# Patient Record
Sex: Female | Born: 1955 | Race: White | Hispanic: No | Smoking: Former smoker
Health system: Southern US, Community
[De-identification: ages and names within clinical notes are randomized; demographics above are authoritative.]

## PROBLEM LIST (undated history)

## (undated) DIAGNOSIS — I4819 Other persistent atrial fibrillation: Secondary | ICD-10-CM

## (undated) DIAGNOSIS — N390 Urinary tract infection, site not specified: Secondary | ICD-10-CM

## (undated) DIAGNOSIS — G2581 Restless legs syndrome: Secondary | ICD-10-CM

## (undated) DIAGNOSIS — I10 Essential (primary) hypertension: Secondary | ICD-10-CM

## (undated) DIAGNOSIS — E7800 Pure hypercholesterolemia, unspecified: Secondary | ICD-10-CM

## (undated) DIAGNOSIS — I340 Nonrheumatic mitral (valve) insufficiency: Secondary | ICD-10-CM

## (undated) DIAGNOSIS — Z8489 Family history of other specified conditions: Secondary | ICD-10-CM

## (undated) DIAGNOSIS — I2510 Atherosclerotic heart disease of native coronary artery without angina pectoris: Secondary | ICD-10-CM

## (undated) DIAGNOSIS — N189 Chronic kidney disease, unspecified: Secondary | ICD-10-CM

## (undated) DIAGNOSIS — I341 Nonrheumatic mitral (valve) prolapse: Secondary | ICD-10-CM

## (undated) DIAGNOSIS — N809 Endometriosis, unspecified: Secondary | ICD-10-CM

## (undated) HISTORY — PX: US ECHOCARDIOGRAPHY: HXRAD669

## (undated) HISTORY — PX: KNEE SURGERY: SHX244

## (undated) HISTORY — DX: Other persistent atrial fibrillation: I48.19

## (undated) HISTORY — PX: LAPAROSCOPY: SHX197

## (undated) HISTORY — DX: Restless legs syndrome: G25.81

## (undated) HISTORY — DX: Nonrheumatic mitral (valve) insufficiency: I34.0

## (undated) HISTORY — PX: APPENDECTOMY: SHX54

## (undated) HISTORY — DX: Endometriosis, unspecified: N80.9

## (undated) HISTORY — PX: CHOLECYSTECTOMY: SHX55

## (undated) HISTORY — PX: ABDOMINAL HYSTERECTOMY: SHX81

## (undated) HISTORY — DX: Urinary tract infection, site not specified: N39.0

---

## 1997-12-14 ENCOUNTER — Emergency Department (HOSPITAL_COMMUNITY): Admission: EM | Admit: 1997-12-14 | Discharge: 1997-12-14 | Payer: Self-pay | Admitting: Emergency Medicine

## 1998-03-26 ENCOUNTER — Ambulatory Visit (HOSPITAL_BASED_OUTPATIENT_CLINIC_OR_DEPARTMENT_OTHER): Admission: RE | Admit: 1998-03-26 | Discharge: 1998-03-26 | Payer: Self-pay | Admitting: Orthopedic Surgery

## 1998-08-20 ENCOUNTER — Ambulatory Visit (HOSPITAL_BASED_OUTPATIENT_CLINIC_OR_DEPARTMENT_OTHER): Admission: RE | Admit: 1998-08-20 | Discharge: 1998-08-20 | Payer: Self-pay | Admitting: Orthopedic Surgery

## 2001-12-05 ENCOUNTER — Ambulatory Visit (HOSPITAL_COMMUNITY): Admission: RE | Admit: 2001-12-05 | Discharge: 2001-12-05 | Payer: Self-pay | Admitting: Family Medicine

## 2001-12-05 ENCOUNTER — Encounter: Payer: Self-pay | Admitting: Family Medicine

## 2002-12-17 ENCOUNTER — Encounter: Payer: Self-pay | Admitting: Emergency Medicine

## 2002-12-17 ENCOUNTER — Emergency Department (HOSPITAL_COMMUNITY): Admission: EM | Admit: 2002-12-17 | Discharge: 2002-12-17 | Payer: Self-pay | Admitting: Emergency Medicine

## 2003-09-11 ENCOUNTER — Ambulatory Visit (HOSPITAL_BASED_OUTPATIENT_CLINIC_OR_DEPARTMENT_OTHER): Admission: RE | Admit: 2003-09-11 | Discharge: 2003-09-11 | Payer: Self-pay | Admitting: Orthopedic Surgery

## 2003-10-11 ENCOUNTER — Encounter: Admission: RE | Admit: 2003-10-11 | Discharge: 2004-01-09 | Payer: Self-pay | Admitting: Family Medicine

## 2004-09-01 ENCOUNTER — Encounter: Admission: RE | Admit: 2004-09-01 | Discharge: 2004-09-01 | Payer: Self-pay | Admitting: Family Medicine

## 2004-09-04 ENCOUNTER — Encounter: Admission: RE | Admit: 2004-09-04 | Discharge: 2004-09-04 | Payer: Self-pay | Admitting: Family Medicine

## 2004-09-09 ENCOUNTER — Encounter: Admission: RE | Admit: 2004-09-09 | Discharge: 2004-09-09 | Payer: Self-pay | Admitting: Family Medicine

## 2006-09-08 ENCOUNTER — Ambulatory Visit: Payer: Self-pay | Admitting: Cardiology

## 2006-09-08 LAB — CONVERTED CEMR LAB
AST: 28 units/L (ref 0–37)
Albumin: 4.4 g/dL (ref 3.5–5.2)
Alkaline Phosphatase: 84 units/L (ref 39–117)
BUN: 8 mg/dL (ref 6–23)
CO2: 31 meq/L (ref 19–32)
Calcium: 10.4 mg/dL (ref 8.4–10.5)
Chloride: 102 meq/L (ref 96–112)
Creatinine, Ser: 1.2 mg/dL (ref 0.4–1.2)
Glucose, Bld: 121 mg/dL — ABNORMAL HIGH (ref 70–99)
Hgb A1c MFr Bld: 5.7 % (ref 4.6–6.0)
MCHC: 33.6 g/dL (ref 30.0–36.0)
Potassium: 4 meq/L (ref 3.5–5.1)
RBC: 5.04 M/uL (ref 3.87–5.11)
RDW: 12.7 % (ref 11.5–14.6)
TSH: 1.25 microintl units/mL (ref 0.35–5.50)
Total Bilirubin: 0.8 mg/dL (ref 0.3–1.2)
Total CHOL/HDL Ratio: 7.3
Triglycerides: 409 mg/dL (ref 0–149)
VLDL: 82 mg/dL — ABNORMAL HIGH (ref 0–40)

## 2006-09-16 ENCOUNTER — Ambulatory Visit: Payer: Self-pay

## 2006-09-16 ENCOUNTER — Encounter: Payer: Self-pay | Admitting: Cardiology

## 2006-09-20 ENCOUNTER — Ambulatory Visit: Payer: Self-pay | Admitting: Cardiology

## 2006-09-20 LAB — CONVERTED CEMR LAB: Pro B Natriuretic peptide (BNP): 16 pg/mL (ref 0.0–100.0)

## 2006-10-03 ENCOUNTER — Ambulatory Visit: Payer: Self-pay | Admitting: Cardiology

## 2006-10-03 ENCOUNTER — Ambulatory Visit: Payer: Self-pay | Admitting: Critical Care Medicine

## 2006-10-03 ENCOUNTER — Inpatient Hospital Stay (HOSPITAL_COMMUNITY): Admission: AD | Admit: 2006-10-03 | Discharge: 2006-10-06 | Payer: Self-pay | Admitting: Cardiology

## 2006-10-05 ENCOUNTER — Encounter: Payer: Self-pay | Admitting: Cardiology

## 2006-10-10 ENCOUNTER — Ambulatory Visit: Payer: Self-pay | Admitting: Cardiology

## 2006-10-28 ENCOUNTER — Ambulatory Visit: Payer: Self-pay | Admitting: Cardiology

## 2006-10-28 ENCOUNTER — Emergency Department (HOSPITAL_COMMUNITY): Admission: EM | Admit: 2006-10-28 | Discharge: 2006-10-28 | Payer: Self-pay | Admitting: Emergency Medicine

## 2006-11-07 ENCOUNTER — Ambulatory Visit: Payer: Self-pay | Admitting: Cardiology

## 2006-12-07 ENCOUNTER — Encounter: Admission: RE | Admit: 2006-12-07 | Discharge: 2006-12-07 | Payer: Self-pay | Admitting: Neurology

## 2006-12-08 ENCOUNTER — Ambulatory Visit: Payer: Self-pay | Admitting: Cardiology

## 2006-12-23 ENCOUNTER — Ambulatory Visit: Payer: Self-pay | Admitting: Cardiology

## 2006-12-23 LAB — CONVERTED CEMR LAB
Albumin: 4.1 g/dL (ref 3.5–5.2)
Alkaline Phosphatase: 76 units/L (ref 39–117)
Bilirubin, Direct: 0.1 mg/dL (ref 0.0–0.3)
LDL Cholesterol: 111 mg/dL — ABNORMAL HIGH (ref 0–99)
Total CHOL/HDL Ratio: 3.5

## 2007-06-04 IMAGING — CR DG CHEST 2V
2 series · 2 of 2 positions shown · non-contrast
Comparison: 10/03/06.

CLINICAL DATA: 50 year old female; shortness of breath, dizziness, lightheadedness and chest pain.
2-VIEW CHEST RADIOGRAPH ? 10/28/06:

[w chest pa]
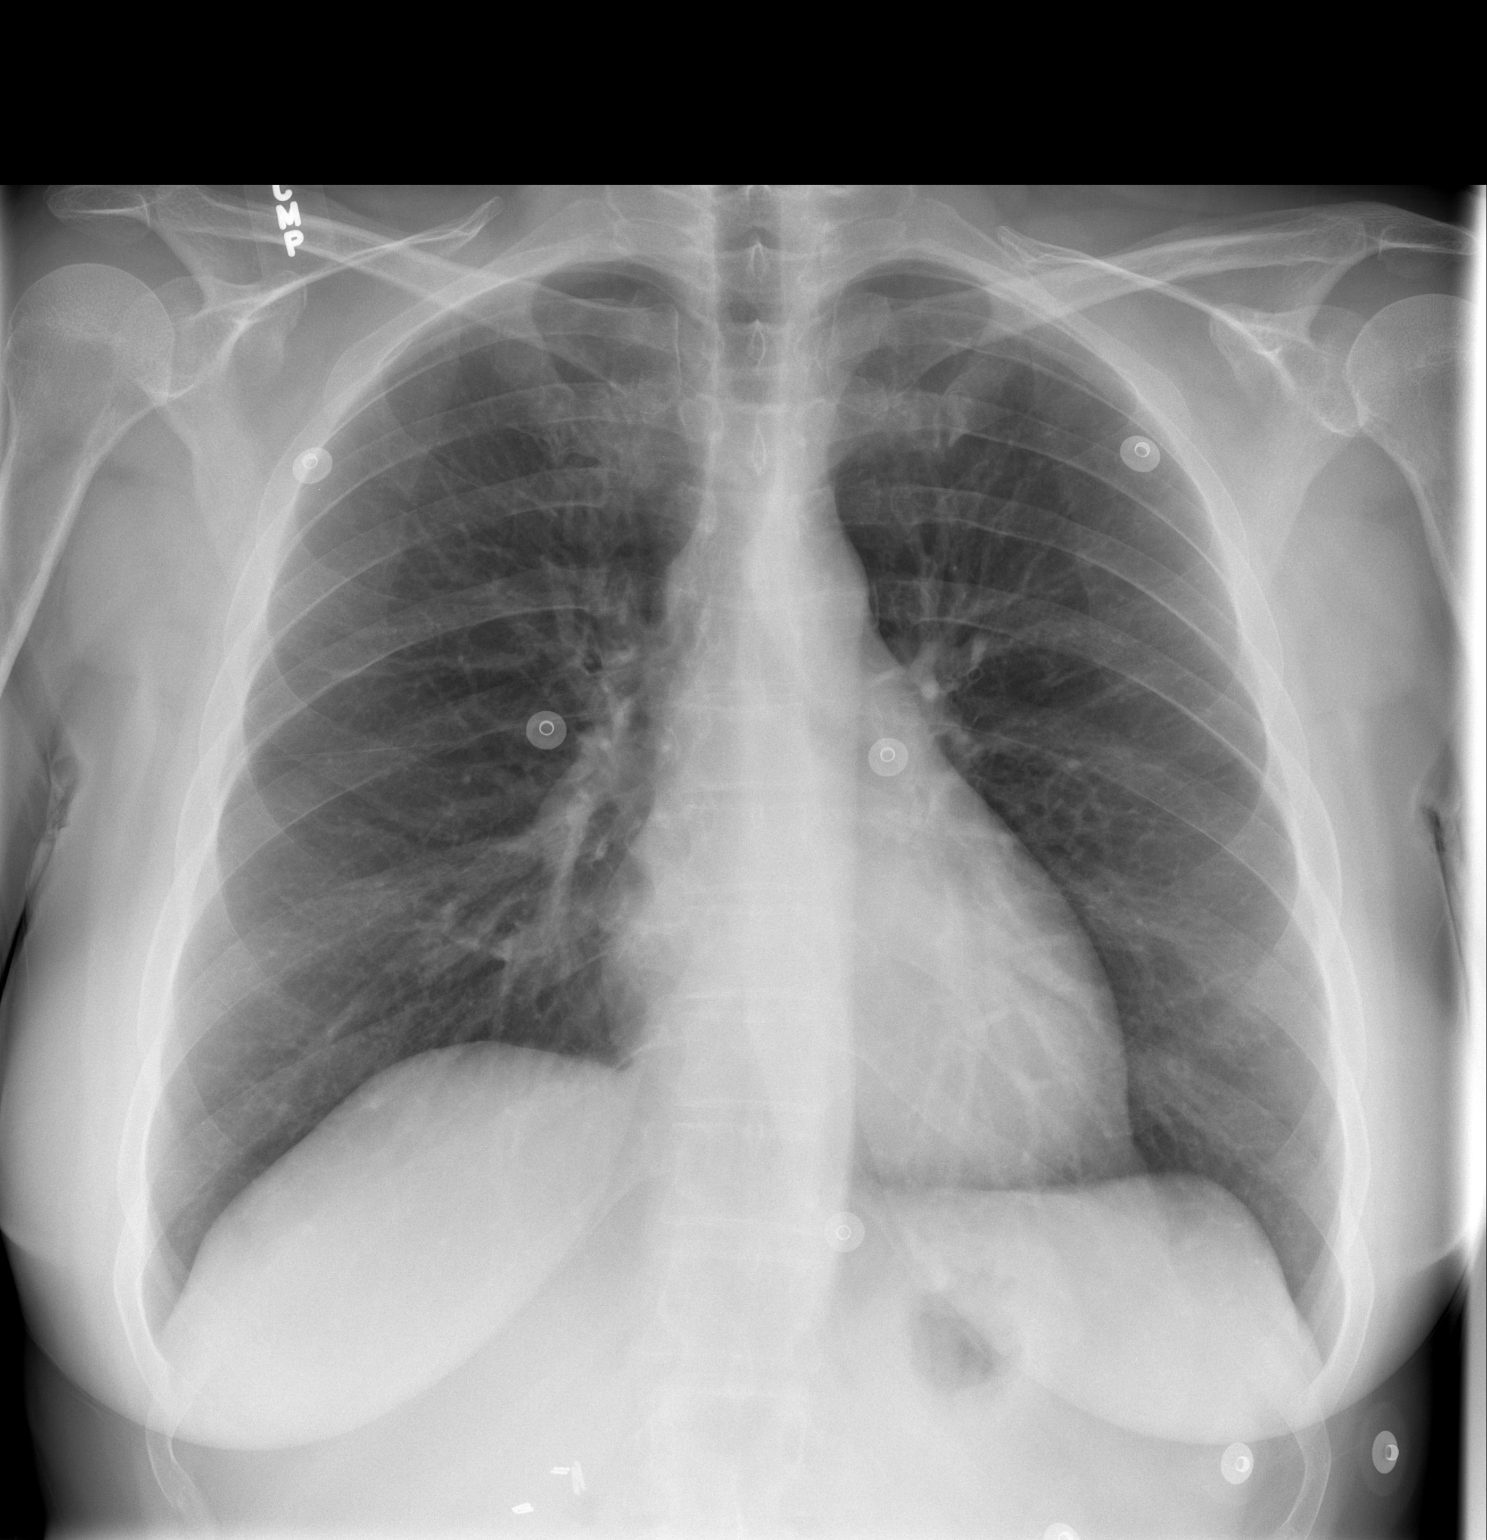

[w chest lat]
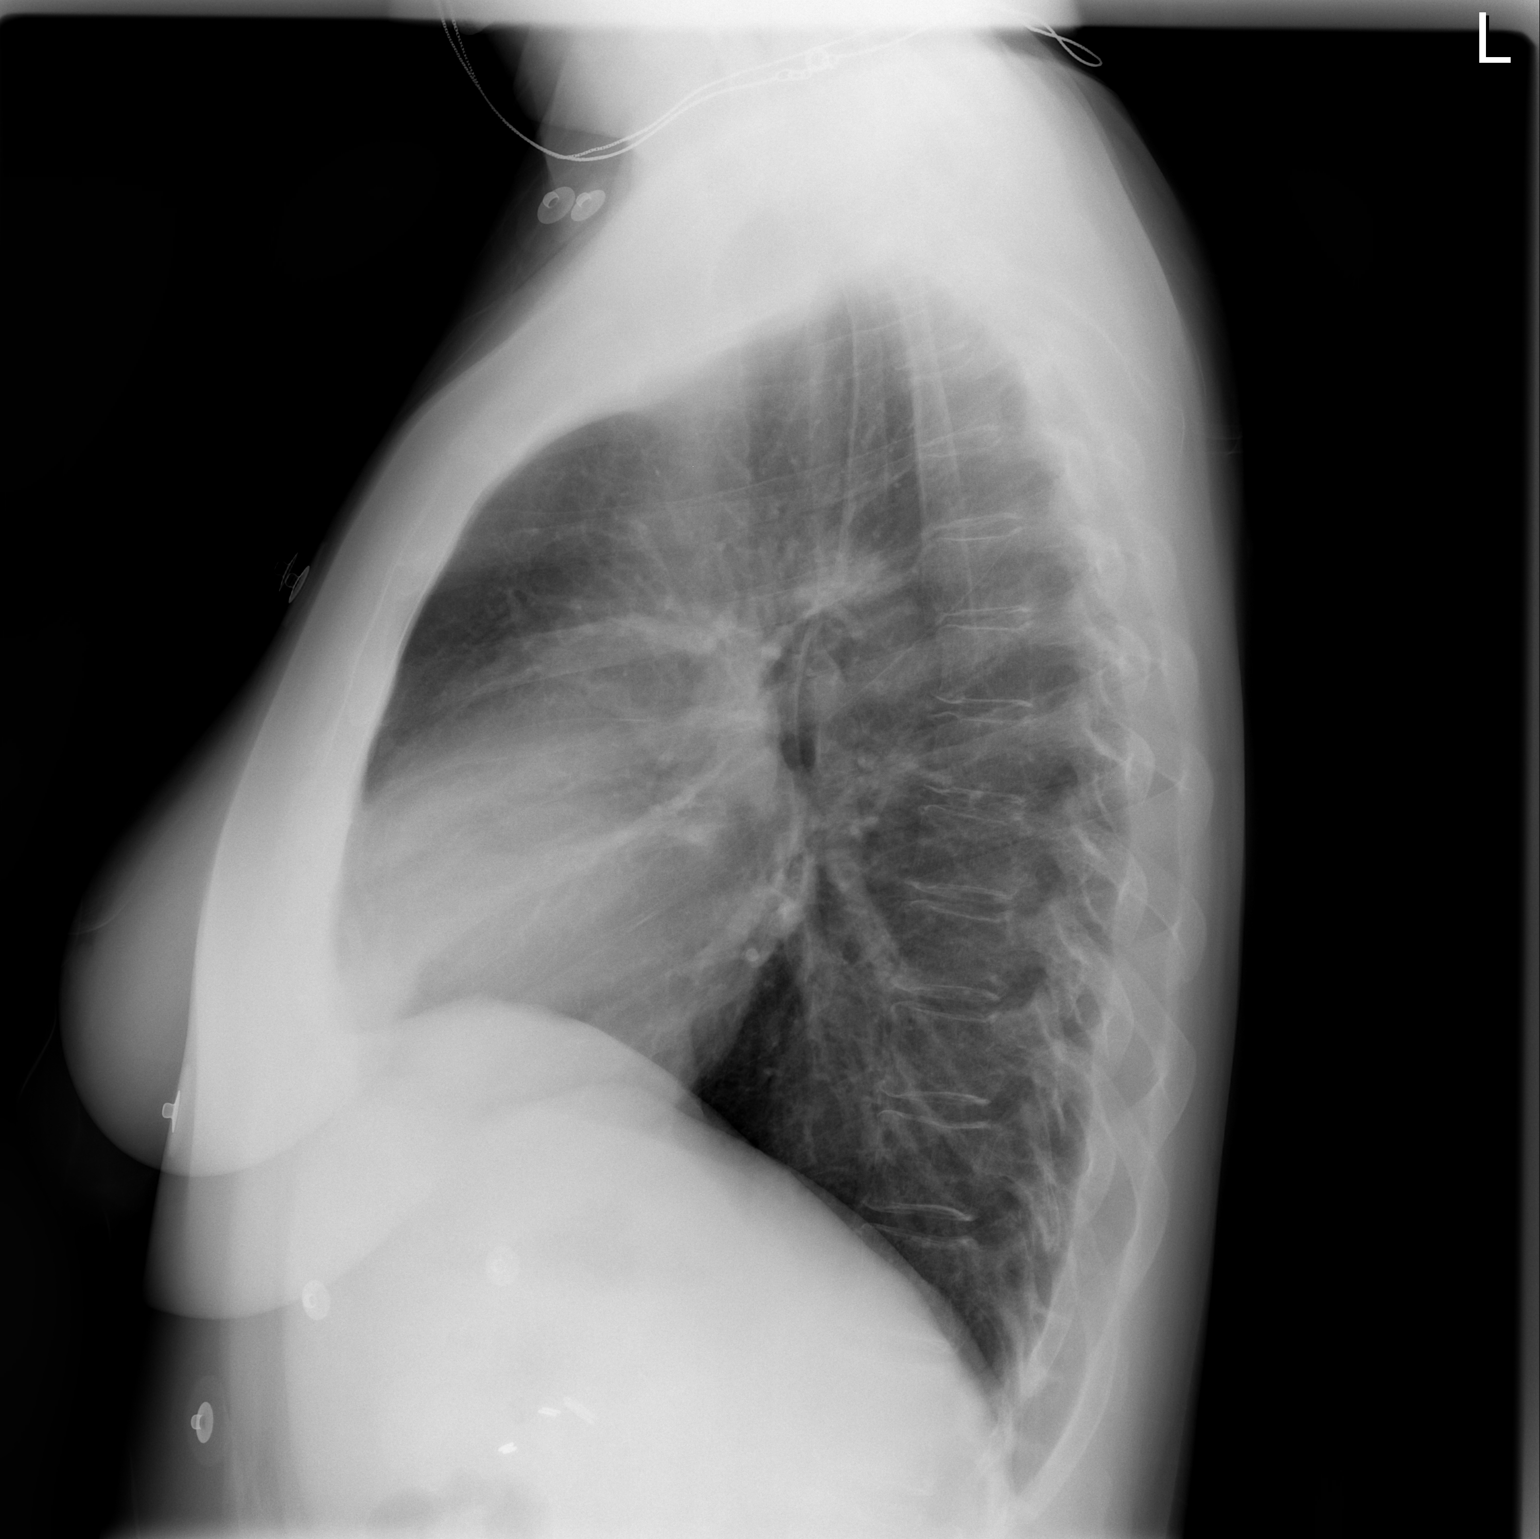

[2 of 2 positions shown; findings below may reference images not displayed]

FINDINGS: The heart size and mediastinal contours are within normal limits.  Both lungs are clear.  The visualized skeletal structures are within normal limits.
IMPRESSION: No active cardiopulmonary disease.

## 2009-10-28 ENCOUNTER — Ambulatory Visit: Payer: Self-pay | Admitting: Cardiology

## 2009-10-28 ENCOUNTER — Ambulatory Visit: Payer: Self-pay | Admitting: Cardiovascular Disease

## 2009-10-28 ENCOUNTER — Encounter: Payer: Self-pay | Admitting: Cardiology

## 2009-10-28 ENCOUNTER — Ambulatory Visit: Payer: Self-pay

## 2009-10-28 ENCOUNTER — Telehealth: Payer: Self-pay | Admitting: Cardiology

## 2009-10-28 ENCOUNTER — Ambulatory Visit (HOSPITAL_COMMUNITY): Admission: RE | Admit: 2009-10-28 | Discharge: 2009-10-28 | Payer: Self-pay | Admitting: Cardiology

## 2009-10-28 ENCOUNTER — Telehealth (INDEPENDENT_AMBULATORY_CARE_PROVIDER_SITE_OTHER): Payer: Self-pay | Admitting: *Deleted

## 2009-10-28 DIAGNOSIS — I2510 Atherosclerotic heart disease of native coronary artery without angina pectoris: Secondary | ICD-10-CM | POA: Insufficient documentation

## 2009-10-28 DIAGNOSIS — R072 Precordial pain: Secondary | ICD-10-CM | POA: Insufficient documentation

## 2009-10-28 DIAGNOSIS — I059 Rheumatic mitral valve disease, unspecified: Secondary | ICD-10-CM | POA: Insufficient documentation

## 2009-11-12 ENCOUNTER — Ambulatory Visit: Payer: Self-pay | Admitting: Cardiology

## 2009-11-13 ENCOUNTER — Telehealth: Payer: Self-pay | Admitting: Cardiology

## 2010-01-09 ENCOUNTER — Emergency Department (HOSPITAL_COMMUNITY): Admission: EM | Admit: 2010-01-09 | Discharge: 2010-01-09 | Payer: Self-pay | Admitting: Emergency Medicine

## 2010-09-24 NOTE — Progress Notes (Signed)
Summary: med question  Phone Note Call from Patient Call back at [phone]   Caller: Patient Reason for Call: Talk to Nurse Summary of Call: does she need to be on nitroglycerine, if so [facility] [address] Initial call taken by: Migdalia Dk,  November 13, 2009 10:46 AM    New/Updated Medications: NITROSTAT 0.4 MG SUBL (NITROGLYCERIN) as directed Prescriptions: NITROSTAT 0.4 MG SUBL (NITROGLYCERIN) as directed  #25 x 4   Entered by:   Scherrie Bateman, LPN   Authorized by:   Gaylord Shih, MD, [facility]   Signed by:   Scherrie Bateman, LPN on 45/40/9811   Method used:   [facility]        [facility]  [address] [phone]* (retail)       [address]       [place], [place]  [id number]       Ph: [phone] or [phone]       Fax: [phone]   RxID:   [id number]

## 2010-09-24 NOTE — Assessment & Plan Note (Signed)
Summary: [facility]   History of Present Illness: Ms Buis comes in today as an adult evaluation of chest pain, shortness of breath, and a history of mitral valve prolapse.  She has had this since this past Friday, 5 days ago. She called her primary care this morning and they would not see her. She called me and we worked her in. Quite frankly, she has not been to see her primary care doctor or me in several years.  She is not taking any of her medications because she lost her insurance. She did not consult anybody about generic equivalents.  She began having a substernal chest aching off and on since Friday. She wakes up he is gasping for breath. This is very similar to what she's had in the past.  She denies palpitations, orthopnea, classic PND or peripheral edema.  She is very emotional today. She says she has not slept in days.  She has a history of nonobstructive coronary disease by catheterization a couple years ago.  Allergies: 1)  ! Penicillin  Review of Systems       negative other than history of present illness  Vital Signs:  Patient profile:   55 year old female Pulse rate:   80 / minute BP sitting:   166 / 100  Physical Exam  General:  emotional, teary, red injected eyes. Head:  normocephalic and atraumatic Eyes:  injected sclera Neck:  Neck supple, no JVD. No masses, thyromegaly or abnormal cervical nodes. Lungs:  Clear bilaterally to auscultation and percussion. Heart:  regular rate and rhythm, no systolic murmur consistent with prolapse Msk:  Back normal, normal gait. Muscle strength and tone normal. Pulses:  pulses normal in all 4 extremities Extremities:  No clubbing or cyanosis. Neurologic:  Alert and oriented x 3. Skin:  Intact without lesions or rashes. Psych:  Normal affect.   Problems:  Medical Problems Added: 1)  Dx of Cad, Native Vessel  (ICD-414.01)  EKG  Procedure date:  10/28/2009  Findings:      normal sinus rhythm,  normal EKG  Impression & Recommendations:  Problem # 1:  CHEST PAIN, PRECORDIAL (ICD-786.51) Assessment Deteriorated  This sounds like her presentation in the past. Some of this may be prolapse but I doubt is coronary. Her EKG is normal.  I'll repeat 2-D echocardiogram. With her history of prolapse, and her blood pressure being high today, we'll start metoprolol succinate 50 mg a day.  Her updated medication list for this problem includes:    Metoprolol Succinate 50 Mg Xr24h-tab (Metoprolol succinate) .Marland Kitchen... 1 once daily  Problem # 2:  MITRAL VALVE PROLAPSE (ICD-424.0) Assessment: Unchanged  Her updated medication list for this problem includes:    Metoprolol Succinate 50 Mg Xr24h-tab (Metoprolol succinate) .Marland Kitchen... 1 once daily  Problem # 3:  CAD, NATIVE VESSEL (ICD-414.01) Assessment: Unchanged  Her updated medication list for this problem includes:    Metoprolol Succinate 50 Mg Xr24h-tab (Metoprolol succinate) .Marland Kitchen... 1 once daily

## 2010-09-24 NOTE — Progress Notes (Signed)
Summary: chest tightness  Phone Note Call from Patient Call back at Home Phone [phone]   Caller: Patient Reason for Call: Talk to Nurse Summary of Call: chest tightness for the last several days, has not been taking meds for the last 69yrs; states its not a emergency just to have the nurse call her back Initial call taken by: Migdalia Dk,  October 28, 2009 9:22 AM  Follow-up for Phone Call        I spoke with the pt and she first tried to get an appt with her PCP but they could not see her.  This pt was last seen by Dr Daleen Squibb in 2008.  The pt called c/o chest tightness since Friday that comes and goes.  The pt feels like this is angina related to her valve.   The pt rates this tightness as a 4/10 and "uncomfortable".  The tightness is in the center of her chest and left breast. The pt has "no significant SOB".  The pt has not had any medication in the past 2 years and  has been under a lot of stress.  I will discuss this pt with Dr Daleen Squibb.  Follow-up by: Julieta Gutting, RN, BSN,  October 28, 2009 9:33 AM  Additional Follow-up for Phone Call Additional follow up Details #1::        I spoke with Dr Daleen Squibb and he would like the pt to come into the office for an EKG.  If the pt's EKG is okay then we will schedule an appt for the pt see Dr Daleen Squibb. Pt agrees with plan.  Julieta Gutting, RN, BSN  October 28, 2009 9:53 AM  See nurse visit note.  Additional Follow-up by: Julieta Gutting, RN, BSN,  October 28, 2009 12:31 PM

## 2010-09-24 NOTE — Progress Notes (Signed)
  Phone Note Outgoing Call   Call placed by: Scherrie Bateman, LPN,  October 28, 2009 1:48 PM Call placed to: Patient Summary of Call: DR WALL WISHES TO START METOPROLOL SUCC. 50 MG DAILY FOR PALPITATIONS AND ELEVATED B/P. LEFT MESSAGE FOR PT TO CALL BACK TO INFORM OF ABOVE PLAN. Initial call taken by: Scherrie Bateman, LPN,  October 28, 2009 1:49 PM  Follow-up for Phone Call        pt returning call, Migdalia Dk  October 28, 2009 1:53 PM  PT AWARE OF NEW  MED PER PT HAS APPT WITH PMD NEXT FRI . Follow-up by: Scherrie Bateman, LPN,  October 28, 2009 3:20 PM    New/Updated Medications: METOPROLOL SUCCINATE 50 MG XR24H-TAB (METOPROLOL SUCCINATE) 1 once daily Prescriptions: METOPROLOL SUCCINATE 50 MG XR24H-TAB (METOPROLOL SUCCINATE) 1 once daily  #30 x 11   Entered by:   Scherrie Bateman, LPN   Authorized by:   Gaylord Shih, MD, [facility]   Signed by:   Scherrie Bateman, LPN on 16/05/9603   Method used:   [facility] [phone]* (retail)       [address]       [place], [place]  [id number]       Ph: [phone] or [phone]       Fax: [phone]   RxID:   [id number]

## 2010-09-24 NOTE — Assessment & Plan Note (Signed)
Summary: rov c/o chest pain ./cy   Allergies: 1)  ! Penicillin

## 2010-09-24 NOTE — Assessment & Plan Note (Signed)
Summary: chest tightness  Nurse Visit   Vital Signs:  Patient profile:   55 year old female Weight:      177 pounds Pulse rate:   71 / minute BP sitting:   162 / 100  (left arm) Cuff size:   regular  Vitals Entered By: Julieta Gutting, RN, BSN (October 28, 2009 12:15 PM)  Patient Instructions: 1)  Your physician recommends that you schedule a follow-up appointment with your PCP for BP management and 2 WEEKS with Dr Daleen Squibb 2)  Your physician has requested that you have an echocardiogram.  Echocardiography is a painless test that uses sound waves to create images of your heart. It provides your doctor with information about the size and shape of your heart and how well your heart's chambers and valves are working.  This procedure takes approximately one hour. There are no restrictions for this procedure.   Allergies (verified): 1)  ! Penicillin  Orders Added: 1)  EKG w/ Interpretation [93000] 2)  Echocardiogram [Echo] The pt is currently not taking any prescription or OTC medications. Julieta Gutting, RN, BSN  October 28, 2009 3:43 PM  Appended Document: chest tightness  Reviewed Juanito Doom, MD

## 2010-09-24 NOTE — Assessment & Plan Note (Signed)
Summary: 2WK SL   Visit Type:  2 wk f/u Primary Provider:  Mila Palmer  CC:  sob when laying at times...denies any edema or cp.  History of Present Illness: Bonnie Ryan comes back in for close followup. Please see my note from a couple weeks ago.  Her 2-D echocardiogram showed bileaflet prolapse with mild mitral regurgitation. She had mild left atrial enlargement. Left ventricular function was normal. Left ventricular chamber size measured normal.  I started on metoprolol for her chest pain, shortness of breath, and prolapse. She has a history of nonobstructive coronary disease as well. She was off all her medications. She was advised to see her primary care physician and get back on meds.  She is now back on Vytorin for severe hyperlipidemia which is worked in the past, she is taking metformin for her glucose intolerance, but has not start an aspirin. She is taking metoprolol as instructed. She feels remarkably better. She's having no significant discomfort now and no shortness of breath.  Current Medications (verified): 1)  Vytorin 10-40 Mg Tabs (Ezetimibe-Simvastatin) .Marland Kitchen.. 1 Tab At Bedtime 2)  Metformin Hcl 500 Mg Tabs (Metformin Hcl) .Marland Kitchen.. 1 Tab Two Times A Day 3)  Metoprolol Succinate 50 Mg Xr24h-Tab (Metoprolol Succinate) .Marland Kitchen.. 1 Tab Once Daily  Allergies: 1)  ! Penicillin  Review of Systems       negative other than history of present illness  Vital Signs:  Patient profile:   55 year old female Height:      67 inches Weight:      177 pounds BMI:     27.82 Pulse rate:   72 / minute Pulse rhythm:   regular BP sitting:   120 / 80  (left arm) Cuff size:   large  Vitals Entered By: Danielle Rankin, CMA (November 12, 2009 4:50 PM)  Physical Exam  General:  obese.  obese.   Head:  normocephalic and atraumatic Eyes:  PERRLA/EOM intact; conjunctiva and lids normal. Neck:  Neck supple, no JVD. No masses, thyromegaly or abnormal cervical nodes. Chest Bonnie Ryan:  no deformities or  breast masses noted Lungs:  Clear bilaterally to auscultation and percussion. Heart:  PMI nondisplaced, normal S1-S2, mitral valve click, soft systolic murmur at the apex. Msk:  Back normal, normal gait. Muscle strength and tone normal. Pulses:  pulses normal in all 4 extremities Extremities:  No clubbing or cyanosis. Neurologic:  Alert and oriented x 3. Skin:  Intact without lesions or rashes. Psych:  much calmer today, alert oriented x3   Impression & Recommendations:  Problem # 1:  CAD, NATIVE VESSEL (ICD-414.01) Assessment Unchanged I spent a long time talking about symptoms of angina or acute coronary syndrome and how to respond. I strongly recommended secondary preventative strategies including her medications. After starting aspirin a day. I'll see her back in a year. She will follow blood work with her primary care. The following medications were removed from the medication list:    Metoprolol Succinate 50 Mg Xr24h-tab (Metoprolol succinate) .Marland Kitchen... 1 once daily Her updated medication list for this problem includes:    Metoprolol Succinate 50 Mg Xr24h-tab (Metoprolol succinate) .Marland Kitchen... 1 tab once daily  Problem # 2:  CHEST PAIN, PRECORDIAL (ICD-786.51) Assessment: Improved  The following medications were removed from the medication list:    Metoprolol Succinate 50 Mg Xr24h-tab (Metoprolol succinate) .Marland Kitchen... 1 once daily Her updated medication list for this problem includes:    Metoprolol Succinate 50 Mg Xr24h-tab (Metoprolol succinate) .Marland Kitchen... 1 tab once daily  Problem # 3:  MITRAL VALVE PROLAPSE (ICD-424.0) Assessment: Unchanged I reviewed the results of the echocardiogram with her thoroughly. All questions answered. No changes in treatment. The following medications were removed from the medication list:    Metoprolol Succinate 50 Mg Xr24h-tab (Metoprolol succinate) .Marland Kitchen... 1 once daily Her updated medication list for this problem includes:    Metoprolol Succinate 50 Mg Xr24h-tab  (Metoprolol succinate) .Marland Kitchen... 1 tab once daily  Patient Instructions: 1)  Your physician recommends that you schedule a follow-up appointment in: YEAR WITH DR Bonnie Ryan 2)  Your physician discussed the risks, benefits and indications for preventive aspirin therapy. It is recommended that you start (or continue) taking 325 mg of aspirin a day.

## 2010-11-09 LAB — POCT CARDIAC MARKERS
CKMB, poc: <1 ng/mL — ABNORMAL LOW (ref 1.0–8.0)
Myoglobin, poc: 56.6 ng/mL (ref 12–200)
Troponin i, poc: <0.05 ng/mL (ref 0.00–0.09)

## 2010-11-09 LAB — POCT I-STAT, CHEM 8
Creatinine, Ser: 0.9 mg/dL (ref 0.4–1.2)
Hemoglobin: 14.6 g/dL (ref 12.0–15.0)
TCO2: 24 mmol/L (ref 0–100)

## 2010-11-09 LAB — CBC
HCT: 41.3 % (ref 36.0–46.0)
MCHC: 35 g/dL (ref 30.0–36.0)
Platelets: 142 10*3/uL — ABNORMAL LOW (ref 150–400)
WBC: 6.3 10*3/uL (ref 4.0–10.5)
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
## 2011-01-08 NOTE — Discharge Summary (Signed)
NAMEDESIREY, KEAHEY             ACCOUNT NO.:  [id number]   MEDICAL RECORD NO.:  [id number]          PATIENT TYPE:  INP   LOCATION:  2029                         FACILITY:  [facility]   PHYSICIAN:  Jesse Sans. Wall, MD, FACCDATE OF BIRTH:  11-13-1955   DATE OF ADMISSION:  10/03/2006  DATE OF DISCHARGE:  10/06/2006                               DISCHARGE SUMMARY   PROCEDURES:  1. Cardiac catheterization.  2. Coronary arteriogram.  3. Left ventriculogram.  4. Transesophageal echocardiogram.   PRIMARY DIAGNOSIS:  Presyncope.   SECONDARY DIAGNOSES:  1. 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  2. Symptomatic palpitations.  3. Orthopnea.  4. Tobacco use.  5. Mixed hyperlipidemia  6. Type 2 diabetes  7. Status post appendectomy, laparoscopic abdominal surgery x4, left      knee arthroscopy, total hysterectomy, cholecystectomy.   Time of discharge 33 minutes.   HOSPITAL COURSE:  Ms. Kettlewell is a 55 year old female with no previous  history of coronary artery disease.  She is known to Dr. Daleen Squibb.  She was  evaluated in the office last on September 20, 2006.  She developed  increased episodes of dizziness, presyncope and shortness of breath and  came to the hospital where she was admitted for further evaluation and  treatment.   Her TSH, LFTs and CBC were all normal.  Her chest x-ray was without  acute disease.  There was concern for an ischemic cause of her symptoms,  and she had a heart catheterization which showed a RCA 25% in the mid  section and 25% in the distal section.  The LAD had a proximal calcified  area with a long 30% stenosis.  There was no significant disease, and  her EF was 65% with mild anterolateral hypokinesis.  Dr. Antoine Poche felt  that she had nonobstructive CAD, but her left ventricular wall motion  was abnormal.  A TEE was felt indicated for mitral valve  prolapse, and  this was scheduled for October 05, 2006.   The TEE showed mild hypokinesis of the mid anterior wall with an EF of  55%.  There was mild atherosclerosis in the descending aorta.  Her  aortic valve was normal, and her mitral valve was myxomatous with  bileaflet prolapse, anterior greater than posterior and mild to moderate  MR as well as trace TR but no IAFC by color.  A pulmonary consult was  called.   She was seen by Dr. Delford Field who doubted that the dyspnea was her primary  pulmonary issue.  He recommended PFTs and outpatient follow-up.   On October 06, 2006, she was evaluated by Dr. Daleen Squibb who felt that  further workup could be performed as an outpatient.  She was encouraged  to discontinue tobacco use.  She was considered stable for discharge  with outpatient follow-up arranged.   DISCHARGE INSTRUCTIONS:  Her activity level is to be increased  gradually.  She is to call our office for any problems with the  catheterization site.  She is to follow up with Dr. Daleen Squibb, and our office  will call her.  She is to follow up with Dr. Delford Field on a p.r.n. basis.   DISCHARGE MEDICATIONS:  1. Vytorin 10/0 q.p.m.  2. Aspirin 325 mg a day.  3. Sublingual nitroglycerin p.r.n.      Theodore Demark, PA-C      Jesse Sans. Daleen Squibb, MD, [facility]  Electronically Signed    RB/MEDQ  D:  10/06/2006  T:  10/06/2006  Job:  [id number]   cc:   Charlcie Cradle. Delford Field, MD, FCCP

## 2011-01-08 NOTE — Assessment & Plan Note (Signed)
 HEALTHCARE                            CARDIOLOGY OFFICE NOTE   NAME:Round, RIN GORTON                    MRN:          [id number]  DATE:09/20/2006                            DOB:          [birth year]    Ms. Boesen returns today to discuss the results of her studies.  Please  see my note from September 08, 2006.  Metoprolol extended release has not  helped.  I was thinking that she was having a lot more ectopy lying  down, that she may be increasing her effective mitral regurgitation and  prolapse.   Her echocardiogram did show severe bileaflet mitral valve prolapse.  The  anterior leaflet is greater than the posterior leaflet.  However, the  mitral regurgitation is only mild.  Her ejection fraction was 60-65%.  She has mild left atrial enlargement.   I just had Dr. Gala Romney review that echo, and he agrees.  The valve  does bow quite a bit, however.   Her blood work showed a hemoglobin of 15.9, which is elevated probably  from tobacco use.  Her comprehensive metabolic panel was normal, except  for a non-fasting blood sugar of 121.  Her hemoglobin A1c was 5.7%.  TSH  was 1.25 and her lipids showed severe mixed hyperlipidemia despite a  strict diet with a total cholesterol of 345, triglycerides of 409, a  VLDL of 82, LDL of 240.5.   She is clearly having remarkable symptoms.  She is not sleeping at all.  She is very anxious even to lie down.   EXAM:  Really, unchanged.   I have spoken to Dr. Gala Romney today.  We will check a BNP to see what  her filling pressures are.  If this is high, this probably suggests that  her wedge pressure is high and her atrial pressures are high, making the  prolapse a real problem.  We also will obtain a CPX study to see if  there are any major ventilation perfusion issues.  She exercises on a  regular basis and is fairly asymptomatic.   Depending on these studies, we will decide which way to go.  She may  need a  TEE to see if her prolapse is a lot worse than the transthoracic  demonstrates.   I asked her to stop the metoprolol extended release because it does not  seem to be helping.   In regard to her mixed hyperlipidemia, I placed her on Vytorin 10/40.  We will put her in for blood work in 6 weeks.    Thomas C. Daleen Squibb, MD, [facility]  Electronically Signed   TCW/MedQ  DD: 09/20/2006  DT: 09/20/2006  Job #: [id number]

## 2011-01-08 NOTE — Consult Note (Signed)
Bonnie Ryan, Bonnie Ryan             ACCOUNT NO.:  [id number]   MEDICAL RECORD NO.:  [id number]          PATIENT TYPE:  INP   LOCATION:  2029                         FACILITY:  [facility]   PHYSICIAN:  Rollene Rotunda, MD, FACCDATE OF BIRTH:  1956-01-26   DATE OF CONSULTATION:  DATE OF DISCHARGE:                                 CONSULTATION   PRIMARY CARE PHYSICIAN:  Primary care physician is none.  Cardiologist  is Jesse Sans. Wall, M.D.   PROCEDURE:  Left heart catheterization, coronary arteriography.   INDICATIONS:  Evaluate patient with chest pain and dyspnea and an EKG  with anterior T-wave inversions suggestive of anterior wall ischemia.   DESCRIPTION OF PROCEDURE:  Left heart catheterization was performed via  the right femoral artery.  The artery was cannulated using anterior wall  puncture.  A #6-French arterial sheath was inserted via the modified  Seldinger technique.  Preformed Judkins and a pigtail catheter were  utilized.  The patient tolerated the procedure well and left the lab in  stable condition.   RESULTS:  Hemodynamics: LV 112/14, AO 113/86.  Coronaries: The left main  was normal.  The LAD was proximally calcified.  There is a long 30%  stenosis after a first diagonal and involving a small septal perforator.  There were three small diagonals which were normal.  The circumflex in  the AV groove was normal.  There was a ramus intermediate which was  small and normal.  First obtuse marginal was small and normal.  Obtuse  marginal 3 and obtuse marginal 4 were moderate to large sized vessels  and normal.  There was a small posterolateral which was normal.  The  right coronary artery is a dominant vessel.  It had mid long 25%  stenosis.  There was distal 25% stenosis before the PDA.  The PDA was  moderate sized and normal.  Posterolateral was small and normal.   Left ventriculogram:  The left ventriculogram was obtained in the RAO  projection.  The EF was approximately 60  to 65% with mid anterior mild  to moderate hypokinesis.   CONCLUSIONS:  Nonobstructive coronary disease.  There is calcified  plaque in the proximal left anterior descending, however.  This  correlates with a regional wall motion abnormality on left  ventriculogram.  This also is consistent with the EKG changes.  It is  suspicious for the patient having had an out of hospital ischemic event,  though she has negative enzymes.  Overall her left ventricular function  is well-preserved.   PLAN:  I have discussed this with Dr. Daleen Squibb.  The patient also has mitral  valve prolapse and significant dyspnea.  She is going to get a TE to  evaluate this and any degree of mitral regurgitation.  She needs very  aggressive risk reduction and we will discuss this with the patient.      Rollene Rotunda, MD, [facility]  Electronically Signed    JH/MEDQ  D:  10/04/2006  T:  10/04/2006  Job:  [id number]

## 2011-01-08 NOTE — Assessment & Plan Note (Signed)
[facility]                            CARDIOLOGY OFFICE NOTE   NAME:Dimino, VENUS                      MRN:          [id number]  DATE:11/07/2006                            DOB:          [birth year]    Ms. Owen returns today.  She had a similar event to what she had  prior to her catheterization on October 28, 2006.  She went to the  emergency room.  Her chief complaint was presyncope.   She saw Dr. Charlies Constable who felt like this was probably related to  increased stress as well as her mitral valve prolapse.  There was no  objective evidence to admit her.  He put her on Toprol-XL which we had  already tried.   She comes in today extremely depressed and tearful.  She has got an  appointment with neurology, with Dr. Orlin Hilding, the first week of April.  She says she is taking her Vytorin and aspirin.  She is down to a couple  of cigarettes a day.   Her blood pressure today is 112/90, her pulse is 88 and regular, her  weight is 162, down 3.  The rest of her exam is unchanged.   I spent about 20 minutes talking to her today.  I have asked her to  continue with Vytorin and aspirin and to see Dr. Orlin Hilding as scheduled in  April.  I will see her shortly thereafter.  If there is nothing  objective neurologically to explain her symptomatology, we will continue  to treat her aggressively with risk factor modification and support her  from an emotional standpoint.  She is quite open to this.     Thomas C. Daleen Squibb, MD, [facility]  Electronically Signed    TCW/MedQ  DD: 11/07/2006  DT: 11/08/2006  Job #: [id number]

## 2011-01-08 NOTE — H&P (Signed)
Bonnie Ryan, Bonnie Ryan             ACCOUNT NO.:  [id number]   MEDICAL RECORD NO.:  [id number]          PATIENT TYPE:  INP   LOCATION:  2029                         FACILITY:  [facility]   PHYSICIAN:  Jesse Sans. Wall, MD, FACCDATE OF BIRTH:  1956-01-28   DATE OF ADMISSION:  10/03/2006  DATE OF DISCHARGE:                              HISTORY & PHYSICAL   CHIEF COMPLAINT:  Episodes of dizziness, presyncope, and shortness of  breath.   HISTORY OF PRESENT ILLNESS:  Bonnie Ryan is a delightful 55 year old  white female whom we have evaluated in the office numerous times in the  past.  I last saw her on September 20, 2006.   PROBLEM LIST:  1. Moderate mitral valve prolapse, with mild to moderate mitral      regurgitation.  She has had this since she was a teenager.  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  She had mild left atrial dilatation.  This      was reviewed by Dr. Jens Som and Dr. Gala Romney.  2. Symptomatic palpitations.  3. Profound shortness of breath with immediately lying down to the      point where she cannot sleep.  4. Tobacco use.  5. Mixed hyperlipidemia.  Cholesterol 345, triglycerides 409, VLDL of      82, LDL of 240.  We recently started her on Vytorin 10/40, which      she has finally agreed to take.  6. Type 2 diabetes, managed with diet.  Her hemoglobin A1c was 5.7,      however.   Over the last couple of days, she has had several episodes of standing  up, feeling lightheaded all of a sudden, feeling presyncopal, and  becoming short of breath.  These spells can last as long as 30 minutes.  She denies any tachypalpitations at the time.  She is also still having  problems lying down, getting  short of breath.  I had reviewed this with  Dr. Gala Romney at a recent visit, and we obtained a BNP to see if she had  any increase intracardiac pressures.  It was actually normal at 16.  We  have her scheduled for a CPX test to see if she has significant mitral  valve prolapse.  We also talked about doing a TEE.   She comes to the office today with these complaints.  Her EKG now shows  some new ST segment and T wave changes in the anterior leads.  She has a  large R wave in V2, and she also has ST segment depression with T wave  inversion in III and aVF.   PAST MEDICAL HISTORY:  She has no known drug allergies.   She is currently on Vytorin 10/40.   She exercises on a  regular basis.  She is very careful with her diet,  she says.   She has had an appendectomy, laparoscopic x4, arthroscopy of the left  knee, total hysterectomy at 55 years of age, and a cholecystectomy.   FAMILY HISTORY:  Unremarkable.   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  She is single.   REVIEW OF SYSTEMS:  Other than HPI, is really negative.   PHYSICAL EXAMINATION:  GENERAL:  She is very pleasant, but she is very  emotional and teary today.  She says she is weak and feels like she is  complaining and embarrassing herself.  VITAL SIGNS:  Blood pressure 130/90, pulse 62 and regular.  EKG shows no  PVCs.  Her weight is 166.  HEENT:  Normocephalic, atraumatic.  PERRLA.  Extraocular movements  intact.  Her conjunctivae are injected, as are her sclerae.  Her eyes  are puffy.  Facial symmetry is normal.  Dentition satisfactory.  NECK:  Supple.  Carotid upstrokes are equal bilaterally, without bruits.  There is no JVD.  Thyroid is not enlarged.  Trachea is midline.  LUNGS:  Clear, with a few expiratory wheezes.  HEART:  Reveals a normal S1, S2, without murmur, rub, or gallop.  There  is no loud murmur.  ABDOMEN:  Soft.  EXTREMITIES:  Revealed no edema.  Pulses are intact.   NEUROLOGIC:  Intact.  SKIN:  Intact and normal.   ASSESSMENT:  1. Episodes of shortness of breath, presyncope, and multiple cardiac      risk factors, new EKG changes.  Rule out obstructive coronary      disease or unstable ischemic syndrome.  2. Severe hyperlipidemia.  3. Mitral valve prolapse, with question of more severity, with change      in position.  4. History of tachypalpitations.  5. Tobacco use.  6. Multiple surgeries.  7. History of type 2 diabetes, well controlled on diet.   PLAN:  I have had a long talk with Bonnie Ryan.  We will admit her to  the hospital for observation and cardiac catheterization.  We will also  place her on telemetry and watch for any rhythm disorders.  Will check  baseline labs.   She understands the indications, risks, potential benefits, and reason  to proceed.  If her coronaries are normal, I still feel she needs a TEE  to evaluate her mitral valve further and also may need a pulmonary/sleep  consultation.      Thomas C. Daleen Squibb, MD, [facility]  Electronically Signed     TCW/MEDQ  D:  10/03/2006  T:  10/04/2006  Job:  [id number]

## 2011-01-08 NOTE — Op Note (Signed)
NAMEMilon Dikes NO.:  [id number]   MEDICAL RECORD NO.:  [id number]                   PATIENT TYPE:  AMB   LOCATION:  DSC                                  FACILITY:  [facility]   PHYSICIAN:  Harvie Junior, M.D.                DATE OF BIRTH:  1956/05/04   DATE OF PROCEDURE:  09/11/2003  DATE OF DISCHARGE:                                 OPERATIVE REPORT   PREOPERATIVE DIAGNOSIS:  Medial meniscal tear, right, with question about  anterior cruciate ligament tear, although KT1000 preoperative testing side  to side was symmetric and MRI showed good fibers through the anterior  cruciate ligament area, there was some question about anterior cruciate  ligament insertion on the femoral side, but certainly appeared to be fibrous  in that area.   POSTOPERATIVE DIAGNOSIS:  1. Medial meniscal tear.  2. Osteochondral injury, patella.   SURGEON:  Harvie Junior, M.D.   ASSISTANT:  None.   ANESTHESIA:  General.   BRIEF HISTORY:  She is a 55 year old female with a long history of having  right knee pain.  She has had some right knee pain off and on from a  previous arthroscopy several years ago.  She says she has times where she  felt like she was unstable in changing or turning directions and because of  continued complaints of right knee pain, she was brought to the operating  room for operative knee arthroscopy.  Preoperatively, I thought she was  loose on her right knee compared to her left.  She did have the history of  feeling that she gave out and changing directions.  I thought she had a  pivot glide on her right side.  I thought she had a similar finding on her  left side, however.  I sent her for a KT-1000 preoperatively and did this at  the [facility] and showed a symmetric finding bilaterally.  I  reviewed her MRI which showed fibers certainly going well in the area of the  ACL, some difficult to see at the femoral side insertion, but  certainly  through that area looked fine.  She is taken to the operating room for  operative knee arthroscopy.   DESCRIPTION OF PROCEDURE:  The patient was taken to the operating room and  after anesthesia was obtained with general anesthetic, the patient was  placed on the operating table.  The right leg was examined under anesthesia  and noted to have a solid end point bilaterally.  There may be a little bit  more translation on the right side than the left but a very solid end point.  A pivot glide on the right side was all we could get with her asleep, not a  real pivot shift.  On the left side, pivot glide similar and I did not feel  any difference with her asleep in  terms of her pivot glide pivot shift test  side-to-side.  At this point, the right leg was prepped and draped in the  usual sterile fashion and routine arthroscopic examination revealed there  was an obvious buckle and tear in the medial meniscus.  This was debrided  with the straight biting forceps and right hand side biting forceps back to  a smooth and stable rim.  A probe was used to insure there was no superior  flap.  Upon introducing the scope into the knee, there was fairly  significant grade 3 osteochondral defect in the patellofemoral joint and  this was debrided in this area.  The cannula could be passed back and forth  easily underneath the patella.   Attention was turned to the ACL which initially was noted to look like a  normal ACL. With stressing and Lachman testing, it tightened and pulled  forward.  We used a shaver to shave off some of the synovium around the ACL.  The femoral attachment, again, did not look not perfect, however, at 0  degrees, 30 degrees, 60 degrees, and 90 degrees, the fibers tightened and  just felt that with no change in the pivot shift test side to side and even  though she had a slightly more positive Lachman on the right than the left,  there was certainly a solid end point and  you could see this with the ACL  interoperatively.  I felt that this would not be the right thing to do an  ACL on her.  Attention was turned to the lateral compartment.  The lateral  meniscus was fine.  The lateral femoral condyle was fine.  Attention was  turned back to the patellofemoral compartment where we debrided the under  surface of the patella and one final check was made medially and a probe was  used throughout the entirety of the medial femoral condyle to make sure  there was no loose or softening cartilage and there were none.  At this  point, the knee was copiously irrigated and suctioned dry.  The arthroscopic  portals were closed with a bandage.  A sterile compressive dressing was  applied.  The patient was taken to the recovery room and was noted to be in  satisfactory condition.  Estimated blood loss for the procedure was none.                                               Harvie Junior, M.D.    Ranae Plumber  D:  09/11/2003  T:  09/11/2003  Job:  [id number]

## 2011-01-08 NOTE — Assessment & Plan Note (Signed)
[facility]                            CARDIOLOGY OFFICE NOTE   SHAARON, GOLLIDAY                     MRN:          [id number]  DATE:09/08/2006                            DOB:          05-Mar-1956    [name] returns today after a nearly 5-year absence from the  practice.   PROBLEM LIST:  1. Includes moderate mitral valve prolapse with mild to moderate      mitral regurgitation.  She has had this ever since she was a      teenager.  Her last 2D echocardiogram was 2005, which was stable.      She also had some mild asymmetric left ventricular hypertrophy with      no significant gradient.  2. Symptomatic palpitations.  3. Normal left ventricular systolic function.  4. Tobacco use, which she is still using.  5. Mixed hyperlipidemia.  6. Type 2 diabetes, poorly controlled.  She is noncompliant, except      with diet and exercise, and does not take her medicines.   She has been having a whole lot of problems with getting short of breath  when she lies down.  She sometimes will be asleep and jump up and gasp  for air for a few seconds.  She feels some heaviness in her chest at  times.  Sometimes, she notes some palpitations with this.   She has also had spells where her right middle finger becomes numb and  turns yellow.  She complains of some visual disturbance in the past and  some paresthesias in her hands, and we did a carotid Doppler study,  which was negative.  She denies any headache, other neurological  symptoms, visual changes.   She said recently, at a Christmas party, she had her whole right arm go  numb and limp.  It took several minutes in the bathroom for this to get  better.  There were no other neurological symptoms at that time.   She is out of all of her medications.  She used to take metformin and  Byetta, prescribed by Dr. Lanae Ryan.  Dr. Lanae Ryan left [place], and so she has  not been back.   She does exercise on a regular  basis and does very well with cardio.  She denies any pre-syncope or syncope, any tachy palpitations when she  exercises.  She denies any true orthopnea, PND, or peripheral edema.   EXAM:  Blood pressure is 120/80, pulse 83, and when I laid her down, she  was having short gasps of air.  At the same time, she was in ventricular  bigeminy by auscultation.  Remarkably, her EKG showed sinus rhythm with  no PVCs.  Her PR, QRS, and QTc are normal.  Her weight is 169, down from  185 in April of 2005.  HEENT:  She looks very tired.  Her eyes are bloodshot.  PERRLA.  Extraocular movements intact.  Sclerae clear.  Facial symmetry is  normal.  Dentition is satisfactory.  Carotid upstrokes are equal bilaterally without bruits.  There is no  JVD.  Thyroid is not enlarged.  Trachea is midline.  There is no  lymphadenopathy.  Neck is supple.  LUNGS:  No wheezes or rhonchi.  Her PMI is nondisplaced.  She has a diminuted S1 with a mid systolic  classic mitral valve prolapse click and murmur.  S2 splits  physiologically.  She has no gallop.  ABDOMEN:  Soft with good bowel sounds.  There is no midline bruit.  There is no hepatomegaly.  EXTREMITIES:  No cyanosis, clubbing, or edema.  Pulses are intact.  NEUROLOGIC:  Grossly intact.  SKIN:  Intact.  ORTHOPEDIC:  Intact.   I have had about a 30 minute discussion with Bonnie Ryan today.  She is  clearly having symptoms that cannot be explained, unless there is some  sort of bizarre association with her mitral valve prolapse.  This was  also true when we saw her back in the early 2000s.   I do think that she is having symptomatic PVCs and has ventricular  bigeminy when she was lying down, which caused her to be short of breath  or gasp for air.  Her effective heart rate was probably running about 35  to 40 at that time.   These seem to be suppressed with exercise.  She does very well with  cardio.   She still smokes, has hyperlipidemia, and untreated  diabetes.  She is at  high risk of having a vascular event, as I told her today.   PLAN:  1. A 2D echocardiogram to follow up on left ventricular function and      mitral valve prolapse.  2. Comprehensive metabolic panel, hemoglobin A1C, TSH, CBC, and      lipids.  3. Begin metoprolol extended release 25 mg a day.  We may have to      increase this to 50 mg a day.  Hope this will symptomatically      improve her.  4. Have her return in a couple weeks to discuss these findings and to      implement preventative therapy for vascular disease, which is the      most likely thing that is going to hurt her.     Thomas C. Daleen Squibb, MD, [facility]  Electronically Signed    TCW/MedQ  DD: 09/08/2006  DT: 09/08/2006  Job #: [id number]

## 2011-01-08 NOTE — Assessment & Plan Note (Signed)
[facility]                            CARDIOLOGY OFFICE NOTE   NAME:Bonnie Ryan, Bonnie Ryan                    MRN:          [id number]  DATE:10/10/2006                            DOB:          02-01-1956    [name] returns today for further management of the following  issues:  1. Mitral valve prolapse, anterior greater than posterior by TEE, with      mild to moderate mitral regurgitation.  2. Symptomatic palpitations.  3. Orthopnea, poorly explained, questionably related to #1.  4. History of tobacco use, which she seems to be quitting.  5. Severe mixed hyperlipidemia, currently on Vytorin.  6. Nonobstructive coronary artery disease.  She presented with EKG      changes and symptoms of potential coronary ischemia.      Catheterization showed a 30% stenosis with a calcified area in the      LAD.  She had an EF of 65% with an anterolateral wall motion      abnormality.   She has been having problems with her toes turning white while she was  watching the [place] yesterday.  She also has problems with her  right middle finger turning white, which will last about an hour.  There  is no blue or red discoloration.  There are no other symptoms other than  some tingling.  She is on Vytorin 10/40 and aspirin 325 mg a day.   She is still sleeping poorly and is having to sleep with her head up.  She was seen by Pulmonary Medicine during her hospitalization and they  really did not feel like this was any significant apnea or other  breathing disorder.   PHYSICAL EXAMINATION:  Her blood pressure today is 110/64.  Pulse is 60  and regular.  Weight is 165.  HEENT:  Normocephalic, atraumatic.  PERRLA.  Conjunctivae are injected.  She looks tired.  Facial symmetry is normal.  Dentition is satisfactory.  NECK:  Carotid upstrokes are equal bilaterally without bruits.  There is  no JVD.  Thyroid is not enlarged.  Trachea is midline.  LUNGS:  Clear.  HEART:   Regular rate and rhythm with a soft systolic murmur.  ABDOMEN:  Soft.  Right groin shows superficial ecchymoses down to the  perineum.  There is no bruit.  Pulses are good distally.  Her toes are normal in color with reduced  capillary refill.  NEUROLOGIC:  Intact.   I spent another 20 minutes or so talking to [name] today.  The  fatigue may be a generalized fact from not sleeping and just stress and  worry.  It could also be exacerbated by the Vytorin.  I have asked her  to stop this for the next 2 weeks.  If there is no improvement in her  symptoms, I have asked her to go back on it.  I will see her back in 4  weeks.   I have tried to reassure her that her mitral valve is stable and has  been carefully looked at.  Hopefully, it will not need repair for a  number of years.  She will need annual echocardiograms.   She will stay on aspirin.  I have encouraged her to get back into an  exercise program and a regular lifestyle.     Thomas C. Daleen Squibb, MD, [facility]  Electronically Signed    TCW/MedQ  DD: 10/10/2006  DT: 10/11/2006  Job #: [id number]

## 2011-01-08 NOTE — Consult Note (Signed)
NAME:  Bonnie Ryan, Bonnie Ryan             ACCOUNT NO.:  [id number]   MEDICAL RECORD NO.:  [id number]          PATIENT TYPE:  EMS   LOCATION:  MAJO                         FACILITY:  [facility]   PHYSICIAN:  Bonnie Beals. Juanda Chance, MD, FACCDATE OF BIRTH:  [birth year]   DATE OF CONSULTATION:  DATE OF DISCHARGE:  10/28/2006                                 CONSULTATION   PRIMARY CARDIOLOGIST:  Bonnie Ryan.   PRIMARY CARE PHYSICIAN:  None.   CHIEF COMPLAINT:  Presyncope.   HPI:  Bonnie Ryan is a 55 year old female with a history of mitral valve  prolapse and nonobstructive coronary artery disease by cath in February  of 2008.  Today, she woke up complaining of slight general malaise and  stated that she did not feel right.  She restarted some and when she  got up out of bed and walked 3 steps, she had sudden onset of dizziness  that was associated with presyncope and weakness.  She felt her heart  pounding and also had some chest tightness.  She laid down and took  sublingual nitroglycerin, which may or may not have helped.  She called  her son and initially stated that she felt she needed transport to the  doctor's office, but then decided that she felt better and they went out  to eat.  In the restaurant, her symptoms became worse again and she came  to the emergency room.  She is currently symptom free.  She has chronic  orthostatic dizziness that she states lasts only for a second or two and  does not make her feel that she is going to loose consciousness or fall.  These symptoms are the same symptoms for which she was hospitalized in  February of 2008 and had a heart catheterization and a TEE.   PAST MEDICAL HISTORY:  1. Status post cardiac catheterization February of 2008 showing an      ejection fraction of 35%, left anterior descending 30%, right      coronary artery 25%.  2. Status post transesophageal echocardiography February of 2008      showing mitral valve prolapse, anterior greater than  posterior with      mild to moderate mitral regurgitation and a myxomatous valve.  3. History of tachy palpitations.  4. Hyperlipidemia.  5. Diet controlled diabetes.  6. Chronic orthopnea.   PAST SURGICAL HISTORY:  1. She is status post right knee surgery.  2. Appendectomy.  3. Laparoscopic abdominal surgery x4.  4. Cholecystectomy.  5. Appendectomy.   ALLERGIES:  PENICILLIN.   CURRENT MEDICATIONS:  1. Vytorin 10/10 daily.  2. Aspirin 325 mg daily.   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  She has a greater than 30 pack year history of tobacco  use, but states that she is only smoking 5-6 cigarettes a day generally.  She denies alcohol abuse, but per son she has gone through 2 fifths of  Vodka this week, although that is unusual for her.  She denies drug use.   FAMILY HISTORY:  Her parents are both  alive, in their 4s, and neither  one has heart disease.  She has no heart disease in any of her siblings.   REVIEW OF SYSTEMS:  She initially denies anxiety or depression, but  admits to increased stress recently secondary to a breakup with her  boyfriend.  She has some chronic arthralgias.  She denies reflux  symptoms, indigestion, heartburn, melena.  The dyspnea on exertion and  orthopnea is chronic and has not changed recently.  The presyncope is  described above.  She has occasional tachy palpitations.  Review of  systems is, otherwise, negative.   PHYSICAL EXAMINATION:  VITAL SIGNS:  She is afebrile.  Blood pressure is  133/90.  Pulse 66.  Respiratory rate 19,  O2 saturation 99% on room air.  GENERAL:  She is a well-developed, well-nourished white female who is  crying at times during the interview, but denies anxiety.  HEENT:  Her head is normocephalic, atraumatic with extraocular movements  intact.  Sclerae are clear.  Nares without discharge.  NECK:  There is no  lymphadenopathy, thyromegaly, bruit or JVD noted.  CV:   Her heart is regular in rate and rhythm with an S1, S2 and a soft  diastolic murmur is noted at the apex.  Her distal pulses are 2+ in all  4 extremities.  LUNGS:  Are essentially clear to auscultation bilaterally.  SKIN:  No rashes or lesions are noted.  ABDOMEN:  Soft and nontender with active bowel sounds and no  hepatosplenomegaly upon palpation.  EXTREMITIES:  There is no cyanosis, clubbing or edema noted.  MUSCULOSKELETAL:  There is no joint deformities, effusions and no spine  or CVA tenderness.  NEURO:  She is alert and oriented.  Cranial nerves II-XII grossly  intact.   EKG is sinus rhythm, rate 74 with nonspecific T-wave changes.   IMPRESSION:  Dizziness/presyncope and chest tightness:  She has known  mitral valve prolapse and nonobstructive coronary artery disease.  Her  symptoms may be secondary to increased stress, as well as mitral valve  prolapse.  Bonnie Ryan last office note states that she had not been  sleeping well and fatigue may play a factor as well.  She has not had a  neurologic evaluation for her symptoms and one has been set up.  She  will see Bonnie Ryan on April 3.  She is to follow up with Bonnie Ryan on  March 17.  She was given a prescription for Toprol-XL 25 mg daily, 30  with 3 refills,  as well as Xanax, short term prescription with no refills only.  Ms.  Ryan is encouraged to keep all followup appointments and was also  advised that she should not drive.  She is also encouraged to obtain a  primary care physician.  She is requested to limit alcohol to 1 ounce  per day and stop smoking completely.      Bonnie Demark, PA-C      Bonnie R. Juanda Chance, MD, [facility]  Electronically Signed    RB/MEDQ  D:  10/28/2006  T:  10/29/2006  Job:  [id number]

## 2011-01-08 NOTE — Consult Note (Signed)
Bonnie Ryan, Bonnie Ryan             ACCOUNT NO.:  [id number]   MEDICAL RECORD NO.:  [id number]          PATIENT TYPE:  INP   LOCATION:  2029                         FACILITY:  [facility]   PHYSICIAN:  Charlcie Cradle. Delford Field, MD, FCCPDATE OF BIRTH:  Jun 26, 1956   DATE OF CONSULTATION:  10/03/2006  DATE OF DISCHARGE:                                 CONSULTATION   CHIEF COMPLAINT:  Evaluate dyspnea.   HISTORY OF PRESENT ILLNESS:  A 55 year old white female who has had  dyspnea for the past several months is more orthopneic that she cannot  lay flat without getting dyspneic occasionally on exertion.  She has no  cough, no excess mucus production.  Question of apneas at night.  Symptoms have progressed, and she was admitted for elective cardiac  catheterization.  Cardiac cath showed minimal nonobstructive coronary  artery disease with a mild to moderate hypokinesis of the mid anterior  wall.  Ejection fraction though was 65%.  It is unclear how much of this  could be related to her dyspnea.  She is still actively smoking.   PAST MEDICAL HISTORY:  Positive for hypercholesterolemia only.   MEDICATIONS PRIOR TO ADMISSION:  Vytorin daily.   PHYSICAL EXAMINATION:  VITAL SIGNS:  Temperature 98, blood pressure  137/87, pulse 59, respirations 18, saturation 100%.  CHEST:  Clear without evidence of wheeze or rhonchi.  CARDIAC EXAM:  Showed a regular rate and rhythm without S3.  Normal S1,  S2.  ABDOMEN:  Soft, nontender.  EXTREMITIES:  Showed no edema or clubbing.  SKIN:  Clear.   Chest x-ray showed no active disease or process.   IMPRESSION:  Dyspnea, unclear etiology.  Doubt primary pulmonary.  Will  check full pulmonary function studies.  The __________  causes of this  dyspnea could be from her LV wall motion abnormalities since the cardiac  cath is not very impressive.      Charlcie Cradle Delford Field, MD, [facility]  Electronically Signed     PEW/MEDQ  D:  10/04/2006  T:  10/05/2006  Job:  [id number]   cc:   Jesse Sans. Wall, MD, [facility]

## 2011-02-12 ENCOUNTER — Other Ambulatory Visit: Payer: Self-pay | Admitting: Cardiology

## 2011-07-22 ENCOUNTER — Encounter: Payer: Self-pay | Admitting: Nurse Practitioner

## 2011-07-22 ENCOUNTER — Emergency Department (HOSPITAL_COMMUNITY): Payer: Managed Care, Other (non HMO)

## 2011-07-22 ENCOUNTER — Inpatient Hospital Stay (HOSPITAL_COMMUNITY)
Admission: EM | Admit: 2011-07-22 | Discharge: 2011-07-23 | DRG: 303 | Disposition: A | Discharge status: Home / Self Care | Payer: Managed Care, Other (non HMO) | Attending: Cardiology | Admitting: Cardiology

## 2011-07-22 DIAGNOSIS — Z7982 Long term (current) use of aspirin: Secondary | ICD-10-CM

## 2011-07-22 DIAGNOSIS — Z880 Allergy status to penicillin: Secondary | ICD-10-CM

## 2011-07-22 DIAGNOSIS — I059 Rheumatic mitral valve disease, unspecified: Secondary | ICD-10-CM | POA: Diagnosis present

## 2011-07-22 DIAGNOSIS — R079 Chest pain, unspecified: Secondary | ICD-10-CM

## 2011-07-22 DIAGNOSIS — R072 Precordial pain: Secondary | ICD-10-CM | POA: Insufficient documentation

## 2011-07-22 DIAGNOSIS — E119 Type 2 diabetes mellitus without complications: Secondary | ICD-10-CM | POA: Diagnosis present

## 2011-07-22 DIAGNOSIS — R0609 Other forms of dyspnea: Secondary | ICD-10-CM | POA: Diagnosis present

## 2011-07-22 DIAGNOSIS — R609 Edema, unspecified: Secondary | ICD-10-CM | POA: Diagnosis present

## 2011-07-22 DIAGNOSIS — R0989 Other specified symptoms and signs involving the circulatory and respiratory systems: Secondary | ICD-10-CM | POA: Diagnosis present

## 2011-07-22 DIAGNOSIS — Z79899 Other long term (current) drug therapy: Secondary | ICD-10-CM

## 2011-07-22 DIAGNOSIS — F17200 Nicotine dependence, unspecified, uncomplicated: Secondary | ICD-10-CM | POA: Diagnosis present

## 2011-07-22 DIAGNOSIS — E7800 Pure hypercholesterolemia, unspecified: Secondary | ICD-10-CM | POA: Diagnosis present

## 2011-07-22 DIAGNOSIS — I2510 Atherosclerotic heart disease of native coronary artery without angina pectoris: Principal | ICD-10-CM | POA: Diagnosis present

## 2011-07-22 DIAGNOSIS — R0600 Dyspnea, unspecified: Secondary | ICD-10-CM

## 2011-07-22 DIAGNOSIS — R0789 Other chest pain: Secondary | ICD-10-CM | POA: Diagnosis present

## 2011-07-22 HISTORY — DX: Nonrheumatic mitral (valve) prolapse: I34.1

## 2011-07-22 HISTORY — DX: Atherosclerotic heart disease of native coronary artery without angina pectoris: I25.10

## 2011-07-22 HISTORY — DX: Pure hypercholesterolemia, unspecified: E78.00

## 2011-07-22 HISTORY — DX: Essential (primary) hypertension: I10

## 2011-07-22 LAB — POCT I-STAT, CHEM 8
BUN: 14 mg/dL (ref 6–23)
Calcium, Ion: 1.14 mmol/L (ref 1.12–1.32)
Chloride: 111 mEq/L (ref 96–112)
Creatinine, Ser: 0.8 mg/dL (ref 0.50–1.10)
Glucose, Bld: 104 mg/dL — ABNORMAL HIGH (ref 70–99)
HCT: 44 % (ref 36.0–46.0)
Hemoglobin: 15 g/dL (ref 12.0–15.0)
Potassium: 3.6 mEq/L (ref 3.5–5.1)
Sodium: 145 meq/L (ref 135–145)
TCO2: 25 mmol/L (ref 0–100)

## 2011-07-22 LAB — DIFFERENTIAL
Basophils Relative: 0 % (ref 0–1)
Eosinophils Absolute: 0.3 10*3/uL (ref 0.0–0.7)
Eosinophils Relative: 5 % (ref 0–5)
Monocytes Absolute: 0.5 10*3/uL (ref 0.1–1.0)
Monocytes Relative: 9 % (ref 3–12)
Neutro Abs: 2.7 10*3/uL (ref 1.7–7.7)

## 2011-07-22 LAB — CARDIAC PANEL(CRET KIN+CKTOT+MB+TROPI)
CK, MB: 2 ng/mL (ref 0.3–4.0)
Total CK: 129 U/L (ref 7–177)
Troponin I: <0.3 ng/mL (ref <0.30)

## 2011-07-22 LAB — CBC
MCH: 35.1 pg — ABNORMAL HIGH (ref 26.0–34.0)
Platelets: 138 10*3/uL — ABNORMAL LOW (ref 150–400)
RBC: 4.36 MIL/uL (ref 3.87–5.11)
RDW: 13.6 % (ref 11.5–15.5)

## 2011-07-22 LAB — PRO B NATRIURETIC PEPTIDE: Pro B Natriuretic peptide (BNP): 579 pg/mL — ABNORMAL HIGH (ref 0–125)

## 2011-07-22 LAB — MAGNESIUM: Magnesium: 2.2 mg/dL (ref 1.5–2.5)

## 2011-07-22 MED ORDER — ASPIRIN 325 MG PO TABS
325.0000 mg | ORAL_TABLET | Freq: Every day | ORAL | Status: DC
  Administered 2011-07-23: 325 mg via ORAL
  Filled 2011-07-22: qty 1

## 2011-07-22 MED ORDER — METOPROLOL SUCCINATE ER 50 MG PO TB24
50.0000 mg | ORAL_TABLET | Freq: Every day | ORAL | Status: DC
  Administered 2011-07-23: 50 mg via ORAL
  Filled 2011-07-22: qty 1

## 2011-07-22 MED ORDER — HYDROCHLOROTHIAZIDE 25 MG PO TABS
25.0000 mg | ORAL_TABLET | Freq: Every day | ORAL | Status: DC
  Administered 2011-07-23: 25 mg via ORAL
  Filled 2011-07-22: qty 1

## 2011-07-22 MED ORDER — EZETIMIBE-SIMVASTATIN 10-40 MG PO TABS
1.0000 | ORAL_TABLET | Freq: Every day | ORAL | Status: DC
  Filled 2011-07-22: qty 1

## 2011-07-22 NOTE — H&P (Signed)
History and Physical  Patient ID: Bonnie Ryan MRN: [id number], SOB: 1955-11-17 55 y.o. Date of Encounter: 07/22/2011, 7:49 PM  Primary Physician: Dr. Paulino Rily Primary Cardiologist: Daleen Squibb  Chief Complaint: SOB  HPI:   The patient presents for evaluation of lower extremity swelling. She has a history of mitral valve prolapse and nonobstructive coronary disease. She's had mild mitral regurgitation. She has not followed up for repeat echocardiography since the spring of 2011. She has had a history of dyspnea in the past. However, she's been more short of breath for the last 10 days. She chronically sleeps on about 5 pillows. This may be slightly worse. She's had some increased ankle edema but this is mild. She has not had any classic substernal chest discomfort though she's had some mild chest tightness. She's had nausea vomiting and diarrhea. She went to see her primary care doctor today and was referred to the emergency room. Here labs are pending. Chest x-ray showed no acute disease. EKG does demonstrate new T wave inversions compared with the previous EKG. Of note her blood pressure is elevated but she has been out of her medications. She's not been taking her anti-hyperlipidemic. She has not been taking metformin which was recently prescribed.   Past Medical History  Diagnosis Date  . Mitral valve prolapse     moderate by echo 10/2009 with mild MR  . Hypertension   . Diabetes mellitus     Previously diet-controlled  . Hypercholesteremia   . CAD (coronary artery disease)     Nonbstructive CAD by cath 09/2006 30% LAD, 25% RCA. Normal EF of 55-60% by echo 10/2009 with normal wall thickness.     Surgical History:  Past Surgical History  Procedure Date  . Appendectomy   . Knee surgery   . Abdominal hysterectomy   . Cholecystectomy      Home Meds: Medication Sig  ezetimibe-simvastatin (VYTORIN) 10-40 MG per tablet Take 1 tablet by mouth at bedtime.    hydrochlorothiazide  (HYDRODIURIL) 25 MG tablet Take 25 mg by mouth daily.    metoprolol (TOPROL-XL) 50 MG 24 hr tablet Take 50 mg by mouth daily.     Allergies:  Allergies  Allergen Reactions  . Penicillins Other (See Comments)    Convulsions     History   Social History  . Marital Status: Divorced    Spouse Name: N/A    Number of Children: N/A  . Years of Education: N/A   Occupational History  . Not on file.   Social History Main Topics  . Smoking status: Current Everyday Smoker -- 0.5 packs/day for 20 years    Types: Cigarettes  . Smokeless tobacco: Not on file  . Alcohol Use: Yes     4  . Drug Use: No  . Sexually Active: Not on file   Other Topics Concern  . Not on file   Social History Narrative  . No narrative on file     Family History  Problem Relation Age of Onset  . Hypertension Father   . Diabetes Mother     Review of Systems:  Heart burn, neck pain, diarrhea. Otherwise as stated in the HPI and negative for all other systems.  Physical Exam GENERAL:  Well appearing HEENT:  Pupils equal round and reactive, fundi not visualized, oral mucosa unremarkable NECK:  No jugular venous distention, waveform within normal limits, carotid upstroke brisk and symmetric, no bruits, no thyromegaly LYMPHATICS:  No cervical, inguinal adenopathy LUNGS:  Clear to  auscultation bilaterally BACK:  No CVA tenderness CHEST:  Unremarkable HEART:  PMI not displaced or sustained,S1 and S2 within normal limits, no S3, no S4, no clicks, no rubs, no murmurs ABD:  Flat, positive bowel sounds normal in frequency in pitch, no bruits, no rebound, no guarding, no midline pulsatile mass, no hepatomegaly, no splenomegaly EXT:  2 plus pulses throughout, no edema, no cyanosis no clubbing SKIN:  No rashes no nodules NEURO:  Cranial nerves II through XII grossly intact, motor grossly intact throughout PSYCH:  Cognitively intact, oriented to person place and time  Labs:  Pending   Radiology/Studies:  1. Dg  Chest 2 View 07/22/2011  *RADIOLOGY REPORT*  Clinical Data: Chest pain and short of breath  CHEST - 2 VIEW  Comparison: 01/09/2010  Findings: Heart is normal in size.  Lungs are clear.  No pneumothorax.  IMPRESSION: No active cardiopulmonary disease.    2. Last echo - 2D echo 10/2009 - - Left ventricle: The cavity size was mildly dilated. Wall thickness   was normal. Systolic function was normal. The estimated ejection   fraction was in the range of 55% to 60%. Wall motion was normal;   there were no regional wall motion abnormalities. Doppler parameters are consistent with abnormal left ventricular   relaxation (grade 1 diastolic dysfunction). - Mitral valve: Moderate prolapse, involving the anterior leaflet   and the posterior leaflet. Mild regurgitation. - Left atrium: The atrium was mildly dilated.   EKG: 69bpm occasional PVC's NSR with TWI III, IIII, V3-V6  A/P  1)  Chest pain: This is a typical. However, she does have some EKG changes and risk factors. She will be admitted with enzymes cycled. She should probably have stress testing if the enzymes are unremarkable. She needs risk reduction.  2) MVP:  She should have repeat echocardiography.  3)  Dyspnea:  This will be assessed with the echo and BNP.  I suspect this is tobacco related  4)  Edema:  This has been mild.  Work up will be as above  5)  Diabetes:  I will check a A1c.  She needs to follow with her primary MD and start her metformin.  6)  HTN:  She has been off her medications.  I will restart these  7)  Tobacco:  She has been educated.  Forest Becker PA-C 07/22/2011, 7:49 PM

## 2011-07-22 NOTE — ED Notes (Signed)
Pt assisted to restroom.  Pt states increases sob with activity.  Lung sounds clear.  1+ pitting edema bil.  VS wnl.

## 2011-07-22 NOTE — ED Provider Notes (Signed)
History     CSN: [id number] Arrival date & time: 07/22/2011  1:17 PM   First MD Initiated Contact with Patient 07/22/11 1853      Chief Complaint  Patient presents with  . Shortness of Breath    (Consider location/radiation/quality/duration/timing/severity/associated sxs/prior treatment) HPI Comments: Patient reports increased shortness of breath, dyspnea on exertion, orthopnea x several weeks, last night noticed swelling in her lower extremities.  Has had intermittent nausea. Was seen by her PCP today who called her cardiologist, who came to ED to see patient upon her arrival.  Denies fever, cough, recent illness.   The history is provided by the patient.    Past Medical History  Diagnosis Date  . Mitral valve prolapse     moderate by echo 10/2009 with mild MR  . Hypertension   . Diabetes mellitus     Previously diet-controlled  . Hypercholesteremia   . CAD (coronary artery disease)     Nonbstructive CAD by cath 09/2006 30% LAD, 25% RCA. Normal EF of 55-60% by echo 10/2009 with normal wall thickness.    Past Surgical History  Procedure Date  . Appendectomy   . Knee surgery   . Abdominal hysterectomy   . Cholecystectomy     Family History  Problem Relation Age of Onset  . Hypertension Father   . Diabetes Mother     History  Substance Use Topics  . Smoking status: Current Everyday Smoker -- 0.5 packs/day for 20 years    Types: Cigarettes  . Smokeless tobacco: Not on file  . Alcohol Use: Yes     4    OB History    Grav Para Term Preterm Abortions TAB SAB Ect Mult Living                  Review of Systems  All other systems reviewed and are negative.    Allergies  Penicillins  Home Medications   Current Outpatient Rx  Name Route Sig Dispense Refill  . EZETIMIBE-SIMVASTATIN 10-40 MG PO TABS Oral Take 1 tablet by mouth at bedtime.      Marland Kitchen HYDROCHLOROTHIAZIDE 25 MG PO TABS Oral Take 25 mg by mouth daily.      Marland Kitchen METOPROLOL SUCCINATE 50 MG PO TB24 Oral  Take 50 mg by mouth daily.        BP 143/90  Pulse 67  Temp(Src) 97 F (36.1 C) (Oral)  Resp 16  Ht 5\' 7"  (1.702 m)  Wt 180 lb (81.647 kg)  BMI 28.19 kg/m2  SpO2 98%  Physical Exam  Nursing note and vitals reviewed. Constitutional: She is oriented to person, place, and time. She appears well-developed and well-nourished.  HENT:  Head: Normocephalic and atraumatic.  Neck: Neck supple.  Cardiovascular: Normal rate, regular rhythm, intact distal pulses and normal pulses.   Murmur heard. Pulmonary/Chest: Breath sounds normal. No respiratory distress. She has no wheezes. She has no rales.  Neurological: She is alert and oriented to person, place, and time.    ED Course  Procedures (including critical care time)  Patient seen in ED by [facility] cardiology.    Date: 07/22/2011  Rate: 69  Rhythm: normal sinus rhythm and premature ventricular contractions (PVC)  QRS Axis: normal  Intervals: QT prolonged  ST/T Wave abnormalities: nonspecific ST/T changes and inverted t waves  Conduction Disutrbances:none  Narrative Interpretation:   Old EKG Reviewed: changes noted    Labs Reviewed  CBC - Abnormal; Notable for the following:    Hemoglobin 15.3 (*)  MCH 35.1 (*)    Platelets 138 (*)    All other components within normal limits  DIFFERENTIAL  PRO B NATRIURETIC PEPTIDE  I-STAT, CHEM 8  CARDIAC PANEL(CRET KIN+CKTOT+MB+TROPI)  MAGNESIUM   Dg Chest 2 View  07/22/2011  *RADIOLOGY REPORT*  Clinical Data: Chest pain and short of breath  CHEST - 2 VIEW  Comparison: 01/09/2010  Findings: Heart is normal in size.  Lungs are clear.  No pneumothorax.  IMPRESSION: No active cardiopulmonary disease.  Original Report Authenticated By: Donavan Burnet, M.D.     No diagnosis found.    MDM  Patient sent to ED by PCP who called [facility] cardiology prior to patient's arrival.  [facility] cardiology arrived as I was just finished interviewing patient, saw and admitted her for worsening  dyspnea, orthopnea, DoE.          Dillard Cannon [place], [place] 07/22/11 2324

## 2011-07-22 NOTE — ED Notes (Signed)
Dr. Antoine Poche at the bedside performing consult.

## 2011-07-22 NOTE — ED Notes (Signed)
C/o DOE and orthopnea progressively worse over past 2 weeks. Sleeping with multiple pillows at night to breathe, "wake up gasping for air." noticed swelling bilateral ankles over past days. Reports shoulder pain

## 2011-07-23 DIAGNOSIS — I2510 Atherosclerotic heart disease of native coronary artery without angina pectoris: Secondary | ICD-10-CM

## 2011-07-23 DIAGNOSIS — I059 Rheumatic mitral valve disease, unspecified: Secondary | ICD-10-CM

## 2011-07-23 LAB — CBC
HCT: 38.6 % (ref 36.0–46.0)
MCHC: 34.7 g/dL (ref 30.0–36.0)
RDW: 13.6 % (ref 11.5–15.5)
WBC: 5.2 10*3/uL (ref 4.0–10.5)

## 2011-07-23 LAB — BASIC METABOLIC PANEL
CO2: 26 mEq/L (ref 19–32)
Chloride: 106 mEq/L (ref 96–112)
Creatinine, Ser: 0.85 mg/dL (ref 0.50–1.10)
GFR calc Af Amer: 88 mL/min — ABNORMAL LOW (ref >90)
Potassium: 4.1 mEq/L (ref 3.5–5.1)
Sodium: 143 mEq/L (ref 135–145)

## 2011-07-23 LAB — CARDIAC PANEL(CRET KIN+CKTOT+MB+TROPI)
CK, MB: 1.9 ng/mL (ref 0.3–4.0)
Relative Index: 1.9 (ref 0.0–2.5)
Relative Index: INVALID (ref 0.0–2.5)
Total CK: 108 U/L (ref 7–177)
Total CK: 101 U/L (ref 7–177)
Troponin I: <0.3 ng/mL (ref <0.30)
Troponin I: <0.3 ng/mL (ref <0.30)

## 2011-07-23 LAB — HEMOGLOBIN A1C
Hgb A1c MFr Bld: 6.2 % — ABNORMAL HIGH (ref <5.7)
Mean Plasma Glucose: 131 mg/dL — ABNORMAL HIGH (ref <117)

## 2011-07-23 LAB — TSH: TSH: 3.731 u[IU]/mL (ref 0.350–4.500)

## 2011-07-23 LAB — GLUCOSE, CAPILLARY: Glucose-Capillary: 137 mg/dL — ABNORMAL HIGH (ref 70–99)

## 2011-07-23 LAB — PRO B NATRIURETIC PEPTIDE: Pro B Natriuretic peptide (BNP): 487.4 pg/mL — ABNORMAL HIGH (ref 0–125)

## 2011-07-23 MED ORDER — NITROGLYCERIN 0.4 MG SL SUBL: 0.4000 mg | SUBLINGUAL_TABLET | SUBLINGUAL | Status: DC | PRN

## 2011-07-23 MED ORDER — ASPIRIN 81 MG PO TABS: 81.0000 mg | ORAL_TABLET | Freq: Every day | ORAL | Status: AC

## 2011-07-23 NOTE — Progress Notes (Signed)
Patient ID: Bonnie Ryan, female   DOB: Aug 05, 1956, 55 y.o.   MRN: [id number] Subjective:  Dyspnea, chest pain and peripheral edema all improved. Objective:  Vital Signs in the last 24 hours: Temp:  [97 F (36.1 C)-98.5 F (36.9 C)] 98 F (36.7 C) (11/30 0427) Pulse Rate:  [64-77] 64  (11/30 0427) Resp:  [16-20] 20  (11/30 0427) BP: (119-180)/(73-99) 119/75 mmHg (11/30 0427) SpO2:  [95 %-98 %] 95 % (11/30 0427) Weight:  [80.241 kg (176 lb 14.4 oz)-81.647 kg (180 lb)] 179 lb 4.8 oz (81.33 kg) (11/30 0700)  Intake/Output from previous day: 11/29 0701 - 11/30 0700 In: -  Out: 150 [Urine:150] Intake/Output from this shift:    Physical Exam: Well appearing NAD HEENT: Unremarkable Neck:  No JVD, no thyromegally Lymphatics:  No adenopathy Back:  No CVA tenderness Lungs:  Clear with no wheezes. HEART:  Regular rate rhythm, no murmurs, no rubs, no clicks Abd:  Flat, positive bowel sounds, no organomegally, no rebound, no guarding Ext:  2 plus pulses, no edema, no cyanosis, no clubbing Skin:  No rashes no nodules Neuro:  CN II through XII intact, motor grossly intact  Lab Results:  Basename 07/23/11 0026 07/22/11 2159 07/22/11 1917  WBC 5.2 -- 5.8  HGB 13.4 15.0 --  PLT 134* -- 138*    Basename 07/23/11 0620 07/22/11 2159  NA 143 145  K 4.1 3.6  CL 106 111  CO2 26 --  GLUCOSE 147* 104*  BUN 13 14  CREATININE 0.85 0.80    Basename 07/23/11 0900 07/23/11 0021  TROPONINI <0.30 <0.30   Hepatic Function Panel No results found for this basename: PROT,ALBUMIN,AST,ALT,ALKPHOS,BILITOT,BILIDIR,IBILI in the last 72 hours No results found for this basename: CHOL in the last 72 hours No results found for this basename: PROTIME in the last 72 hours    Cardiac Studies: Tele - NSR Assessment/Plan:  1. Chest pain - she has ruled out for MI. Will plan outpatient stress test unless her echo demonstrates segmental wall motion abnormalities. 2. Dyspnea - likely multi-factorial  but BNP was elevated. Will plan to discharge on a diuretic if her echo is ok. Note she has been drinking more water.  3. Peripheral edema - this has resolved. I have asked her to reduce her sodium intake. Additional evaluation would depend on echo results.  LOS: 1 day    Lewayne Bunting 07/23/2011, 10:58 AM

## 2011-07-23 NOTE — Discharge Summary (Signed)
Patient ID: Bonnie Ryan,  MRN: [id number], DOB/AGE: 08/28/1955 55 y.o.  Admit date: 07/22/2011 Discharge date: 07/23/2011  Primary Care Provider: Dr. Paulino Rily Primary Cardiologist: Dr. Valera Castle  Discharge Diagnoses Active Problems:  CAD, NATIVE VESSEL  MITRAL VALVE PROLAPSE  CHEST PAIN, PRECORDIAL  Edema   Allergies Allergies  Allergen Reactions  . Penicillins Other (See Comments)    Convulsions     Procedures  2D Echo 07/23/2011 Study Conclusions  - Left ventricle: The cavity size was normal. Wall thickness was normal. The estimated ejection fraction was 60%. Wall motion was normal; there were no regional wall motion abnormalities. - Mitral valve: There is moderately severe bileaflet mitral prolapse. There is mild/moderate MR.  History of Present Illness  55 y/o female w/ h/o nonobstructive CAD, mitral valve prolapse, and moderate mitral regurgitation who was in her usoh until approx 10 days prior to admission when she began to experience DOE, mild chest tightness, and mild lower extremity edema.  She was seen by her primary care MD on day of admission, and subsequently sent to the ED for evaluation.  In the ED, her CE were negative and EKG was non-acute.  She was admitted for further evaluation.  Hospital Course   Pt r/o for MI.  She was gently diuresed with resolution of edema and dyspnea.  An echocardiogram was performed this afternoon revealing Normal LV function with moderately severe bileaflet mitral prolapse and mild/moderate mitral regurgitation.  Overall this was not felt to be significantly different from prior echo and patient is felt to be ready for discharge.  We will arrange for her to undergo an outpatient exercise myoview.  Discharge Vitals:  Blood pressure 119/75, pulse 64, temperature 98 F (36.7 C), temperature source Oral, resp. rate 20, height 5\' 7"  (1.702 m), weight 81.33 kg (179 lb 4.8 oz), SpO2 95.00%.   Labs: CBC:  Basename 07/23/11  0026 07/22/11 2159 07/22/11 1917  WBC 5.2 -- 5.8  NEUTROABS -- -- 2.7  HGB 13.4 15.0 --  HCT 38.6 44.0 --  MCV 97.2 -- 97.5  PLT 134* -- 138*   Basic Metabolic Panel:  Basename 07/23/11 0620 07/22/11 2159 07/22/11 1917  NA 143 145 --  K 4.1 3.6 --  CL 106 111 --  CO2 26 -- --  GLUCOSE 147* 104* --  BUN 13 14 --  CREATININE 0.85 0.80 --  CALCIUM 9.0 -- --  MG -- -- 2.2  PHOS -- -- --   Cardiac Enzymes:  Basename 07/23/11 1544 07/23/11 0900 07/23/11 0021  CKTOTAL 101 93 108  CKMB 1.9 2.0 2.0  CKMBINDEX -- -- --  TROPONINI <0.30 <0.30 <0.30   BNP:  Basename 07/23/11 0021 07/22/11 1917  POCBNP 487.4* 579.0*  Hemoglobin A1C:  Basename 07/23/11 0026  HGBA1C 6.2*  Thyroid Function Tests:  Basename 07/23/11 0026  TSH 3.731  T4TOTAL --  T3FREE --  THYROIDAB --    Disposition:  Follow-up Information    Follow up with Valera Castle, MD. (Call for Appt.  We will arrange an outpatient stress test.)    Contact information:   [address] [place] [address] [phone]          Discharge Medications: Current Discharge Medication List    START taking these medications   Details  aspirin 81 MG tablet Take 1 tablet (81 mg total) by mouth daily.    nitroGLYCERIN (NITROSTAT) 0.4 MG SL tablet Place 1 tablet (0.4 mg total) under  the tongue every 5 (five) minutes as needed for chest pain. Qty: 25 tablet, Refills: 3      CONTINUE these medications which have NOT CHANGED   Details  ezetimibe-simvastatin (VYTORIN) 10-40 MG per tablet Take 1 tablet by mouth at bedtime.      hydrochlorothiazide (HYDRODIURIL) 25 MG tablet Take 25 mg by mouth daily.      metoprolol (TOPROL-XL) 50 MG 24 hr tablet Take 50 mg by mouth daily.          Outstanding Labs/Studies  Outpatient Myoview pending  Duration of Discharge Encounter:   Greater than 30 minutes including physician time.  Signed, Nicolasa Ducking NP 07/23/2011,  5:16 PM

## 2011-07-23 NOTE — Progress Notes (Signed)
  Echocardiogram 2D Echocardiogram has been performed.  Bonnie Ryan 07/23/2011, 4:34 PM

## 2011-07-24 NOTE — ED Provider Notes (Signed)
Evaluation and management procedures were performed by the mid-level provider (PA/NP/CNM) under my supervision/collaboration. I was present and available during the ED course. Jane Birkel Y.   Gavin Pound. Oletta Lamas, MD 07/24/11 [phone]

## 2011-07-26 ENCOUNTER — Other Ambulatory Visit (HOSPITAL_COMMUNITY): Payer: Self-pay | Admitting: Cardiology

## 2011-07-26 DIAGNOSIS — R079 Chest pain, unspecified: Secondary | ICD-10-CM

## 2011-07-27 ENCOUNTER — Telehealth: Payer: Self-pay | Admitting: Cardiology

## 2011-07-27 NOTE — Telephone Encounter (Signed)
New Msg: Pt calling wanting letter written stating that pt is not suitable to return to work, is under Dr. Vern Claude care and is having testing done. Please return pt call to discuss further.

## 2011-07-28 ENCOUNTER — Ambulatory Visit (HOSPITAL_COMMUNITY): Payer: Managed Care, Other (non HMO) | Attending: Cardiology | Admitting: Radiology

## 2011-07-28 VITALS — Ht 67.0 in | Wt 174.0 lb

## 2011-07-28 DIAGNOSIS — R002 Palpitations: Secondary | ICD-10-CM | POA: Insufficient documentation

## 2011-07-28 DIAGNOSIS — R5383 Other fatigue: Secondary | ICD-10-CM | POA: Insufficient documentation

## 2011-07-28 DIAGNOSIS — I10 Essential (primary) hypertension: Secondary | ICD-10-CM | POA: Insufficient documentation

## 2011-07-28 DIAGNOSIS — E119 Type 2 diabetes mellitus without complications: Secondary | ICD-10-CM | POA: Insufficient documentation

## 2011-07-28 DIAGNOSIS — E785 Hyperlipidemia, unspecified: Secondary | ICD-10-CM | POA: Insufficient documentation

## 2011-07-28 DIAGNOSIS — F17200 Nicotine dependence, unspecified, uncomplicated: Secondary | ICD-10-CM | POA: Insufficient documentation

## 2011-07-28 DIAGNOSIS — R079 Chest pain, unspecified: Secondary | ICD-10-CM

## 2011-07-28 DIAGNOSIS — R0602 Shortness of breath: Secondary | ICD-10-CM | POA: Insufficient documentation

## 2011-07-28 DIAGNOSIS — R9431 Abnormal electrocardiogram [ECG] [EKG]: Secondary | ICD-10-CM | POA: Insufficient documentation

## 2011-07-28 DIAGNOSIS — R5381 Other malaise: Secondary | ICD-10-CM | POA: Insufficient documentation

## 2011-07-28 DIAGNOSIS — R110 Nausea: Secondary | ICD-10-CM | POA: Insufficient documentation

## 2011-07-28 DIAGNOSIS — R000 Tachycardia, unspecified: Secondary | ICD-10-CM | POA: Insufficient documentation

## 2011-07-28 DIAGNOSIS — I2510 Atherosclerotic heart disease of native coronary artery without angina pectoris: Secondary | ICD-10-CM

## 2011-07-28 DIAGNOSIS — R61 Generalized hyperhidrosis: Secondary | ICD-10-CM | POA: Insufficient documentation

## 2011-07-28 DIAGNOSIS — I059 Rheumatic mitral valve disease, unspecified: Secondary | ICD-10-CM

## 2011-07-28 MED ORDER — TECHNETIUM TC 99M TETROFOSMIN IV KIT
11.0000 | PACK | Freq: Once | INTRAVENOUS | Status: AC | PRN
  Administered 2011-07-28: 11 via INTRAVENOUS

## 2011-07-28 MED ORDER — TECHNETIUM TC 99M TETROFOSMIN IV KIT
33.0000 | PACK | Freq: Once | INTRAVENOUS | Status: AC | PRN
  Administered 2011-07-28: 33 via INTRAVENOUS

## 2011-07-28 NOTE — Progress Notes (Signed)
[facility] SITE 3 NUCLEAR MED [address] [place] [address] [phone]  Cardiology Nuclear Med Study  Bonnie Ryan is a 55 y.o. female [id number] October 22, 1955   Nuclear Med Background Indication for Stress Test:  Evaluation for Ischemia and Abnormal EKG; 07/22/11 ED with CP, MI R/O History:  '94 OZH:[id number]; '00 Stress Echo:Normal; '08 Cath:N/O CAD, EF=65%; 07/23/11 Echo:EF=60%, severe MVP Cardiac Risk Factors: Hypertension, Lipids, NIDDM and Smoker  Symptoms:  Chest Pressure with Exertion (last episode of chest discomfort was 2-days ago), Diaphoresis, DOE/SOB, Fatigue, Nausea, Palpitations and Rapid HR    Nuclear Pre-Procedure Caffeine/Decaff Intake:  None NPO After: 8:00am yesterday  Lungs:  Clear. IV 0.9% NS with Angio Cath:  22g  IV Site: L Antecubital  IV Started by:  Bonnita Levan, RN  Chest Size (in):  38 Cup Size: D  Height: 5\' 7"  (1.702 m)  Weight:  174 lb (78.926 kg)  BMI:  Body mass index is 27.25 kg/(m^2). Tech Comments:  Held Toprol x 24 hours    Nuclear Med Study 1 or 2 day study: 1 day  Stress Test Type:  Stress  Reading MD: Kristeen Miss, MD  Order Authorizing Provider:  Valera Castle, MD  Resting Radionuclide: Technetium 75m Tetrofosmin  Resting Radionuclide Dose: 10.4 mCi   Stress Radionuclide:  Technetium 2m Tetrofosmin  Stress Radionuclide Dose: 33.0 mCi           Stress Protocol Rest HR: 68 Stress HR: 146  Rest BP: Sitting:130/82  Standing:120/83 Stress BP: 211/89  Exercise Time (min): 5:15 METS: 7.0   Predicted Max HR: 165 bpm % Max HR: 88.48 bpm Rate Pressure Product: 46962   Dose of Adenosine (mg):  n/a Dose of Lexiscan: n/a mg  Dose of Atropine (mg): n/a Dose of Dobutamine: n/a mcg/kg/min (at max HR)  Stress Test Technologist: Smiley Houseman, CMA-N  Nuclear Technologist:  Domenic Polite, CNMT     Rest Procedure:  Myocardial perfusion imaging was performed at rest 45 minutes following the intravenous administration  of Technetium 49m Tetrofosmin.  Rest ECG: T-wave inversion with occasional PVC's.  Stress Procedure:  The patient exercised for 5:16 on the treadmill utilizing the Bruce protocol.  The patient stopped due to fatigue and denied any chest pain.  There were no diagnostic ST-T wave changes.  There were frequent PVC's with occasional couplets and frequent PAC's.  She had a hypertensive response to exercise, 211/89, at peak.  Technetium 45m Tetrofosmin was injected at peak exercise and myocardial perfusion imaging was performed after a brief delay.  Stress ECG: No significant change from baseline ECG  QPS Raw Data Images:  Normal; no motion artifact; normal heart/lung ratio. Stress Images:  Normal homogeneous uptake in all areas of the myocardium. Rest Images:  Normal homogeneous uptake in all areas of the myocardium. Subtraction (SDS):  No evidence of ischemia. Transient Ischemic Dilatation (Normal <1.22):  0.90 Lung/Heart Ratio (Normal <0.45):  0.32  Quantitative Gated Spect Images QGS EDV:  93 ml QGS ESV:  23 ml QGS cine images:  NL LV Function; NL Wall Motion QGS EF: 75%  Impression Exercise Capacity:  Fair exercise capacity. BP Response:  Normal blood pressure response. Clinical Symptoms:  No chest pain. ECG Impression:  No significant ST segment change suggestive of ischemia. Comparison with Prior Nuclear Study: No significant change from previous study  Overall Impression:  Normal stress nuclear study.  There is no ischemia.  Normal LV function with an EF of 75%.   Vesta Mixer,  Montez Hageman., MD, [facility] 07/28/2011, 6:12 PM

## 2011-07-29 ENCOUNTER — Encounter: Payer: Self-pay | Admitting: *Deleted

## 2011-07-29 NOTE — Telephone Encounter (Signed)
Spoke with pt and aware NM study was normal. No requests at this time for out of work letter Mylo Red RN

## 2011-07-30 ENCOUNTER — Encounter: Payer: Self-pay | Admitting: Physician Assistant

## 2011-07-30 ENCOUNTER — Encounter: Payer: Self-pay | Admitting: *Deleted

## 2011-07-30 ENCOUNTER — Ambulatory Visit (INDEPENDENT_AMBULATORY_CARE_PROVIDER_SITE_OTHER): Payer: Managed Care, Other (non HMO) | Admitting: Physician Assistant

## 2011-07-30 DIAGNOSIS — I10 Essential (primary) hypertension: Secondary | ICD-10-CM | POA: Insufficient documentation

## 2011-07-30 DIAGNOSIS — R079 Chest pain, unspecified: Secondary | ICD-10-CM

## 2011-07-30 DIAGNOSIS — R0602 Shortness of breath: Secondary | ICD-10-CM

## 2011-07-30 DIAGNOSIS — I059 Rheumatic mitral valve disease, unspecified: Secondary | ICD-10-CM

## 2011-07-30 DIAGNOSIS — R072 Precordial pain: Secondary | ICD-10-CM

## 2011-07-30 DIAGNOSIS — I2510 Atherosclerotic heart disease of native coronary artery without angina pectoris: Secondary | ICD-10-CM

## 2011-07-30 DIAGNOSIS — E785 Hyperlipidemia, unspecified: Secondary | ICD-10-CM | POA: Insufficient documentation

## 2011-07-30 LAB — CBC WITH DIFFERENTIAL/PLATELET
Basophils Absolute: 0 10*3/uL (ref 0.0–0.1)
HCT: 46.2 % — ABNORMAL HIGH (ref 36.0–46.0)
Hemoglobin: 15.9 g/dL — ABNORMAL HIGH (ref 12.0–15.0)
Lymphs Abs: 2.4 10*3/uL (ref 0.7–4.0)
MCV: 102.4 fl — ABNORMAL HIGH (ref 78.0–100.0)
Monocytes Absolute: 0.5 10*3/uL (ref 0.1–1.0)
Monocytes Relative: 7.7 % (ref 3.0–12.0)
Neutro Abs: 2.9 10*3/uL (ref 1.4–7.7)
Platelets: 168 10*3/uL (ref 150.0–400.0)
RDW: 14 % (ref 11.5–14.6)

## 2011-07-30 LAB — BASIC METABOLIC PANEL
BUN: 18 mg/dL (ref 6–23)
CO2: 32 mEq/L (ref 19–32)
Calcium: 9.6 mg/dL (ref 8.4–10.5)
Chloride: 98 mEq/L (ref 96–112)
Creatinine, Ser: 0.8 mg/dL (ref 0.4–1.2)
Glucose, Bld: 103 mg/dL — ABNORMAL HIGH (ref 70–99)

## 2011-07-30 LAB — PROTIME-INR: INR: 1 ratio (ref 0.8–1.0)

## 2011-07-30 LAB — BRAIN NATRIURETIC PEPTIDE: Pro B Natriuretic peptide (BNP): 29 pg/mL (ref 0.0–100.0)

## 2011-07-30 NOTE — Assessment & Plan Note (Signed)
Proceed with cath to assess for progression given her symptoms.

## 2011-07-30 NOTE — Assessment & Plan Note (Signed)
Continue Vytorin.

## 2011-07-30 NOTE — Assessment & Plan Note (Signed)
Proceed with R and L heart cath as noted.

## 2011-07-30 NOTE — Assessment & Plan Note (Signed)
Consider TEE depending on results of cath.  This will be per Dr. Valera Castle, her primary cardiologist.

## 2011-07-30 NOTE — Patient Instructions (Addendum)
Your physician has requested that you have a RIGHT AND LEFT HEART cardiac catheterization WITH COOPER 08/06/11 8:30 AM. Cardiac catheterization is used to diagnose and/or treat various heart conditions. Doctors may recommend this procedure for a number of different reasons. The most common reason is to evaluate chest pain. Chest pain can be a symptom of coronary artery disease (CAD), and cardiac catheterization can show whether plaque is narrowing or blocking your heart's arteries. This procedure is also used to evaluate the valves, as well as measure the blood flow and oxygen levels in different parts of your heart. For further information please visit [email or web address] Please follow instruction sheet, as given.  Your physician recommends that you return for lab work in: TODAY PRE CATH LABS BMET, CBC W/DIFF, PT/INR, BNP   Your physician recommends that you schedule a follow-up appointment in: 2 WEEKS WITH DR. Daleen Squibb

## 2011-07-30 NOTE — Assessment & Plan Note (Signed)
Borderline elevated.  Will see if she needs adjustment in diuretics first.

## 2011-07-30 NOTE — Assessment & Plan Note (Signed)
Her symptoms are somewhat concerning.  They have progressed over the last several weeks.  She presented to the hospital with an elevated BNP.  CXR was negative for edema.  Her Echo demonstrates moderately severe MVP with mild to mod MR.  ? If her MVP and MR is worse than what is seen on TTE.  She has significant cardiac risk factors: DM2, HTN, HLP, smoking.  A recent neg myoview is reassuring.  However, ischemia is not entirely excluded.  She has inf-lat TW inversions which were not present on her ECG in 10/2009.  I have recommended proceeding with R&L Heart Cath.  I discussed this with Dr. Olga Millers and he agreed.  Risks and benefits of cardiac catheterization have been discussed with the patient.  These include bleeding, infection, kidney damage, stroke, heart attack, death.  The patient understands these risks and is willing to proceed.  I will also check a bmet and bnp.  If BNP is elevated, her diuretics will be adjusted.  Depending on the results of her heart cath, she may need consideration for a TEE to further assess her mitral valve.  I have discussed all of this with Bonnie Ryan and her family.

## 2011-07-30 NOTE — Progress Notes (Signed)
[address] [place], [place]  [address] Phone: [phone] Fax:  [phone]  Date:  07/30/2011   Name:  Bonnie Ryan       DOB:  08/16/1956 MRN:  [id number]  PCP:  Dr. Wolters Primary Cardiologist:  Dr. Thomas Wall    History of Present Illness: Bonnie Ryan is a 55 y.o. female who presents for post hospital follow up.  She has a history of mitral valve prolapse and nonobstructive coronary artery disease.  LHC 2/08: pLAD 30%, mRCA 25%, dRCA 25%, EF 60-65%.  Last echo 3/11 demonstrated moderate MVP with mild MR.  Other history includes hypertension, diabetes and hyperlipidemia.  She presented with lower extremity edema and worsening shortness of breath.  She was admitted to [facility] 11/29-11/30.  Her blood pressure was elevated but she had not been taking her medications.  She also noted chest discomfort.  She ruled out for myocardial infarction.  She was apparently gently diuresed for her edema and dyspnea (although the patient states she never received Lasix).  Echocardiogram 07/23/11: Normal LV wall thickness, EF 60%, normal wall motion, moderately severe bileaflet mitral valve prolapse with mild-moderate MR.  This was not felt to be significantly different from prior echo and she was discharged home.  She was set up for an outpatient ETT-Myoview 07/28/11: Normal without ischemia, EF 75%.  Labs: Hemoglobin 13.4, potassium 4.1, creatinine 0.85, BNP 487, hemoglobin A1c 6.2, TSH 3.731.  She notes progressively worsening dyspnea with exertion, orthopnea and PND over the last several weeks.  Her edema is better since d/c from the hospital.  She now sleeps on 4-5 pillows.  She notes class 3 DOE, which is new over the last 2 weeks.  She notes occasional left chest tightness.  She notes radiation to her shoulders and jaw.  It may last several minutes and will occur at rest.  She has assoc nausea and diaphoresis.  She has felt near syncopal with her dyspnea at times.  No  syncope.    Past Medical History  Diagnosis Date  . Mitral valve prolapse     moderate by echo 10/2009 with mild MR;  Echocardiogram 07/23/11: Normal LV wall thickness, EF 60%, normal wall motion, moderately severe bileaflet mitral valve prolapse with mild-moderate MR.  This was not felt to be significantly different from prior echo  . Hypertension   . Diabetes mellitus     Previously diet-controlled  . Hypercholesteremia   . CAD (coronary artery disease)     Nonbstructive CAD by cath 09/2006 30% LAD, 25% RCA. Normal EF of 55-60% by echo 10/2009 with normal wall thickness.; ETT-Myoview 07/28/11: Normal without ischemia, EF 75%.     Current Outpatient Prescriptions  Medication Sig Dispense Refill  . aspirin 81 MG tablet Take 1 tablet (81 mg total) by mouth daily.      . ezetimibe-simvastatin (VYTORIN) 10-40 MG per tablet Take 1 tablet by mouth at bedtime.        . hydrochlorothiazide (HYDRODIURIL) 25 MG tablet Take 25 mg by mouth daily.        . metoprolol (TOPROL-XL) 50 MG 24 hr tablet Take 50 mg by mouth daily.        . nitroGLYCERIN (NITROSTAT) 0.4 MG SL tablet Place 1 tablet (0.4 mg total) under the tongue every 5 (five) minutes as needed for chest pain.  25 tablet  3    Allergies: Allergies  Allergen Reactions  . Penicillins Other (See Comments)    Convulsions       History  Substance Use Topics  . Smoking status: Current Everyday Smoker -- 0.5 packs/day for 20 years    Types: Cigarettes  . Smokeless tobacco: Not on file  . Alcohol Use: Yes     4     ROS:  Please see the history of present illness.   General ROS: positive for  - fatigue.  Respiratory ROS: positive for - cough (non productive).  Gastrointestinal ROS: positive for - diarrhea.  All other systems reviewed and negative.   PHYSICAL EXAM: VS:  BP 143/90  Pulse 66  Resp 16  Ht 5' 7" (1.702 m)  Wt 173 lb (78.472 kg)  BMI 27.10 kg/m2 Well nourished, well developed, in no acute distress HEENT: normal Neck: no  JVD Vascular: no carotid bruits Cardiac:  normal S1, S2; RRR; 2/6 mis systolic murmur along LLSB Lungs:  clear to auscultation bilaterally, no wheezing, rhonchi or rales Abd: soft, nontender, no hepatomegaly Ext: no edema Skin: warm and dry Neuro:  CNs 2-12 intact, no focal abnormalities noted Psych: normal affect  EKG:   Sinus rhythm, heart rate 66, normal axis, T wave inversions in 2, 3, aVF, V3-V6, no change from prior tracing dated 07/23/11  ASSESSMENT AND PLAN:  

## 2011-08-02 ENCOUNTER — Telehealth: Payer: Self-pay | Admitting: *Deleted

## 2011-08-02 ENCOUNTER — Telehealth: Payer: Self-pay | Admitting: Cardiology

## 2011-08-02 MED ORDER — POTASSIUM CHLORIDE CRYS ER 20 MEQ PO TBCR: 20.0000 meq | EXTENDED_RELEASE_TABLET | Freq: Every day | ORAL | Status: DC

## 2011-08-02 NOTE — Telephone Encounter (Signed)
Call was to confirm pt having cath on 08/06/11 and post hosp visit 08/11/11 Mylo Red RN

## 2011-08-02 NOTE — Telephone Encounter (Signed)
pt notified about lab results and will have repeat labs 08/11/11 @ [facility]. Danielle Rankin

## 2011-08-02 NOTE — Telephone Encounter (Signed)
New msg [employer] wanted info about heart cath that is scheduled

## 2011-08-06 ENCOUNTER — Inpatient Hospital Stay (HOSPITAL_BASED_OUTPATIENT_CLINIC_OR_DEPARTMENT_OTHER)
Admission: RE | Admit: 2011-08-06 | Discharge: 2011-08-06 | Disposition: A | Discharge status: Home / Self Care | Payer: Managed Care, Other (non HMO) | Source: Ambulatory Visit | Attending: Cardiovascular Disease | Admitting: Cardiovascular Disease

## 2011-08-06 ENCOUNTER — Encounter (HOSPITAL_BASED_OUTPATIENT_CLINIC_OR_DEPARTMENT_OTHER)
Admission: RE | Disposition: A | Discharge status: Home / Self Care | Payer: Self-pay | Source: Ambulatory Visit | Attending: Cardiovascular Disease

## 2011-08-06 DIAGNOSIS — I059 Rheumatic mitral valve disease, unspecified: Secondary | ICD-10-CM | POA: Insufficient documentation

## 2011-08-06 DIAGNOSIS — R0602 Shortness of breath: Secondary | ICD-10-CM

## 2011-08-06 DIAGNOSIS — E119 Type 2 diabetes mellitus without complications: Secondary | ICD-10-CM | POA: Insufficient documentation

## 2011-08-06 DIAGNOSIS — R079 Chest pain, unspecified: Secondary | ICD-10-CM

## 2011-08-06 DIAGNOSIS — I2510 Atherosclerotic heart disease of native coronary artery without angina pectoris: Secondary | ICD-10-CM | POA: Insufficient documentation

## 2011-08-06 DIAGNOSIS — I10 Essential (primary) hypertension: Secondary | ICD-10-CM | POA: Insufficient documentation

## 2011-08-06 DIAGNOSIS — R0989 Other specified symptoms and signs involving the circulatory and respiratory systems: Secondary | ICD-10-CM | POA: Insufficient documentation

## 2011-08-06 DIAGNOSIS — E785 Hyperlipidemia, unspecified: Secondary | ICD-10-CM | POA: Insufficient documentation

## 2011-08-06 DIAGNOSIS — R0609 Other forms of dyspnea: Secondary | ICD-10-CM | POA: Insufficient documentation

## 2011-08-06 DIAGNOSIS — I509 Heart failure, unspecified: Secondary | ICD-10-CM | POA: Insufficient documentation

## 2011-08-06 LAB — POCT I-STAT 3, VENOUS BLOOD GAS (G3P V)
Acid-Base Excess: 7 mmol/L — ABNORMAL HIGH (ref 0.0–2.0)
TCO2: 33 mmol/L (ref 0–100)
pH, Ven: 7.462 — ABNORMAL HIGH (ref 7.250–7.300)

## 2011-08-06 LAB — POCT I-STAT 3, ART BLOOD GAS (G3+)
Bicarbonate: 30 mEq/L — ABNORMAL HIGH (ref 20.0–24.0)
O2 Saturation: 97 %
TCO2: 31 mmol/L (ref 0–100)
pCO2 arterial: 35.5 mmHg (ref 35.0–45.0)

## 2011-08-06 SURGERY — JV LEFT HEART CATHETERIZATION WITH CORONARY ANGIOGRAM
Anesthesia: Moderate Sedation

## 2011-08-06 MED ORDER — SODIUM CHLORIDE 0.9 % IV SOLN: INTRAVENOUS | Status: DC

## 2011-08-06 MED ORDER — ACETAMINOPHEN 325 MG PO TABS: 650.0000 mg | ORAL_TABLET | ORAL | Status: DC | PRN

## 2011-08-06 MED ORDER — ONDANSETRON HCL 4 MG/2ML IJ SOLN: 4.0000 mg | Freq: Four times a day (QID) | INTRAMUSCULAR | Status: DC | PRN

## 2011-08-06 MED ORDER — SODIUM CHLORIDE 0.9 % IV SOLN
INTRAVENOUS | Status: DC
  Administered 2011-08-06: 08:00:00 via INTRAVENOUS

## 2011-08-06 MED ORDER — ASPIRIN 81 MG PO CHEW: 324.0000 mg | CHEWABLE_TABLET | ORAL | Status: DC

## 2011-08-06 MED ORDER — ASPIRIN 81 MG PO CHEW
324.0000 mg | CHEWABLE_TABLET | Freq: Once | ORAL | Status: AC
  Administered 2011-08-06: 324 mg via ORAL

## 2011-08-06 NOTE — OR Nursing (Signed)
Dr Cooper at bedside to discuss results and treatment plan with pt and family 

## 2011-08-06 NOTE — Op Note (Signed)
Cardiac Catheterization Procedure Note  Name: Bonnie Ryan MRN: [id number] DOB: 1955/11/08  Procedure: Right Heart Cath, Left Heart Cath, Selective Coronary Angiography, LV angiography  Indication: This is a 55 year old woman with mitral valve prolapse and mitral regurgitation. Her mitral regurgitation has been graded as mild to moderate. She has presented with congestive heart failure and has chronic orthopnea and PND. She was referred for right and left heart catheterization to assess her hemodynamics, mitral regurgitation, and coronary anatomy.   Procedural Details: The right groin was prepped, draped, and anesthetized with 1% lidocaine. Using the modified Seldinger technique a 4 French sheath was placed in the right femoral artery and a 6 French sheath was placed in the right femoral vein. A multipurpose catheter was used for the right heart catheterization. Standard protocol was followed for recording of right heart pressures and sampling of oxygen saturations. Fick cardiac output was calculated. Standard Judkins catheters were used for selective coronary angiography and left ventriculography. RAO and LAO ventriculography were performed. There were no immediate procedural complications. The patient was transferred to the post catheterization recovery area for further monitoring.  Procedural Findings: Hemodynamics RA 2 RV 19/2 PA 18/6 with a mean of 12 PCWP 4 LV 103/57 with a mean of 76 AO 103/10  Oxygen saturations: PA 67% AO 97%  Cardiac Output (Fick) 3.7 L per minute  Cardiac Index (Fick) 1.9 L per minute per meter square   Coronary angiography: Coronary dominance: right  Left mainstem: The left main stem is heavily calcified. There is no obstructive disease present. The vessel divides into the LAD and left circumflex.  Left anterior descending (LAD): The LAD has moderate calcification. The proximal vessel is patent. The first diagonal branch is small without significant  disease. The mid LAD has mild nonobstructive plaque graded at 30-40% stenosis. Beyond the second diagonal branch extending into the distal LAD there is no significant disease present.  Left circumflex (LCx): The left circumflex is a large caliber vessel. It supplies a tiny first OM branch. The main branches of the circumflex are the second and third obtuse marginals, both of which are widely patent. There is no significant stenosis throughout the left circumflex distribution.  Right coronary artery (RCA): The right coronary artery is mildly calcified. There is a long segment of mild stenosis in the proximal vessel. This is graded at 30-40% stenosis. The mid and distal vessel are free of significant disease. The vessel terminates in a PDA and a posterolateral branch, both of which are patent.  Left ventriculography: Left ventricular systolic function is normal, LVEF is estimated at 55-65%, there is 2+ mitral regurgitation Final Conclusions:   1. Calcified coronary arteries with mild nonobstructive coronary artery disease involving the proximal right coronary artery and mid LAD. 2. Normal left ventricular systolic function with normal left ventricular end-diastolic pressure 3. Moderate mitral regurgitation 4. Normal right heart hemodynamics  Recommendations:  It is difficult to explain the patient's symptoms based on her cardiac findings. She does not have significant coronary disease. Her hemodynamics are within normal limits. My angiography the degree of mitral regurgitation appears moderate.  If continued symptoms a transesophageal echocardiogram could be considered to better assess her mitral valve disease. I think smoking cessation is critical here and we discussed this at length.  The patient will followup with Dr. Daleen Squibb.   Tonny Bollman 08/06/2011, 9:21 AM

## 2011-08-06 NOTE — Interval H&P Note (Signed)
History and Physical Interval Note:  08/06/2011 8:44 AM  Bonnie Ryan  has presented today for surgery, with the diagnosis of chest pain  The various methods of treatment have been discussed with the patient and family. After consideration of risks, benefits and other options for treatment, the patient has consented to  Procedure(s): JV LEFT HEART CATHETERIZATION WITH CORONARY ANGIOGRAM as a surgical intervention .  The patients' history has been reviewed, patient examined, no change in status, stable for surgery.  I have reviewed the patients' chart and labs.  Questions were answered to the patient's satisfaction.     Tonny Bollman

## 2011-08-06 NOTE — Progress Notes (Signed)
Discharge instructions completed.  Ambulated to bathroom without bleeding from right groin site.  IV discontinued.  Discharged to home via wheelchair with son.

## 2011-08-06 NOTE — H&P (View-Only) (Signed)
[address] [place], [place]  [address] Phone: [phone] Fax:  [phone]  Date:  07/30/2011   Name:  Bonnie Ryan       DOB:  09/13/1955 MRN:  [id number]  PCP:  Dr. Paulino Rily Primary Cardiologist:  Dr. Valera Castle    History of Present Illness: Bonnie Ryan is a 55 y.o. female who presents for post hospital follow up.  She has a history of mitral valve prolapse and nonobstructive coronary artery disease.  LHC 2/08: pLAD 30%, mRCA 25%, dRCA 25%, EF 60-65%.  Last echo 3/11 demonstrated moderate MVP with mild MR.  Other history includes hypertension, diabetes and hyperlipidemia.  She presented with lower extremity edema and worsening shortness of breath.  She was admitted to [facility] 11/29-11/30.  Her blood pressure was elevated but she had not been taking her medications.  She also noted chest discomfort.  She ruled out for myocardial infarction.  She was apparently gently diuresed for her edema and dyspnea (although the patient states she never received Lasix).  Echocardiogram 07/23/11: Normal LV wall thickness, EF 60%, normal wall motion, moderately severe bileaflet mitral valve prolapse with mild-moderate MR.  This was not felt to be significantly different from prior echo and she was discharged home.  She was set up for an outpatient ETT-Myoview 07/28/11: Normal without ischemia, EF 75%.  Labs: Hemoglobin 13.4, potassium 4.1, creatinine 0.85, BNP 487, hemoglobin A1c 6.2, TSH 3.731.  She notes progressively worsening dyspnea with exertion, orthopnea and PND over the last several weeks.  Her edema is better since d/c from the hospital.  She now sleeps on 4-5 pillows.  She notes class 3 DOE, which is new over the last 2 weeks.  She notes occasional left chest tightness.  She notes radiation to her shoulders and jaw.  It may last several minutes and will occur at rest.  She has assoc nausea and diaphoresis.  She has felt near syncopal with her dyspnea at times.  No  syncope.    Past Medical History  Diagnosis Date  . Mitral valve prolapse     moderate by echo 10/2009 with mild MR;  Echocardiogram 07/23/11: Normal LV wall thickness, EF 60%, normal wall motion, moderately severe bileaflet mitral valve prolapse with mild-moderate MR.  This was not felt to be significantly different from prior echo  . Hypertension   . Diabetes mellitus     Previously diet-controlled  . Hypercholesteremia   . CAD (coronary artery disease)     Nonbstructive CAD by cath 09/2006 30% LAD, 25% RCA. Normal EF of 55-60% by echo 10/2009 with normal wall thickness.; ETT-Myoview 07/28/11: Normal without ischemia, EF 75%.     Current Outpatient Prescriptions  Medication Sig Dispense Refill  . aspirin 81 MG tablet Take 1 tablet (81 mg total) by mouth daily.      Marland Kitchen ezetimibe-simvastatin (VYTORIN) 10-40 MG per tablet Take 1 tablet by mouth at bedtime.        . hydrochlorothiazide (HYDRODIURIL) 25 MG tablet Take 25 mg by mouth daily.        . metoprolol (TOPROL-XL) 50 MG 24 hr tablet Take 50 mg by mouth daily.        . nitroGLYCERIN (NITROSTAT) 0.4 MG SL tablet Place 1 tablet (0.4 mg total) under the tongue every 5 (five) minutes as needed for chest pain.  25 tablet  3    Allergies: Allergies  Allergen Reactions  . Penicillins Other (See Comments)    Convulsions  History  Substance Use Topics  . Smoking status: Current Everyday Smoker -- 0.5 packs/day for 20 years    Types: Cigarettes  . Smokeless tobacco: Not on file  . Alcohol Use: Yes     4     ROS:  Please see the history of present illness.   General ROS: positive for  - fatigue.  Respiratory ROS: positive for - cough (non productive).  Gastrointestinal ROS: positive for - diarrhea.  All other systems reviewed and negative.   PHYSICAL EXAM: VS:  BP 143/90  Pulse 66  Resp 16  Ht 5\' 7"  (1.702 m)  Wt 173 lb (78.472 kg)  BMI 27.10 kg/m2 Well nourished, well developed, in no acute distress HEENT: normal Neck: no  JVD Vascular: no carotid bruits Cardiac:  normal S1, S2; RRR; 2/6 mis systolic murmur along LLSB Lungs:  clear to auscultation bilaterally, no wheezing, rhonchi or rales Abd: soft, nontender, no hepatomegaly Ext: no edema Skin: warm and dry Neuro:  CNs 2-12 intact, no focal abnormalities noted Psych: normal affect  EKG:   Sinus rhythm, heart rate 66, normal axis, T wave inversions in 2, 3, aVF, V3-V6, no change from prior tracing dated 07/23/11  ASSESSMENT AND PLAN:

## 2011-08-06 NOTE — OR Nursing (Signed)
Tegaderm dressing applied, site intact, level 0, bedrest begins at 0940

## 2011-08-11 ENCOUNTER — Ambulatory Visit (INDEPENDENT_AMBULATORY_CARE_PROVIDER_SITE_OTHER): Payer: Managed Care, Other (non HMO) | Admitting: *Deleted

## 2011-08-11 ENCOUNTER — Encounter: Payer: Self-pay | Admitting: *Deleted

## 2011-08-11 ENCOUNTER — Ambulatory Visit (INDEPENDENT_AMBULATORY_CARE_PROVIDER_SITE_OTHER): Payer: Managed Care, Other (non HMO) | Admitting: Physician Assistant

## 2011-08-11 ENCOUNTER — Encounter: Payer: Self-pay | Admitting: Physician Assistant

## 2011-08-11 VITALS — BP 123/83 | HR 70 | Ht 67.0 in | Wt 176.0 lb

## 2011-08-11 DIAGNOSIS — I2510 Atherosclerotic heart disease of native coronary artery without angina pectoris: Secondary | ICD-10-CM

## 2011-08-11 DIAGNOSIS — I10 Essential (primary) hypertension: Secondary | ICD-10-CM

## 2011-08-11 DIAGNOSIS — E785 Hyperlipidemia, unspecified: Secondary | ICD-10-CM

## 2011-08-11 DIAGNOSIS — I059 Rheumatic mitral valve disease, unspecified: Secondary | ICD-10-CM

## 2011-08-11 DIAGNOSIS — R42 Dizziness and giddiness: Secondary | ICD-10-CM | POA: Insufficient documentation

## 2011-08-11 DIAGNOSIS — R0602 Shortness of breath: Secondary | ICD-10-CM

## 2011-08-11 LAB — BASIC METABOLIC PANEL
BUN: 18 mg/dL (ref 6–23)
Calcium: 9.1 mg/dL (ref 8.4–10.5)
Creatinine, Ser: 0.9 mg/dL (ref 0.4–1.2)
GFR: 68.18 mL/min (ref >60.00)
Glucose, Bld: 155 mg/dL — ABNORMAL HIGH (ref 70–99)
Sodium: 141 mEq/L (ref 135–145)

## 2011-08-11 NOTE — Assessment & Plan Note (Addendum)
As noted, no further workup.  She can follow up with me or Dr. Valera Castle in 6 mos.

## 2011-08-11 NOTE — Assessment & Plan Note (Signed)
Managed by PCP

## 2011-08-11 NOTE — Assessment & Plan Note (Addendum)
Fair control.  D/c cigs.  Follow for now.

## 2011-08-11 NOTE — Patient Instructions (Signed)
Your physician recommends that you schedule a follow-up appointment in: 6 months with Dr Daleen Squibb or Tereso Newcomer, PA-C

## 2011-08-11 NOTE — Assessment & Plan Note (Addendum)
Orthostatics checked sitting and standing today.  There was no significant BP drop or HR increase.  ? Anxiety component.

## 2011-08-11 NOTE — Assessment & Plan Note (Signed)
Non-obstructive CAD by cath.  Continue ASA and statin.  Quit cigs.

## 2011-08-11 NOTE — Assessment & Plan Note (Addendum)
Symptoms seem to have worsened over the last 1-2 mos.  She is quite frustrated by her symptoms.  She had no significant disease on her cath to explain symptoms.  MR has not looked bad enough on echo or cath to explain symptoms.  Right heart hemodynamics are normal.  TEE is a consideration, but unlikely to be helpful with results of testing already completed.  She also has a significant smoking hx.  However, her symptoms do not sound pulmonary in origin.  She was also seen by Dr. Valera Castle today.  She apparently has had these symptoms for many years.  After further review with Dr. Daleen Squibb, it is felt that her symptoms are likely a combination of MVP (not MR), smoking hx, deconditioning and anxiety.  No further workup is planned from a cardiac perspective at this time.  She has been advised to stop smoking, increase activity and work on anxiety issues.

## 2011-08-11 NOTE — Progress Notes (Signed)
[address] [place], [place]  [address] Phone: [phone] Fax:  [phone]  Date:  08/11/2011   Name:  Bonnie Ryan       DOB:  [birth year] MRN:  [id number]  PCP:  Dr. Paulino Rily Primary Cardiologist:  Dr. Valera Castle    History of Present Illness: Bonnie Ryan is a 55 y.o. female who presents for follow up.  She has a history of mitral valve prolapse and nonobstructive coronary artery disease.  LHC 2/08: pLAD 30%, mRCA 25%, dRCA 25%, EF 60-65%.  Last echo 3/11 demonstrated moderate MVP with mild MR.  Other history includes hypertension, diabetes and hyperlipidemia.  Ad[facility]12 with edema and chest pain.  MI was ruled out.  Echo 07/23/11: Normal LV wall thickness, EF 60%, normal wall motion, moderately severe bileaflet mitral valve prolapse with mild-moderate MR.  This was not felt to be significantly different from prior echo.  Outpatient ETT-Myoview 07/28/11: Normal without ischemia, EF 75%.    I saw her in followup 07/30/11.  She noted progressively worsening dyspnea with exertion, orthopnea and PND over the previous several weeks.  She also noted occasional left chest tightness that radiated to her shoulders and jaw.  We set her up for outpatient right and left heart catheterization:  LHC 08/06/11: Hemodynamics: RA 2, RV 19/2, PA 18/6 with a mean of 12, PCWP 4, LV 103/57 with a mean of 76, AO 103/10 Oxygen saturations: PA 67%, AO 97%  Cardiac Output (Fick) 3.7 L per minute  Cardiac Index (Fick) 1.9 L per minute per meter square  Coronary angiography:  LM calcified, mod LAD calcification, mid LAD 30-40%, prox RCA 30-40%, EF 55-65%, 2+ MR  Final Conclusions:  1. Calcified coronary arteries with mild nonobstructive coronary artery disease involving the proximal right coronary artery and mid LAD.  2. Normal left ventricular systolic function with normal left ventricular end-diastolic pressure  3. Moderate mitral regurgitation  4. Normal right  heart hemodynamics   Recommendations:  It is difficult to explain the patient's symptoms based on her cardiac findings. She does not have significant coronary disease. Her hemodynamics are within normal limits. By angiography the degree of mitral regurgitation appears moderate. If continued symptoms a transesophageal echocardiogram could be considered to better assess her mitral valve disease. I think smoking cessation is critical here and we discussed this at length. The patient will followup with Dr. Daleen Squibb.  She continues to have the same symptoms.  This is quite frustrating for her.  She continues to have 4-5 pillow orthopnea, PND, class 3 DOE and left sided chest pain.  Also notes dizziness with standing at times and with bending over.    Past Medical History  Diagnosis Date  . Mitral valve prolapse     moderate by echo 10/2009 with mild MR;  Echocardiogram 07/23/11: Normal LV wall thickness, EF 60%, normal wall motion, moderately severe bileaflet mitral valve prolapse with mild-moderate MR.  This was not felt to be significantly different from prior echo;  RHC 12/12: RA 2, RV 19/2, PA 18/6 with a mean of 12, PCWP 4, LV 103/57 with a mean of 76, AO 103/10;  CO 3.7, CI 1.9, O2 - PA 67%, Ao 97%  . Hypertension   . Diabetes mellitus     Previously diet-controlled  . Hypercholesteremia   . CAD (coronary artery disease)     Nonbstructive CAD by cath 09/2006 30% LAD, 25% RCA. Normal EF of 55-60% by echo 10/2009 with normal wall thickness.;  ETT-Myoview 07/28/11: Normal without ischemia, EF 75%. ; LHC 12/12:   LM calcified, mod LAD calcification, mid LAD 30-40%, prox RCA 30-40%, EF 55-65%, 2+ MR    Current Outpatient Prescriptions  Medication Sig Dispense Refill  . aspirin 81 MG tablet Take 1 tablet (81 mg total) by mouth daily.      Marland Kitchen ezetimibe-simvastatin (VYTORIN) 10-40 MG per tablet Take 1 tablet by mouth at bedtime.        . hydrochlorothiazide (HYDRODIURIL) 25 MG tablet Take 25 mg by mouth daily.         . metoprolol (TOPROL-XL) 50 MG 24 hr tablet Take 50 mg by mouth daily.        . nitroGLYCERIN (NITROSTAT) 0.4 MG SL tablet Place 1 tablet (0.4 mg total) under the tongue every 5 (five) minutes as needed for chest pain.  25 tablet  3  . potassium chloride SA (KLOR-CON M20) 20 MEQ tablet Take 1 tablet (20 mEq total) by mouth daily.  30 tablet  11    Allergies: Allergies  Allergen Reactions  . Penicillins Other (See Comments)    Convulsions     History  Substance Use Topics  . Smoking status: Current Everyday Smoker -- 0.5 packs/day for 20 years    Types: Cigarettes  . Smokeless tobacco: Not on file  . Alcohol Use: Yes     4     ROS:  Please see the history of present illness.    All other systems reviewed and negative.   PHYSICAL EXAM: VS:  BP 130/86  Pulse 66  Ht 5\' 7"  (1.702 m)  Wt 176 lb (79.833 kg)  BMI 27.57 kg/m2 Well nourished, well developed, in no acute distress HEENT: normal Neck: no JVD Cardiac:  normal S1, S2; RRR; 2/6 mid systolic murmur along LLSB Lungs:  clear to auscultation bilaterally, no wheezing, rhonchi or rales Abd: soft, nontender, no hepatomegaly Ext: no edema; RFA site without hematoma or bruit Skin: warm and dry Neuro:  CNs 2-12 intact, no focal abnormalities noted Psych: normal affect  EKG:   Sinus rhythm, heart rate 66, normal axis, T wave inversions in 2, 3, aVF, V3-V6, no change from prior tracing dated 07/23/11  ASSESSMENT AND PLAN:

## 2011-08-13 ENCOUNTER — Telehealth: Payer: Self-pay | Admitting: Cardiology

## 2011-08-13 NOTE — Telephone Encounter (Signed)
Fu call Pt calling back about test results

## 2011-08-13 NOTE — Telephone Encounter (Signed)
Patient was called and told Bmet ok except elevated glucose 155.States she has appointment with PCP.

## 2011-11-29 ENCOUNTER — Other Ambulatory Visit: Payer: Self-pay

## 2011-11-29 MED ORDER — POTASSIUM CHLORIDE CRYS ER 20 MEQ PO TBCR: 20.0000 meq | EXTENDED_RELEASE_TABLET | Freq: Every day | ORAL | Status: DC

## 2011-11-29 MED ORDER — METOPROLOL SUCCINATE ER 50 MG PO TB24: 50.0000 mg | ORAL_TABLET | Freq: Every day | ORAL | Status: DC

## 2011-11-29 MED ORDER — HYDROCHLOROTHIAZIDE 25 MG PO TABS: 25.0000 mg | ORAL_TABLET | Freq: Every day | ORAL | Status: DC

## 2011-11-30 ENCOUNTER — Other Ambulatory Visit: Payer: Self-pay

## 2011-11-30 MED ORDER — POTASSIUM CHLORIDE CRYS ER 20 MEQ PO TBCR: 20.0000 meq | EXTENDED_RELEASE_TABLET | Freq: Every day | ORAL | Status: DC

## 2011-11-30 NOTE — Telephone Encounter (Signed)
..   Requested Prescriptions   Signed Prescriptions Disp Refills  . potassium chloride SA (KLOR-CON M20) 20 MEQ tablet 90 tablet 4    Sig: Take 1 tablet (20 mEq total) by mouth daily.    Authorizing Provider: Gaylord Shih    Ordering User: Lacie Scotts

## 2012-06-07 ENCOUNTER — Other Ambulatory Visit: Payer: Self-pay | Admitting: Cardiology

## 2012-12-08 ENCOUNTER — Other Ambulatory Visit: Payer: Self-pay | Admitting: Cardiology

## 2012-12-12 ENCOUNTER — Telehealth: Payer: Self-pay

## 2012-12-12 MED ORDER — HYDROCHLOROTHIAZIDE 25 MG PO TABS: ORAL_TABLET | ORAL | Status: DC

## 2012-12-12 NOTE — Telephone Encounter (Signed)
#  33, 0RF until next appointment (01/12/13 gerhardt)

## 2012-12-19 ENCOUNTER — Other Ambulatory Visit: Payer: Self-pay | Admitting: *Deleted

## 2012-12-19 ENCOUNTER — Telehealth: Payer: Self-pay | Admitting: Cardiology

## 2012-12-19 MED ORDER — EZETIMIBE-SIMVASTATIN 10-40 MG PO TABS: 1.0000 | ORAL_TABLET | Freq: Every day | ORAL | Status: DC

## 2012-12-19 NOTE — Telephone Encounter (Signed)
New Prob     Pt calling in requesting a new prescription of VYTORIN.

## 2012-12-19 NOTE — Telephone Encounter (Signed)
done

## 2013-01-12 ENCOUNTER — Ambulatory Visit: Payer: Managed Care, Other (non HMO) | Admitting: Nurse Practitioner

## 2013-01-12 ENCOUNTER — Encounter: Payer: Self-pay | Admitting: Cardiology

## 2013-01-12 ENCOUNTER — Ambulatory Visit (INDEPENDENT_AMBULATORY_CARE_PROVIDER_SITE_OTHER): Payer: Managed Care, Other (non HMO) | Admitting: Cardiology

## 2013-01-12 VITALS — BP 108/60 | HR 55 | Ht 67.0 in | Wt 170.8 lb

## 2013-01-12 DIAGNOSIS — I059 Rheumatic mitral valve disease, unspecified: Secondary | ICD-10-CM

## 2013-01-12 DIAGNOSIS — R0602 Shortness of breath: Secondary | ICD-10-CM

## 2013-01-12 DIAGNOSIS — I2510 Atherosclerotic heart disease of native coronary artery without angina pectoris: Secondary | ICD-10-CM

## 2013-01-12 MED ORDER — HYDROCHLOROTHIAZIDE 25 MG PO TABS: ORAL_TABLET | ORAL | Status: DC

## 2013-01-12 MED ORDER — METOPROLOL SUCCINATE ER 50 MG PO TB24: 50.0000 mg | ORAL_TABLET | Freq: Every day | ORAL | Status: DC

## 2013-01-12 MED ORDER — EZETIMIBE-SIMVASTATIN 10-40 MG PO TABS: 1.0000 | ORAL_TABLET | Freq: Every day | ORAL | Status: DC

## 2013-01-12 MED ORDER — VARENICLINE TARTRATE 0.5 MG PO TABS: 0.5000 mg | ORAL_TABLET | Freq: Two times a day (BID) | ORAL | Status: DC

## 2013-01-12 NOTE — Assessment & Plan Note (Signed)
No change. Sleeps well with several pillows. We have never been able explain her degree of symptoms. Exam unchanged reassurance given. Encouraged to stop smoking.

## 2013-01-12 NOTE — Patient Instructions (Addendum)
Your doctor has prescribed Chantix to help you in your efforts to quit smoking.  Your physician recommends that you continue on your current medications as directed. Please refer to the Current Medication list given to you today.  Your physician wants you to follow-up in: 1 year. You will receive a reminder letter in the mail two months in advance. If you don't receive a letter, please call our office to schedule the follow-up appointment.

## 2013-01-12 NOTE — Assessment & Plan Note (Signed)
No change in exam. We'll not perform repeat echocardiogram.

## 2013-01-12 NOTE — Progress Notes (Signed)
HPI Bonnie Ryan returns today for evaluation and management mitral valve prolapse and history of nonobstructive coronary disease. Symptomatically she is about the same with orthopnea which we've never been explained. Continues to smoke but has cut way back. She wants to quit and ask for Chantix. She denies any angina. Her edema has improved.  Past Medical History  Diagnosis Date  . Mitral valve prolapse     moderate by echo 10/2009 with mild MR;  Echocardiogram 07/23/11: Normal LV Bonnie Ryan thickness, EF 60%, normal Bonnie Ryan motion, moderately severe bileaflet mitral valve prolapse with mild-moderate MR.  This was not felt to be significantly different from prior echo;  RHC 12/12: RA 2, RV 19/2, PA 18/6 with a mean of 12, PCWP 4, LV 103/57 with a mean of 76, AO 103/10;  CO 3.7, CI 1.9, O2 - PA 67%, Ao 97%  . Hypertension   . Diabetes mellitus     Previously diet-controlled  . Hypercholesteremia   . CAD (coronary artery disease)     Nonbstructive CAD by cath 09/2006 30% LAD, 25% RCA. Normal EF of 55-60% by echo 10/2009 with normal Bonnie Ryan thickness.; ETT-Myoview 07/28/11: Normal without ischemia, EF 75%. ; LHC 12/12:   LM calcified, mod LAD calcification, mid LAD 30-40%, prox RCA 30-40%, EF 55-65%, 2+ MR    Current Outpatient Prescriptions  Medication Sig Dispense Refill  . ezetimibe-simvastatin (VYTORIN) 10-40 MG per tablet Take 1 tablet by mouth at bedtime. Needs to be seen for additional refills.  30 tablet  0  . hydrochlorothiazide (HYDRODIURIL) 25 MG tablet TAKE 1 TABLET BY MOUTH DAILY.  33 tablet  0  . metoprolol succinate (TOPROL-XL) 50 MG 24 hr tablet TAKE 1 TABLET BY MOUTH DAILY.  90 tablet  1   No current facility-administered medications for this visit.    Allergies  Allergen Reactions  . Penicillins Other (See Comments)    Convulsions     Family History  Problem Relation Age of Onset  . Hypertension Father   . Diabetes Mother     History   Social History  . Marital Status: Divorced     Spouse Name: N/A    Number of Children: N/A  . Years of Education: N/A   Occupational History  . Not on file.   Social History Main Topics  . Smoking status: Current Every Day Smoker -- 0.50 packs/day for 20 years    Types: Cigarettes  . Smokeless tobacco: Not on file  . Alcohol Use: Yes     Comment: 4  . Drug Use: No  . Sexually Active: Not on file   Other Topics Concern  . Not on file   Social History Narrative  . No narrative on file    ROS ALL NEGATIVE EXCEPT THOSE NOTED IN HPI  PE  General Appearance: well developed, well nourished in no acute distress HEENT: symmetrical face, PERRLA, good dentition  Neck: no JVD, thyromegaly, or adenopathy, trachea midline Chest: symmetric without deformity Cardiac: PMI non-displaced, RRR, normal S1, S2, no gallop, midsystolic prolapse murmur grade 3/6, unchanged Lung: clear to ausculation and percussion Vascular: all pulses full without bruits  Abdominal: nondistended, nontender, good bowel sounds, no HSM, no bruits Extremities: no cyanosis, clubbing or edema, no sign of DVT, no varicosities  Skin: normal color, no rashes Neuro: alert and oriented x 3, non-focal Pysch: normal affect  EKG Sinus bradycardia, ST segment changes unchanged from previous EKG.  BMET    Component Value Date/Time   NA 141 08/11/2011 1019  K 3.7 08/11/2011 1019   CL 100 08/11/2011 1019   CO2 32 08/11/2011 1019   GLUCOSE 155* 08/11/2011 1019   BUN 18 08/11/2011 1019   CREATININE 0.9 08/11/2011 1019   CALCIUM 9.1 08/11/2011 1019   GFRNONAA 76* 07/23/2011 0620   GFRAA 88* 07/23/2011 0620    Lipid Panel     Component Value Date/Time   CHOL 187 12/23/2006 0846   TRIG 111 12/23/2006 0846   HDL 53.9 12/23/2006 0846   CHOLHDL 3.5 CALC 12/23/2006 0846   VLDL 22 12/23/2006 0846   LDLCALC 111* 12/23/2006 0846    CBC    Component Value Date/Time   WBC 6.0 07/30/2011 1401   RBC 4.51 07/30/2011 1401   HGB 15.9* 07/30/2011 1401   HCT 46.2* 07/30/2011  1401   PLT 168.0 07/30/2011 1401   MCV 102.4* 07/30/2011 1401   MCH 33.8 07/23/2011 0026   MCHC 34.4 07/30/2011 1401   RDW 14.0 07/30/2011 1401   LYMPHSABS 2.4 07/30/2011 1401   MONOABS 0.5 07/30/2011 1401   EOSABS 0.2 07/30/2011 1401   BASOSABS 0.0 07/30/2011 1401

## 2013-01-12 NOTE — Assessment & Plan Note (Signed)
Stable. Continue secondary preventative therapy. Strongly urged to quit smoking. Chantix low dose prescribed.

## 2013-02-26 ENCOUNTER — Encounter (HOSPITAL_COMMUNITY): Payer: Self-pay | Admitting: Emergency Medicine

## 2013-02-26 ENCOUNTER — Emergency Department (HOSPITAL_COMMUNITY): Payer: Managed Care, Other (non HMO)

## 2013-02-26 ENCOUNTER — Emergency Department (HOSPITAL_COMMUNITY)
Admission: EM | Admit: 2013-02-26 | Discharge: 2013-02-26 | Disposition: A | Discharge status: Home / Self Care | Payer: Managed Care, Other (non HMO) | Attending: Emergency Medicine | Admitting: Emergency Medicine

## 2013-02-26 ENCOUNTER — Telehealth: Payer: Self-pay | Admitting: Cardiology

## 2013-02-26 DIAGNOSIS — E119 Type 2 diabetes mellitus without complications: Secondary | ICD-10-CM | POA: Insufficient documentation

## 2013-02-26 DIAGNOSIS — T50905A Adverse effect of unspecified drugs, medicaments and biological substances, initial encounter: Secondary | ICD-10-CM

## 2013-02-26 DIAGNOSIS — R9431 Abnormal electrocardiogram [ECG] [EKG]: Secondary | ICD-10-CM

## 2013-02-26 DIAGNOSIS — R110 Nausea: Secondary | ICD-10-CM | POA: Insufficient documentation

## 2013-02-26 DIAGNOSIS — R0602 Shortness of breath: Secondary | ICD-10-CM | POA: Insufficient documentation

## 2013-02-26 DIAGNOSIS — R5381 Other malaise: Secondary | ICD-10-CM | POA: Insufficient documentation

## 2013-02-26 DIAGNOSIS — I2510 Atherosclerotic heart disease of native coronary artery without angina pectoris: Secondary | ICD-10-CM | POA: Insufficient documentation

## 2013-02-26 DIAGNOSIS — F17200 Nicotine dependence, unspecified, uncomplicated: Secondary | ICD-10-CM | POA: Insufficient documentation

## 2013-02-26 DIAGNOSIS — Z880 Allergy status to penicillin: Secondary | ICD-10-CM | POA: Insufficient documentation

## 2013-02-26 DIAGNOSIS — R079 Chest pain, unspecified: Secondary | ICD-10-CM | POA: Insufficient documentation

## 2013-02-26 DIAGNOSIS — R011 Cardiac murmur, unspecified: Secondary | ICD-10-CM | POA: Insufficient documentation

## 2013-02-26 DIAGNOSIS — T368X5A Adverse effect of other systemic antibiotics, initial encounter: Secondary | ICD-10-CM | POA: Insufficient documentation

## 2013-02-26 DIAGNOSIS — R42 Dizziness and giddiness: Secondary | ICD-10-CM | POA: Insufficient documentation

## 2013-02-26 DIAGNOSIS — I10 Essential (primary) hypertension: Secondary | ICD-10-CM | POA: Insufficient documentation

## 2013-02-26 DIAGNOSIS — Z79899 Other long term (current) drug therapy: Secondary | ICD-10-CM | POA: Insufficient documentation

## 2013-02-26 DIAGNOSIS — E7800 Pure hypercholesterolemia, unspecified: Secondary | ICD-10-CM | POA: Insufficient documentation

## 2013-02-26 DIAGNOSIS — R230 Cyanosis: Secondary | ICD-10-CM | POA: Insufficient documentation

## 2013-02-26 DIAGNOSIS — Z8679 Personal history of other diseases of the circulatory system: Secondary | ICD-10-CM | POA: Insufficient documentation

## 2013-02-26 LAB — COMPREHENSIVE METABOLIC PANEL
ALT: 40 U/L — ABNORMAL HIGH (ref 0–35)
Alkaline Phosphatase: 77 U/L (ref 39–117)
BUN: 15 mg/dL (ref 6–23)
Calcium: 9.8 mg/dL (ref 8.4–10.5)
Chloride: 95 mEq/L — ABNORMAL LOW (ref 96–112)
Creatinine, Ser: 0.75 mg/dL (ref 0.50–1.10)
GFR calc Af Amer: >90 mL/min (ref >90)
GFR calc non Af Amer: >90 mL/min (ref >90)
Total Bilirubin: 0.7 mg/dL (ref 0.3–1.2)
Total Protein: 7.7 g/dL (ref 6.0–8.3)

## 2013-02-26 LAB — POCT I-STAT TROPONIN I: Troponin i, poc: 0 ng/mL (ref 0.00–0.08)

## 2013-02-26 LAB — CBC
MCHC: 35.8 g/dL (ref 30.0–36.0)
Platelets: 155 10*3/uL (ref 150–400)
RDW: 12.9 % (ref 11.5–15.5)

## 2013-02-26 NOTE — ED Notes (Addendum)
Noticed that lips and tongue were turning blue  Has hx of MVRegur Last week saw dr today and was sent for further tests for changes in her ekg from 2 years ago has been more sob and lightheaded and tired

## 2013-02-26 NOTE — ED Notes (Signed)
Pt alert and mentating appropriately upon d/c. Pt given d/c teaching and follow up care instructions. Pt verbalizes understanding of d/c teaching and has no further questions upon d/c. Pt ambulatory leaving ED. NAD noted upon d/c.

## 2013-02-26 NOTE — Telephone Encounter (Signed)
Pt c/o bluish lips and tongue, describes no change in her usual SOB except c/o fatigue and sob when lays down. Denies wt gain no edema and states she feels fine otherwise but her family wanted her to call. Advised her to start with her pcp and see if further review is needed. Told pt I will forward to Dr Daleen Squibb nurse to further advise, no avail app this week on Dr Daleen Squibb schedule.

## 2013-02-26 NOTE — Telephone Encounter (Signed)
pcp saw pt, sent her to ED, spo2 was 97% but said ekg was diff than 2 years ago, [address]

## 2013-02-26 NOTE — Consult Note (Addendum)
Referring Physician: ER Primary Physician: Primary Cardiologist: TW Reason for Consultation: Blue lips and tongue   HPI:  Bonnie Ryan is a delightful 57 y/o smoker with a h/o of bileaflet MVP with mild-moderate MR and non-obstructive CAD on cath in 2012.  She was referred to the ER today by her PCP for bluish discoloration of lips and tongue (see pictures below) and abnormal ECG.  She recently saw Dr. Daleen Squibb and was doing well. She has chronic mild orthopnea but no significant DOE, PND, or CP. Does typically smoke 5 cigs per day but has stopped over past few days. She feels a bit unsteady at times but no focal neuro complaints.  Her baseline ECG has diffuse ST and T wave abnormalities which were more prominent on the ECG at her PCP's office but scale was also increased.   Denies any new foods, toothpaste, change in cigarettes or other facial products. She does say she had a spider bite several weeks ago and was treated with bactrim.  I walked her around the ER and sats stayed 98% on RA.     Review of Systems:     Cardiac Review of Systems: {Y] = yes [ ]  = no  Chest Pain [    ]  Resting SOB [   ] Exertional SOB  [  ]  Orthopnea [place]  ]   Pedal Edema [   ]    Palpitations [  ] Syncope  [  ]   Presyncope [   ]  General Review of Systems: [Y] = yes [  ]=no Constitional: recent weight change [  ]; anorexia [  ]; fatigue [  ]; nausea [  ]; night sweats [  ]; fever [  ]; or chills [  ];                                                                                                                                          Dental: poor dentition[  ];   Eye : blurred vision [  ]; diplopia [   ]; vision changes [  ];  Amaurosis fugax[  ]; Resp: cough [  ];  wheezing[  ];  hemoptysis[  ]; shortness of breath[  ]; paroxysmal nocturnal dyspnea[  ]; dyspnea on exertion[  ]; or orthopnea[y  ];  GI:  gallstones[  ], vomiting[  ];  dysphagia[  ]; melena[  ];  hematochezia [  ]; heartburn[  ];   Hx of   Colonoscopy[  ]; GU: kidney stones [  ]; hematuria[  ];   dysuria [  ];  nocturia[  ];  history of     obstruction [  ];                 Skin: rash, swelling[  ];, hair loss[  ];  peripheral edema[  ];  or itching[  ];  Musculosketetal: myalgias[  ];  joint swelling[  ];  joint erythema[  ];  joint pain[  ];  back pain[  ];  Heme/Lymph: bruising[  ];  bleeding[  ];  anemia[  ];  Neuro: TIA[  ];  headaches[  ];  stroke[  ];  vertigo[  ];  seizures[  ];   paresthesias[  ];  difficulty walking[  ];  Psych:depression[  ]; anxiety[  ];  Endocrine: diabetes[  ];  thyroid dysfunction[  ];  Immunizations: Flu [  ]; Pneumococcal[  ];  Other:  Past Medical History  Diagnosis Date  . Mitral valve prolapse     moderate by echo 10/2009 with mild MR;  Echocardiogram 07/23/11: Normal LV wall thickness, EF 60%, normal wall motion, moderately severe bileaflet mitral valve prolapse with mild-moderate MR.  This was not felt to be significantly different from prior echo;  RHC 12/12: RA 2, RV 19/2, PA 18/6 with a mean of 12, PCWP 4, LV 103/57 with a mean of 76, AO 103/10;  CO 3.7, CI 1.9, O2 - PA 67%, Ao 97%  . Hypertension   . Diabetes mellitus     Previously diet-controlled  . Hypercholesteremia   . CAD (coronary artery disease)     Nonbstructive CAD by cath 09/2006 30% LAD, 25% RCA. Normal EF of 55-60% by echo 10/2009 with normal wall thickness.; ETT-Myoview 07/28/11: Normal without ischemia, EF 75%. ; LHC 12/12:   LM calcified, mod LAD calcification, mid LAD 30-40%, prox RCA 30-40%, EF 55-65%, 2+ MR     (Not in a hospital admission)     Infusions:   Allergies  Allergen Reactions  . Penicillins Other (See Comments)    Convulsions     History   Social History  . Marital Status: Divorced    Spouse Name: N/A    Number of Children: N/A  . Years of Education: N/A   Occupational History  . Not on file.   Social History Main Topics  . Smoking status: Current Every Day Smoker -- 0.50 packs/day  for 20 years    Types: Cigarettes  . Smokeless tobacco: Not on file  . Alcohol Use: Yes     Comment: 4  . Drug Use: No  . Sexually Active: Not on file   Other Topics Concern  . Not on file   Social History Narrative  . No narrative on file    Family History  Problem Relation Age of Onset  . Hypertension Father   . Diabetes Mother     PHYSICAL EXAM: Filed Vitals:   02/26/13 1654  BP: 130/85  Pulse: 62  Temp:   Resp: 13    No intake or output data in the 24 hours ending 02/26/13 1737  General:  Well appearing. No respiratory difficulty HEENT: blue/purple discoloration of lips and tongue Neck: supple. no JVD. Carotids 2+ bilat; no bruits. No lymphadenopathy or thryomegaly appreciated. Cor: PMI nondisplaced. Regular rate & rhythm. 2/6 midsystolic murmur at apex Lungs: clear Abdomen: soft, nontender, nondistended. No hepatosplenomegaly. No bruits or masses. Good bowel sounds. Extremities: no cyanosis, clubbing, rash, edema Neuro: alert & oriented x 3, cranial nerves grossly intact. moves all 4 extremities w/o difficulty. Affect pleasant.  ECG: NSR diffuse ST and T wave abnormalities. More prominent than previous.   Results for orders placed during the hospital encounter of 02/26/13 (from the past 24 hour(s))  COMPREHENSIVE METABOLIC PANEL     Status: Abnormal   Collection Time    02/26/13  2:10 PM      Result Value Range   Sodium 137  135 - 145 mEq/L   Potassium 3.3 (*) 3.5 - 5.1 mEq/L   Chloride 95 (*) 96 - 112 mEq/L   CO2 28  19 - 32 mEq/L   Glucose, Bld 139 (*) 70 - 99 mg/dL   BUN 15  6 - 23 mg/dL   Creatinine, Ser 8.11  0.50 - 1.10 mg/dL   Calcium 9.8  8.4 - 91.4 mg/dL   Total Protein 7.7  6.0 - 8.3 g/dL   Albumin 4.5  3.5 - 5.2 g/dL   AST 35  0 - 37 U/L   ALT 40 (*) 0 - 35 U/L   Alkaline Phosphatase 77  39 - 117 U/L   Total Bilirubin 0.7  0.3 - 1.2 mg/dL   GFR calc non Af Amer >90  >90 mL/min   GFR calc Af Amer >90  >90 mL/min  CBC     Status: Abnormal    Collection Time    02/26/13  2:10 PM      Result Value Range   WBC 5.0  4.0 - 10.5 K/uL   RBC 4.59  3.87 - 5.11 MIL/uL   Hemoglobin 15.5 (*) 12.0 - 15.0 g/dL   HCT 78.2  95.6 - 21.3 %   MCV 94.3  78.0 - 100.0 fL   MCH 33.8  26.0 - 34.0 pg   MCHC 35.8  30.0 - 36.0 g/dL   RDW 08.6  57.8 - 46.9 %   Platelets 155  150 - 400 K/uL  PRO B NATRIURETIC PEPTIDE     Status: Abnormal   Collection Time    02/26/13  2:10 PM      Result Value Range   Pro B Natriuretic peptide (BNP) 128.1 (*) 0 - 125 pg/mL  POCT I-STAT TROPONIN I     Status: None   Collection Time    02/26/13  2:32 PM      Result Value Range   Troponin i, poc 0.00  0.00 - 0.08 ng/mL   Comment 3            Dg Chest 2 View  02/26/2013   *RADIOLOGY REPORT*  Clinical Data: Chest pain and shortness of breath  CHEST - 2 VIEW  Comparison: 07/22/2011  Findings: The lungs are clear without focal consolidation, edema, effusion or pneumothorax.  Cardiopericardial silhouette is within normal limits for size.  Imaged bony structures of the thorax are intact.  IMPRESSION: Stable.  No acute findings.   Original Report Authenticated By: Kennith Center, M.D.     ASSESSMENT: 1. Bluish discoloration of lips and tongue 2. Abnormal ECG  3. Tobacco use ongoing 4. MVP 5. Nonobstructive CAD by cath  PLAN/DISCUSSION:  I have walked her in the ER and she did not desaturate. There is no evidence of hypoxia and discoloration of lips and tongue do not appear cyanotic in origin. In looking it up, bactrim therapy can be associated with bluish/purple patches on tongue, lips and skin and I suspect this is the causative agent and should resolve over time.   As for her ECG, her ST-T wave abnormalities are a bit more prominent than previous ECG but the scale on the ECG is also bigger. Her ECG in the ER is not much changed from previous so I do not suspect an acute issue here. She is due for repeat echo and we will schedule that as an outpatient.   On walking,  she was just mildly unsteady on her feet and smelled vaguely like ETOH. She denies significant ETOH use. Will check ETOH level for completeness sake.   Truman Hayward 5:48 PM

## 2013-02-26 NOTE — Telephone Encounter (Signed)
Returned call to patient no answer.LMTC. 

## 2013-02-26 NOTE — ED Provider Notes (Signed)
History    CSN: [id number] Arrival date & time 02/26/13  1359  First MD Initiated Contact with Patient 02/26/13 1508     Chief Complaint  Patient presents with  . Chest Pain   (Consider location/radiation/quality/duration/timing/severity/associated sxs/prior Treatment) HPI Comments: 57 year old female with a past medical history of mitral bowel prolapse, mitral regurgitation, hypertension, diabetes, high cholesterol and CAD presents the emergency department with increasing shortness of breath, fatigue and lightheadedness over the past week. Also states her lips turned blue 5 days ago on Thursday. She did not think anything of the blue lips and bleeders symptoms were general side effects of mitral valve prolapse, however her daughter-in-law advised her to go to her primary care physician today. Patient saw her primary care physician at [facility] physicians who performed EKG and noticed ST depression changes and advised her to go to the emergency department. Patient states she's always short of breath when laying down, however her shortness of breath has increased throughout the day without any certain position. She's feeling very tired. Noticed that her hands and feet are more cold than normal. She has been slightly nauseated but not to the point where it concerns her. Denies chest pain, cough, fever or chills, weight gain or peripheral edema. Last saw her cardiologist with [facility] cardiology in may without any medication changes. Last echocardiogram was in 2012.  Patient is a 57 y.o. female presenting with chest pain. The history is provided by the patient.  Chest Pain Associated symptoms: fatigue, nausea and shortness of breath   Associated symptoms: no abdominal pain, no back pain, no cough, no fever, no numbness and not vomiting    Past Medical History  Diagnosis Date  . Mitral valve prolapse     moderate by echo 10/2009 with mild MR;  Echocardiogram 07/23/11: Normal LV wall thickness, EF 60%,  normal wall motion, moderately severe bileaflet mitral valve prolapse with mild-moderate MR.  This was not felt to be significantly different from prior echo;  RHC 12/12: RA 2, RV 19/2, PA 18/6 with a mean of 12, PCWP 4, LV 103/57 with a mean of 76, AO 103/10;  CO 3.7, CI 1.9, O2 - PA 67%, Ao 97%  . Hypertension   . Diabetes mellitus     Previously diet-controlled  . Hypercholesteremia   . CAD (coronary artery disease)     Nonbstructive CAD by cath 09/2006 30% LAD, 25% RCA. Normal EF of 55-60% by echo 10/2009 with normal wall thickness.; ETT-Myoview 07/28/11: Normal without ischemia, EF 75%. ; LHC 12/12:   LM calcified, mod LAD calcification, mid LAD 30-40%, prox RCA 30-40%, EF 55-65%, 2+ MR   Past Surgical History  Procedure Laterality Date  . Appendectomy    . Knee surgery    . Abdominal hysterectomy    . Cholecystectomy    . US echocardiography      EF 55-60%   Family History  Problem Relation Age of Onset  . Hypertension Father   . Diabetes Mother    History  Substance Use Topics  . Smoking status: Current Every Day Smoker -- 0.50 packs/day for 20 years    Types: Cigarettes  . Smokeless tobacco: Not on file  . Alcohol Use: Yes     Comment: 4   OB History   Grav Para Term Preterm Abortions TAB SAB Ect Mult Living                 Review of Systems  Constitutional: Positive for fatigue. Negative for fever,  chills and unexpected weight change.  Respiratory: Positive for shortness of breath. Negative for cough.   Cardiovascular: Negative for chest pain and leg swelling.  Gastrointestinal: Positive for nausea. Negative for vomiting and abdominal pain.  Musculoskeletal: Negative for back pain.  Skin: Positive for color change.  Neurological: Positive for light-headedness. Negative for syncope and numbness.  Psychiatric/Behavioral: Negative for confusion.  All other systems reviewed and are negative.    Allergies  Penicillins  Home Medications   Current Outpatient Rx   Name  Route  Sig  Dispense  Refill  . ezetimibe-simvastatin (VYTORIN) 10-40 MG per tablet   Oral   Take 1 tablet by mouth at bedtime. Needs to be seen for additional refills.   90 tablet   3   . metoprolol succinate (TOPROL-XL) 50 MG 24 hr tablet   Oral   Take 1 tablet (50 mg total) by mouth daily. Take with or immediately following a meal.   90 tablet   3   . varenicline (CHANTIX) 0.5 MG tablet   Oral   Take 1 tablet (0.5 mg total) by mouth 2 (two) times daily.   60 tablet   3   . hydrochlorothiazide (HYDRODIURIL) 25 MG tablet      TAKE 1 TABLET BY MOUTH DAILY.   90 tablet   3     Must come to appointment for further refills    BP 149/84  Pulse 64  Temp(Src) 98.4 F (36.9 C)  Resp 15  SpO2 100% Physical Exam  Nursing note and vitals reviewed. Constitutional: She is oriented to person, place, and time. She appears well-developed and well-nourished. No distress.  HENT:  Head: Normocephalic and atraumatic.  Mouth/Throat: Uvula is midline. Mucous membranes are cyanotic (tongue, buccal mucosa, lips).  Eyes: Conjunctivae and EOM are normal. Pupils are equal, round, and reactive to light. No scleral icterus.  Neck: Normal range of motion. Neck supple. No JVD present.  Cardiovascular: Normal rate and regular rhythm.   Murmur heard.  Systolic murmur is present  Pulses:      Carotid pulses are 2+ on the right side, and 2+ on the left side.      Radial pulses are 2+ on the right side, and 2+ on the left side.       Dorsalis pedis pulses are 2+ on the right side, and 2+ on the left side.       Posterior tibial pulses are 2+ on the right side, and 2+ on the left side.  Mid-systolic click and blowing systolic murmur. No extremity edema.  Pulmonary/Chest: Effort normal and breath sounds normal. No respiratory distress. She has no wheezes. She has no rales.  Abdominal: Soft. Bowel sounds are normal. She exhibits no distension. There is no tenderness.  Musculoskeletal: Normal  range of motion. She exhibits no edema.  Neurological: She is alert and oriented to person, place, and time.  Skin: Skin is warm and dry. She is not diaphoretic. No pallor.  Psychiatric: She has a normal mood and affect. Her behavior is normal.    ED Course  Procedures (including critical care time) Labs Reviewed  COMPREHENSIVE METABOLIC PANEL - Abnormal; Notable for the following:    Potassium 3.3 (*)    Chloride 95 (*)    Glucose, Bld 139 (*)    ALT 40 (*)    All other components within normal limits  CBC - Abnormal; Notable for the following:    Hemoglobin 15.5 (*)    All other components within  normal limits  PRO B NATRIURETIC PEPTIDE  POCT I-STAT TROPONIN I   Dg Chest 2 View  02/26/2013   *RADIOLOGY REPORT*  Clinical Data: Chest pain and shortness of breath  CHEST - 2 VIEW  Comparison: 07/22/2011  Findings: The lungs are clear without focal consolidation, edema, effusion or pneumothorax.  Cardiopericardial silhouette is within normal limits for size.  Imaged bony structures of the thorax are intact.  IMPRESSION: Stable.  No acute findings.   Original Report Authenticated By: Kennith Center, M.D.    Date: 02/26/2013  Rate: 62  Rhythm: normal sinus rhythm  QRS Axis: normal  Intervals: normal  ST/T Wave abnormalities: ST elevations inferiorly, ST elevations laterally and ST depressions anteriorly  Conduction Disutrbances:none  Narrative Interpretation: no stemi  Old EKG Reviewed: changes noted ST depression stated above slightly more prominent compared to EKG from 01/12/2013   1. Medication reaction, initial encounter     MDM  Patient with cyanosis of lips, tongue, buccal mucosa. Increasing dyspnea, fatigue. Hx of MVP, mitral regurg. ST depression more prominent on EKG compared to prior. She is in NAD. No extremity edema, weight gain. Lungs clear. She is in NAD. Vitals stable. Troponin negative, CXR without any changes, BNP 128.1. Consult to cardiology appreciated who will  evaluate patient. Case discussed with Dr. Jeraldine Loots who also evaluated patient and agrees with plan of care.  5:40 PM Patient seen by Dr. Adriana Reams with cardiology who does not feel this is cyanosis rather a reaction from Bactrim she was recently on from spider bite. He is not concerned from a cardiac standpoint and she is stable for discharge. Dr. Jeraldine Loots aware.   Trevor Mace, PA-C 02/26/13 1743

## 2013-02-26 NOTE — ED Notes (Signed)
Robyn, PA at bedside  

## 2013-02-26 NOTE — ED Notes (Signed)
Pt in radiology 

## 2013-02-26 NOTE — Telephone Encounter (Signed)
New Prob     Pt states she has been experiencing blueish/purple tint on her lips and tongue. Would like to speak to nurse regarding this.

## 2013-02-26 NOTE — Telephone Encounter (Signed)
No answer at home unable to leave message Mylo Red RN

## 2013-02-26 NOTE — ED Notes (Signed)
Cardiology at bedside.

## 2013-02-27 NOTE — ED Provider Notes (Signed)
  This was a shared visit with a mid-level provided (NP or PA).  Throughout the patient's course I was available for consultation/collaboration.  I saw the ECG (if appropriate), relevant labs and studies - I agree with the interpretation.  On my exam the patient was in no distress.  She had notably blue lips and tongue.  With her history of valvular issue, there is some suspicion of hypoxic state, though she is no overt evidence of fluid congestion, nor edema, nor weight gain.  After consultation with our cardiology colleagues it became clear that the patient had likely drug reaction.  Absent distress, with reassuring labs, she is discharged to follow up with her primary care physician      Gerhard Munch, MD 02/27/13 [phone]

## 2013-02-28 NOTE — Telephone Encounter (Signed)
I am aware. We can try to call her again tomorrow when him in the office.

## 2014-01-29 ENCOUNTER — Other Ambulatory Visit: Payer: Self-pay | Admitting: Cardiology

## 2014-02-07 ENCOUNTER — Other Ambulatory Visit: Payer: Self-pay | Admitting: Cardiology

## 2014-09-30 ENCOUNTER — Other Ambulatory Visit: Payer: Self-pay | Admitting: Physician Assistant

## 2014-10-23 ENCOUNTER — Other Ambulatory Visit: Payer: Self-pay | Admitting: Family Medicine

## 2014-10-23 DIAGNOSIS — Z139 Encounter for screening, unspecified: Secondary | ICD-10-CM

## 2014-12-01 ENCOUNTER — Other Ambulatory Visit: Payer: Self-pay | Admitting: Physician Assistant

## 2015-01-19 ENCOUNTER — Other Ambulatory Visit: Payer: Self-pay | Admitting: Cardiology

## 2015-01-21 NOTE — Telephone Encounter (Signed)
Will route this message to Dr Delton SeeNelson to further review and advise on medication refill.

## 2015-01-21 NOTE — Telephone Encounter (Signed)
Patient has not seen a provider in this office since 2014 and is was overdue. She was a previous Dr Daleen SquibbWall patient. Please advise on refill. Thanks, MI

## 2015-10-02 ENCOUNTER — Encounter: Payer: Self-pay | Admitting: Emergency Medicine

## 2015-10-02 ENCOUNTER — Ambulatory Visit (INDEPENDENT_AMBULATORY_CARE_PROVIDER_SITE_OTHER): Payer: Managed Care, Other (non HMO)

## 2015-10-02 ENCOUNTER — Ambulatory Visit
Admission: EM | Admit: 2015-10-02 | Discharge: 2015-10-02 | Disposition: A | Discharge status: Home / Self Care | Payer: Managed Care, Other (non HMO) | Attending: Family Medicine | Admitting: Family Medicine

## 2015-10-02 DIAGNOSIS — M79672 Pain in left foot: Secondary | ICD-10-CM

## 2015-10-02 DIAGNOSIS — R7989 Other specified abnormal findings of blood chemistry: Secondary | ICD-10-CM | POA: Diagnosis not present

## 2015-10-02 DIAGNOSIS — M79675 Pain in left toe(s): Secondary | ICD-10-CM

## 2015-10-02 DIAGNOSIS — E790 Hyperuricemia without signs of inflammatory arthritis and tophaceous disease: Secondary | ICD-10-CM

## 2015-10-02 LAB — COMPREHENSIVE METABOLIC PANEL
ALT: 25 U/L (ref 14–54)
AST: 21 U/L (ref 15–41)
Albumin: 4.5 g/dL (ref 3.5–5.0)
Alkaline Phosphatase: 59 U/L (ref 38–126)
Anion gap: 8 (ref 5–15)
BUN: 15 mg/dL (ref 6–20)
CHLORIDE: 100 mmol/L — AB (ref 101–111)
CO2: 31 mmol/L (ref 22–32)
Calcium: 9.4 mg/dL (ref 8.9–10.3)
Creatinine, Ser: 0.7 mg/dL (ref 0.44–1.00)
Glucose, Bld: 139 mg/dL — ABNORMAL HIGH (ref 65–99)
POTASSIUM: 3.6 mmol/L (ref 3.5–5.1)
Sodium: 139 mmol/L (ref 135–145)
Total Bilirubin: 0.8 mg/dL (ref 0.3–1.2)
Total Protein: 7.5 g/dL (ref 6.5–8.1)

## 2015-10-02 LAB — CBC WITH DIFFERENTIAL/PLATELET
Basophils Absolute: 0.1 10*3/uL (ref 0–0.1)
Basophils Relative: 1 %
EOS ABS: 0.3 10*3/uL (ref 0–0.7)
Eosinophils Relative: 4 %
HCT: 42.1 % (ref 35.0–47.0)
HEMOGLOBIN: 14.4 g/dL (ref 12.0–16.0)
LYMPHS ABS: 2.2 10*3/uL (ref 1.0–3.6)
Lymphocytes Relative: 34 %
MCH: 32.1 pg (ref 26.0–34.0)
MCHC: 34.2 g/dL (ref 32.0–36.0)
MCV: 93.8 fL (ref 80.0–100.0)
MONOS PCT: 8 %
Monocytes Absolute: 0.5 10*3/uL (ref 0.2–0.9)
NEUTROS ABS: 3.5 10*3/uL (ref 1.4–6.5)
NEUTROS PCT: 53 %
Platelets: 148 10*3/uL — ABNORMAL LOW (ref 150–440)
RBC: 4.48 MIL/uL (ref 3.80–5.20)
RDW: 13.3 % (ref 11.5–14.5)
WBC: 6.6 10*3/uL (ref 3.6–11.0)

## 2015-10-02 LAB — URIC ACID: URIC ACID, SERUM: 6.9 mg/dL — AB (ref 2.3–6.6)

## 2015-10-02 MED ORDER — COLCHICINE 0.6 MG PO TABS: 0.6000 mg | ORAL_TABLET | Freq: Every day | ORAL | Status: DC | PRN

## 2015-10-02 MED ORDER — MELOXICAM 15 MG PO TABS: 15.0000 mg | ORAL_TABLET | Freq: Every day | ORAL | Status: DC | PRN

## 2015-10-02 MED ORDER — COLCHICINE 0.6 MG PO TABS
1.2000 mg | ORAL_TABLET | Freq: Once | ORAL | Status: AC
  Administered 2015-10-02: 1.2 mg via ORAL

## 2015-10-02 MED ORDER — KETOROLAC TROMETHAMINE 60 MG/2ML IM SOLN
60.0000 mg | Freq: Once | INTRAMUSCULAR | Status: AC
  Administered 2015-10-02: 60 mg via INTRAMUSCULAR

## 2015-10-02 MED ORDER — METHYLPREDNISOLONE SODIUM SUCC 125 MG IJ SOLR
125.0000 mg | Freq: Once | INTRAMUSCULAR | Status: AC
  Administered 2015-10-02: 125 mg via INTRAMUSCULAR

## 2015-10-02 NOTE — ED Notes (Signed)
Patient states that on Sunday she slipped on her steps outside and her dog stepped on her left 1st toe.  Patient c/o ongoing pain and swelling in her left 1st toe and down on the right side of her left foot.

## 2015-10-02 NOTE — Discharge Instructions (Signed)
Cryotherapy Cryotherapy is when you put ice on your injury. Ice helps lessen pain and puffiness (swelling) after an injury. Ice works the best when you start using it in the first 24 to 48 hours after an injury. HOME CARE  Put a dry or damp towel between the ice pack and your skin.  You may press gently on the ice pack.  Leave the ice on for no more than 10 to 20 minutes at a time.  Check your skin after 5 minutes to make sure your skin is okay.  Rest at least 20 minutes between ice pack uses.  Stop using ice when your skin loses feeling (numbness).  Do not use ice on someone who cannot tell you when it hurts. This includes small children and people with memory problems (dementia). GET HELP RIGHT AWAY IF:  You have white spots on your skin.  Your skin turns blue or pale.  Your skin feels waxy or hard.  Your puffiness gets worse. MAKE SURE YOU:   Understand these instructions. Will watch your condition.Gout Gout is when your joints become red, sore, and swell (inflamed). This is caused by the buildup of uric acid crystals in the joints. Uric acid is a chemical that is normally in the blood. If the level of uric acid gets too high in the blood, these crystals form in your joints and tissues. Over time, these crystals can form into masses near the joints and tissues. These masses can destroy bone and cause the bone to look misshapen (deformed). HOME CARE  Do not take aspirin for pain. Only take medicine as told by your doctor. Rest the joint as much as you can. When in bed, keep sheets and blankets off painful areas. Keep the sore joints raised (elevated). Put warm or cold packs on painful joints. Use of warm or cold packs depends on which works best for you. Use crutches if the painful joint is in your leg. Drink enough fluids to keep your pee (urine) clear or pale yellow. Limit alcohol, sugary drinks, and drinks with fructose in them. Follow your diet instructions. Pay careful  attention to how much protein you eat. Include fruits, vegetables, whole grains, and fat-free or low-fat milk products in your daily diet. Talk to your doctor or dietitian about the use of coffee, vitamin C, and cherries. These may help lower uric acid levels. Keep a healthy body weight. GET HELP RIGHT AWAY IF:  You have watery poop (diarrhea), throw up (vomit), or have any side effects from medicines. You do not feel better in 24 hours, or you are getting worse. Your joint becomes suddenly more tender, and you have chills or a fever. MAKE SURE YOU:  Understand these instructions. Will watch your condition. Will get help right away if you are not doing well or get worse.   This information is not intended to replace advice given to you by your health care provider. Make sure you discuss any questions you have with your health care provider.   Document Released: 05/18/2008 Document Revised: 08/30/2014 Document Reviewed: 03/22/2012 Elsevier Interactive Patient Education [employer]    Will get help right away if you are not doing well or get worse.   This information is not intended to replace advice given to you by your health care provider. Make sure you discuss any questions you have with your health care provider.   Document Released: 01/26/2008 Document Revised: 11/01/2011 Document Reviewed: 04/01/2011 Elsevier Interactive Patient Education [employer]

## 2015-10-02 NOTE — ED Provider Notes (Signed)
CSN: [id number]     Arrival date & time 10/02/15  7829 History   First MD Initiated Contact with Patient 10/02/15 [phone]    Nurses notes were reviewed. Chief Complaint  Patient presents with  . Foot Pain   Patient reports left foot pain. She states that she did not have pain until Sunday she was taking her dog out when she slipped down some steps she thinks she hurt her toe but really wasn't painful then until a few hours later when she had some pain in the left foot and left toe. That did stay but over the last 2-3 days she's dose increased swelling redness she went to the gym on Tuesday but yesterday was unable to go to the gym. 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  She is a strong history of cardiac disease with coronary artery disease and mitral valve prolapse hypertension and diabetes. Abdominal hysterectomy cholecystectomy and appendectomy. Unfortunately she does still smoke occasionally she is on Chantix  Family medical history multiple people on mother's side of diabetes but this no history of gout also had hypertension.   (Consider location/radiation/quality/duration/timing/severity/associated sxs/prior Treatment) Patient is a 60 y.o. female presenting with lower extremity pain. The history is provided by the patient. No language interpreter was used.  Foot Pain This is a new problem. The current episode started more than 2 days ago. The problem occurs constantly. The problem has been rapidly worsening. Pertinent negatives include no chest pain, no abdominal pain, no headaches and no shortness of breath. The symptoms are aggravated by walking. Nothing relieves the symptoms. She has tried a cold compress (Ibuprofen) for the symptoms. The treatment provided no relief.    Past Medical History  Diagnosis Date  . Mitral valve prolapse     moderate by  echo 10/2009 with mild MR;  Echocardiogram 07/23/11: Normal LV wall thickness, EF 60%, normal wall motion, moderately severe bileaflet mitral valve prolapse with mild-moderate MR.  This was not felt to be significantly different from prior echo;  RHC 12/12: RA 2, RV 19/2, PA 18/6 with a mean of 12, PCWP 4, LV 103/57 with a mean of 76, AO 103/10;  CO 3.7, CI 1.9, O2 - PA 67%, Ao 97%  . Hypertension   . Diabetes mellitus     Previously diet-controlled  . Hypercholesteremia   . CAD (coronary artery disease)     Nonbstructive CAD by cath 09/2006 30% LAD, 25% RCA. Normal EF of 55-60% by echo 10/2009 with normal wall thickness.; ETT-Myoview 07/28/11: Normal without ischemia, EF 75%. ; LHC 12/12:   LM calcified, mod LAD calcification, mid LAD 30-40%, prox RCA 30-40%, EF 55-65%, 2+ MR   Past Surgical History  Procedure Laterality Date  . Appendectomy    . Knee surgery    . Abdominal hysterectomy    . Cholecystectomy    . US echocardiography      EF 55-60%   Family History  Problem Relation Age of Onset  . Hypertension Father   . Diabetes Mother    Social History  Substance Use Topics  . Smoking status: Current Some Day Smoker -- 0.00 packs/day for 20 years    Types: Cigarettes  . Smokeless tobacco: None  . Alcohol Use: Yes     Comment: 4   OB History    No data available  Review of Systems  Respiratory: Negative for shortness of breath.   Cardiovascular: Negative for chest pain.  Gastrointestinal: Negative for abdominal pain.  Neurological: Negative for headaches.    Allergies  Penicillins  Home Medications   Prior to Admission medications   Medication Sig Start Date End Date Taking? Authorizing Provider  simvastatin (ZOCOR) 10 MG tablet Take 10 mg by mouth daily.   Yes Historical Provider, MD  colchicine 0.6 MG tablet Take 1 tablet (0.6 mg total) by mouth daily as needed. For pain 10/02/15   Hassan Rowan, MD  ezetimibe-simvastatin (VYTORIN) 10-40 MG per tablet Take 1 tablet by  mouth at bedtime. Needs to be seen for additional refills. 01/12/13   Gaylord Shih, MD  hydrochlorothiazide (HYDRODIURIL) 25 MG tablet TAKE 1 TABLET BY MOUTH DAILY. 01/21/15   Lars Masson, MD  meloxicam (MOBIC) 15 MG tablet Take 1 tablet (15 mg total) by mouth daily as needed for pain. Do not take with Motrin or Aleve 10/02/15   Hassan Rowan, MD  metoprolol succinate (TOPROL-XL) 50 MG 24 hr tablet TAKE 1 TABLET (50 MG TOTAL) BY MOUTH DAILY. TAKE WITH OR IMMEDIATELY FOLLOWING A MEAL. 10/01/14   Beatrice Lecher, PA-C  varenicline (CHANTIX) 0.5 MG tablet Take 1 tablet (0.5 mg total) by mouth 2 (two) times daily. 01/12/13   Gaylord Shih, MD   Meds Ordered and Administered this Visit   Medications  colchicine tablet 1.2 mg (not administered)  ketorolac (TORADOL) injection 60 mg (60 mg Intramuscular Given 10/02/15 1038)  methylPREDNISolone sodium succinate (SOLU-MEDROL) 125 mg/2 mL injection 125 mg (125 mg Intramuscular Given 10/02/15 1029)    BP 148/54 mmHg  Pulse 71  Temp(Src) 97.7 F (36.5 C) (Tympanic)  Resp 16  Ht 5\' 7"  (1.702 m)  Wt 168 lb (76.204 kg)  BMI 26.31 kg/m2  SpO2 98% No data found.   Physical Exam  Constitutional: She is oriented to person, place, and time. She appears well-developed and well-nourished.  HENT:  Head: Normocephalic.  Eyes: Pupils are equal, round, and reactive to light.  Musculoskeletal: She exhibits tenderness.       Left foot: There is decreased range of motion, tenderness, bony tenderness and swelling.       Feet:  Over the left toe and the distal first metatarsal bone is redness swelling and tenderness. Decreased range of motion when toes move his well. No tenderness of the parts of the foot.  Neurological: She is alert and oriented to person, place, and time.  Skin: Skin is warm and dry.  Vitals reviewed.   ED Course  Procedures (including critical care time)  Labs Review Labs Reviewed  CBC WITH DIFFERENTIAL/PLATELET - Abnormal; Notable for the  following:    Platelets 148 (*)    All other components within normal limits  COMPREHENSIVE METABOLIC PANEL - Abnormal; Notable for the following:    Chloride 100 (*)    Glucose, Bld 139 (*)    All other components within normal limits  URIC ACID - Abnormal; Notable for the following:    Uric Acid, Serum 6.9 (*)    All other components within normal limits    Imaging Review Dg Foot Complete Left  10/02/2015  CLINICAL DATA:  Pain following fall 5 days prior EXAM: LEFT FOOT - COMPLETE 3+ VIEW COMPARISON:  None. FINDINGS: Frontal, oblique, and lateral views were obtained. There is soft tissue swelling of the first digit and first MTP joint regions. There is also mild swelling of the  distal foot dorsally. There is no demonstrable fracture or dislocation. Joint spaces appear intact. No erosive change. IMPRESSION: Areas of soft tissue swelling. No demonstrable fracture or dislocation. The joint spaces appear normal. Electronically Signed   By: Bretta Bang III M.D.   On: 10/02/2015 10:12     Visual Acuity Review  Right Eye Distance:   Left Eye Distance:   Bilateral Distance:    Right Eye Near:   Left Eye Near:    Bilateral Near:      Results for orders placed or performed during the hospital encounter of 10/02/15  CBC with Differential  Result Value Ref Range   WBC 6.6 3.6 - 11.0 K/uL   RBC 4.48 3.80 - 5.20 MIL/uL   Hemoglobin 14.4 12.0 - 16.0 g/dL   HCT 16.1 09.6 - 04.5 %   MCV 93.8 80.0 - 100.0 fL   MCH 32.1 26.0 - 34.0 pg   MCHC 34.2 32.0 - 36.0 g/dL   RDW 40.9 81.1 - 91.4 %   Platelets 148 (L) 150 - 440 K/uL   Neutrophils Relative % 53 %   Neutro Abs 3.5 1.4 - 6.5 K/uL   Lymphocytes Relative 34 %   Lymphs Abs 2.2 1.0 - 3.6 K/uL   Monocytes Relative 8 %   Monocytes Absolute 0.5 0.2 - 0.9 K/uL   Eosinophils Relative 4 %   Eosinophils Absolute 0.3 0 - 0.7 K/uL   Basophils Relative 1 %   Basophils Absolute 0.1 0 - 0.1 K/uL  Comprehensive metabolic panel  Result Value  Ref Range   Sodium 139 135 - 145 mmol/L   Potassium 3.6 3.5 - 5.1 mmol/L   Chloride 100 (L) 101 - 111 mmol/L   CO2 31 22 - 32 mmol/L   Glucose, Bld 139 (H) 65 - 99 mg/dL   BUN 15 6 - 20 mg/dL   Creatinine, Ser 7.82 0.44 - 1.00 mg/dL   Calcium 9.4 8.9 - 95.6 mg/dL   Total Protein 7.5 6.5 - 8.1 g/dL   Albumin 4.5 3.5 - 5.0 g/dL   AST 21 15 - 41 U/L   ALT 25 14 - 54 U/L   Alkaline Phosphatase 59 38 - 126 U/L   Total Bilirubin 0.8 0.3 - 1.2 mg/dL   GFR calc non Af Amer >60 >60 mL/min   GFR calc Af Amer >60 >60 mL/min   Anion gap 8 5 - 15  Uric acid  Result Value Ref Range   Uric Acid, Serum 6.9 (H) 2.3 - 6.6 mg/dL       MDM   1. Elevated uric acid in blood   2. Acute foot pain, left   3. Pain of left great toe     Explained to her that I'm concerned that x-rays negative for fracture of the toe or foot think she'll need reevaluation for gout. Since she did have the tumble off the steps on Sunday will x-ray her foot for assessment rule out for fracture but if fracture is not present I would strongly suggest we evaluate her for gout.  I have personally evaluated x-ray I see no signs of fracture and spite patient's consternation about having a diagnosis of gout she is agreed to be treated as if uric acid is elevated we will just state that she has elevated uric acid until her PCP does further test and makes a diagnosis of gout.  Radiologist screen is with me that there is no signs of fracture  Patient kidney function is  normal. 125 mg Solu-Medrol at 60 mg Toradol been given. Will give her 1.2 mg colchicine loading dose we given here with a prescription for 0.6 mg and 15 mg of Mobic to use on a daily basis for pain in the toe and foot. She should not take the Mobic with Motrin or Aleve and the diagnosis is elevated uric acid. Work note given for today and tomorrow.  Note: This dictation was prepared with Dragon dictation along with smaller phrase technology. Any transcriptional errors  that result from this process are unintentional.          Hassan Rowan, MD 10/02/15 1122

## 2016-01-18 ENCOUNTER — Other Ambulatory Visit: Payer: Self-pay | Admitting: Cardiology

## 2016-03-17 ENCOUNTER — Encounter: Payer: Self-pay | Admitting: Obstetrics and Gynecology

## 2016-03-17 ENCOUNTER — Ambulatory Visit (INDEPENDENT_AMBULATORY_CARE_PROVIDER_SITE_OTHER): Payer: Managed Care, Other (non HMO) | Admitting: Obstetrics and Gynecology

## 2016-03-17 VITALS — BP 96/5 | HR 60 | Ht 67.0 in | Wt 169.0 lb

## 2016-03-17 DIAGNOSIS — N393 Stress incontinence (female) (male): Secondary | ICD-10-CM | POA: Diagnosis not present

## 2016-03-17 DIAGNOSIS — N952 Postmenopausal atrophic vaginitis: Secondary | ICD-10-CM

## 2016-03-17 DIAGNOSIS — N3289 Other specified disorders of bladder: Secondary | ICD-10-CM | POA: Diagnosis not present

## 2016-03-17 DIAGNOSIS — N8110 Cystocele, unspecified: Secondary | ICD-10-CM | POA: Diagnosis not present

## 2016-03-17 DIAGNOSIS — R351 Nocturia: Secondary | ICD-10-CM | POA: Insufficient documentation

## 2016-03-17 DIAGNOSIS — IMO0002 Reserved for concepts with insufficient information to code with codable children: Secondary | ICD-10-CM | POA: Insufficient documentation

## 2016-03-17 DIAGNOSIS — R350 Frequency of micturition: Secondary | ICD-10-CM | POA: Diagnosis not present

## 2016-03-17 MED ORDER — TOLTERODINE TARTRATE ER 4 MG PO CP24: 4.0000 mg | ORAL_CAPSULE | Freq: Every evening | ORAL | Status: DC

## 2016-03-17 NOTE — Progress Notes (Signed)
GYN ENCOUNTER NOTE  Subjective:       Bonnie Ryan is a 60 y.o. G73P1001 female is here for gynecologic evaluation of the following issues:  1. Pelvic organ prolapse  Patient is status post TAH/BSO in 1989. She is not on estrogen replacement therapy. Patient has history of tobacco use until 8 months ago when she quit; she has history of 1 pack per week 15 years. Patient does not have history of asthma or reactive airway disease.  GU history: Urinary frequency: 10 times a day; appears to have incomplete bladder emptying. Nocturia: 6-7 times a day History of SUI without need to wear pads. History of urgency. History of pelvic pressure since that things appear to be falling out of vagina. No knowledge of structures prolapsing outside the vaginal introitus. No history of chronic UTIs  GI history:  Bowel movements every other day Patient reports splinting for bowel movements  Patient is status post SVD 1, 10 lbs. 4 oz.   Gynecologic History No LMP recorded. Patient has had a hysterectomy. Contraception: status post hysterectomy  Obstetric History OB History  Gravida Para Term Preterm AB Living  1 1 1     1   SAB TAB Ectopic Multiple Live Births          1    # Outcome Date GA Lbr Len/2nd Weight Sex Delivery Anes PTL Lv  1 Term 1987   10 lb 6.4 oz (4.717 kg) M Vag-Spont   LIV      Past Medical History:  Diagnosis Date  . CAD (coronary artery disease)    Nonbstructive CAD by cath 09/2006 30% LAD, 25% RCA. Normal EF of 55-60% by echo 10/2009 with normal wall thickness.; ETT-Myoview 07/28/11: Normal without ischemia, EF 75%. ; LHC 12/12:   LM calcified, mod LAD calcification, mid LAD 30-40%, prox RCA 30-40%, EF 55-65%, 2+ MR  . Diabetes mellitus    Previously diet-controlled  . Endometriosis   . Hypercholesteremia   . Hypertension   . Mitral valve prolapse    moderate by echo 10/2009 with mild MR;  Echocardiogram 07/23/11: Normal LV wall thickness, EF 60%, normal wall motion,  moderately severe bileaflet mitral valve prolapse with mild-moderate MR.  This was not felt to be significantly different from prior echo;  RHC 12/12: RA 2, RV 19/2, PA 18/6 with a mean of 12, PCWP 4, LV 103/57 with a mean of 76, AO 103/10;  CO 3.7, CI 1.9, O2 - PA 67%, Ao 97%    Past Surgical History:  Procedure Laterality Date  . ABDOMINAL HYSTERECTOMY     tah/bso  . APPENDECTOMY    . CHOLECYSTECTOMY    . KNEE SURGERY    . LAPAROSCOPY    . US ECHOCARDIOGRAPHY     EF 55-60%    Current Outpatient Prescriptions on File Prior to Visit  Medication Sig Dispense Refill  . metoprolol succinate (TOPROL-XL) 50 MG 24 hr tablet TAKE 1 TABLET (50 MG TOTAL) BY MOUTH DAILY. TAKE WITH OR IMMEDIATELY FOLLOWING A MEAL. 30 tablet 0  . simvastatin (ZOCOR) 10 MG tablet Take 10 mg by mouth daily.    . varenicline (CHANTIX) 0.5 MG tablet Take 1 tablet (0.5 mg total) by mouth 2 (two) times daily. 60 tablet 3   No current facility-administered medications on file prior to visit.     Allergies  Allergen Reactions  . Penicillins Other (See Comments)    Convulsions     Social History   Social History  . Marital  status: Divorced    Spouse name: N/A  . Number of children: N/A  . Years of education: N/A   Occupational History  . Not on file.   Social History Main Topics  . Smoking status: Former Smoker    Packs/day: 0.00    Years: 20.00    Types: Cigarettes    Quit date: 07/2015  . Smokeless tobacco: Not on file  . Alcohol use Yes     Comment: weekly  . Drug use: No  . Sexual activity: Not Currently    Birth control/ protection: Surgical   Other Topics Concern  . Not on file   Social History Narrative  . No narrative on file    Family History  Problem Relation Age of Onset  . Diabetes Mother   . Breast cancer Mother   . Hypertension Father   . Ovarian cancer Neg Hx   . Colon cancer Neg Hx   . Heart disease Neg Hx     The following portions of the patient's history were  reviewed and updated as appropriate: allergies, current medications, past family history, past medical history, past social history, past surgical history and problem list.  Review of Systems Review of Systems - Per history of present illness  Objective:   BP (!) 96/5   Pulse 60   Ht  (1.702 m)   Wt 169 lb (76.7 kg)   BMI 26.47 kg/m  CONSTITUTIONAL: Well-developed, well-nourished female in no acute distress.  HENT:  Normocephalic, atraumatic.  NECK: Not examined SKIN: Skin is warm and dry. No rash noted. Not diaphoretic. No erythema. No pallor. NEUROLGIC: Alert and oriented to person, place, and time. PSYCHIATRIC: Normal mood and affect. Normal behavior. Normal judgment and thought content. CARDIOVASCULAR:Not Examined RESPIRATORY: Not Examined BREASTS: Not Examined BACK: No CVA tenderness ABDOMEN: Soft, non distended; Non tender.  No Organomegaly. Pfannenstiel incision well-healed but scarred; no. PELVIC:  External Genitalia: Normal  BUS: Normal  Vagina: Moderate atrophy; first-degree cystocele; good support at the urethrovesical junction; mild rectocele; no enterocele  Cervix: Surgically absent  Uterus: Surgically absent  Adnexa: Surgically absent  RV: Normal external exam; normal sphincter tone; no rectal masses  Bladder: Nontender MUSCULOSKELETAL: Normal range of motion. No tenderness.  No cyanosis, clubbing, or edema.     Assessment:   1. Unstable bladder  2. Vaginal atrophy  3. Cystocele  4. Nocturia  5. Urinary frequency  6. Stress incontinence     Plan:   1. Estrogen cream intravaginal twice a week 2. Detrol LA 4 mg daily at bedtime 3. Kegel exercises 4. Physical therapy consultation-declined 5. Return in 3 months for follow-up  Herold Harms, MD  Note: This dictation was prepared with Dragon dictation along with smaller phrase technology. Any transcriptional errors that result from this process are unintentional.

## 2016-03-17 NOTE — Patient Instructions (Signed)
1. Begin estrogen cream intravaginal 2 times a week 2. Begin Detrol 4 mg at night 3. Return in 3 months for follow-up 4. Kegel exercises  Kegel Exercises The goal of Kegel exercises is to isolate and exercise your pelvic floor muscles. These muscles act as a hammock that supports the rectum, vagina, small intestine, and uterus. As the muscles weaken, the hammock sags and these organs are displaced from their normal positions. Kegel exercises can strengthen your pelvic floor muscles and help you to improve bladder and bowel control, improve sexual response, and help reduce many problems and some discomfort during pregnancy. Kegel exercises can be done anywhere and at any time. HOW TO PERFORM KEGEL EXERCISES 1. Locate your pelvic floor muscles. To do this, squeeze (contract) the muscles that you use when you try to stop the flow of urine. You will feel a tightness in the vaginal area (women) and a tight lift in the rectal area (men and women). 2. When you begin, contract your pelvic muscles tight for 2-5 seconds, then relax them for 2-5 seconds. This is one set. Do 4-5 sets with a short pause in between. 3. Contract your pelvic muscles for 8-10 seconds, then relax them for 8-10 seconds. Do 4-5 sets. If you cannot contract your pelvic muscles for 8-10 seconds, try 5-7 seconds and work your way up to 8-10 seconds. Your goal is 4-5 sets of 10 contractions each day. Keep your stomach, buttocks, and legs relaxed during the exercises. Perform sets of both short and long contractions. Vary your positions. Perform these contractions 3-4 times per day. Perform sets while you are:   Lying in bed in the morning.  Standing at lunch.  Sitting in the late afternoon.  Lying in bed at night. You should do 40-50 contractions per day. Do not perform more Kegel exercises per day than recommended. Overexercising can cause muscle fatigue. Continue these exercises for for at least 15-20 weeks or as directed by your  caregiver.   This information is not intended to replace advice given to you by your health care provider. Make sure you discuss any questions you have with your health care provider.   Document Released: 07/26/2012 Document Revised: 08/30/2014 Document Reviewed: 07/26/2012 Elsevier Interactive Patient Education [employer]

## 2016-03-18 MED ORDER — TOLTERODINE TARTRATE ER 4 MG PO CP24: 4.0000 mg | ORAL_CAPSULE | Freq: Every evening | ORAL | 6 refills | Status: DC

## 2016-03-18 NOTE — Addendum Note (Signed)
Addended by: Marchelle Folks on: 03/18/2016 07:58 AM   Modules accepted: Orders

## 2016-06-17 ENCOUNTER — Ambulatory Visit: Payer: Managed Care, Other (non HMO) | Admitting: Obstetrics and Gynecology

## 2016-10-13 ENCOUNTER — Telehealth: Payer: Self-pay

## 2016-10-13 NOTE — Telephone Encounter (Signed)
Sent notes to scheduling 

## 2016-10-18 NOTE — Progress Notes (Signed)
Cardiology Office Note   Date:  10/19/2016   ID:  Bonnie Ryan, DOB [birth year], MRN [id number]  PCP:  Emeterio ReeveWOLTERS,SHARON A, MD  Cardiologist:   Charlton HawsPeter Amandeep Nesmith, MD   Chief Complaint  Patient presents with  . Coronary Artery Disease  . Establish Care      History of Present Illness: Bonnie Ryan is a 61 y.o. female who presents for evaluation of MVP, HTN, elevated lipids and DM. Previously seen by Dr Susanne GreenhouseNahser Hochrein, Ladona Ridgelaylor and Excell Seltzerooper. Cath in 2008 with non obstructive disease  Described moderate MR.  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 Son is a Theatre stage managerfire fighter in town  She helps take care of 2 grand children   Past Medical History:  Diagnosis Date  . CAD (coronary artery disease)    Nonbstructive CAD by cath 09/2006 30% LAD, 25% RCA. Normal EF of 55-60% by echo 10/2009 with normal wall thickness.; ETT-Myoview 07/28/11: Normal without ischemia, EF 75%. ; LHC 12/12:   LM calcified, mod LAD calcification, mid LAD 30-40%, prox RCA 30-40%, EF 55-65%, 2+ MR  . Diabetes mellitus    Previously diet-controlled  . Endometriosis   . Hypercholesteremia   . Hypertension   . Mitral valve prolapse    moderate by echo 10/2009 with mild MR;  Echocardiogram 07/23/11: Normal LV wall thickness, EF 60%, normal wall motion, moderately severe bileaflet mitral valve prolapse with mild-moderate MR.  This was not felt to be significantly different from prior echo;  RHC 12/12: RA 2, RV 19/2, PA 18/6 with a mean of 12, PCWP 4, LV 103/57 with a mean of 76, AO 103/10;  CO 3.7, CI 1.9, O2 - PA 67%, Ao 97%    Past Surgical History:  Procedure Laterality Date  . ABDOMINAL HYSTERECTOMY     tah/bso  . APPENDECTOMY    . CHOLECYSTECTOMY    . KNEE SURGERY    . LAPAROSCOPY    . US ECHOCARDIOGRAPHY     EF 55-60%      Current Outpatient Prescriptions  Medication Sig Dispense Refill  . furosemide (LASIX) 20 MG tablet Take 20 mg by mouth.    . losartan (COZAAR) 50 MG tablet Take 50 mg by mouth daily.    . metFORMIN (GLUCOPHAGE) 500 MG tablet Take by mouth 2 (two) times daily with a meal.    . metoprolol succinate (TOPROL-XL) 50 MG 24 hr tablet TAKE 1 TABLET (50 MG TOTAL) BY MOUTH DAILY. TAKE WITH OR IMMEDIATELY FOLLOWING A MEAL. 30 tablet 0  . tolterodine (DETROL LA) 4 MG 24 hr capsule Take 1 capsule (4 mg total) by mouth Nightly. 30 capsule 6  . VICTOZA 18 MG/3ML SOPN Inject 0.6 mg into the skin daily.     No current facility-administered medications for this visit.     Allergies:   Penicillins    Social History:  The patient  reports that she has never smoked. She has never used smokeless tobacco. She reports that she drinks alcohol. She reports that she does not use drugs.   Family History:  The patient's family history includes Breast cancer in her mother; Diabetes in her mother; Hypertension in her father.    ROS:  Please see the history of present illness.   Otherwise, review of systems are positive  for none.   All other systems are reviewed and negative.    PHYSICAL EXAM: VS:  BP 124/80   Pulse 99   Ht 5\' 7"  (1.702 m)   Wt 172 lb 12.8 oz (78.4 kg)   SpO2 98%   BMI 27.06 kg/m  , BMI Body mass index is 27.06 kg/m. Affect appropriate Healthy:  appears stated age HEENT: normal Neck supple with no adenopathy JVP normal no bruits no thyromegaly Lungs clear with no wheezing and good diaphragmatic motion Heart:  S1/S2 MR murmur, no rub, gallop or click PMI normal Abdomen: benighn, BS positve, no tenderness, no AAA no bruit.  No HSM or HJR Distal pulses intact with no bruits No edema Neuro non-focal Skin warm and dry No muscular weakness    EKG:  2014 SR T wave inversions inferior and anterior lateral  10/19/16 SR rate 64 PAC inferior lateral T wave changes    Recent  Labs: No results found for requested labs within last 8760 hours.    Lipid Panel    Component Value Date/Time   CHOL 187 12/23/2006 0846   TRIG 111 12/23/2006 0846   HDL 53.9 12/23/2006 0846   CHOLHDL 3.5 CALC 12/23/2006 0846   VLDL 22 12/23/2006 0846   LDLCALC 111 (H) 12/23/2006 0846   LDLDIRECT 240.5 09/08/2006 1639      Wt Readings from Last 3 Encounters:  10/19/16 172 lb 12.8 oz (78.4 kg)  03/17/16 169 lb (76.7 kg)  10/02/15 168 lb (76.2 kg)      Other studies Reviewed: Additional studies/ records that were reviewed today include: Notes cardiology 2012 Cath 2008 myovue 2012 and echo 2012.  Notes [facility] Laurine Blazer 10/17/15.    ASSESSMENT AND PLAN:  1.  MVP/MR /fu ecoh to assess degree and EF  2. HTN: .Well controlled.  Continue current medications and low sodium Dash type diet.   3. DM: A1c 5.8 10/15/16 improved continue current low carb diet and meds  4. Cholesterol  LDL 133 labs reviewed from primary 10/15/16  Discussed utility of calcium score  5. Smoking quit over a year Chantix was helpful    Current medicines are reviewed at length with the patient today.  The patient does not have concerns regarding medicines.  The following changes have been made:  no change  Labs/ tests ordered today include: Echo   Orders Placed This Encounter  Procedures  . CT CARDIAC SCORING  . EKG 12-Lead  . ECHOCARDIOGRAM COMPLETE     Disposition:   FU with me in a year      Signed, Charlton Haws, MD  10/19/2016 4:23 PM    [facility] [address] [place], [place], [place]  [address] Phone: [phone]; Fax: [phone]

## 2016-10-19 ENCOUNTER — Ambulatory Visit (INDEPENDENT_AMBULATORY_CARE_PROVIDER_SITE_OTHER): Payer: Managed Care, Other (non HMO) | Admitting: Cardiovascular Disease

## 2016-10-19 ENCOUNTER — Encounter: Payer: Self-pay | Admitting: Cardiovascular Disease

## 2016-10-19 VITALS — BP 124/80 | HR 99 | Ht 67.0 in | Wt 172.8 lb

## 2016-10-19 DIAGNOSIS — I25111 Atherosclerotic heart disease of native coronary artery with angina pectoris with documented spasm: Secondary | ICD-10-CM

## 2016-10-19 DIAGNOSIS — Z7689 Persons encountering health services in other specified circumstances: Secondary | ICD-10-CM

## 2016-10-19 DIAGNOSIS — I340 Nonrheumatic mitral (valve) insufficiency: Secondary | ICD-10-CM

## 2016-10-19 NOTE — Patient Instructions (Addendum)
Medication Instructions:  Your physician recommends that you continue on your current medications as directed. Please refer to the Current Medication list given to you today.  Labwork: NONE  Testing/Procedures: Your physician has requested that you have an echocardiogram. Echocardiography is a painless test that uses sound waves to create images of your heart. It provides your doctor with information about the size and shape of your heart and how well your heart's chambers and valves are working. This procedure takes approximately one hour. There are no restrictions for this procedure.  Follow-Up: Your physician wants you to follow-up in: 12 months with Dr. Eden EmmsNishan. You will receive a reminder letter in the mail two months in advance. If you don't receive a letter, please call our office to schedule the follow-up appointment.   If you need a refill on your cardiac medications before your next appointment, please call your pharmacy.   Please call and let us know if you want to have a CT for your Calcium Score. Just give our office a call and ask for Pam I. [phone].

## 2016-11-04 ENCOUNTER — Ambulatory Visit (HOSPITAL_COMMUNITY): Payer: Managed Care, Other (non HMO) | Attending: Cardiovascular Disease

## 2016-11-04 ENCOUNTER — Other Ambulatory Visit: Payer: Self-pay

## 2016-11-04 ENCOUNTER — Inpatient Hospital Stay: Admission: RE | Admit: 2016-11-04 | Payer: Managed Care, Other (non HMO) | Source: Ambulatory Visit

## 2016-11-04 DIAGNOSIS — I10 Essential (primary) hypertension: Secondary | ICD-10-CM | POA: Diagnosis not present

## 2016-11-04 DIAGNOSIS — E119 Type 2 diabetes mellitus without complications: Secondary | ICD-10-CM | POA: Diagnosis not present

## 2016-11-04 DIAGNOSIS — I340 Nonrheumatic mitral (valve) insufficiency: Secondary | ICD-10-CM

## 2016-11-04 DIAGNOSIS — E7800 Pure hypercholesterolemia, unspecified: Secondary | ICD-10-CM | POA: Insufficient documentation

## 2016-11-04 DIAGNOSIS — I081 Rheumatic disorders of both mitral and tricuspid valves: Secondary | ICD-10-CM | POA: Insufficient documentation

## 2016-11-04 DIAGNOSIS — I2510 Atherosclerotic heart disease of native coronary artery without angina pectoris: Secondary | ICD-10-CM | POA: Diagnosis not present

## 2016-11-08 ENCOUNTER — Telehealth: Payer: Self-pay | Admitting: Cardiovascular Disease

## 2016-11-08 NOTE — Telephone Encounter (Signed)
Patient aware of test results and Dr. Fabio BeringNishan's recommendations.

## 2016-11-08 NOTE — Telephone Encounter (Signed)
New Message     [facility] about the next step after test results

## 2017-01-07 ENCOUNTER — Inpatient Hospital Stay: Admission: RE | Admit: 2017-01-07 | Payer: Managed Care, Other (non HMO) | Source: Ambulatory Visit

## 2018-03-01 ENCOUNTER — Other Ambulatory Visit: Payer: Self-pay | Admitting: Unknown Physician Specialty

## 2018-03-01 DIAGNOSIS — H9319 Tinnitus, unspecified ear: Secondary | ICD-10-CM

## 2018-03-08 ENCOUNTER — Other Ambulatory Visit: Payer: Self-pay | Admitting: Unknown Physician Specialty

## 2018-03-08 DIAGNOSIS — H9393 Unspecified disorder of ear, bilateral: Secondary | ICD-10-CM

## 2018-03-17 ENCOUNTER — Ambulatory Visit
Admission: RE | Admit: 2018-03-17 | Discharge: 2018-03-17 | Disposition: A | Discharge status: Home / Self Care | Payer: Managed Care, Other (non HMO) | Source: Ambulatory Visit | Attending: Unknown Physician Specialty | Admitting: Unknown Physician Specialty

## 2018-03-17 DIAGNOSIS — H9319 Tinnitus, unspecified ear: Secondary | ICD-10-CM | POA: Insufficient documentation

## 2018-03-17 DIAGNOSIS — I6523 Occlusion and stenosis of bilateral carotid arteries: Secondary | ICD-10-CM | POA: Insufficient documentation

## 2018-04-11 ENCOUNTER — Ambulatory Visit (INDEPENDENT_AMBULATORY_CARE_PROVIDER_SITE_OTHER): Payer: Managed Care, Other (non HMO) | Admitting: Vascular Surgery

## 2018-04-11 ENCOUNTER — Encounter (INDEPENDENT_AMBULATORY_CARE_PROVIDER_SITE_OTHER): Payer: Self-pay | Admitting: Vascular Surgery

## 2018-04-11 VITALS — BP 138/82 | HR 67 | Resp 16 | Ht 67.0 in | Wt 182.0 lb

## 2018-04-11 DIAGNOSIS — E118 Type 2 diabetes mellitus with unspecified complications: Secondary | ICD-10-CM

## 2018-04-11 DIAGNOSIS — I6529 Occlusion and stenosis of unspecified carotid artery: Secondary | ICD-10-CM | POA: Insufficient documentation

## 2018-04-11 DIAGNOSIS — E119 Type 2 diabetes mellitus without complications: Secondary | ICD-10-CM | POA: Insufficient documentation

## 2018-04-11 DIAGNOSIS — I10 Essential (primary) hypertension: Secondary | ICD-10-CM | POA: Diagnosis not present

## 2018-04-11 DIAGNOSIS — E785 Hyperlipidemia, unspecified: Secondary | ICD-10-CM | POA: Diagnosis not present

## 2018-04-11 DIAGNOSIS — I6522 Occlusion and stenosis of left carotid artery: Secondary | ICD-10-CM | POA: Diagnosis not present

## 2018-04-11 NOTE — Assessment & Plan Note (Signed)
blood glucose control important in reducing the progression of atherosclerotic disease. Also, involved in wound healing. On appropriate medications.  

## 2018-04-11 NOTE — Assessment & Plan Note (Signed)
blood pressure control important in reducing the progression of atherosclerotic disease. On appropriate oral medications.  

## 2018-04-11 NOTE — Assessment & Plan Note (Signed)
lipid control important in reducing the progression of atherosclerotic disease. Continue statin therapy  

## 2018-04-11 NOTE — Patient Instructions (Signed)
Carotid Artery Disease The carotid arteries are arteries on both sides of the neck. They carry blood to the brain. Carotid artery disease is when the arteries get smaller (narrow) or get blocked. If these arteries get smaller or get blocked, you are more likely to have a stroke or warning stroke (transient ischemic attack). Follow these instructions at home:  Take medicines as told by your doctor. Make sure you understand all your medicine instructions. Do not stop your medicines without talking to your doctor first.  Follow your doctor's diet instructions. It is important to eat a healthy diet that includes plenty of: ? Fresh fruits. ? Vegetables. ? Lean meats.  Avoid: ? High-fat foods. ? High-sodium foods. ? Foods that are fried, overly processed, or have poor nutritional value.  Stay a healthy weight.  Stay active. Get at least 30 minutes of activity every day.  Do not smoke.  Limit alcohol use to: ? No more than 2 drinks a day for men. ? No more than 1 drink a day for women who are not pregnant.  Do not use illegal drugs.  Keep all doctor visits as told. Get help right away if:  You have sudden weakness or loss of feeling (numbness) on one side of the body, such as the face, arm, or leg.  You have sudden confusion.  You have trouble speaking (aphasia) or understanding.  You have sudden trouble seeing out of one or both eyes.  You have sudden trouble walking.  You have dizziness or feel like you might pass out (faint).  You have a loss of balance or your movements are not steady (uncoordinated).  You have a sudden, severe headache with no known cause.  You have trouble swallowing (dysphagia). Call your local emergency services (911 in [place] Do notdrive yourself to the clinic or hospital. This information is not intended to replace advice given to you by your health care provider. Make sure you discuss any questions you have with your health care  provider. Document Released: 07/26/2012 Document Revised: 01/15/2016 Document Reviewed: 02/07/2013 Elsevier Interactive Patient Education  2018 [employer]  

## 2018-04-11 NOTE — Assessment & Plan Note (Signed)
She has undergone a carotid duplex which showed no significant right carotid artery stenosis and minimal, 1 to 49% left carotid artery stenosis.  She also underwent a MRA of the brain.  Although there was some moderate intracranial disease in the periphery, there were really no worrisome findings on this study either.  I have independently reviewed both studies. At this level, no intervention would be of benefit.  We discussed the reason and rationale that intracranial disease is generally not treated medically or surgically.  Aspirin and a statin agent would be more than adequate treatment at this point.  I do not think her vascular issues are the cause of her symptoms at current.  Hopefully the neurologist will be able to elucidate that a little better.  I will recheck her in 1 year.

## 2018-04-11 NOTE — Progress Notes (Signed)
Patient ID: Bonnie LotCynthia Bohlken, female   DOB: 05-01-1956, 62 y.o.   MRN: [id number]  Chief Complaint  Patient presents with  . New Patient (Initial Visit)    ref Jenne CampusMcQueen for CAS    HPI Bonnie Ryan is a 62 y.o. female.  I am asked to see the patient by Dr. Jenne CampusMcQueen for evaluation of carotid artery stenosis.  The patient reports issues with arms and legs going to sleep, lack of energy, and some memory issues.  She has had weight gain from decreasing activity.  She had a severe ear infection about 8 months ago that resulted in ruptured eardrums and has had issues with tinnitus since that time.  The pulsatile tinnitus she describes as terrible and just bothers her all the time.  She is not sleeping well.  She has an appointment to see a neurologist next month. She has undergone a carotid duplex which showed no significant right carotid artery stenosis and minimal, 1 to 49% left carotid artery stenosis.  She also underwent a MRA of the brain.  Although there was some moderate intracranial disease in the periphery, there were really no worrisome findings on this study either.  I have independently reviewed both studies.   Past Medical History:  Diagnosis Date  . CAD (coronary artery disease)    Nonbstructive CAD by cath 09/2006 30% LAD, 25% RCA. Normal EF of 55-60% by echo 10/2009 with normal wall thickness.; ETT-Myoview 07/28/11: Normal without ischemia, EF 75%. ; LHC 12/12:   LM calcified, mod LAD calcification, mid LAD 30-40%, prox RCA 30-40%, EF 55-65%, 2+ MR  . Diabetes mellitus    Previously diet-controlled  . Endometriosis   . Hypercholesteremia   . Hypertension   . Mitral valve prolapse    moderate by echo 10/2009 with mild MR;  Echocardiogram 07/23/11: Normal LV wall thickness, EF 60%, normal wall motion, moderately severe bileaflet mitral valve prolapse with mild-moderate MR.  This was not felt to be significantly different from prior echo;  RHC 12/12: RA 2, RV 19/2, PA 18/6 with a mean  of 12, PCWP 4, LV 103/57 with a mean of 76, AO 103/10;  CO 3.7, CI 1.9, O2 - PA 67%, Ao 97%    Past Surgical History:  Procedure Laterality Date  . ABDOMINAL HYSTERECTOMY     tah/bso  . APPENDECTOMY    . CHOLECYSTECTOMY    . KNEE SURGERY    . LAPAROSCOPY    . US ECHOCARDIOGRAPHY     EF 55-60%    Family History  Problem Relation Age of Onset  . Diabetes Mother   . Breast cancer Mother   . Hypertension Father   . Ovarian cancer Neg Hx   . Colon cancer Neg Hx   . Heart disease Neg Hx     Social History Social History   Tobacco Use  . Smoking status: Never Smoker  . Smokeless tobacco: Never Used  Substance Use Topics  . Alcohol use: Yes    Comment: weekly  . Drug use: No    Allergies  Allergen Reactions  . Penicillins Other (See Comments)    Convulsions     Current Outpatient Medications  Medication Sig Dispense Refill  . furosemide (LASIX) 20 MG tablet Take 20 mg by mouth.    . losartan (COZAAR) 50 MG tablet Take 50 mg by mouth daily.    . metFORMIN (GLUCOPHAGE) 500 MG tablet Take by mouth 2 (two) times daily with a meal.    . metoprolol  succinate (TOPROL-XL) 50 MG 24 hr tablet TAKE 1 TABLET (50 MG TOTAL) BY MOUTH DAILY. TAKE WITH OR IMMEDIATELY FOLLOWING A MEAL. 30 tablet 0  . rosuvastatin (CRESTOR) 20 MG tablet Take 20 mg by mouth daily.  3  . VICTOZA 18 MG/3ML SOPN Inject 1.2 mg into the skin daily.     Marland Kitchen tolterodine (DETROL LA) 4 MG 24 hr capsule Take 1 capsule (4 mg total) by mouth Nightly. (Patient not taking: Reported on 04/11/2018) 30 capsule 6   No current facility-administered medications for this visit.       REVIEW OF SYSTEMS (Negative unless checked)  Constitutional: [] Weight loss  [] Fever  [] Chills Cardiac: [] Chest pain   [] Chest pressure   [] Palpitations   [] Shortness of breath when laying flat   [] Shortness of breath at rest   [] Shortness of breath with exertion. Vascular:  [] Pain in legs with walking   [] Pain in legs at rest   [] Pain in legs  when laying flat   [] Claudication   [] Pain in feet when walking  [] Pain in feet at rest  [] Pain in feet when laying flat   [] History of DVT   [] Phlebitis   [] Swelling in legs   [] Varicose veins   [] Non-healing ulcers Pulmonary:   [] Uses home oxygen   [] Productive cough   [] Hemoptysis   [] Wheeze  [] COPD   [] Asthma Neurologic:  [] Dizziness  [] Blackouts   [] Seizures   [] History of stroke   [] History of TIA  [] Aphasia   [] Temporary blindness   [] Dysphagia   [x] Weakness or numbness in arms   [x] Weakness or numbness in legs Musculoskeletal:  [x] Arthritis   [] Joint swelling   [] Joint pain   [] Low back pain Hematologic:  [] Easy bruising  [] Easy bleeding   [] Hypercoagulable state   [] Anemic  [] Hepatitis Gastrointestinal:  [] Blood in stool   [] Vomiting blood  [] Gastroesophageal reflux/heartburn   [] Abdominal pain Genitourinary:  [] Chronic kidney disease   [] Difficult urination  [] Frequent urination  [] Burning with urination   [] Hematuria Skin:  [] Rashes   [] Ulcers   [] Wounds Psychological:  [] History of anxiety   []  History of major depression.    Physical Exam BP 138/82 (BP Location: Right Arm)   Pulse 67   Resp 16   Ht 5\' 7"  (1.702 m)   Wt 182 lb (82.6 kg)   BMI 28.51 kg/m  Gen:  WD/WN, NAD.  Appears younger than stated age Head: [place]/AT, No temporalis wasting.  Ear/Nose/Throat: Hearing grossly intact, nares w/o erythema or drainage, oropharynx w/o Erythema/Exudate Eyes: Conjunctiva clear, sclera non-icteric  Neck: trachea midline.  No JVD.  Pulmonary:  Good air movement, respirations not labored, no use of accessory muscles Cardiac: RRR, normal S1, S2 Vascular:  Vessel Right Left  Radial Palpable Palpable                                   Musculoskeletal: M/S 5/5 throughout.  Extremities without ischemic changes.  No deformity or atrophy.  No edema. Neurologic: Sensation grossly intact in extremities.  Symmetrical.  Speech is fluent. Motor exam as listed above. Psychiatric: Judgment  intact, Mood & affect appropriate for pt's clinical situation. Dermatologic: No rashes or ulcers noted.  No cellulitis or open wounds.   Radiology Mr Maxine Glenn Head Wo Contrast  Result Date: 03/17/2018 CLINICAL DATA:  History of ear infection. Swishing sound in both ears. EXAM: MRA HEAD WITHOUT CONTRAST TECHNIQUE: Angiographic images of the Circle of Willis were  obtained using MRA technique without intravenous contrast. COMPARISON:  Duplex carotid sonography reported earlier today. FINDINGS: The internal carotid arteries are widely patent. The basilar artery is widely patent with both vertebrals contributing, RIGHT dominant. Hypoplasia of the LEFT A1 ACA, normal variant. No M1 stenosis of significance. Focal narrowing of the superior division RIGHT M2 MCA, estimated 75%. Fetal origin LEFT PCA. No PCA stenosis. No cerebellar branch occlusion. No saccular aneurysm. No evidence for abnormal intracranial vessel which might contribute to symptoms of BILATERAL pulsatile tinnitus. IMPRESSION: MRA of the intracranial circulation demonstrates no finding which could be contributory to pulsatile tinnitus. Intracranial atherosclerotic disease affecting the RIGHT M2 MCA. Hypoplasia LEFT A1 ACA. Fetal origin LEFT PCA. Electronically Signed   By: Elsie StainJohn T Curnes M.D.   On: 03/17/2018 16:19   [place] Carotid Bilateral  Result Date: 03/17/2018 CLINICAL DATA:  62 year old female with tinnitus EXAM: BILATERAL CAROTID DUPLEX ULTRASOUND TECHNIQUE: Wallace CullensGray scale imaging, color Doppler and duplex ultrasound were performed of bilateral carotid and vertebral arteries in the neck. COMPARISON:  None. FINDINGS: Criteria: Quantification of carotid stenosis is based on velocity parameters that correlate the residual internal carotid diameter with NASCET-based stenosis levels, using the diameter of the distal internal carotid lumen as the denominator for stenosis measurement. The following velocity measurements were obtained: RIGHT ICA: 37/15 cm/sec  CCA: 78/13 cm/sec SYSTOLIC ICA/CCA RATIO:  0.5 ECA:  85 cm/sec LEFT ICA: 61/17 cm/sec CCA: 64/16 cm/sec SYSTOLIC ICA/CCA RATIO:  0.9 ECA:  68 cm/sec RIGHT CAROTID ARTERY: No significant atherosclerotic plaque or evidence of stenosis. RIGHT VERTEBRAL ARTERY:  Patent with normal antegrade flow. LEFT CAROTID ARTERY: Mild atherosclerotic plaque in the common and proximal internal carotid artery. By peak systolic velocity criteria, the estimated stenosis remains less than 50%. LEFT VERTEBRAL ARTERY:  Patent with normal antegrade flow. IMPRESSION: 1. Mild (1-49%) stenosis proximal left internal carotid artery secondary to heterogenous atherosclerotic plaque. 2. No significant atherosclerotic plaque or evidence of stenosis in the right internal carotid artery. 3. Vertebral arteries are patent with normal antegrade flow. Signed, Sterling BigHeath K. McCullough, MD [facility] Electronically Signed   By: Malachy MoanHeath  McCullough M.D.   On: 03/17/2018 15:53    Labs No results found for this or any previous visit (from the past 2160 hour(s)).  Assessment/Plan:  Diabetes (HCC) blood glucose control important in reducing the progression of atherosclerotic disease. Also, involved in wound healing. On appropriate medications.   HTN (hypertension) blood pressure control important in reducing the progression of atherosclerotic disease. On appropriate oral medications.   HLD (hyperlipidemia) lipid control important in reducing the progression of atherosclerotic disease. Continue statin therapy   Carotid stenosis She has undergone a carotid duplex which showed no significant right carotid artery stenosis and minimal, 1 to 49% left carotid artery stenosis.  She also underwent a MRA of the brain.  Although there was some moderate intracranial disease in the periphery, there were really no worrisome findings on this study either.  I have independently reviewed both studies. At  this level, no intervention would be of benefit.  We discussed the reason and rationale that intracranial disease is generally not treated medically or surgically.  Aspirin and a statin agent would be more than adequate treatment at this point.  I do not think her vascular issues are the cause of her symptoms at current.  Hopefully the neurologist will be able to elucidate that a little better.  I will recheck her in 1 year.  Festus Barren 04/11/2018, 3:08 PM   This note was created with Dragon medical transcription system.  Any errors from dictation are unintentional.

## 2018-09-01 ENCOUNTER — Encounter: Payer: Self-pay | Admitting: Family Medicine

## 2018-09-01 ENCOUNTER — Ambulatory Visit (INDEPENDENT_AMBULATORY_CARE_PROVIDER_SITE_OTHER): Payer: Managed Care, Other (non HMO) | Admitting: Family Medicine

## 2018-09-01 VITALS — BP 110/62 | HR 54 | Temp 98.3°F | Resp 18 | Ht 65.0 in | Wt 190.6 lb

## 2018-09-01 DIAGNOSIS — I25111 Atherosclerotic heart disease of native coronary artery with angina pectoris with documented spasm: Secondary | ICD-10-CM

## 2018-09-01 DIAGNOSIS — R609 Edema, unspecified: Secondary | ICD-10-CM | POA: Diagnosis not present

## 2018-09-01 DIAGNOSIS — E1169 Type 2 diabetes mellitus with other specified complication: Secondary | ICD-10-CM

## 2018-09-01 DIAGNOSIS — I10 Essential (primary) hypertension: Secondary | ICD-10-CM | POA: Diagnosis not present

## 2018-09-01 DIAGNOSIS — G4700 Insomnia, unspecified: Secondary | ICD-10-CM

## 2018-09-01 DIAGNOSIS — Z23 Encounter for immunization: Secondary | ICD-10-CM | POA: Diagnosis not present

## 2018-09-01 DIAGNOSIS — Z1231 Encounter for screening mammogram for malignant neoplasm of breast: Secondary | ICD-10-CM

## 2018-09-01 DIAGNOSIS — E785 Hyperlipidemia, unspecified: Secondary | ICD-10-CM

## 2018-09-01 MED ORDER — ZOLPIDEM TARTRATE 5 MG PO TABS: 5.0000 mg | ORAL_TABLET | Freq: Every evening | ORAL | 1 refills | Status: DC | PRN

## 2018-09-01 NOTE — Progress Notes (Signed)
Subjective:    Patient ID: Bonnie Ryan, female    DOB: 11-03-1955, 63 y.o.   MRN: [id number]  HPI   Patient presents to clinic to establish with PCP.  Patient has chronic CAD, HTN, PVD related to heart disease & follows regularly with cardiology.  Currently blood pressure is controlled with losartan and metoprolol.  She also does take furosemide daily to help keep lower extremity swelling under control.  Denies any chest pain, palpitations, lower extremity swelling at this time.  She also takes crestor to control cholesterol - tolerating this medication without any issues.   Patient uses Victoza to control type 2 diabetes as well as metformin with good success.  Patient is unsure of her last mammogram, also would like flu vaccine today.  Patient has had a hysterectomy, so she does not require Pap smear.  Patient had a colonoscopy in December 2018.  Patient also complains of insomnia for many months.  She has tried many over-the-counter sleep aids including Benadryl and melatonin without any success.  Past Medical History:  Diagnosis Date  . CAD (coronary artery disease)    Nonbstructive CAD by cath 09/2006 30% LAD, 25% RCA. Normal EF of 55-60% by echo 10/2009 with normal wall thickness.; ETT-Myoview 07/28/11: Normal without ischemia, EF 75%. ; LHC 12/12:   LM calcified, mod LAD calcification, mid LAD 30-40%, prox RCA 30-40%, EF 55-65%, 2+ MR  . Diabetes mellitus    Previously diet-controlled  . Endometriosis   . Hypercholesteremia   . Hypertension   . Mitral valve prolapse    moderate by echo 10/2009 with mild MR;  Echocardiogram 07/23/11: Normal LV wall thickness, EF 60%, normal wall motion, moderately severe bileaflet mitral valve prolapse with mild-moderate MR.  This was not felt to be significantly different from prior echo;  RHC 12/12: RA 2, RV 19/2, PA 18/6 with a mean of 12, PCWP 4, LV 103/57 with a mean of 76, AO 103/10;  CO 3.7, CI 1.9, O2 - PA 67%, Ao 97%   Social  History   Tobacco Use  . Smoking status: Never Smoker  . Smokeless tobacco: Never Used  Substance Use Topics  . Alcohol use: Yes    Comment: weekly   Past Surgical History:  Procedure Laterality Date  . ABDOMINAL HYSTERECTOMY     tah/bso  . APPENDECTOMY    . CHOLECYSTECTOMY    . KNEE SURGERY    . LAPAROSCOPY    . US ECHOCARDIOGRAPHY     EF 55-60%   Family History  Problem Relation Age of Onset  . Diabetes Mother   . Breast cancer Mother   . Hypertension Father   . Ovarian cancer Neg Hx   . Colon cancer Neg Hx   . Heart disease Neg Hx    Review of Systems  Constitutional: Negative for chills, fatigue and fever.  HENT: Negative for congestion, ear pain, sinus pain and sore throat.   Eyes: Negative.   Respiratory: Negative for cough, shortness of breath and wheezing.   Cardiovascular: Negative for chest pain, palpitations and leg swelling.  Gastrointestinal: Negative for abdominal pain, diarrhea, nausea and vomiting.  Genitourinary: Negative for dysuria, frequency and urgency.  Musculoskeletal: Negative for arthralgias and myalgias.  Skin: Negative for color change, pallor and rash.  Neurological: Negative for syncope, light-headedness and headaches.  Psychiatric/Behavioral: The patient is not nervous/anxious.       Objective:   Physical Exam  Constitutional: She appears well-developed and well-nourished. No distress.  HENT:  Head:  Normocephalic and atraumatic.  Eyes: Pupils are equal, round, and reactive to light. EOM are normal. No scleral icterus.  Neck: Normal range of motion. Neck supple. No tracheal deviation present.  Cardiovascular: Normal rate, regular rhythm and normal heart sounds. No LE swelling. Pulmonary/Chest: Effort normal and breath sounds normal. No respiratory distress. She has no wheezes. She has no rales.  Abdominal: Soft. Bowel sounds are normal. There is no tenderness.  Neurological: She is alert and oriented to person, place, and time.    Gait normal  Skin: Skin is warm and dry. No pallor.  Psychiatric: She has a normal mood and affect. Her behavior is normal. Thought content normal.   Nursing note and vitals reviewed.   Vitals:   09/01/18 1430  BP: 110/62  Pulse: (!) 54  Resp: 18  Temp: 98.3 F (36.8 C)  SpO2: 96%      Assessment & Plan:   Essential hypertension, coronary artery disease, edema, hyperlipidemia- patient is stable on current medications.  She follows regularly with cardiology.  Type 2 diabetes- blood sugars well controlled on Victoza and metformin.  We will get new A1c in clinic today.  Screening mammogram ordered as patient cannot remember the last time she had one.  Flu vaccine given in clinic today.  Insomnia-patient has not had success with over-the-counter melatonin or Benadryl, so we will trial low-dose Ambien 5 mg/day to see if this has any effect  Lab work drawn in clinic today including CBC, CMP, lipid panel, thyroid panel, vitamin D, B12/folate panel  We will have patient follow-up in approximately 3 months for recheck on chronic conditions, patient may need to return to clinic sooner based on lab results.

## 2018-09-04 LAB — CBC
HCT: 35.9 % (ref 35.0–45.0)
Hemoglobin: 12.2 g/dL (ref 11.7–15.5)
MCH: 33 pg (ref 27.0–33.0)
MCHC: 34 g/dL (ref 32.0–36.0)
MCV: 97 fL (ref 80.0–100.0)
MPV: 10.6 fL (ref 7.5–12.5)
PLATELETS: 175 10*3/uL (ref 140–400)
RBC: 3.7 10*6/uL — AB (ref 3.80–5.10)
RDW: 14.4 % (ref 11.0–15.0)
WBC: 6.1 10*3/uL (ref 3.8–10.8)

## 2018-09-04 LAB — THYROID PANEL WITH TSH
FREE THYROXINE INDEX: 2.3 (ref 1.4–3.8)
T3 Uptake: 28 % (ref 22–35)
T4 TOTAL: 8.1 ug/dL (ref 5.1–11.9)
TSH: 0.98 mIU/L (ref 0.40–4.50)

## 2018-09-04 LAB — COMPREHENSIVE METABOLIC PANEL
AG Ratio: 1.9 (calc) (ref 1.0–2.5)
ALBUMIN MSPROF: 4.5 g/dL (ref 3.6–5.1)
ALKALINE PHOSPHATASE (APISO): 60 U/L (ref 33–130)
ALT: 33 U/L — ABNORMAL HIGH (ref 6–29)
AST: 37 U/L — ABNORMAL HIGH (ref 10–35)
BUN/Creatinine Ratio: 31 (calc) — ABNORMAL HIGH (ref 6–22)
BUN: 29 mg/dL — AB (ref 7–25)
CHLORIDE: 100 mmol/L (ref 98–110)
CO2: 25 mmol/L (ref 20–32)
CREATININE: 0.94 mg/dL (ref 0.50–0.99)
Calcium: 10.1 mg/dL (ref 8.6–10.4)
GLOBULIN: 2.4 g/dL (ref 1.9–3.7)
GLUCOSE: 186 mg/dL — AB (ref 65–99)
POTASSIUM: 3.8 mmol/L (ref 3.5–5.3)
SODIUM: 140 mmol/L (ref 135–146)
TOTAL PROTEIN: 6.9 g/dL (ref 6.1–8.1)
Total Bilirubin: 0.4 mg/dL (ref 0.2–1.2)

## 2018-09-04 LAB — B12 AND FOLATE PANEL: Vitamin B-12: 545 pg/mL (ref 200–1100)

## 2018-09-04 LAB — HEMOGLOBIN A1C
Hgb A1c MFr Bld: 7 % of total Hgb — ABNORMAL HIGH (ref <5.7)
Mean Plasma Glucose: 154 (calc)
eAG (mmol/L): 8.5 (calc)

## 2018-09-04 LAB — VITAMIN D 25 HYDROXY (VIT D DEFICIENCY, FRACTURES): Vit D, 25-Hydroxy: 24 ng/mL — ABNORMAL LOW (ref 30–100)

## 2018-09-04 LAB — LIPID PANEL
CHOLESTEROL: 265 mg/dL — AB (ref <200)
HDL: 54 mg/dL (ref >50)
NON-HDL CHOLESTEROL (CALC): 211 mg/dL — AB (ref <130)
Total CHOL/HDL Ratio: 4.9 (calc) (ref <5.0)
Triglycerides: 664 mg/dL — ABNORMAL HIGH (ref <150)

## 2018-09-05 ENCOUNTER — Other Ambulatory Visit: Payer: Self-pay | Admitting: Family Medicine

## 2018-09-05 DIAGNOSIS — Z1231 Encounter for screening mammogram for malignant neoplasm of breast: Secondary | ICD-10-CM

## 2018-09-06 ENCOUNTER — Encounter: Payer: Self-pay | Admitting: Family Medicine

## 2018-09-11 ENCOUNTER — Other Ambulatory Visit: Payer: Self-pay | Admitting: Family Medicine

## 2018-09-11 DIAGNOSIS — E1169 Type 2 diabetes mellitus with other specified complication: Secondary | ICD-10-CM

## 2018-09-11 MED ORDER — VICTOZA 18 MG/3ML ~~LOC~~ SOPN: 1.8000 mg | PEN_INJECTOR | Freq: Every day | SUBCUTANEOUS | 3 refills | Status: DC

## 2018-09-12 ENCOUNTER — Other Ambulatory Visit: Payer: Self-pay | Admitting: Family Medicine

## 2018-09-12 DIAGNOSIS — G4700 Insomnia, unspecified: Secondary | ICD-10-CM

## 2018-09-12 MED ORDER — ZOLPIDEM TARTRATE ER 6.25 MG PO TBCR: 6.2500 mg | EXTENDED_RELEASE_TABLET | Freq: Every evening | ORAL | 0 refills | Status: DC | PRN

## 2018-09-19 ENCOUNTER — Ambulatory Visit
Admission: RE | Admit: 2018-09-19 | Discharge: 2018-09-19 | Disposition: A | Discharge status: Home / Self Care | Payer: Managed Care, Other (non HMO) | Source: Ambulatory Visit | Attending: Family Medicine | Admitting: Family Medicine

## 2018-09-19 DIAGNOSIS — Z1231 Encounter for screening mammogram for malignant neoplasm of breast: Secondary | ICD-10-CM | POA: Insufficient documentation

## 2018-10-06 ENCOUNTER — Ambulatory Visit: Payer: Managed Care, Other (non HMO) | Admitting: Physician Assistant

## 2018-11-05 NOTE — Progress Notes (Deleted)
{Choose 1 Note Type (Telehealth Visit or Telephone Visit):[phone]}  Evaluation Performed:  Follow-up visit  This visit type was conducted due to national recommendations for restrictions regarding the COVID-19 Pandemic (e.g. social distancing).  This format is felt to be most appropriate for this patient at this time.  All issues noted in this document were discussed and addressed.  No physical exam was performed (except for noted visual exam findings with Video Visits).  Please refer to the patient's chart (MyChart message for video visits and phone note for telephone visits) for the patient's consent to telehealth for [facility].  Date:  11/16/2018   ID:  Bonnie Ryan, DOB 03-15-1956, MRN [id number]  Patient Location:  [place] [address]   Provider location:   [facility] [address] [place], [place] [address]  PCP:  Jodelle Green, FNP  Cardiologist:  Jenkins Rouge, MD Electrophysiologist:  None   Chief Complaint:  ***  History of Present Illness:    Bonnie Ryan is a 63 y.o. female who presents via audio/video conferencing for a telehealth visit today.  She has a history of mitral valve prolapse with mild MR by echo in 10/2016, non-obstructive CAD by cath in 2008, hypertension, hyperlipidemia, and type 2 diabetes mellitus.  She was last seen by Dr. Johnsie Cancel in 09/2016.  The patient {does/does not:200015} symptoms concerning for COVID-19 infection (fever, chills, cough, or new SHORTNESS OF BREATH).    Prior CV studies:   The following studies were reviewed today:  TTE (11/04/2016): Mildly dilated LV with LVEF 60-65%.  Normal wall motion with grade 2 diastolic dysfunction.  MAC with mildly thickened/calcified mitral valve.  Prolapse of both leaflets noted with mild late-systolic regurgitation.  Severe left atrial enlargement.  Normal RV size and function.  Normal RVSP.  Past Medical  History:  Diagnosis Date  . CAD (coronary artery disease)    Nonbstructive CAD by cath 09/2006 30% LAD, 25% RCA. Normal EF of 55-60% by echo 10/2009 with normal wall thickness.; ETT-Myoview 07/28/11: Normal without ischemia, EF 75%. ; LHC 12/12:   LM calcified, mod LAD calcification, mid LAD 30-40%, prox RCA 30-40%, EF 55-65%, 2+ MR  . Diabetes mellitus    Previously diet-controlled  . Endometriosis   . Hypercholesteremia   . Hypertension   . Mitral valve prolapse    moderate by echo 10/2009 with mild MR;  Echocardiogram 07/23/11: Normal LV wall thickness, EF 60%, normal wall motion, moderately severe bileaflet mitral valve prolapse with mild-moderate MR.  This was not felt to be significantly different from prior echo;  [place] 12/12: RA 2, RV 19/2, PA 18/6 with a mean of 12, PCWP 4, LV 103/57 with a mean of 76, AO 103/10;  CO 3.7, CI 1.9, O2 - PA 67%, Ao 97%  . UTI (urinary tract infection)    Past Surgical History:  Procedure Laterality Date  . ABDOMINAL HYSTERECTOMY     tah/bso  . APPENDECTOMY    . CHOLECYSTECTOMY    . KNEE SURGERY    . LAPAROSCOPY    . US ECHOCARDIOGRAPHY     EF 55-60%     No outpatient medications have been marked as taking for the 11/17/18 encounter (Appointment) with Issac Moure, Harrell Gave, MD.     Allergies:   Penicillins   Social History   Tobacco Use  . Smoking status: Never Smoker  . Smokeless tobacco: Never Used  Substance Use Topics  . Alcohol use:  Yes    Comment: weekly  . Drug use: No     Family Hx: The patient's family history includes Breast cancer (age of onset: 75) in her mother; Diabetes in her mother; Hypertension in her father. There is no history of Ovarian cancer, Colon cancer, or Heart disease.  ROS:   Please see the history of present illness.    *** All other systems reviewed and are negative.   Labs/Other Tests and Data Reviewed:    Recent Labs: 09/01/2018: ALT 33; BUN 29; Creat 0.94; Hemoglobin 12.2; Platelets 175; Potassium 3.8;  Sodium 140; TSH 0.98   Recent Lipid Panel Lab Results  Component Value Date/Time   CHOL 265 (H) 09/01/2018 02:53 PM   TRIG 664 (H) 09/01/2018 02:53 PM   HDL 54 09/01/2018 02:53 PM   CHOLHDL 4.9 09/01/2018 02:53 PM   [place]  09/01/2018 02:53 PM     Comment:     . LDL cholesterol not calculated. Triglyceride levels greater than 400 mg/dL invalidate calculated LDL results. . Reference range: <100 . Desirable range <100 mg/dL for primary prevention;   <70 mg/dL for patients with CHD or diabetic patients  with > or = 2 CHD risk factors. Marland Kitchen LDL-C is now calculated using the Martin-Hopkins  calculation, which is a validated novel method providing  better accuracy than the Friedewald equation in the  estimation of LDL-C.  Cresenciano Genre et al. Annamaria Helling. 5366;440(34): 2061-2068  (http://education.[facility].com/faq/FAQ164)    LDLDIRECT 240.5 09/08/2006 04:39 PM    Wt Readings from Last 3 Encounters:  09/01/18 190 lb 9.6 oz (86.5 kg)  04/11/18 182 lb (82.6 kg)  10/19/16 172 lb 12.8 oz (78.4 kg)     Exam:    Vital Signs:  There were no vitals taken for this visit.   Well nourished, well developed female in no*** acute distress. ***  ASSESSMENT & PLAN:    1.  ***  COVID-19 Education: The signs and symptoms of COVID-19 were discussed with the patient and how to seek care for testing (follow up with PCP or arrange E-visit).  ***The importance of social distancing was discussed today.  Patient Risk:   After full review of this patients clinical status, I feel that they are at least moderate risk at this time.  Time:   Today, I have spent *** minutes with the patient with telehealth technology discussing ***.     Medication Adjustments/Labs and Tests Ordered: Current medicines are reviewed at length with the patient today.  Concerns regarding medicines are outlined above.  Tests Ordered: No orders of the defined types were placed in this encounter.  Medication Changes: No  orders of the defined types were placed in this encounter.   Disposition:  {follow up:15908}  Signed, Nelva Bush, MD  11/16/2018 8:00 PM    [place]

## 2018-11-16 ENCOUNTER — Telehealth: Payer: Self-pay

## 2018-11-16 NOTE — Progress Notes (Signed)
Virtual Visit via Telephone Note    Evaluation Performed:  Follow-up visit  This visit type was conducted due to national recommendations for restrictions regarding the COVID-19 Pandemic (e.g. social distancing).  This format is felt to be most appropriate for this patient at this time.  All issues noted in this document were discussed and addressed.  No physical exam was performed (except for noted visual exam findings with Video Visits).  Please refer to the patient's chart (MyChart message for video visits and phone note for telephone visits) for the patient's consent to telehealth for [facility].  Date:  11/18/2018   ID:  Bonnie Ryan, DOB [birth year], MRN [id number]  Patient Location:  [place] [address]   Provider location:   [facility] [address] [place], [place] [address]  PCP:  Jodelle Green, FNP  Cardiologist:  Jenkins Rouge, MD Electrophysiologist:  None   Chief Complaint:  Shortness of breath  History of Present Illness:    Bonnie Ryan is a 63 y.o. female who presents via audio/video conferencing for a telehealth visit today.  She has a history of mitral valve prolapse with mild mitral regurgitation by echo in 10/2016, non-obstructive CAD by cath in 2008, hypertension, hyperlipidemia, and type 2 diabetes mellitus.  She was last seen by Dr. Johnsie Cancel in 09/2016 after having followed with Dr. Verl Blalock for many years.  She would like to establish care in the [place] office, which is more convenient for her.  Bonnie Ryan notes a long history of shortness of breath, orthopnea, and orthostatic lightheadedness, some of which dates back to her teenage years when she was first diagnosed with mitral valve prolapse.  Over the last 8 months, she has noticed significant decline in her functional capacity secondary to shortness of breath.  Even walking between rooms in her home causes her to feel  out of breath.  She notes that she has also put on 30 pounds, though she is unsure if some of this edema or adiposity related to being less active.  She finds herself becoming very tired towards the Johnta Couts of the day, needing to lie down as soon as she gets home from work.  Up until last year, she would routinely go to the gym after work.  She reports occasional ankle swelling; in the past she used furosemide on a prn basis.  Over the last several months, she has been taking 20 mg daily in an attempt to improve her breathing and control her swelling.  She has chronic orthopnea, but notes that she is now sleeping on 4 pillows.  Bonnie Ryan endorses occasional chest pain, though she has a hard time describing its quality and frequency.  She does not feel like this is getting worse.  She notes intermittent palpitations as well as frequent orthostatic lightheadedness.  She has learned to deal with the lightheadedness and tries to stand up slowly to minimize the symptoms.  Bonnie Ryan ran out of losartan ~6 weeks ago, as her pharmacy was unable to procure the 50 mg tablets that she was taking daily.  Prior to stopping the medication, her BP was routinely low normal an on at least one occasion quite low, with systolic readings in the 59'D.  She has not checked her blood pressure regularly since stopping losartan, though her BP was noted to be moderately elevated during a visit for evaluation of a URI at [facility] earlier this month (  BP 165/97).  The patient does not endorse symptoms concerning for COVID-19 infection (fever, chills, cough, or new SHORTNESS OF BREATH).    Prior CV studies:   The following studies were reviewed today:  TTE (11/04/2016): Mildly dilated LV with LVEF 60-65%.  Normal wall motion with grade 2 diastolic dysfunction.  MAC with mildly thickened/calcified mitral valve.  Prolapse of both leaflets noted with mild late-systolic regurgitation.  Severe left atrial enlargement.  Normal RV  size and function.  Normal RVSP.  Past Medical History:  Diagnosis Date  . CAD (coronary artery disease)    Nonbstructive CAD by cath 09/2006 30% LAD, 25% RCA. Normal EF of 55-60% by echo 10/2009 with normal wall thickness.; ETT-Myoview 07/28/11: Normal without ischemia, EF 75%. ; LHC 12/12:   LM calcified, mod LAD calcification, mid LAD 30-40%, prox RCA 30-40%, EF 55-65%, 2+ MR  . Diabetes mellitus    Previously diet-controlled  . Endometriosis   . Hypercholesteremia   . Hypertension   . Mitral valve prolapse    moderate by echo 10/2009 with mild MR;  Echocardiogram 07/23/11: Normal LV wall thickness, EF 60%, normal wall motion, moderately severe bileaflet mitral valve prolapse with mild-moderate MR.  This was not felt to be significantly different from prior echo;  [place] 12/12: RA 2, RV 19/2, PA 18/6 with a mean of 12, PCWP 4, LV 103/57 with a mean of 76, AO 103/10;  CO 3.7, CI 1.9, O2 - PA 67%, Ao 97%  . UTI (urinary tract infection)    Past Surgical History:  Procedure Laterality Date  . ABDOMINAL HYSTERECTOMY     tah/bso  . APPENDECTOMY    . CHOLECYSTECTOMY    . KNEE SURGERY    . LAPAROSCOPY    . US ECHOCARDIOGRAPHY     EF 55-60%     Current Meds  Medication Sig  . furosemide (LASIX) 20 MG tablet Take 1 tablet (20 mg) by mouth once a day. Take 2 tablets (40 mg) by mouth once a day if worsening shortness of breath, edema, or weight gain.  . metFORMIN (GLUCOPHAGE) 500 MG tablet Take by mouth 2 (two) times daily with a meal.  . metoprolol succinate (TOPROL-XL) 50 MG 24 hr tablet TAKE 1 TABLET (50 MG TOTAL) BY MOUTH DAILY. TAKE WITH OR IMMEDIATELY FOLLOWING A MEAL.  . rosuvastatin (CRESTOR) 20 MG tablet Take 1 tablet (20 mg total) by mouth daily.  Marland Kitchen VICTOZA 18 MG/3ML SOPN Inject 0.3 mLs (1.8 mg total) into the skin daily.  Marland Kitchen zolpidem (AMBIEN CR) 6.25 MG CR tablet Take 1 tablet (6.25 mg total) by mouth at bedtime as needed for sleep.  . [DISCONTINUED] furosemide (LASIX) 20 MG tablet Take  20 mg by mouth.  . [DISCONTINUED] metoprolol succinate (TOPROL-XL) 50 MG 24 hr tablet TAKE 1 TABLET (50 MG TOTAL) BY MOUTH DAILY. TAKE WITH OR IMMEDIATELY FOLLOWING A MEAL.  . [DISCONTINUED] rosuvastatin (CRESTOR) 20 MG tablet Take 20 mg by mouth daily.     Allergies:   Penicillins   Social History   Tobacco Use  . Smoking status: Current Every Day Smoker    Packs/day: 0.25    Years: 20.00    Pack years: 5.00    Types: Cigarettes    Last attempt to quit: 07/24/2015    Years since quitting: 3.3  . Smokeless tobacco: Never Used  Substance Use Topics  . Alcohol use: Yes    Alcohol/week: 5.0 standard drinks    Types: 5 Standard drinks or equivalent per week  .  Drug use: No     Family Hx: The patient's family history includes Breast cancer (age of onset: 44) in her mother; Diabetes in her mother; Hypertension in her father. There is no history of Ovarian cancer, Colon cancer, or Heart disease.  ROS:   Please see the history of present illness.   All other systems reviewed and are negative.   Labs/Other Tests and Data Reviewed:    Recent Labs: 09/01/2018: ALT 33; BUN 29; Creat 0.94; Hemoglobin 12.2; Platelets 175; Potassium 3.8; Sodium 140; TSH 0.98   Recent Lipid Panel Lab Results  Component Value Date/Time   CHOL 265 (H) 09/01/2018 02:53 PM   TRIG 664 (H) 09/01/2018 02:53 PM   HDL 54 09/01/2018 02:53 PM   CHOLHDL 4.9 09/01/2018 02:53 PM   [place]  09/01/2018 02:53 PM     Comment:     . LDL cholesterol not calculated. Triglyceride levels greater than 400 mg/dL invalidate calculated LDL results. . Reference range: <100 . Desirable range <100 mg/dL for primary prevention;   <70 mg/dL for patients with CHD or diabetic patients  with > or = 2 CHD risk factors. Marland Kitchen LDL-C is now calculated using the Martin-Hopkins  calculation, which is a validated novel method providing  better accuracy than the Friedewald equation in the  estimation of LDL-C.  Cresenciano Genre et al. Annamaria Helling.  9935;701(77): 2061-2068  (http://education.[employer].com/faq/FAQ164)    LDLDIRECT 240.5 09/08/2006 04:39 PM    Wt Readings from Last 3 Encounters:  11/18/18 180 lb (81.6 kg)  09/01/18 190 lb 9.6 oz (86.5 kg)  04/11/18 182 lb (82.6 kg)     Exam:    Vital Signs: None available  ASSESSMENT & PLAN:    Chronic HFpEF and mitral valve prolapse: Ms. Kisner describes worsening heart failure symptoms over the last 8 months.  I have recommended increasing furosemide to 40 mg daily if she has weight gain, edema, or worsening shortness of breath.  We will make arrangements for transthoracic echocardiogram and office visit in 2 weeks.  Hypertension: BP noted to be high at recent urgent care visits in the setting of URI and discontinuation of losartan ~6 weeks ago.  In the past, while on losartan 50 mg daily, Ms. Kern reports having low normal to frankly low BP.  I have recommended that we restart losartan at 25 mg daily with BP check +/- labs when she is seen in the office in 2 weeks.  COVID-19 Education: The signs and symptoms of COVID-19 were discussed with the patient and how to seek care for testing (follow up with PCP or arrange E-visit).  The importance of social distancing was discussed today.  Patient Risk:   After full review of this patients clinical status, I feel that they are at least moderate risk at this time.  Time:   Today, I have spent 32 minutes with the patient with telehealth technology discussing heart failure, mitral valve prolapse, and COVID-19 precaution.     Medication Adjustments/Labs and Tests Ordered: Current medicines are reviewed at length with the patient today.  Concerns regarding medicines are outlined above.   Tests Ordered: Orders Placed This Encounter  Procedures  . ECHOCARDIOGRAM COMPLETE   Medication Changes: Meds ordered this encounter  Medications  . losartan (COZAAR) 25 MG tablet    Sig: Take 1 tablet (25 mg total) by mouth daily.     Dispense:  90 tablet    Refill:  3  . furosemide (LASIX) 20 MG tablet    Sig: Take  1 tablet (20 mg) by mouth once a day. Take 2 tablets (40 mg) by mouth once a day if worsening shortness of breath, edema, or weight gain.    Dispense:  90 tablet    Refill:  2  . metoprolol succinate (TOPROL-XL) 50 MG 24 hr tablet    Sig: TAKE 1 TABLET (50 MG TOTAL) BY MOUTH DAILY. TAKE WITH OR IMMEDIATELY FOLLOWING A MEAL.    Dispense:  90 tablet    Refill:  1  . rosuvastatin (CRESTOR) 20 MG tablet    Sig: Take 1 tablet (20 mg total) by mouth daily.    Dispense:  90 tablet    Refill:  3    Disposition:  in 2 week(s)  Signed, Nelva Bush, MD  11/18/2018 8:03 PM    [facility]

## 2018-11-16 NOTE — Telephone Encounter (Signed)
Cardiac Questionnaire:  Since your last visit or hospitalization:  1. Have you been having chest pain? no  2. Have you been having shortness of breath? no  3. Have you been having increasing edema, wt gain, or increase in abdominal girth (pants fitting more tightly)? no  4. Have you had any passing out spells? No  ?  COVID-19 Pre-Screening Questions:  . Have you been in contact with someone that was recently sick with fever/cough or confirmed to have the Covid19 virus? no  *Contact with a confirmed case should stay at home, away from confirmed patient, monitor symptoms, and reach out to PCP for e-visit/additional testing.  2. Do you have any of the following symptoms [cough, fever (100.4 or greater)], and/or shortness of breath)? No  ?   ?  Pt reports hx of mitral valve issues and would prefer to see MD in clinic. She has not previously seen provider in our office and I made her aware that medication questions and dosages would need to go through them.   She agreed. Consent sent to patient in mychart. She will return call to schedule telephone visit.   Advised pt to call for any further questions or concerns.

## 2018-11-17 ENCOUNTER — Telehealth: Payer: Self-pay | Admitting: *Deleted

## 2018-11-17 ENCOUNTER — Encounter: Payer: Self-pay | Admitting: Internal Medicine

## 2018-11-17 ENCOUNTER — Telehealth: Payer: Self-pay | Admitting: Internal Medicine

## 2018-11-17 ENCOUNTER — Other Ambulatory Visit: Payer: Self-pay

## 2018-11-17 ENCOUNTER — Telehealth (INDEPENDENT_AMBULATORY_CARE_PROVIDER_SITE_OTHER): Payer: Managed Care, Other (non HMO) | Admitting: Internal Medicine

## 2018-11-17 DIAGNOSIS — F17210 Nicotine dependence, cigarettes, uncomplicated: Secondary | ICD-10-CM

## 2018-11-17 DIAGNOSIS — I341 Nonrheumatic mitral (valve) prolapse: Secondary | ICD-10-CM

## 2018-11-17 DIAGNOSIS — Z79899 Other long term (current) drug therapy: Secondary | ICD-10-CM

## 2018-11-17 DIAGNOSIS — I10 Essential (primary) hypertension: Secondary | ICD-10-CM | POA: Diagnosis not present

## 2018-11-17 DIAGNOSIS — I5032 Chronic diastolic (congestive) heart failure: Secondary | ICD-10-CM

## 2018-11-17 MED ORDER — LOSARTAN POTASSIUM 25 MG PO TABS: 25.0000 mg | ORAL_TABLET | Freq: Every day | ORAL | 3 refills | Status: DC

## 2018-11-17 MED ORDER — ROSUVASTATIN CALCIUM 20 MG PO TABS: 20.0000 mg | ORAL_TABLET | Freq: Every day | ORAL | 3 refills | Status: DC

## 2018-11-17 MED ORDER — FUROSEMIDE 20 MG PO TABS: ORAL_TABLET | ORAL | 2 refills | Status: DC

## 2018-11-17 MED ORDER — METOPROLOL SUCCINATE ER 50 MG PO TB24: ORAL_TABLET | ORAL | 1 refills | Status: DC

## 2018-11-17 NOTE — Telephone Encounter (Signed)
ENTERED IN ERROR

## 2018-11-17 NOTE — Telephone Encounter (Signed)
Attempted to schedule Echo in 6 wks with FU same day    No ans no vm

## 2018-11-17 NOTE — Telephone Encounter (Signed)
Patient verbalized understanding of instructions as listed on AVS from telehealth visit today with Dr End. Prescriptions sent to pharmacy. She was appreciative.

## 2018-11-17 NOTE — Patient Instructions (Addendum)
Medication Instructions:  Your physician has recommended you make the following change in your medication:  1- RESTART Losartan 25 mg by mouth once a day. 2- CONTINUE furosemide 20 mg by mouth once a day.  May take furosemide 40 mg by mouth once a day if you experience worsening shortness of breath, edema or weight gain.  If you need a refill on your cardiac medications before your next appointment, please call your pharmacy.   Lab work: none If you have labs (blood work) drawn today and your tests are completely normal, you will receive your results only by: Marland Kitchen MyChart Message (if you have MyChart) OR . A paper copy in the mail If you have any lab test that is abnormal or we need to change your treatment, we will call you to review the results.  Testing/Procedures: Your physician has requested that you have an echocardiogram in 6 weeks on the same day as office visit. Echocardiography is a painless test that uses sound waves to create images of your heart. It provides your doctor with information about the size and shape of your heart and how well your heart's chambers and valves are working. This procedure takes approximately one hour. There are no restrictions for this procedure. You may get an IV, if needed, to receive an ultrasound enhancing agent through to better visualize your heart.    Follow-Up: At [facility], you and your health needs are our priority.  As part of our continuing mission to provide you with exceptional heart care, we have created designated Provider Care Teams.  These Care Teams include your primary Cardiologist (physician) and Advanced Practice Providers (APPs -  Physician Assistants and Nurse Practitioners) who all work together to provide you with the care you need, when you need it. You will need a follow up appointment in 6 weeks in office with echo preferable before the visit.  Please call our office 2 months in advance to schedule this appointment.  You may  see DR Cristal Deer END or one of the following Advanced Practice Providers on your designated Care Team:   Nicolasa Ducking, NP Eula Listen, PA-C . Marisue Ivan, PA-C     Echocardiogram An echocardiogram is a procedure that uses painless sound waves (ultrasound) to produce an image of the heart. Images from an echocardiogram can provide important information about:  Signs of coronary artery disease (CAD).  Aneurysm detection. An aneurysm is a weak or damaged part of an artery wall that bulges out from the normal force of blood pumping through the body.  Heart size and shape. Changes in the size or shape of the heart can be associated with certain conditions, including heart failure, aneurysm, and CAD.  Heart muscle function.  Heart valve function.  Signs of a past heart attack.  Fluid buildup around the heart.  Thickening of the heart muscle.  A tumor or infectious growth around the heart valves. Tell a health care provider about:  Any allergies you have.  All medicines you are taking, including vitamins, herbs, eye drops, creams, and over-the-counter medicines.  Any blood disorders you have.  Any surgeries you have had.  Any medical conditions you have.  Whether you are pregnant or may be pregnant. What are the risks? Generally, this is a safe procedure. However, problems may occur, including:  Allergic reaction to dye (contrast) that may be used during the procedure. What happens before the procedure? No specific preparation is needed. You may eat and drink normally. What happens during  the procedure?   An IV tube may be inserted into one of your veins.  You may receive contrast through this tube. A contrast is an injection that improves the quality of the pictures from your heart.  A gel will be applied to your chest.  A wand-like tool (transducer) will be moved over your chest. The gel will help to transmit the sound waves from the transducer.  The  sound waves will harmlessly bounce off of your heart to allow the heart images to be captured in real-time motion. The images will be recorded on a computer. The procedure may vary among health care providers and hospitals. What happens after the procedure?  You may return to your normal, everyday life, including diet, activities, and medicines, unless your health care provider tells you not to do that. Summary  An echocardiogram is a procedure that uses painless sound waves (ultrasound) to produce an image of the heart.  Images from an echocardiogram can provide important information about the size and shape of your heart, heart muscle function, heart valve function, and fluid buildup around your heart.  You do not need to do anything to prepare before this procedure. You may eat and drink normally.  After the echocardiogram is completed, you may return to your normal, everyday life, unless your health care provider tells you not to do that. This information is not intended to replace advice given to you by your health care provider. Make sure you discuss any questions you have with your health care provider. Document Released: 08/06/2000 Document Revised: 09/11/2016 Document Reviewed: 09/11/2016 Elsevier Interactive Patient Education  2019 [employer].

## 2018-11-18 ENCOUNTER — Encounter: Payer: Self-pay | Admitting: Emergency Medicine

## 2018-11-18 ENCOUNTER — Other Ambulatory Visit: Payer: Self-pay

## 2018-11-18 ENCOUNTER — Emergency Department
Admission: EM | Admit: 2018-11-18 | Discharge: 2018-11-18 | Disposition: A | Discharge status: Home / Self Care | Payer: Managed Care, Other (non HMO) | Attending: Emergency Medicine | Admitting: Emergency Medicine

## 2018-11-18 ENCOUNTER — Emergency Department: Payer: Managed Care, Other (non HMO)

## 2018-11-18 DIAGNOSIS — I10 Essential (primary) hypertension: Secondary | ICD-10-CM | POA: Insufficient documentation

## 2018-11-18 DIAGNOSIS — Z7984 Long term (current) use of oral hypoglycemic drugs: Secondary | ICD-10-CM | POA: Insufficient documentation

## 2018-11-18 DIAGNOSIS — Y9389 Activity, other specified: Secondary | ICD-10-CM | POA: Insufficient documentation

## 2018-11-18 DIAGNOSIS — E119 Type 2 diabetes mellitus without complications: Secondary | ICD-10-CM | POA: Diagnosis not present

## 2018-11-18 DIAGNOSIS — W01198A Fall on same level from slipping, tripping and stumbling with subsequent striking against other object, initial encounter: Secondary | ICD-10-CM | POA: Diagnosis not present

## 2018-11-18 DIAGNOSIS — Y999 Unspecified external cause status: Secondary | ICD-10-CM | POA: Insufficient documentation

## 2018-11-18 DIAGNOSIS — Y92007 Garden or yard of unspecified non-institutional (private) residence as the place of occurrence of the external cause: Secondary | ICD-10-CM | POA: Diagnosis not present

## 2018-11-18 DIAGNOSIS — S8262XA Displaced fracture of lateral malleolus of left fibula, initial encounter for closed fracture: Secondary | ICD-10-CM | POA: Insufficient documentation

## 2018-11-18 DIAGNOSIS — F17210 Nicotine dependence, cigarettes, uncomplicated: Secondary | ICD-10-CM | POA: Diagnosis not present

## 2018-11-18 DIAGNOSIS — S99912A Unspecified injury of left ankle, initial encounter: Secondary | ICD-10-CM | POA: Diagnosis present

## 2018-11-18 DIAGNOSIS — I2510 Atherosclerotic heart disease of native coronary artery without angina pectoris: Secondary | ICD-10-CM | POA: Diagnosis not present

## 2018-11-18 DIAGNOSIS — Z79899 Other long term (current) drug therapy: Secondary | ICD-10-CM | POA: Diagnosis not present

## 2018-11-18 MED ORDER — MELOXICAM 15 MG PO TABS: 15.0000 mg | ORAL_TABLET | Freq: Every day | ORAL | 1 refills | Status: AC

## 2018-11-18 MED ORDER — OXYCODONE-ACETAMINOPHEN 5-325 MG PO TABS
1.0000 | ORAL_TABLET | Freq: Once | ORAL | Status: AC
  Administered 2018-11-18: 1 via ORAL
  Filled 2018-11-18: qty 1

## 2018-11-18 MED ORDER — OXYCODONE-ACETAMINOPHEN 5-325 MG PO TABS: 1.0000 | ORAL_TABLET | ORAL | 0 refills | Status: AC | PRN

## 2018-11-18 NOTE — ED Notes (Signed)
Pt verbalized understanding of discharge instructions. NAD at this time. 

## 2018-11-18 NOTE — ED Provider Notes (Signed)
[facility] Emergency Department Provider Note  ____________________________________________  Time seen: Approximately 8:32 PM  I have reviewed the triage vital signs and the nursing notes.   HISTORY  Chief Complaint Ankle Pain    HPI Bonnie Ryan is a 63 y.o. female presents to the emergency department with right lateral ankle pain.  Patient reports that she was playing out in the yard with her grandsons when she tripped over a brick pathway.  Patient reports that she did hit her head and sustained an inversion type ankle injury.  She denies blurry vision, nausea, vomiting, disorientation, confusion or subjective weakness in the upper or lower extremities.  Patient has not been able to bear weight on the affected lower extremity since incident occurred.  Patient denies blood thinner use.  No other alleviating measures been attempted.   Past Medical History:  Diagnosis Date  . CAD (coronary artery disease)    Nonbstructive CAD by cath 09/2006 30% LAD, 25% RCA. Normal EF of 55-60% by echo 10/2009 with normal wall thickness.; ETT-Myoview 07/28/11: Normal without ischemia, EF 75%. ; LHC 12/12:   LM calcified, mod LAD calcification, mid LAD 30-40%, prox RCA 30-40%, EF 55-65%, 2+ MR  . Diabetes mellitus    Previously diet-controlled  . Endometriosis   . Hypercholesteremia   . Hypertension   . Mitral valve prolapse    moderate by echo 10/2009 with mild MR;  Echocardiogram 07/23/11: Normal LV wall thickness, EF 60%, normal wall motion, moderately severe bileaflet mitral valve prolapse with mild-moderate MR.  This was not felt to be significantly different from prior echo;  RHC 12/12: RA 2, RV 19/2, PA 18/6 with a mean of 12, PCWP 4, LV 103/57 with a mean of 76, AO 103/10;  CO 3.7, CI 1.9, O2 - PA 67%, Ao 97%  . UTI (urinary tract infection)     Patient Active Problem List   Diagnosis Date Noted  . Diabetes (HCC) 04/11/2018  . Carotid stenosis 04/11/2018  . Unstable  bladder 03/17/2016  . Vaginal atrophy 03/17/2016  . Cystocele 03/17/2016  . Nocturia 03/17/2016  . Urinary frequency 03/17/2016  . Stress incontinence 03/17/2016  . Abnormal resting ECG findings 02/26/2013  . Dizziness 08/11/2011  . Shortness of breath 07/30/2011  . Essential hypertension 07/30/2011  . HLD (hyperlipidemia) 07/30/2011  . Edema 07/22/2011  . CAD, NATIVE VESSEL 10/28/2009  . MITRAL VALVE PROLAPSE 10/28/2009  . CHEST PAIN 10/28/2009    Past Surgical History:  Procedure Laterality Date  . ABDOMINAL HYSTERECTOMY     tah/bso  . APPENDECTOMY    . CHOLECYSTECTOMY    . KNEE SURGERY    . LAPAROSCOPY    . US ECHOCARDIOGRAPHY     EF 55-60%    Prior to Admission medications   Medication Sig Start Date End Date Taking? Authorizing Provider  furosemide (LASIX) 20 MG tablet Take 1 tablet (20 mg) by mouth once a day. Take 2 tablets (40 mg) by mouth once a day if worsening shortness of breath, edema, or weight gain. 11/17/18   End, Cristal Deer, MD  losartan (COZAAR) 25 MG tablet Take 1 tablet (25 mg total) by mouth daily. 11/17/18 02/15/19  End, Cristal Deer, MD  meloxicam (MOBIC) 15 MG tablet Take 1 tablet (15 mg total) by mouth daily for 7 days. 11/18/18 11/25/18  Orvil Feil, PA-C  metFORMIN (GLUCOPHAGE) 500 MG tablet Take by mouth 2 (two) times daily with a meal.    [provider]  metoprolol succinate (TOPROL-XL) 50 MG  24 hr tablet TAKE 1 TABLET (50 MG TOTAL) BY MOUTH DAILY. TAKE WITH OR IMMEDIATELY FOLLOWING A MEAL. 11/17/18   End, Cristal Deerhristopher, MD  oxyCODONE-acetaminophen (PERCOCET/ROXICET) 5-325 MG tablet Take 1 tablet by mouth every 4 (four) hours as needed for up to 5 days for severe pain. 11/18/18 11/23/18  Orvil FeilWoods, [place] Kuang M, PA-C  rosuvastatin (CRESTOR) 20 MG tablet Take 1 tablet (20 mg total) by mouth daily. 11/17/18   End, Cristal Deerhristopher, MD  VICTOZA 18 MG/3ML SOPN Inject 0.3 mLs (1.8 mg total) into the skin daily. 09/11/18   Tracey HarriesGuse, Lauren M, FNP  zolpidem (AMBIEN CR)  6.25 MG CR tablet Take 1 tablet (6.25 mg total) by mouth at bedtime as needed for sleep. 09/12/18   Tracey HarriesGuse, Lauren M, FNP    Allergies Penicillins  Family History  Problem Relation Age of Onset  . Diabetes Mother   . Breast cancer Mother [id number]  . Hypertension Father   . Ovarian cancer Neg Hx   . Colon cancer Neg Hx   . Heart disease Neg Hx     Social History Social History   Tobacco Use  . Smoking status: Current Every Day Smoker    Packs/day: 0.25    Years: 20.00    Pack years: 5.00    Types: Cigarettes    Last attempt to quit: 07/24/2015    Years since quitting: 3.3  . Smokeless tobacco: Never Used  Substance Use Topics  . Alcohol use: Yes    Alcohol/week: 5.0 standard drinks    Types: 5 Standard drinks or equivalent per week  . Drug use: No     Review of Systems  Constitutional: No fever/chills Eyes: No visual changes. No discharge ENT: No upper respiratory complaints. Cardiovascular: no chest pain. Respiratory: no cough. No SOB. Gastrointestinal: No abdominal pain.  No nausea, no vomiting.  No diarrhea.  No constipation. Genitourinary: Negative for dysuria. No hematuria Musculoskeletal: Patient has right lateral ankle pain.  Skin: Negative for rash, abrasions, lacerations, ecchymosis. Neurological: Negative for headaches, focal weakness or numbness.   ____________________________________________   PHYSICAL EXAM:  VITAL SIGNS: ED Triage Vitals [11/18/18 1935]  Enc Vitals Group     BP      Pulse      Resp      Temp      Temp src      SpO2      Weight 180 lb (81.6 kg)     Height 5\' 7"  (1.702 m)     Head Circumference      Peak Flow      Pain Score 7     Pain Loc      Pain Edu?      Excl. in GC?      Constitutional: Alert and oriented. Well appearing and in no acute distress. Eyes: Conjunctivae are normal. PERRL. EOMI. Head: Atraumatic.  No palpable hematoma.  No ecchymosis behind pinna bilaterally. Cardiovascular: Normal rate, regular rhythm.  Normal S1 and S2.  Good peripheral circulation. Respiratory: Normal respiratory effort without tachypnea or retractions. Lungs CTAB. Good air entry to the bases with no decreased or absent breath sounds. Musculoskeletal: Patient has 5 out of 5 strength in the upper and lower extremities bilaterally and symmetrically.  Patient performs limited range of motion at the right ankle, likely secondary to pain.  She has tenderness to palpation over the lateral malleolus. Neurologic:  Normal speech and language. No gross focal neurologic deficits are appreciated.  Skin:  Skin is warm, dry and intact.  No rash noted. Psychiatric: Mood and affect are normal. Speech and behavior are normal. Patient exhibits appropriate insight and judgement.   ____________________________________________   LABS (all labs ordered are listed, but only abnormal results are displayed)  Labs Reviewed - No data to display ____________________________________________  EKG   ____________________________________________  RADIOLOGY I personally viewed and evaluated these images as part of my medical decision making, as well as reviewing the written report by the radiologist  Dg Ankle Complete Right  Result Date: 11/18/2018 CLINICAL DATA:  Pain after fall. EXAM: RIGHT ANKLE - COMPLETE 3+ VIEW COMPARISON:  None. FINDINGS: There is a fracture through the distal fibula with mild displacement and overlying soft tissue swelling. The ankle mortise is intact. No other acute abnormalities. IMPRESSION: Mildly displaced distal fibular fracture with overlying soft tissue swelling. Electronically Signed   By: Gerome Sam III M.D   On: 11/18/2018 20:11    ____________________________________________    PROCEDURES  Procedure(s) performed:    Procedures    Medications  oxyCODONE-acetaminophen (PERCOCET/ROXICET) 5-325 MG per tablet 1 tablet (1 tablet Oral Given 11/18/18 2027)      ____________________________________________   INITIAL IMPRESSION / ASSESSMENT AND PLAN / ED COURSE  Pertinent labs & imaging results that were available during my care of the patient were reviewed by me and considered in my medical decision making (see chart for details).  Review of the [place] CSRS was performed in accordance of the [facility] prior to dispensing any controlled drugs.      Assessment and plan Fall Lateral malleolus fracture Patient presents to the emergency department after a fall that occurred earlier in the evening.  Patient sustained an inversion type right ankle injury and also hit her head.  Patient's initial neurologic exam was reassuring.  I recommended CT head but patient declined imaging at this time.  Strict return precautions were given to return to the emergency department for new or worsening symptoms.  Patient was cautioned to return to the emergency department with worsening headache, blurry vision or disorientation.  Patient son lives within 3 miles of patient and patient also has another family member living in her home they can stay with her tonight.  A mildly displaced lateral malleolus fracture was identified on x-ray examination of the right ankle.  Patient was placed in a posterior short leg splint in the emergency department and Roxicet was given for pain.  Patient was discharged with Roxicet meloxicam and a referral was given to orthopedist on-call, Dr. Joice Lofts.  All patient questions were answered.    ____________________________________________  FINAL CLINICAL IMPRESSION(S) / ED DIAGNOSES  Final diagnoses:  Closed displaced fracture of lateral malleolus of left fibula, initial encounter      NEW MEDICATIONS STARTED DURING THIS VISIT:  ED Discharge Orders         Ordered    oxyCODONE-acetaminophen (PERCOCET/ROXICET) 5-325 MG tablet  Every 4 hours PRN     11/18/18 2027    meloxicam (MOBIC) 15 MG tablet  Daily     11/18/18 2027               This chart was dictated using voice recognition software/Dragon. Despite best efforts to proofread, errors can occur which can change the meaning. Any change was purely unintentional.    Gasper Lloyd 11/18/18 2037    Nita Sickle, MD 11/18/18 2249

## 2018-11-18 NOTE — ED Triage Notes (Signed)
Pt to ED with c/o right ankle pain after tripping and falling. Pt states she also hit her head after falling but denies LOC.

## 2018-11-21 NOTE — Telephone Encounter (Signed)
Scheduled may 15th for both

## 2019-01-02 ENCOUNTER — Other Ambulatory Visit: Payer: Managed Care, Other (non HMO)

## 2019-01-05 ENCOUNTER — Ambulatory Visit: Payer: Managed Care, Other (non HMO) | Admitting: Internal Medicine

## 2019-01-05 ENCOUNTER — Other Ambulatory Visit: Payer: Managed Care, Other (non HMO)

## 2019-01-24 ENCOUNTER — Encounter: Payer: Self-pay | Admitting: Family Medicine

## 2019-01-24 ENCOUNTER — Other Ambulatory Visit: Payer: Self-pay | Admitting: Family Medicine

## 2019-01-24 DIAGNOSIS — G4700 Insomnia, unspecified: Secondary | ICD-10-CM

## 2019-01-24 DIAGNOSIS — Z716 Tobacco abuse counseling: Secondary | ICD-10-CM

## 2019-01-25 ENCOUNTER — Other Ambulatory Visit: Payer: Self-pay | Admitting: Lab

## 2019-01-25 DIAGNOSIS — G4700 Insomnia, unspecified: Secondary | ICD-10-CM

## 2019-01-25 MED ORDER — ZOLPIDEM TARTRATE ER 6.25 MG PO TBCR: 6.2500 mg | EXTENDED_RELEASE_TABLET | Freq: Every evening | ORAL | 0 refills | Status: DC | PRN

## 2019-01-25 MED ORDER — VARENICLINE TARTRATE 0.5 MG X 11 & 1 MG X 42 PO MISC: ORAL | 0 refills | Status: DC

## 2019-01-25 NOTE — Telephone Encounter (Signed)
Bonnie Ryan -- please call to set up 1 month follow up after starting chantix  I sent in starting month of chantix and like to do a follow up appt with patient after the first month see how it is all going  Thanks  LG

## 2019-01-25 NOTE — Telephone Encounter (Signed)
Called Pt No answer, left a message for Pt to call the office to set up an appt also her Rx was sent to the pharmacy. I will try Pt back later.

## 2019-01-26 NOTE — Telephone Encounter (Signed)
2nd day Called Pt No answer, left a message for Pt to call the office to set up an appt,  I will try Pt back later.

## 2019-01-29 NOTE — Telephone Encounter (Signed)
F/U appt scheduled 02/27/2019 @ 11:00am

## 2019-02-10 ENCOUNTER — Other Ambulatory Visit: Payer: Self-pay | Admitting: Internal Medicine

## 2019-02-14 LAB — HM DIABETES EYE EXAM

## 2019-02-22 ENCOUNTER — Encounter: Payer: Self-pay | Admitting: Lab

## 2019-02-27 ENCOUNTER — Other Ambulatory Visit: Payer: Self-pay

## 2019-02-27 ENCOUNTER — Ambulatory Visit (INDEPENDENT_AMBULATORY_CARE_PROVIDER_SITE_OTHER): Payer: Managed Care, Other (non HMO) | Admitting: Family Medicine

## 2019-02-27 DIAGNOSIS — E1169 Type 2 diabetes mellitus with other specified complication: Secondary | ICD-10-CM

## 2019-02-27 DIAGNOSIS — Z716 Tobacco abuse counseling: Secondary | ICD-10-CM

## 2019-02-27 DIAGNOSIS — I10 Essential (primary) hypertension: Secondary | ICD-10-CM

## 2019-02-27 DIAGNOSIS — E785 Hyperlipidemia, unspecified: Secondary | ICD-10-CM | POA: Diagnosis not present

## 2019-02-27 MED ORDER — VARENICLINE TARTRATE 1 MG PO TABS: 1.0000 mg | ORAL_TABLET | Freq: Two times a day (BID) | ORAL | 2 refills | Status: DC

## 2019-02-27 NOTE — Progress Notes (Signed)
Patient ID: Bonnie Ryan, female   DOB: 01-Aug-1956, 63 y.o.   MRN: [id number]    Virtual Visit via phone Note  This visit type was conducted due to national recommendations for restrictions regarding the COVID-19 pandemic (e.g. social distancing).  This format is felt to be most appropriate for this patient at this time.  All issues noted in this document were discussed and addressed.  No physical exam was performed (except for noted visual exam findings with Video Visits).   I connected with Erline Hau today at 11:00 AM EDT by verified that I am speaking with the correct person using two identifiers. Location patient: home Location provider: work or home office Persons participating in the virtual visit: patient, provider  I discussed the limitations, risks, security and privacy concerns of performing an evaluation and management service by telephone and the availability of in person appointments. I also discussed with the patient that there may be a patient responsible charge related to this service. The patient expressed understanding and agreed to proceed.  HPI:  Patient and I connected via telephone to follow-up on this week sensation after starting Chantix, blood pressure, diabetes, lipids.  Patient is up to the twice a day 1 mg dosing of Chantix.  Tolerating this medication without any adverse effects.  States she will smoke 1 cigarette a day here and there, but is at most smoking 5 to 7 cigarettes a week.  She is hopeful over the next 1 to 2 months she will be able to completely stop smoking.  Patient states she has not checked her blood sugar recently, but approximately 3 weeks ago blood sugar was 137.  Denies any feelings or symptoms of hypoglycemia.  States she is taking her metformin and Victoza without any issues.  Patient does not regularly check her blood pressure, but tolerating blood pressure medications without any side effects.  Denies chest pain, palpitations or lower  extremity swelling.  She is tolerating statins without any complaints, no body aches or abdominal pains.    ROS: See pertinent positives and negatives per HPI.  Past Medical History:  Diagnosis Date  . CAD (coronary artery disease)    Nonbstructive CAD by cath 09/2006 30% LAD, 25% RCA. Normal EF of 55-60% by echo 10/2009 with normal wall thickness.; ETT-Myoview 07/28/11: Normal without ischemia, EF 75%. ; LHC 12/12:   LM calcified, mod LAD calcification, mid LAD 30-40%, prox RCA 30-40%, EF 55-65%, 2+ MR  . Diabetes mellitus    Previously diet-controlled  . Endometriosis   . Hypercholesteremia   . Hypertension   . Mitral valve prolapse    moderate by echo 10/2009 with mild MR;  Echocardiogram 07/23/11: Normal LV wall thickness, EF 60%, normal wall motion, moderately severe bileaflet mitral valve prolapse with mild-moderate MR.  This was not felt to be significantly different from prior echo;  [place] 12/12: RA 2, RV 19/2, PA 18/6 with a mean of 12, PCWP 4, LV 103/57 with a mean of 76, AO 103/10;  CO 3.7, CI 1.9, O2 - PA 67%, Ao 97%  . UTI (urinary tract infection)     Past Surgical History:  Procedure Laterality Date  . ABDOMINAL HYSTERECTOMY     tah/bso  . APPENDECTOMY    . CHOLECYSTECTOMY    . KNEE SURGERY    . LAPAROSCOPY    . US ECHOCARDIOGRAPHY     EF 55-60%    Family History  Problem Relation Age of Onset  . Diabetes Mother   . Breast  cancer Mother [id number]  . Hypertension Father   . Ovarian cancer Neg Hx   . Colon cancer Neg Hx   . Heart disease Neg Hx    Social History   Tobacco Use  . Smoking status: Current Every Day Smoker    Packs/day: 0.25    Years: 20.00    Pack years: 5.00    Types: Cigarettes    Last attempt to quit: 07/24/2015    Years since quitting: 3.6  . Smokeless tobacco: Never Used  Substance Use Topics  . Alcohol use: Yes    Alcohol/week: 5.0 standard drinks    Types: 5 Standard drinks or equivalent per week    Current Outpatient Medications:  .   furosemide (LASIX) 20 MG tablet, Take 1 tablet (20 mg total) by mouth daily., Disp: 90 tablet, Rfl: 3 .  metFORMIN (GLUCOPHAGE) 500 MG tablet, Take by mouth 2 (two) times daily with a meal., Disp: , Rfl:  .  metoprolol succinate (TOPROL-XL) 50 MG 24 hr tablet, TAKE 1 TABLET (50 MG TOTAL) BY MOUTH DAILY. TAKE WITH OR IMMEDIATELY FOLLOWING A MEAL., Disp: 90 tablet, Rfl: 1 .  rosuvastatin (CRESTOR) 20 MG tablet, Take 1 tablet (20 mg total) by mouth daily., Disp: 90 tablet, Rfl: 3 .  varenicline (CHANTIX STARTING MONTH PAK) 0.5 MG X 11 & 1 MG X 42 tablet, Take one 0.5 mg tablet by mouth once daily for 3 days, then increase to one 0.5 mg tablet twice daily for 4 days, then increase to one 1 mg tablet twice daily., Disp: 53 tablet, Rfl: 0 .  VICTOZA 18 MG/3ML SOPN, Inject 0.3 mLs (1.8 mg total) into the skin daily., Disp: 5 pen, Rfl: 3 .  zolpidem (AMBIEN CR) 6.25 MG CR tablet, Take 1 tablet (6.25 mg total) by mouth at bedtime as needed for sleep., Disp: 30 tablet, Rfl: 0 .  losartan (COZAAR) 25 MG tablet, Take 1 tablet (25 mg total) by mouth daily., Disp: 90 tablet, Rfl: 3  EXAM:  GENERAL: alert, oriented, sounds well and in no acute distress  LUNGS: Speaking in full sentences, no signs of respiratory distress, breathing rate appears normal, no obvious gross SOB, gasping or wheezing   PSYCH/NEURO: pleasant and cooperative, no obvious depression or anxiety, speech and thought processing grossly intact  ASSESSMENT AND PLAN:  Discussed the following assessment and plan:  Smoking cessation-greater than 3 minutes spent discussing smoking cessation.  Patient is doing well on Chantix and is hopeful to be cigarette free in the next couple of months.  She will continue 1 mg twice daily dosing.  Also aware she can chew nicotine gum or nicotine lozenge as needed to help curb cigarette cravings.  Blood pressure- patient tolerating metoprolol and losartan without any adverse effects.   Hyperlipidemia-tolerating Crestor without any problems  Diabetes type 2 --patient tolerating metformin and Victoza without any side effects.  Encourage patient to start checking blood sugar at least a couple of times a week.  Asked if she needs a new meter prescription, states she does not have supplies at home.    I discussed the assessment and treatment plan with the patient. The patient was provided an opportunity to ask questions and all were answered. The patient agreed with the plan and demonstrated an understanding of the instructions.   The patient was advised to call back or seek an in-person evaluation if the symptoms worsen or if the condition fails to improve as anticipated.  I provided 23 minutes of non-face-to-face time  during this encounter.  We will plan to have patient follow-up here in approximately 2 or 3 months for an office visit so we can do new blood work at that time.  Tracey HarriesLauren M Gerasimos Plotts, FNP

## 2019-04-06 ENCOUNTER — Encounter: Payer: Self-pay | Admitting: Family Medicine

## 2019-04-09 ENCOUNTER — Other Ambulatory Visit: Payer: Self-pay | Admitting: Lab

## 2019-04-09 MED ORDER — METFORMIN HCL 500 MG PO TABS: 500.0000 mg | ORAL_TABLET | Freq: Two times a day (BID) | ORAL | 3 refills | Status: DC

## 2019-04-13 ENCOUNTER — Encounter (INDEPENDENT_AMBULATORY_CARE_PROVIDER_SITE_OTHER): Payer: Managed Care, Other (non HMO)

## 2019-04-13 ENCOUNTER — Ambulatory Visit (INDEPENDENT_AMBULATORY_CARE_PROVIDER_SITE_OTHER): Payer: Managed Care, Other (non HMO) | Admitting: Vascular Surgery

## 2019-05-04 ENCOUNTER — Encounter: Payer: Self-pay | Admitting: Family Medicine

## 2019-05-04 ENCOUNTER — Telehealth: Payer: Self-pay | Admitting: Lab

## 2019-05-04 NOTE — Telephone Encounter (Signed)
The following prescription needs to be refilled.  It just ran out and was originally issued by my previous doctor (Dr. Jonathon Jordan).  Rosuvastatin Calcium 20 MG  Thank you!  [employer]

## 2019-05-07 ENCOUNTER — Telehealth: Payer: Self-pay | Admitting: Lab

## 2019-05-07 DIAGNOSIS — E785 Hyperlipidemia, unspecified: Secondary | ICD-10-CM

## 2019-05-07 MED ORDER — ROSUVASTATIN CALCIUM 20 MG PO TABS: 20.0000 mg | ORAL_TABLET | Freq: Every day | ORAL | 3 refills | Status: DC

## 2019-05-07 NOTE — Addendum Note (Signed)
Addended by: Philis Nettle on: 05/07/2019 09:04 AM   Modules accepted: Orders

## 2019-05-07 NOTE — Telephone Encounter (Signed)
Pt called [facility] The following prescription needs to be refilled. It just ran out and was originally issued by my previous doctor (Dr. Jonathon Jordan).  Rosuvastatin Calcium 20 MG  Thank you!  [employer]

## 2019-05-07 NOTE — Telephone Encounter (Signed)
Can you do this as RX request?  Does this med meet your requirements for CMA to do refill?  It is easier for me if it is set up as refill request in system rather than phone message

## 2019-07-06 ENCOUNTER — Other Ambulatory Visit: Payer: Self-pay | Admitting: Family Medicine

## 2019-07-06 DIAGNOSIS — G4700 Insomnia, unspecified: Secondary | ICD-10-CM

## 2019-07-06 MED ORDER — ZOLPIDEM TARTRATE ER 6.25 MG PO TBCR: 6.2500 mg | EXTENDED_RELEASE_TABLET | Freq: Every evening | ORAL | 2 refills | Status: DC | PRN

## 2019-07-09 ENCOUNTER — Other Ambulatory Visit: Payer: Self-pay | Admitting: Internal Medicine

## 2019-07-09 MED ORDER — METOPROLOL SUCCINATE ER 50 MG PO TB24: ORAL_TABLET | ORAL | 0 refills | Status: DC

## 2019-07-09 NOTE — Telephone Encounter (Signed)
Requested Prescriptions   Signed Prescriptions Disp Refills  . metoprolol succinate (TOPROL-XL) 50 MG 24 hr tablet 90 tablet 0    Sig: TAKE 1 TABLET (50 MG TOTAL) BY MOUTH DAILY. TAKE WITH OR IMMEDIATELY FOLLOWING A MEAL.    Authorizing Provider: END, CHRISTOPHER    Ordering User: Raelene Bott, BRANDY L

## 2019-07-09 NOTE — Telephone Encounter (Signed)
°*  STAT* If patient is at the pharmacy, call can be transferred to refill team.   1. Which medications need to be refilled? (please list name of each medication and dose if known) metoprolol succinate 50 MG 1 tablet daily   2. Which pharmacy/location (including street and city if local pharmacy) is medication to be sent to? [facility] in [place]   3. Do they need a 30 day or 90 day supply? 90 day   Patient only has 3 pills left, patient has scheduled an appt with Dr End on 12/16 after an ECHO

## 2019-07-13 ENCOUNTER — Other Ambulatory Visit: Payer: Self-pay | Admitting: Family Medicine

## 2019-07-13 DIAGNOSIS — Z716 Tobacco abuse counseling: Secondary | ICD-10-CM

## 2019-08-03 ENCOUNTER — Ambulatory Visit (INDEPENDENT_AMBULATORY_CARE_PROVIDER_SITE_OTHER): Payer: Managed Care, Other (non HMO)

## 2019-08-03 ENCOUNTER — Other Ambulatory Visit: Payer: Self-pay

## 2019-08-03 DIAGNOSIS — I5032 Chronic diastolic (congestive) heart failure: Secondary | ICD-10-CM | POA: Diagnosis not present

## 2019-08-03 DIAGNOSIS — I341 Nonrheumatic mitral (valve) prolapse: Secondary | ICD-10-CM

## 2019-08-08 ENCOUNTER — Other Ambulatory Visit: Payer: Self-pay

## 2019-08-08 ENCOUNTER — Ambulatory Visit (INDEPENDENT_AMBULATORY_CARE_PROVIDER_SITE_OTHER): Payer: Managed Care, Other (non HMO) | Admitting: Internal Medicine

## 2019-08-08 ENCOUNTER — Other Ambulatory Visit
Admission: RE | Admit: 2019-08-08 | Discharge: 2019-08-08 | Disposition: A | Discharge status: Home / Self Care | Payer: Managed Care, Other (non HMO) | Source: Ambulatory Visit | Attending: Internal Medicine | Admitting: Internal Medicine

## 2019-08-08 ENCOUNTER — Encounter: Payer: Self-pay | Admitting: Internal Medicine

## 2019-08-08 VITALS — BP 120/80 | HR 79 | Ht 67.0 in | Wt 187.5 lb

## 2019-08-08 DIAGNOSIS — Z01812 Encounter for preprocedural laboratory examination: Secondary | ICD-10-CM | POA: Diagnosis present

## 2019-08-08 DIAGNOSIS — Z20828 Contact with and (suspected) exposure to other viral communicable diseases: Secondary | ICD-10-CM | POA: Insufficient documentation

## 2019-08-08 DIAGNOSIS — Z01810 Encounter for preprocedural cardiovascular examination: Secondary | ICD-10-CM

## 2019-08-08 DIAGNOSIS — I340 Nonrheumatic mitral (valve) insufficiency: Secondary | ICD-10-CM | POA: Diagnosis not present

## 2019-08-08 DIAGNOSIS — I341 Nonrheumatic mitral (valve) prolapse: Secondary | ICD-10-CM | POA: Diagnosis not present

## 2019-08-08 DIAGNOSIS — I38 Endocarditis, valve unspecified: Secondary | ICD-10-CM

## 2019-08-08 DIAGNOSIS — I5033 Acute on chronic diastolic (congestive) heart failure: Secondary | ICD-10-CM

## 2019-08-08 DIAGNOSIS — I10 Essential (primary) hypertension: Secondary | ICD-10-CM | POA: Diagnosis not present

## 2019-08-08 LAB — SARS CORONAVIRUS 2 (TAT 6-24 HRS): SARS Coronavirus 2: NEGATIVE

## 2019-08-08 NOTE — Patient Instructions (Addendum)
Medication Instructions:  Your physician has recommended you make the following change in your medication:  1- START Aspirin 81 mg by mouth once a day.  *If you need a refill on your cardiac medications before your next appointment, please call your pharmacy*  Lab Work: Your physician recommends that you return for lab work in: TODAY - CBC, BMET.  COVID PRE- TEST: You will need a COVID TEST prior to the procedure:  LOCATION: [facility]ru Testing site.  DATE/TIME:  Today 08/08/19.   If you have labs (blood work) drawn today and your tests are completely normal, you will receive your results only by: Marland Kitchen MyChart Message (if you have MyChart) OR . A paper copy in the mail If you have any lab test that is abnormal or we need to change your treatment, we will call you to review the results.  Testing/Procedures: Your physician has requested that you have a TEE. During a TEE, sound waves are used to create images of your heart. It provides your doctor with information about the size and shape of your heart and how well your heart's chambers and valves are working. In this test, a transducer is attached to the end of a flexible tube that's guided down your throat and into your esophagus (the tube leading from you mouth to your stomach) to get a more detailed image of your heart. You are not awake for the procedure. Please see the instruction sheet given to you today. For further information please visit [email or web address]   Your physician has requested that you have a RIGHT/LEFT cardiac catheterization. Cardiac catheterization is used to diagnose and/or treat various heart conditions. Doctors may recommend this procedure for a number of different reasons. The most common reason is to evaluate chest pain. Chest pain can be a symptom of coronary artery disease (CAD), and cardiac catheterization can show whether plaque is narrowing or blocking your heart's arteries. This procedure  is also used to evaluate the valves, as well as measure the blood flow and oxygen levels in different parts of your heart. For further information please visit [email or web address] Please follow instruction sheet, as given.     [facility] CARDIOVASCULAR DIVISION [facility] [place] [address] [name], [address] [place] [address] Dept: [phone] Loc: [phone]  Bonnie Ryan  08/08/2019  You are scheduled for a Cardiac Catheterization and TEE (Dr Flora Lipps) on Thursday, December 17 with Dr. Cristal De[place] End.  1. Please arrive at the [facility] (Main Entrance A) at [facility]: [address] [place], [place] [address] at 7:30 AM (This time is two hours before your procedure to ensure your preparation). Free valet parking service is available.   Special note: Every effort is made to have your procedure done on time. Please understand that emergencies sometimes delay scheduled procedures.  2. Diet: Do not eat solid foods after midnight.  The patient may have clear liquids until 5am upon the day of the procedure.  3. Labs: You will need to have blood drawn on TODAY in our office. You do not need to be fasting.  4. Medication instructions in preparation for your procedure:   Contrast Allergy: No   DO not take Victoza the morning of procedure.  Do not take Diabetes Med Glucophage (Metformin) on the day of the procedure and HOLD 48 HOURS AFTER THE PROCEDURE.  On the morning of your procedure, take your Aspirin and any morning medicines NOT listed above.  You may use  sips of water.  5. Plan for one night stay--bring personal belongings. 6. Bring a current list of your medications and current insurance cards. 7. You MUST have a responsible person to drive you home. 8. Someone MUST be with you the first 24 hours after you arrive home or your discharge will be delayed. 9. Please wear clothes that are easy to get on and off and wear slip-on  shoes.  Thank you for allowing [place] to care for you!   -- [facility]   Follow-Up: You have been referred to Dr Roxy Manns with Cardiothoracic Surgery. Someone will call to schedule.    At [facility], you and your health needs are our priority.  As part of our continuing mission to provide you with exceptional heart care, we have created designated Provider Care Teams.  These Care Teams include your primary Cardiologist (physician) and Advanced Practice Providers (APPs -  Physician Assistants and Nurse Practitioners) who all work together to provide you with the care you need, when you need it.  Your next appointment:   3-4 week(s)  The format for your next appointment:   In Person  Provider:    You may see DR Harrell Gave END or one of the following Advanced Practice Providers on your designated Care Team:    Murray Hodgkins, NP  Christell Faith, PA-C  Marrianne Mood, PA-C    Transesophageal Echocardiogram Transesophageal echocardiogram (TEE) is a test that uses sound waves to take pictures of your heart. TEE is done by passing a flexible tube down the esophagus. The esophagus is the tube that carries food from the throat to the stomach. The pictures give detailed images of your heart. This can help your doctor see if there are problems with your heart. What happens before the procedure? Staying hydrated Follow instructions from your doctor about hydration, which may include:  Up to 3 hours before the procedure - you may continue to drink clear liquids, such as: ? Water. ? Clear fruit juice. ? Black coffee. ? Plain tea.  Eating and drinking Follow instructions from your doctor about eating and drinking, which may include:  8 hours before the procedure - stop eating heavy meals or foods such as meat, fried foods, or fatty foods.  6 hours before the procedure - stop eating light meals or foods, such as toast or cereal.  6 hours before the  procedure - stop drinking milk or drinks that contain milk.  3 hours before the procedure - stop drinking clear liquids. General instructions  You will need to take out any dentures or retainers.  Plan to have someone take you home from the hospital or clinic.  If you will be going home right after the procedure, plan to have someone with you for 24 hours.  Ask your doctor about: ? Changing or stopping your normal medicines. This is important if you take diabetes medicines or blood thinners. ? Taking over-the-counter medicines, vitamins, herbs, and supplements. ? Taking medicines such as aspirin and ibuprofen. These medicines can thin your blood. Do not take these medicines unless your doctor tells you to take them. What happens during the procedure?  To lower your risk of infection, your doctors will wash or clean their hands.  An IV will be put into one of your veins.  You will be given a medicine to help you relax (sedative).  A medicine may be sprayed or gargled. This numbs the back of your throat.  Your blood pressure, heart  rate, and breathing will be watched.  You may be asked to lay on your left side.  A bite block will be placed in your mouth. This keeps you from biting the tube.  The tip of the TEE probe will be placed into the back of your mouth.  You will be asked to swallow.  Your doctor will take pictures of your heart.  The probe and bite block will be taken out. The procedure may vary among doctors and hospitals. What happens after the procedure?   Your blood pressure, heart rate, breathing rate, and blood oxygen level will be watched until the medicines you were given have worn off.  When you first wake up, your throat may feel sore and numb. This will get better over time. You will not be allowed to eat or drink until the numbness has gone away.  Do not drive for 24 hours if you were given a medicine to help you relax. Summary  TEE is a test that  uses sound waves to take pictures of your heart.  You will be given a medicine to help you relax.  Do not drive for 24 hours if you were given a medicine to help you relax. This information is not intended to replace advice given to you by your health care provider. Make sure you discuss any questions you have with your health care provider. Document Released: 06/06/2009 Document Revised: 04/28/2018 Document Reviewed: 11/10/2016 Elsevier Patient Education  2020 Elsevier Inc.  Coronary Angiogram With Stent Coronary angiogram with stent placement is a procedure to widen or open a narrow blood vessel of the heart (coronary artery). Arteries may become blocked by cholesterol buildup (plaques) in the lining of the wall. When a coronary artery becomes partially blocked, blood flow to that area decreases. This may lead to chest pain or a heart attack (myocardial infarction). A stent is a small piece of metal that looks like mesh or a spring. Stent placement may be done as treatment for a heart attack or right after a coronary angiogram in which a blocked artery is found. Let your health care provider know about:  Any allergies you have.  All medicines you are taking, including vitamins, herbs, eye drops, creams, and over-the-counter medicines.  Any problems you or family members have had with anesthetic medicines.  Any blood disorders you have.  Any surgeries you have had.  Any medical conditions you have.  Whether you are pregnant or may be pregnant. What are the risks? Generally, this is a safe procedure. However, problems may occur, including:  Damage to the heart or its blood vessels.  A return of blockage.  Bleeding, infection, or bruising at the insertion site.  A collection of blood under the skin (hematoma) at the insertion site.  A blood clot in another part of the body.  Kidney injury.  Allergic reaction to the dye or contrast that is used.  Bleeding into the abdomen  (retroperitoneal bleeding). What happens before the procedure? Staying hydrated Follow instructions from your health care provider about hydration, which may include:  Up to 2 hours before the procedure - you may continue to drink clear liquids, such as water, clear fruit juice, black coffee, and plain tea.  Eating and drinking restrictions Follow instructions from your health care provider about eating and drinking, which may include:  8 hours before the procedure - stop eating heavy meals or foods such as meat, fried foods, or fatty foods.  6 hours before the procedure -  stop eating light meals or foods, such as toast or cereal.  2 hours before the procedure - stop drinking clear liquids. Ask your health care provider about:  Changing or stopping your regular medicines. This is especially important if you are taking diabetes medicines or blood thinners.  Taking medicines such as ibuprofen. These medicines can thin your blood. Do not take these medicines before your procedure if your health care provider instructs you not to. Generally, aspirin is recommended before a procedure of passing a small, thin tube (catheter) through a blood vessel and into the heart (cardiac catheterization). What happens during the procedure?   An IV tube will be inserted into one of your veins.  You will be given one or more of the following: ? A medicine to help you relax (sedative). ? A medicine to numb the area where the catheter will be inserted into an artery (local anesthetic).  To reduce your risk of infection: ? Your health care team will wash or sanitize their hands. ? Your skin will be washed with soap. ? Hair may be removed from the area where the catheter will be inserted.  Using a guide wire, the catheter will be inserted into an artery. The location may be in your groin, in your wrist, or in the fold of your arm (near your elbow).  A type of X-ray (fluoroscopy) will be used to help guide  the catheter to the opening of the arteries in the heart.  A dye will be injected into the catheter, and X-rays will be taken. The dye will help to show where any narrowing or blockages are located in the arteries.  A tiny wire will be guided to the blocked spot, and a balloon will be inflated to make the artery wider.  The stent will be expanded and will crush the plaques into the wall of the vessel. The stent will hold the area open and improve the blood flow. Most stents have a drug coating to reduce the risk of the stent narrowing over time.  The artery may be made wider using a drill, laser, or other tools to remove plaques.  When the blood flow is better, the catheter will be removed. The lining of the artery will grow over the stent, which stays where it was placed. This procedure may vary among health care providers and hospitals. What happens after the procedure?  If the procedure is done through the leg, you will be kept in bed lying flat for about 6 hours. You will be instructed to not bend and not cross your legs.  The insertion site will be checked frequently.  The pulse in your foot or wrist will be checked frequently.  You may have additional blood tests, X-rays, and a test that records the electrical activity of your heart (electrocardiogram, or ECG). This information is not intended to replace advice given to you by your health care provider. Make sure you discuss any questions you have with your health care provider. Document Released: 02/13/2003 Document Revised: 11/18/2017 Document Reviewed: 03/14/2016 Elsevier Patient Education  2020 [employer]

## 2019-08-08 NOTE — Progress Notes (Signed)
Follow-up Outpatient Visit Date: 08/08/2019  Primary Care Provider: Jodelle Green, FNP No address on file  Chief Complaint: Shortness of breath and chest pain  HPI:  Bonnie Ryan is a 63 y.o. female with history of mitral valve prolapse with mild mitral regurgitation, nonobstructive coronary artery disease by remote catheterization, hypertension, hyperlipidemia, and type 2 diabetes mellitus, who presents for follow-up of mitral valve disease.  I met her during a virtual visit in late March, which time she reported significant decline in functional capacity due to exertional dyspnea over the preceding 8 months.  We agreed to increase furosemide to 40 mg daily and obtain an echocardiogram with close office follow-up.  Unfortunately, she canceled her follow-up visits in May.  Echocardiogram performed last week was technically challenging but showed mitral valve prolapse with at least moderate regurgitation.  LVEF was normal.  Today, Bonnie Ryan reports that she has continued to have progressive exertional dyspnea and fatigue.  She is also getting chest tightness almost every day, most pronounced when she lies down.  She has a hard time lying flat due to chest discomfort and shortness of breath.  She typically sleeps on 4 pillows and has also experienced intermittent orthopnea.  She has periodic leg edema and is currently self titrating furosemide (taking 20 or 40 mg daily based on her symptoms and weight).  She reports frequent orthostatic lightheadedness but no syncope.  She is concerned that her mitral valve prolapse and regurgitation are quickly progressing.  --------------------------------------------------------------------------------------------------  Past Medical History:  Diagnosis Date  . CAD (coronary artery disease)    Nonbstructive CAD by cath 09/2006 30% LAD, 25% RCA. Normal EF of 55-60% by echo 10/2009 with normal wall thickness.; ETT-Myoview 07/28/11: Normal without ischemia, EF  75%. ; LHC 12/12:   LM calcified, mod LAD calcification, mid LAD 30-40%, prox RCA 30-40%, EF 55-65%, 2+ MR  . Diabetes mellitus    Previously diet-controlled  . Endometriosis   . Hypercholesteremia   . Hypertension   . Mitral valve prolapse    moderate by echo 10/2009 with mild MR;  Echocardiogram 07/23/11: Normal LV wall thickness, EF 60%, normal wall motion, moderately severe bileaflet mitral valve prolapse with mild-moderate MR.  This was not felt to be significantly different from prior echo;  [place] 12/12: RA 2, RV 19/2, PA 18/6 with a mean of 12, PCWP 4, LV 103/57 with a mean of 76, AO 103/10;  CO 3.7, CI 1.9, O2 - PA 67%, Ao 97%  . UTI (urinary tract infection)    Past Surgical History:  Procedure Laterality Date  . ABDOMINAL HYSTERECTOMY     tah/bso  . APPENDECTOMY    . CHOLECYSTECTOMY    . KNEE SURGERY    . LAPAROSCOPY    . US ECHOCARDIOGRAPHY     EF 55-60%    Current Meds  Medication Sig  . furosemide (LASIX) 20 MG tablet Take 1 tablet (20 mg total) by mouth daily.  Marland Kitchen losartan (COZAAR) 25 MG tablet Take 1 tablet (25 mg total) by mouth daily.  . metFORMIN (GLUCOPHAGE) 500 MG tablet Take 1 tablet (500 mg total) by mouth 2 (two) times daily with a meal.  . metoprolol succinate (TOPROL-XL) 50 MG 24 hr tablet TAKE 1 TABLET (50 MG TOTAL) BY MOUTH DAILY. TAKE WITH OR IMMEDIATELY FOLLOWING A MEAL.  . rosuvastatin (CRESTOR) 20 MG tablet Take 1 tablet (20 mg total) by mouth daily.  Marland Kitchen VICTOZA 18 MG/3ML SOPN Inject 0.3 mLs (1.8 mg total) into the  skin daily.  Marland Kitchen zolpidem (AMBIEN CR) 6.25 MG CR tablet Take 1 tablet (6.25 mg total) by mouth at bedtime as needed for sleep.    Allergies: Penicillins  Social History   Tobacco Use  . Smoking status: Current Every Day Smoker    Packs/day: 0.25    Years: 20.00    Pack years: 5.00    Types: Cigarettes    Last attempt to quit: 07/24/2015    Years since quitting: 4.0  . Smokeless tobacco: Never Used  Substance Use Topics  . Alcohol use:  Yes    Alcohol/week: 5.0 standard drinks    Types: 5 Standard drinks or equivalent per week  . Drug use: No    Family History  Problem Relation Age of Onset  . Diabetes Mother   . Breast cancer Mother 67  . Hypertension Father   . Ovarian cancer Neg Hx   . Colon cancer Neg Hx   . Heart disease Neg Hx     Review of Systems: A 12-system review of systems was performed and was negative except as noted in the HPI.  --------------------------------------------------------------------------------------------------  Physical Exam: BP 120/80 (BP Location: Left Arm, Patient Position: Sitting, Cuff Size: Normal)   Pulse 79   Ht '5\' 7"'  (1.702 m)   Wt 187 lb 8 oz (85 kg)   SpO2 98%   BMI 29.37 kg/m   General: NAD. HEENT: No conjunctival pallor or scleral icterus. Facemask in place. Neck: Supple without lymphadenopathy, thyromegaly, JVD, or HJR. Lungs: Normal work of breathing. Clear to auscultation bilaterally without wheezes or crackles. Heart: Regular rate and rhythm with 2/6 holosystolic murmur heard loudest at the left upper sternal border.  No rubs or gallops. Abd: Bowel sounds present. Soft, NT/ND without hepatosplenomegaly Ext: No lower extremity edema.  2+ radial pulses bilaterally. Skin: Warm and dry without rash.  EKG: Normal sinus rhythm with nonspecific ST/T changes and mild QT prolongation.  QT slightly longer compared with prior tracing from 10/19/2016.  Inferior ST/T changes less pronounced on today's tracing.  Lab Results  Component Value Date   WBC 6.1 09/01/2018   HGB 12.2 09/01/2018   HCT 35.9 09/01/2018   MCV 97.0 09/01/2018   PLT 175 09/01/2018    Lab Results  Component Value Date   NA 140 09/01/2018   K 3.8 09/01/2018   CL 100 09/01/2018   CO2 25 09/01/2018   BUN 29 (H) 09/01/2018   CREATININE 0.94 09/01/2018   GLUCOSE 186 (H) 09/01/2018   ALT 33 (H) 09/01/2018    Lab Results  Component Value Date   CHOL 265 (H) 09/01/2018   HDL 54 09/01/2018     [place]  09/01/2018     Comment:     . LDL cholesterol not calculated. Triglyceride levels greater than 400 mg/dL invalidate calculated LDL results. . Reference range: <100 . Desirable range <100 mg/dL for primary prevention;   <70 mg/dL for patients with CHD or diabetic patients  with > or = 2 CHD risk factors. Marland Kitchen LDL-C is now calculated using the Martin-Hopkins  calculation, which is a validated novel method providing  better accuracy than the Friedewald equation in the  estimation of LDL-C.  Cresenciano Genre et al. Annamaria Helling. 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  MR is somewhat difficult to assess but is at least moderate if not severe.  Bonnie Ryan appears euvolemic on exam today, though she reports NYHA class III-IV symptoms.  We have discussed the natural history of mitral valve prolapse and mitral regurgitation; based on recent echocardiogram findings and progressing symptoms, I have recommended that we proceed with right and left heart catheterization and transesophageal echocardiogram for further assessment.  I have discussed the risks and benefits of both procedures; Bonnie Ryan is agreeable to proceed.  I will also place a referral to cardiac surgery to discuss mitral valve intervention following aforementioned TEE and catheterization.  In the meantime, I will have  Bonnie Ryan begin aspirin 81 mg daily.  No other medication changes at this time.  Hypertension: Blood pressure is normal today.  Given orthostatic lightheadedness, I will defer medication changes today.  Follow-up: Return to clinic in 3 to 4 weeks.  Nelva Bush, MD 08/08/2019 1:16 PM

## 2019-08-08 NOTE — H&P (View-Only) (Signed)
Follow-up Outpatient Visit Date: 08/08/2019  Primary Care Provider: Jodelle Green, FNP No address on file  Chief Complaint: Shortness of breath and chest pain  HPI:  Bonnie Ryan is a 63 y.o. female with history of mitral valve prolapse with mild mitral regurgitation, nonobstructive coronary artery disease by remote catheterization, hypertension, hyperlipidemia, and type 2 diabetes mellitus, who presents for follow-up of mitral valve disease.  I met her during a virtual visit in late March, which time she reported significant decline in functional capacity due to exertional dyspnea over the preceding 8 months.  We agreed to increase furosemide to 40 mg daily and obtain an echocardiogram with close office follow-up.  Unfortunately, she canceled her follow-up visits in May.  Echocardiogram performed last week was technically challenging but showed mitral valve prolapse with at least moderate regurgitation.  LVEF was normal.  Today, Bonnie Ryan reports that she has continued to have progressive exertional dyspnea and fatigue.  She is also getting chest tightness almost every day, most pronounced when she lies down.  She has a hard time lying flat due to chest discomfort and shortness of breath.  She typically sleeps on 4 pillows and has also experienced intermittent orthopnea.  She has periodic leg edema and is currently self titrating furosemide (taking 20 or 40 mg daily based on her symptoms and weight).  She reports frequent orthostatic lightheadedness but no syncope.  She is concerned that her mitral valve prolapse and regurgitation are quickly progressing.  --------------------------------------------------------------------------------------------------  Past Medical History:  Diagnosis Date  . CAD (coronary artery disease)    Nonbstructive CAD by cath 09/2006 30% LAD, 25% RCA. Normal EF of 55-60% by echo 10/2009 with normal wall thickness.; ETT-Myoview 07/28/11: Normal without ischemia, EF  75%. ; [facility] 12/12:   LM calcified, mod LAD calcification, mid LAD 30-40%, prox RCA 30-40%, EF 55-65%, 2+ MR  . Diabetes mellitus    Previously diet-controlled  . Endometriosis   . Hypercholesteremia   . Hypertension   . Mitral valve prolapse    moderate by echo 10/2009 with mild MR;  Echocardiogram 07/23/11: Normal LV wall thickness, EF 60%, normal wall motion, moderately severe bileaflet mitral valve prolapse with mild-moderate MR.  This was not felt to be significantly different from prior echo;  [place] 12/12: RA 2, RV 19/2, PA 18/6 with a mean of 12, PCWP 4, LV 103/57 with a mean of 76, AO 103/10;  CO 3.7, CI 1.9, O2 - PA 67%, Ao 97%  . UTI (urinary tract infection)    Past Surgical History:  Procedure Laterality Date  . ABDOMINAL HYSTERECTOMY     tah/bso  . APPENDECTOMY    . CHOLECYSTECTOMY    . KNEE SURGERY    . LAPAROSCOPY    . US ECHOCARDIOGRAPHY     EF 55-60%    Current Meds  Medication Sig  . furosemide (LASIX) 20 MG tablet Take 1 tablet (20 mg total) by mouth daily.  Marland Kitchen losartan (COZAAR) 25 MG tablet Take 1 tablet (25 mg total) by mouth daily.  . metFORMIN (GLUCOPHAGE) 500 MG tablet Take 1 tablet (500 mg total) by mouth 2 (two) times daily with a meal.  . metoprolol succinate (TOPROL-XL) 50 MG 24 hr tablet TAKE 1 TABLET (50 MG TOTAL) BY MOUTH DAILY. TAKE WITH OR IMMEDIATELY FOLLOWING A MEAL.  . rosuvastatin (CRESTOR) 20 MG tablet Take 1 tablet (20 mg total) by mouth daily.  Marland Kitchen VICTOZA 18 MG/3ML SOPN Inject 0.3 mLs (1.8 mg total) into the  skin daily.  Marland Kitchen zolpidem (AMBIEN CR) 6.25 MG CR tablet Take 1 tablet (6.25 mg total) by mouth at bedtime as needed for sleep.    Allergies: Penicillins  Social History   Tobacco Use  . Smoking status: Current Every Day Smoker    Packs/day: 0.25    Years: 20.00    Pack years: 5.00    Types: Cigarettes    Last attempt to quit: 07/24/2015    Years since quitting: 4.0  . Smokeless tobacco: Never Used  Substance Use Topics  . Alcohol use:  Yes    Alcohol/week: 5.0 standard drinks    Types: 5 Standard drinks or equivalent per week  . Drug use: No    Family History  Problem Relation Age of Onset  . Diabetes Mother   . Breast cancer Mother 52  . Hypertension Father   . Ovarian cancer Neg Hx   . Colon cancer Neg Hx   . Heart disease Neg Hx     Review of Systems: A 12-system review of systems was performed and was negative except as noted in the HPI.  --------------------------------------------------------------------------------------------------  Physical Exam: BP 120/80 (BP Location: Left Arm, Patient Position: Sitting, Cuff Size: Normal)   Pulse 79   Ht '5\' 7"'  (1.702 m)   Wt 187 lb 8 oz (85 kg)   SpO2 98%   BMI 29.37 kg/m   General: NAD. HEENT: No conjunctival pallor or scleral icterus. Facemask in place. Neck: Supple without lymphadenopathy, thyromegaly, JVD, or HJR. Lungs: Normal work of breathing. Clear to auscultation bilaterally without wheezes or crackles. Heart: Regular rate and rhythm with 2/6 holosystolic murmur heard loudest at the left upper sternal border.  No rubs or gallops. Abd: Bowel sounds present. Soft, NT/ND without hepatosplenomegaly Ext: No lower extremity edema.  2+ radial pulses bilaterally. Skin: Warm and dry without rash.  EKG: Normal sinus rhythm with nonspecific ST/T changes and mild QT prolongation.  QT slightly longer compared with prior tracing from 10/19/2016.  Inferior ST/T changes less pronounced on today's tracing.  Lab Results  Component Value Date   WBC 6.1 09/01/2018   HGB 12.2 09/01/2018   HCT 35.9 09/01/2018   MCV 97.0 09/01/2018   PLT 175 09/01/2018    Lab Results  Component Value Date   NA 140 09/01/2018   K 3.8 09/01/2018   CL 100 09/01/2018   CO2 25 09/01/2018   BUN 29 (H) 09/01/2018   CREATININE 0.94 09/01/2018   GLUCOSE 186 (H) 09/01/2018   ALT 33 (H) 09/01/2018    Lab Results  Component Value Date   CHOL 265 (H) 09/01/2018   HDL 54 09/01/2018     [place]  09/01/2018     Comment:     . LDL cholesterol not calculated. Triglyceride levels greater than 400 mg/dL invalidate calculated LDL results. . Reference range: <100 . Desirable range <100 mg/dL for primary prevention;   <70 mg/dL for patients with CHD or diabetic patients  with > or = 2 CHD risk factors. Marland Kitchen LDL-C is now calculated using the Martin-Hopkins  calculation, which is a validated novel method providing  better accuracy than the Friedewald equation in the  estimation of LDL-C.  Cresenciano Genre et al. Annamaria Helling. 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 Goulart's progressive dyspnea, orthopnea, and atypical chest pain.  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  MR is somewhat difficult to assess but is at least moderate if not severe.  Ms. Cousar appears euvolemic on exam today, though she reports NYHA class III-IV symptoms.  We have discussed the natural history of mitral valve prolapse and mitral regurgitation; based on recent echocardiogram findings and progressing symptoms, I have recommended that we proceed with right and left heart catheterization and transesophageal echocardiogram for further assessment.  I have discussed the risks and benefits of both procedures; Ms. Holsonback is agreeable to proceed.  I will also place a referral to cardiac surgery to discuss mitral valve intervention following aforementioned TEE and catheterization.  In the meantime, I will have  Ms. Farring begin aspirin 81 mg daily.  No other medication changes at this time.  Hypertension: Blood pressure is normal today.  Given orthostatic lightheadedness, I will defer medication changes today.  Follow-up: Return to clinic in 3 to 4 weeks.  Nelva Bush, MD 08/08/2019 1:16 PM

## 2019-08-09 ENCOUNTER — Encounter (HOSPITAL_COMMUNITY): Payer: Self-pay | Admitting: Cardiovascular Disease

## 2019-08-09 ENCOUNTER — Encounter (HOSPITAL_COMMUNITY)
Admission: RE | Disposition: A | Discharge status: Home / Self Care | Payer: Managed Care, Other (non HMO) | Source: Home / Self Care | Attending: Cardiovascular Disease

## 2019-08-09 ENCOUNTER — Ambulatory Visit (HOSPITAL_COMMUNITY)
Admission: RE | Admit: 2019-08-09 | Discharge: 2019-08-09 | Disposition: A | Discharge status: Home / Self Care | Payer: Managed Care, Other (non HMO) | Attending: Cardiovascular Disease | Admitting: Cardiovascular Disease

## 2019-08-09 ENCOUNTER — Other Ambulatory Visit: Payer: Self-pay | Admitting: Internal Medicine

## 2019-08-09 ENCOUNTER — Encounter (HOSPITAL_COMMUNITY)
Admission: RE | Disposition: A | Discharge status: Home / Self Care | Payer: Self-pay | Source: Home / Self Care | Attending: Cardiovascular Disease

## 2019-08-09 ENCOUNTER — Ambulatory Visit (HOSPITAL_BASED_OUTPATIENT_CLINIC_OR_DEPARTMENT_OTHER)
Admission: RE | Admit: 2019-08-09 | Discharge: 2019-08-09 | Disposition: A | Discharge status: Home / Self Care | Payer: Managed Care, Other (non HMO) | Source: Ambulatory Visit | Attending: Internal Medicine | Admitting: Internal Medicine

## 2019-08-09 ENCOUNTER — Other Ambulatory Visit: Payer: Self-pay

## 2019-08-09 DIAGNOSIS — I5033 Acute on chronic diastolic (congestive) heart failure: Secondary | ICD-10-CM

## 2019-08-09 DIAGNOSIS — Z79899 Other long term (current) drug therapy: Secondary | ICD-10-CM | POA: Diagnosis not present

## 2019-08-09 DIAGNOSIS — I341 Nonrheumatic mitral (valve) prolapse: Secondary | ICD-10-CM | POA: Diagnosis not present

## 2019-08-09 DIAGNOSIS — I2510 Atherosclerotic heart disease of native coronary artery without angina pectoris: Secondary | ICD-10-CM | POA: Insufficient documentation

## 2019-08-09 DIAGNOSIS — I10 Essential (primary) hypertension: Secondary | ICD-10-CM | POA: Diagnosis not present

## 2019-08-09 DIAGNOSIS — I340 Nonrheumatic mitral (valve) insufficiency: Secondary | ICD-10-CM | POA: Diagnosis not present

## 2019-08-09 DIAGNOSIS — Z87891 Personal history of nicotine dependence: Secondary | ICD-10-CM | POA: Diagnosis not present

## 2019-08-09 DIAGNOSIS — Z7984 Long term (current) use of oral hypoglycemic drugs: Secondary | ICD-10-CM | POA: Insufficient documentation

## 2019-08-09 DIAGNOSIS — Z880 Allergy status to penicillin: Secondary | ICD-10-CM | POA: Diagnosis not present

## 2019-08-09 DIAGNOSIS — E119 Type 2 diabetes mellitus without complications: Secondary | ICD-10-CM | POA: Diagnosis not present

## 2019-08-09 DIAGNOSIS — I38 Endocarditis, valve unspecified: Secondary | ICD-10-CM

## 2019-08-09 DIAGNOSIS — E7800 Pure hypercholesterolemia, unspecified: Secondary | ICD-10-CM | POA: Diagnosis not present

## 2019-08-09 DIAGNOSIS — E876 Hypokalemia: Secondary | ICD-10-CM

## 2019-08-09 HISTORY — PX: BUBBLE STUDY: SHX6837

## 2019-08-09 HISTORY — PX: TEE WITHOUT CARDIOVERSION: SHX5443

## 2019-08-09 HISTORY — DX: Family history of other specified conditions: Z84.89

## 2019-08-09 HISTORY — PX: RIGHT/LEFT HEART CATH AND CORONARY ANGIOGRAPHY: CATH118266

## 2019-08-09 LAB — POCT I-STAT EG7
Acid-Base Excess: 7 mmol/L — ABNORMAL HIGH (ref 0.0–2.0)
Bicarbonate: 32.3 mmol/L — ABNORMAL HIGH (ref 20.0–28.0)
Calcium, Ion: 1.16 mmol/L (ref 1.15–1.40)
HCT: 32 % — ABNORMAL LOW (ref 36.0–46.0)
Hemoglobin: 10.9 g/dL — ABNORMAL LOW (ref 12.0–15.0)
O2 Saturation: 72 %
Potassium: 2.7 mmol/L — CL (ref 3.5–5.1)
Sodium: 139 mmol/L (ref 135–145)
TCO2: 34 mmol/L — ABNORMAL HIGH (ref 22–32)
pCO2, Ven: 47.4 mmHg (ref 44.0–60.0)
pH, Ven: 7.441 — ABNORMAL HIGH (ref 7.250–7.430)
pO2, Ven: 37 mmHg (ref 32.0–45.0)

## 2019-08-09 LAB — POCT I-STAT, CHEM 8
BUN: 15 mg/dL (ref 8–23)
BUN: 17 mg/dL (ref 8–23)
Calcium, Ion: 1.06 mmol/L — ABNORMAL LOW (ref 1.15–1.40)
Calcium, Ion: 1.14 mmol/L — ABNORMAL LOW (ref 1.15–1.40)
Chloride: 94 mmol/L — ABNORMAL LOW (ref 98–111)
Chloride: 94 mmol/L — ABNORMAL LOW (ref 98–111)
Creatinine, Ser: 0.9 mg/dL (ref 0.44–1.00)
Creatinine, Ser: 1 mg/dL (ref 0.44–1.00)
Glucose, Bld: 150 mg/dL — ABNORMAL HIGH (ref 70–99)
Glucose, Bld: 154 mg/dL — ABNORMAL HIGH (ref 70–99)
HCT: 34 % — ABNORMAL LOW (ref 36.0–46.0)
HCT: 35 % — ABNORMAL LOW (ref 36.0–46.0)
Hemoglobin: 11.6 g/dL — ABNORMAL LOW (ref 12.0–15.0)
Hemoglobin: 11.9 g/dL — ABNORMAL LOW (ref 12.0–15.0)
Potassium: 2.5 mmol/L — CL (ref 3.5–5.1)
Potassium: 3.2 mmol/L — ABNORMAL LOW (ref 3.5–5.1)
Sodium: 139 mmol/L (ref 135–145)
Sodium: 139 mmol/L (ref 135–145)
TCO2: 31 mmol/L (ref 22–32)
TCO2: 33 mmol/L — ABNORMAL HIGH (ref 22–32)

## 2019-08-09 LAB — POCT I-STAT 7, (LYTES, BLD GAS, ICA,H+H)
Acid-Base Excess: 7 mmol/L — ABNORMAL HIGH (ref 0.0–2.0)
Bicarbonate: 30.7 mmol/L — ABNORMAL HIGH (ref 20.0–28.0)
Calcium, Ion: 1.13 mmol/L — ABNORMAL LOW (ref 1.15–1.40)
HCT: 32 % — ABNORMAL LOW (ref 36.0–46.0)
Hemoglobin: 10.9 g/dL — ABNORMAL LOW (ref 12.0–15.0)
O2 Saturation: 99 %
Potassium: 2.7 mmol/L — CL (ref 3.5–5.1)
Sodium: 139 mmol/L (ref 135–145)
TCO2: 32 mmol/L (ref 22–32)
pCO2 arterial: 39.6 mmHg (ref 32.0–48.0)
pH, Arterial: 7.497 — ABNORMAL HIGH (ref 7.350–7.450)
pO2, Arterial: 114 mmHg — ABNORMAL HIGH (ref 83.0–108.0)

## 2019-08-09 LAB — CBC WITH DIFFERENTIAL/PLATELET
Basophils Absolute: 0 10*3/uL (ref 0.0–0.2)
Basos: 1 %
EOS (ABSOLUTE): 0.2 10*3/uL (ref 0.0–0.4)
Eos: 4 %
Hematocrit: 38.9 % (ref 34.0–46.6)
Hemoglobin: 13.5 g/dL (ref 11.1–15.9)
Immature Grans (Abs): 0 10*3/uL (ref 0.0–0.1)
Immature Granulocytes: 0 %
Lymphocytes Absolute: 2.2 10*3/uL (ref 0.7–3.1)
Lymphs: 38 %
MCH: 31.4 pg (ref 26.6–33.0)
MCHC: 34.7 g/dL (ref 31.5–35.7)
MCV: 91 fL (ref 79–97)
Monocytes Absolute: 0.4 10*3/uL (ref 0.1–0.9)
Monocytes: 8 %
Neutrophils Absolute: 2.8 10*3/uL (ref 1.4–7.0)
Neutrophils: 49 %
Platelets: 182 10*3/uL (ref 150–450)
RBC: 4.3 x10E6/uL (ref 3.77–5.28)
RDW: 12.7 % (ref 11.7–15.4)
WBC: 5.7 10*3/uL (ref 3.4–10.8)

## 2019-08-09 LAB — BASIC METABOLIC PANEL
BUN/Creatinine Ratio: 13 (ref 12–28)
BUN: 11 mg/dL (ref 8–27)
CO2: 27 mmol/L (ref 20–29)
Calcium: 10 mg/dL (ref 8.7–10.3)
Chloride: 95 mmol/L — ABNORMAL LOW (ref 96–106)
Creatinine, Ser: 0.83 mg/dL (ref 0.57–1.00)
GFR calc Af Amer: 87 mL/min/{1.73_m2} (ref >59)
GFR calc non Af Amer: 75 mL/min/{1.73_m2} (ref >59)
Glucose: 137 mg/dL — ABNORMAL HIGH (ref 65–99)
Potassium: 3.1 mmol/L — ABNORMAL LOW (ref 3.5–5.2)
Sodium: 141 mmol/L (ref 134–144)

## 2019-08-09 LAB — GLUCOSE, CAPILLARY: Glucose-Capillary: 158 mg/dL — ABNORMAL HIGH (ref 70–99)

## 2019-08-09 SURGERY — RIGHT/LEFT HEART CATH AND CORONARY ANGIOGRAPHY
Anesthesia: LOCAL

## 2019-08-09 SURGERY — ECHOCARDIOGRAM, TRANSESOPHAGEAL
Anesthesia: Moderate Sedation

## 2019-08-09 MED ORDER — FUROSEMIDE 20 MG PO TABS: 20.0000 mg | ORAL_TABLET | Freq: Every day | ORAL | 3 refills | Status: DC | PRN

## 2019-08-09 MED ORDER — ASPIRIN 81 MG PO CHEW: 81.0000 mg | CHEWABLE_TABLET | ORAL | Status: DC

## 2019-08-09 MED ORDER — HEPARIN (PORCINE) IN NACL 1000-0.9 UT/500ML-% IV SOLN
INTRAVENOUS | Status: AC
  Filled 2019-08-09: qty 500

## 2019-08-09 MED ORDER — FENTANYL CITRATE (PF) 100 MCG/2ML IJ SOLN
INTRAMUSCULAR | Status: AC
  Filled 2019-08-09: qty 2

## 2019-08-09 MED ORDER — SODIUM CHLORIDE 0.9 % IV SOLN: INTRAVENOUS | Status: DC

## 2019-08-09 MED ORDER — METFORMIN HCL 500 MG PO TABS: 500.0000 mg | ORAL_TABLET | Freq: Two times a day (BID) | ORAL | 3 refills | Status: DC

## 2019-08-09 MED ORDER — HEPARIN SODIUM (PORCINE) 1000 UNIT/ML IJ SOLN
INTRAMUSCULAR | Status: DC | PRN
  Administered 2019-08-09: 4000 [IU] via INTRAVENOUS

## 2019-08-09 MED ORDER — HEPARIN SODIUM (PORCINE) 1000 UNIT/ML IJ SOLN
INTRAMUSCULAR | Status: AC
  Filled 2019-08-09: qty 1

## 2019-08-09 MED ORDER — IOHEXOL 350 MG/ML SOLN
INTRAVENOUS | Status: DC | PRN
  Administered 2019-08-09: 35 mL

## 2019-08-09 MED ORDER — ASPIRIN EC 81 MG PO TBEC: 81.0000 mg | DELAYED_RELEASE_TABLET | Freq: Every day | ORAL | Status: DC

## 2019-08-09 MED ORDER — FENTANYL CITRATE (PF) 100 MCG/2ML IJ SOLN
INTRAMUSCULAR | Status: DC | PRN
  Administered 2019-08-09 (×4): 25 ug via INTRAVENOUS

## 2019-08-09 MED ORDER — SODIUM CHLORIDE 0.9% FLUSH: 3.0000 mL | INTRAVENOUS | Status: DC | PRN

## 2019-08-09 MED ORDER — FENTANYL CITRATE (PF) 100 MCG/2ML IJ SOLN
INTRAMUSCULAR | Status: DC | PRN
  Administered 2019-08-09: 25 ug via INTRAVENOUS

## 2019-08-09 MED ORDER — MIDAZOLAM HCL 2 MG/2ML IJ SOLN
INTRAMUSCULAR | Status: AC
  Filled 2019-08-09: qty 2

## 2019-08-09 MED ORDER — ACETAMINOPHEN 325 MG PO TABS: 650.0000 mg | ORAL_TABLET | ORAL | Status: DC | PRN

## 2019-08-09 MED ORDER — VERAPAMIL HCL 2.5 MG/ML IV SOLN
INTRAVENOUS | Status: AC
  Filled 2019-08-09: qty 2

## 2019-08-09 MED ORDER — POTASSIUM CHLORIDE CRYS ER 20 MEQ PO TBCR
40.0000 meq | EXTENDED_RELEASE_TABLET | Freq: Once | ORAL | Status: AC
  Administered 2019-08-09: 40 meq via ORAL
  Filled 2019-08-09: qty 2

## 2019-08-09 MED ORDER — DIPHENHYDRAMINE HCL 50 MG/ML IJ SOLN
INTRAMUSCULAR | Status: AC
  Filled 2019-08-09: qty 1

## 2019-08-09 MED ORDER — LIDOCAINE HCL (PF) 1 % IJ SOLN
INTRAMUSCULAR | Status: AC
  Filled 2019-08-09: qty 30

## 2019-08-09 MED ORDER — HEPARIN (PORCINE) IN NACL 1000-0.9 UT/500ML-% IV SOLN
INTRAVENOUS | Status: DC | PRN
  Administered 2019-08-09: 500 mL

## 2019-08-09 MED ORDER — SODIUM CHLORIDE 0.9 % IV SOLN: 250.0000 mL | INTRAVENOUS | Status: DC | PRN

## 2019-08-09 MED ORDER — SODIUM CHLORIDE 0.9% FLUSH: 3.0000 mL | Freq: Two times a day (BID) | INTRAVENOUS | Status: DC

## 2019-08-09 MED ORDER — SODIUM CHLORIDE 0.9 % IV SOLN
INTRAVENOUS | Status: AC | PRN
  Administered 2019-08-09: 84 mL/kg/h via INTRAVENOUS

## 2019-08-09 MED ORDER — MIDAZOLAM HCL 2 MG/2ML IJ SOLN
INTRAMUSCULAR | Status: DC | PRN
  Administered 2019-08-09: 1 mg via INTRAVENOUS

## 2019-08-09 MED ORDER — VERAPAMIL HCL 2.5 MG/ML IV SOLN
INTRAVENOUS | Status: DC | PRN
  Administered 2019-08-09: 10 mL via INTRA_ARTERIAL

## 2019-08-09 MED ORDER — ONDANSETRON HCL 4 MG/2ML IJ SOLN: 4.0000 mg | Freq: Four times a day (QID) | INTRAMUSCULAR | Status: DC | PRN

## 2019-08-09 MED ORDER — POTASSIUM CHLORIDE CRYS ER 10 MEQ PO TBCR: 40.0000 meq | EXTENDED_RELEASE_TABLET | Freq: Once | ORAL | Status: DC

## 2019-08-09 MED ORDER — BUTAMBEN-TETRACAINE-BENZOCAINE 2-2-14 % EX AERO
INHALATION_SPRAY | CUTANEOUS | Status: DC | PRN
  Administered 2019-08-09: 2 via TOPICAL

## 2019-08-09 MED ORDER — HYDRALAZINE HCL 20 MG/ML IJ SOLN: 10.0000 mg | INTRAMUSCULAR | Status: DC | PRN

## 2019-08-09 MED ORDER — MIDAZOLAM HCL (PF) 5 MG/ML IJ SOLN
INTRAMUSCULAR | Status: AC
  Filled 2019-08-09: qty 2

## 2019-08-09 MED ORDER — LABETALOL HCL 5 MG/ML IV SOLN: 10.0000 mg | INTRAVENOUS | Status: DC | PRN

## 2019-08-09 MED ORDER — LIDOCAINE HCL (PF) 1 % IJ SOLN
INTRAMUSCULAR | Status: DC | PRN
  Administered 2019-08-09 (×2): 3 mL

## 2019-08-09 MED ORDER — MIDAZOLAM HCL (PF) 10 MG/2ML IJ SOLN
INTRAMUSCULAR | Status: DC | PRN
  Administered 2019-08-09 (×5): 2 mg via INTRAVENOUS

## 2019-08-09 SURGICAL SUPPLY — 12 items

## 2019-08-09 NOTE — Interval H&P Note (Signed)
History and Physical Interval Note:  08/09/2019 10:53 AM  Bonnie Ryan  has presented today for cardiac catheterization, with the diagnosis of mitral valve prolapse, mitral valve regurgitation.  The various methods of treatment have been discussed with the patient and family. After consideration of risks, benefits and other options for treatment, the patient has consented to  Procedure(s): RIGHT/LEFT HEART CATH AND CORONARY ANGIOGRAPHY (N/A) as a surgical intervention.  The patient's history has been reviewed, patient examined, no change in status, stable for surgery.  I have reviewed the patient's chart and labs.  Questions were answered to the patient's satisfaction.    Cath Lab Visit (complete for each Cath Lab visit)  Clinical Evaluation Leading to the Procedure:   ACS: No.  Non-ACS:    Anginal/Heart FAilure Classification: NYHA IV  Anti-ischemic medical therapy: Minimal Therapy (1 class of medications)  Non-Invasive Test Results: No non-invasive testing performed  Prior CABG: No previous CABG  Eriberto Felch

## 2019-08-09 NOTE — Brief Op Note (Signed)
BRIEF CARDIAC CATHETERIZATION NOTE  08/09/2019  11:52 AM  PATIENT:  Bonnie Ryan  63 y.o. female  PRE-OPERATIVE DIAGNOSIS:  mitral valve prolapse, mitral valve regurgitation  POST-OPERATIVE DIAGNOSIS:  same  PROCEDURE:  Procedure(s): RIGHT/LEFT HEART CATH AND CORONARY ANGIOGRAPHY (N/A)  SURGEON:  Surgeon(s) and Role:    * Yuvia Plant, MD - Primary  FINDINGS: 1. Mild to moderate, non-obstructive coronary artery disease. 2. Normal left and right heart filling pressure. 3. Normal Fick cardiac output/index.  RECOMMENDATIONS: 1. Decrease furosemide to 20 mg daily as needed for edema/weight gain. 2. Proceed with cardiac surgery evaluation for mitral valve intervention. 3. Medical therapy and risk factor modification to prevent progression of coronary artery disease.  Nelva Bush, MD [facility]

## 2019-08-09 NOTE — Interval H&P Note (Signed)
History and Physical Interval Note:  08/09/2019 8:11 AM  [name]  has presented today for surgery, with the diagnosis of Mitral Valve prolapse.  The various methods of treatment have been discussed with the patient and family. After consideration of risks, benefits and other options for treatment, the patient has consented to  Procedure(s): TRANSESOPHAGEAL ECHOCARDIOGRAM (TEE) (N/A) as a surgical intervention.  The patient's history has been reviewed, patient examined, no change in status, stable for surgery.  I have reviewed the patient's chart and labs.  Questions were answered to the patient's satisfaction.    Evaluation for mitral valve prolapse. 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 MR appears to be late systolic. Will better characterize with TEE.   Lake Bells T. Audie Box, [place]  [address] [place], [place] [address] [phone]  8:12 AM

## 2019-08-09 NOTE — Discharge Instructions (Signed)
HOLD METFORMIN FOR A FULL 48 HOURS AFTER DISCHARGE.  DRINK PLENTY OF FLUIDS FOR THE NEXT 2-3 DAYS.  KEEP ARM ELEVATED THE REMAINDER OF THE DAY.  Radial Site Care  This sheet gives you information about how to care for yourself after your procedure. Your health care provider may also give you more specific instructions. If you have problems or questions, contact your health care provider. What can I expect after the procedure? After the procedure, it is common to have:  Bruising and tenderness at the catheter insertion area. Follow these instructions at home: Medicines  Take over-the-counter and prescription medicines only as told by your health care provider. Insertion site care  Follow instructions from your health care provider about how to take care of your insertion site. Make sure you: ? Wash your hands with soap and water before you change your bandage (dressing). If soap and water are not available, use hand sanitizer. ? Change your dressing as told by your health care provider.  Check your insertion site every day for signs of infection. Check for: ? Redness, swelling, or pain. ? Fluid or blood. ? Pus or a bad smell. ? Warmth.  Do not take baths, swim, or use a hot tub for 5 days.  You may shower 24-48 hours after the procedure. ? Remove the dressing and gently wash the site with plain soap and water. ? Pat the area dry with a clean towel. ? Do not rub the site. That could cause bleeding.  Do not apply powder or lotion to the site. Activity   For 24 hours after the procedure, or as directed by your health care provider: ? Do not flex or bend the affected arm. ? Do not push or pull heavy objects with the affected arm. ? Do not drive yourself home from the hospital or clinic. You may drive 24 hours after the procedure. ? Do not operate machinery or power tools.  Do not lift anything that is heavier than 10 lb for 5 days.  Ask your health care provider when it  is okay to: ? Return to work or school. ? Resume usual physical activities or sports. ? Resume sexual activity. General instructions  If the catheter site starts to bleed, raise your arm and put firm pressure on the site. If the bleeding does not stop, get help right away. This is a medical emergency.  If you went home on the same day as your procedure, a responsible adult should be with you for the first 24 hours after you arrive home.  Keep all follow-up visits as told by your health care provider. This is important. Contact a health care provider if:  You have a fever.  You have redness, swelling, or yellow drainage around your insertion site. Get help right away if:  You have unusual pain at the radial site.  The catheter insertion area swells very fast.  The insertion area is bleeding, and the bleeding does not stop when you hold steady pressure on the area.  Your arm or hand becomes pale, cool, tingly, or numb. These symptoms may represent a serious problem that is an emergency. Do not wait to see if the symptoms will go away. Get medical help right away. Call your local emergency services (911 in the [place] Do not drive yourself to the hospital. Summary  After the procedure, it is common to have bruising and tenderness at the site.  Follow instructions from your health care provider about how to take  care of your radial site wound. Check the wound every day for signs of infection.  Do not lift anything that is heavier than 10 lb for 5 days. This information is not intended to replace advice given to you by your health care provider. Make sure you discuss any questions you have with your health care provider. Document Released: 09/11/2010 Document Revised: 09/14/2017 Document Reviewed: 09/14/2017 Elsevier Patient Education  2020 [employer].

## 2019-08-09 NOTE — CV Procedure (Signed)
    TRANSESOPHAGEAL ECHOCARDIOGRAM   NAME:  Ryla Cauthon    MRN: [id number] DOB:  [birth year]    ADMIT DATE: 08/09/2019  INDICATIONS: MR  PROCEDURE:   Informed consent was obtained prior to the procedure. The risks, benefits and alternatives for the procedure were discussed and the patient comprehended these risks.  Risks include, but are not limited to, cough, sore throat, vomiting, nausea, somnolence, esophageal and stomach trauma or perforation, bleeding, low blood pressure, aspiration, pneumonia, infection, trauma to the teeth and death.    Procedural time out performed. The oropharynx was anesthetized with topical 1% benzocaine.    During this procedure the patient is administered a total of Versed 10 mg and Fentanyl 100 mcg to achieve and maintain moderate conscious sedation.  The patient's heart rate, blood pressure, and oxygen saturation are monitored continuously during the procedure. The period of conscious sedation is 38 minutes, of which I was present face-to-face 100% of this time.   The transesophageal probe was inserted in the esophagus and stomach without difficulty and multiple views were obtained.   COMPLICATIONS:    There were no immediate complications.  KEY FINDINGS: 1. 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  2. Severe mitral regurgitation with multiple regurgitant jets.  3. LVEF ~60%.  4. Full report to follow.   Lake Bells T. Audie Box, [place]  [address] [place], [place] [address] [phone]  9:50 AM

## 2019-08-09 NOTE — Progress Notes (Signed)
  Echocardiogram 2D Echocardiogram and Echocardiogram Transesophageal has been performed.  Matilde Bash 08/09/2019, 9:48 AM

## 2019-08-21 ENCOUNTER — Encounter: Payer: Self-pay | Admitting: Thoracic Surgery (Cardiothoracic Vascular Surgery)

## 2019-08-21 ENCOUNTER — Other Ambulatory Visit: Payer: Self-pay | Admitting: Thoracic Surgery (Cardiothoracic Vascular Surgery)

## 2019-08-21 ENCOUNTER — Institutional Professional Consult (permissible substitution) (INDEPENDENT_AMBULATORY_CARE_PROVIDER_SITE_OTHER): Payer: Managed Care, Other (non HMO) | Admitting: Thoracic Surgery (Cardiothoracic Vascular Surgery)

## 2019-08-21 ENCOUNTER — Other Ambulatory Visit: Payer: Self-pay

## 2019-08-21 VITALS — BP 146/88 | HR 80 | Temp 97.8°F | Resp 20 | Ht 67.0 in | Wt 185.0 lb

## 2019-08-21 DIAGNOSIS — I340 Nonrheumatic mitral (valve) insufficiency: Secondary | ICD-10-CM

## 2019-08-21 DIAGNOSIS — I341 Nonrheumatic mitral (valve) prolapse: Secondary | ICD-10-CM | POA: Diagnosis not present

## 2019-08-21 NOTE — H&P (View-Only) (Signed)
[address]       Bonnie Ryan [address]             [phone]     CARDIOTHORACIC SURGERY CONSULTATION REPORT  Referring Provider is Ryan, Bonnie Deer, MD PCP is Ryan, Bonnie Arch, FNP  Chief Complaint  Patient presents with  . Mitral Regurgitation    Surgical eval, ECHO 08/03/19, Cardiac Cath and TEE 08/09/19  . Mitral Valve Prolapse    HPI:  Patient is a 63 year old moderately obese female with history of mitral valve prolapse and mitral regurgitation, nonobstructive coronary artery disease, hypertension, hypercholesterolemia, type 2 diabetes mellitus, polycystic ovarian disease and endometriosis who has been referred for surgical consultation to discuss treatment options for management of severe symptomatic primary mitral regurgitation.  Patient states that she was first found to have mitral valve prolapse as a teenager when she presented following a syncopal event.  She has been taking beta-blockers for many years because of symptoms of palpitations.  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  She has been followed intermittently all of her life, most recently by Dr. Okey Ryan.  Over the past year the patient has developed significant decline in functional capacity with worsening exertional shortness of breath and increasing fatigue.  Recent follow-up transthoracic echocardiogram demonstrated significant progression of disease with at least moderate mitral regurgitation but preserved left ventricular systolic function.  Transesophageal echocardiogram was performed August 09, 2019 and confirmed the presence of severe mitral regurgitation.  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  Although there were no obvious ruptured primary chordae tendinae or flail segments there were 3 broad  jets directed centrally throughout the left atrium with systolic flow reversal in the left upper pulmonary vein and regurgitant volume estimated between 64 and 111 mL, all consistent with severe mitral regurgitation.  Left ventricular function remain normal with ejection fraction calculated 62%.  There was moderate left atrial enlargement.  No other significant abnormalities were noted.  Diagnostic cardiac catheterization was performed revealing mild to moderate nonobstructive coronary artery disease with normal left and right heart filling pressures and normal cardiac output.  Cardiothoracic surgical consultation was requested.  Patient is single and lives locally in [place] with her mother.  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  The patient remains reasonably active physically and entirely functionally independent.  However, she complains of gradual progressive decline in her exercise tolerance over the last several years, most notably over the past year.  She states that she gets short of breath with moderate and occasional low level activity.  She occasionally wakes up from her sleep at night and she cannot lay flat in bed, typically sleeping on 4 pillows.  She has some occasional tightness across her chest that is vague and not necessarily related to exertion.  She has occasional palpitations and mild dizzy spells without any history of prolonged tachypalpitations or syncope.  Past Medical History:  Diagnosis Date  . CAD (coronary artery disease)    Nonbstructive CAD by cath 09/2006 30% LAD, 25% RCA. Normal EF of 55-60% by echo 10/2009 with normal wall thickness.; ETT-Myoview 07/28/11: Normal without ischemia, EF 75%. ; LHC 12/12:   LM calcified, mod LAD calcification, mid LAD 30-40%, prox RCA 30-40%, EF 55-65%, 2+ MR  . Diabetes mellitus    Previously  diet-controlled  . Endometriosis   . Family history of adverse reaction to anesthesia     mother used to have vomiting after   . Hypercholesteremia   . Hypertension   . Mitral valve prolapse    moderate by echo 10/2009 with mild MR;  Echocardiogram 07/23/11: Normal LV wall thickness, EF 60%, normal wall motion, moderately severe bileaflet mitral valve prolapse with mild-moderate MR.  This was not felt to be significantly different from prior echo;  RHC 12/12: RA 2, RV 19/2, PA 18/6 with a mean of 12, PCWP 4, LV 103/57 with a mean of 76, AO 103/10;  CO 3.7, CI 1.9, O2 - PA 67%, Ao 97%  . UTI (urinary tract infection)     Past Surgical History:  Procedure Laterality Date  . ABDOMINAL HYSTERECTOMY     tah/bso  . APPENDECTOMY    . BUBBLE STUDY  08/09/2019   Procedure: BUBBLE STUDY;  Surgeon: Bonnie Rives, MD;  Location: [facility] ENDOSCOPY;  Service: Cardiology;;  . CHOLECYSTECTOMY    . KNEE SURGERY    . LAPAROSCOPY    . RIGHT/LEFT HEART CATH AND CORONARY ANGIOGRAPHY N/A 08/09/2019   Procedure: RIGHT/LEFT HEART CATH AND CORONARY ANGIOGRAPHY;  Surgeon: Bonnie Kendall, MD;  Location: [facility] INVASIVE CV LAB;  Service: Cardiovascular;  Laterality: N/A;  . TEE WITHOUT CARDIOVERSION N/A 08/09/2019   Procedure: TRANSESOPHAGEAL ECHOCARDIOGRAM (TEE);  Surgeon: Bonnie Rives, MD;  Location: [facility] ENDOSCOPY;  Service: Cardiology;  Laterality: N/A;  . US ECHOCARDIOGRAPHY     EF 55-60%    Family History  Problem Relation Age of Onset  . Diabetes Mother   . Breast cancer Mother 67  . Hypertension Father   . Ovarian cancer Neg Hx   . Colon cancer Neg Hx   . Heart disease Neg Hx     Social History   Socioeconomic History  . Marital status: Divorced    Spouse name: Not on file  . Number of children: Not on file  . Years of education: Not on file  . Highest education level: Not on file  Occupational History  . Not on file  Tobacco Use  . Smoking status: Current Every Day Smoker    Packs/day: 0.25    Years: 20.00    Pack years: 5.00    Types: Cigarettes    Last attempt to  quit: 07/24/2015    Years since quitting: 4.0  . Smokeless tobacco: Never Used  Substance and Sexual Activity  . Alcohol use: Yes    Alcohol/week: 5.0 standard drinks    Types: 5 Standard drinks or equivalent per week  . Drug use: No  . Sexual activity: Not Currently    Birth control/protection: Surgical  Other Topics Concern  . Not on file  Social History Narrative  . Not on file   Social Determinants of Health   Financial Resource Strain:   . Difficulty of Paying Living Expenses: Not on file  Food Insecurity:   . Worried About Programme researcher, broadcasting/film/video in the Last Year: Not on file  . Ran Out of Food in the Last Year: Not on file  Transportation Needs:   . Lack of Transportation (Medical): Not on file  . Lack of Transportation (Non-Medical): Not on file  Physical Activity:   . Days of Exercise per Week: Not on file  . Minutes of Exercise per Session: Not on file  Stress:   . Feeling of Stress : Not on file  Social Connections:   .  Frequency of Communication with Friends and Family: Not on file  . Frequency of Social Gatherings with Friends and Family: Not on file  . Attends Religious Services: Not on file  . Active Member of Clubs or Organizations: Not on file  . Attends Banker Meetings: Not on file  . Marital Status: Not on file  Intimate Partner Violence:   . Fear of Current or Ex-Partner: Not on file  . Emotionally Abused: Not on file  . Physically Abused: Not on file  . Sexually Abused: Not on file    Current Outpatient Medications  Medication Sig Dispense Refill  . aspirin EC 81 MG tablet Take 1 tablet (81 mg total) by mouth daily.    . furosemide (LASIX) 20 MG tablet Take 1 tablet (20 mg total) by mouth daily as needed (swelling or weight gain). 90 tablet 3  . ibuprofen (ADVIL) 200 MG tablet Take 600 mg by mouth every 6 (six) hours as needed for headache or moderate pain.    Marland Kitchen losartan (COZAAR) 25 MG tablet Take 1 tablet (25 mg total) by mouth daily. 90  tablet 3  . metFORMIN (GLUCOPHAGE) 500 MG tablet Take 1 tablet (500 mg total) by mouth 2 (two) times daily with a meal. 180 tablet 3  . metoprolol succinate (TOPROL-XL) 50 MG 24 hr tablet TAKE 1 TABLET (50 MG TOTAL) BY MOUTH DAILY. TAKE WITH OR IMMEDIATELY FOLLOWING A MEAL. (Patient taking differently: Take 50 mg by mouth daily. ) 90 tablet 0  . nortriptyline (PAMELOR) 10 MG capsule Take 20 mg by mouth at bedtime.    . rosuvastatin (CRESTOR) 20 MG tablet Take 1 tablet (20 mg total) by mouth daily. (Patient taking differently: Take 20 mg by mouth at bedtime. ) 90 tablet 3  . VICTOZA 18 MG/3ML SOPN Inject 0.3 mLs (1.8 mg total) into the skin daily. 5 pen 3  . zolpidem (AMBIEN CR) 6.25 MG CR tablet Take 1 tablet (6.25 mg total) by mouth at bedtime as needed for sleep. 30 tablet 2   No current facility-administered medications for this visit.    Allergies  Allergen Reactions  . Penicillins Other (See Comments)    Convulsions  Did it involve swelling of the face/tongue/throat, SOB, or low BP? No Did it involve sudden or severe rash/hives, skin peeling, or any reaction on the inside of your mouth or nose? No Did you need to seek medical attention at a hospital or doctor's office? No When did it last happen?Childhood allergy If all above answers are "NO", may proceed with cephalosporin use.       Review of Systems:   General:  normal appetite, decreased energy, no weight gain, no weight loss, no fever  Cardiac:  no chest pain with exertion, occasional atypical chest pain at rest, +SOB with exertion, no resting SOB, + PND, + orthopnea, + palpitations, no arrhythmia, no atrial fibrillation, no LE edema, + dizzy spells, no syncope  Respiratory:  + exertional shortness of breath, no home oxygen, no productive cough, no dry cough, no bronchitis, no wheezing, no hemoptysis, no asthma, no pain with inspiration or cough, no sleep apnea, no CPAP at night  GI:   no difficulty swallowing, no  reflux, no frequent heartburn, no hiatal hernia, no abdominal pain, no constipation, no diarrhea, no hematochezia, no hematemesis, no melena  GU:   no dysuria,  no frequency, no urinary tract infection, no hematuria, no kidney stones, no kidney disease  Vascular:  no pain suggestive of claudication,  no pain in feet, no leg cramps, no varicose veins, no DVT, no non-healing foot ulcer  Neuro:   no stroke, no TIA's, no seizures, no headaches, no temporary blindness one eye,  no slurred speech, no peripheral neuropathy, no chronic pain, no instability of gait, no memory/cognitive dysfunction  Musculoskeletal: no arthritis, no joint swelling, no myalgias, no difficulty walking, normal mobility   Skin:   no rash, no itching, no skin infections, no pressure sores or ulcerations  Psych:   no anxiety, no depression, no nervousness, no unusual recent stress  Eyes:   no blurry vision, no floaters, + recent vision changes, does not wear glasses or contacts  ENT:   no hearing loss, no loose or painful teeth, no dentures, last saw dentist within the past 6 months  Hematologic:  no easy bruising, no abnormal bleeding, no clotting disorder, no frequent epistaxis  Endocrine:  + diabetes, occasionally checks CBG's at home     Physical Exam:   BP (!) 146/88   Pulse 80   Temp 97.8 F (36.6 C) (Skin)   Resp 20   Ht  (1.702 m)   Wt 185 lb (83.9 kg)   SpO2 97% Comment: RA  BMI 28.98 kg/m   General:  Moderately obese,  well-appearing  HEENT:  Unremarkable   Neck:   no JVD, no bruits, no adenopathy   Chest:   clear to auscultation, symmetrical breath sounds, no wheezes, no rhonchi   CV:   RRR, grade III/VI holosystolic murmur   Abdomen:  soft, non-tender, no masses   Extremities:  warm, well-perfused, pulses diminished but palpable, no LE edema  Rectal/GU  Deferred  Neuro:   Grossly non-focal and symmetrical throughout  Skin:   Clean and dry, no rashes, no breakdown   Diagnostic Tests:     ECHOCARDIOGRAM REPORT       Patient Name:   Bonnie Ryan Date of Exam: 08/03/2019 Medical Rec #:  [id number]       Height:       67.0 in Accession #:    [id number]      Weight:       180.0 lb Date of Birth:  11-Sep-1955      BSA:          1.93 m Patient Age:    63 years        BP:           118/68 mmHg Patient Gender: F               HR:           77 bpm. Exam Location:  [facility]  Procedure: 2D Echo, Cardiac Doppler and Color Doppler  Indications:    I34.1 Nonrheumatic mitral (valve) prolapse   History:        Patient has prior history of Echocardiogram examinations, most                 recent 11/04/2016. CHF, CAD, Mitral Valve Disease,                 Signs/Symptoms:Shortness of Breath and                 Dizziness/Lightheadedness; Risk Factors:Hypertension, Diabetes,                 Dyslipidemia and Former Smoker. MVP diagnosed when she was 63                 yo. Progressive dyspnea. Wakes  several times a night gasping for                 breath. Frequent dizzy spells.   Sonographer:    Pilar Jarvis RDMS, RVT, RDCS Referring Phys: [id number] CHRISTOPHER Ryan    Sonographer Comments: Very limited windows. Attempts were made to improve image quality using several postures and non-traditional acoustic windows IMPRESSIONS    1. Left ventricular ejection fraction, by visual estimation, is 60 to 65%. The left ventricle has normal function. There is no left ventricular hypertrophy.  2. Left ventricular diastolic parameters are consistent with Grade II diastolic dysfunction (pseudonormalization).  3. The left ventricle has no regional wall motion abnormalities.  4. Global right ventricle has normal systolic function.The right ventricular size is normal. No increase in right ventricular wall thickness.  5. Left atrial size was moderately dilated.  6. The mitral valve Prolapse of anterior leaflet, possible both leaflets. Moderate mitral valve regurgitation.  7. Mildly elevated  pulmonary artery systolic pressure.  FINDINGS  Left Ventricle: Left ventricular ejection fraction, by visual estimation, is 60 to 65%. The left ventricle has normal function. The left ventricle has no regional wall motion abnormalities. There is no left ventricular hypertrophy. Left ventricular  diastolic parameters are consistent with Grade II diastolic dysfunction (pseudonormalization). Normal left atrial pressure.  Right Ventricle: The right ventricular size is normal. No increase in right ventricular wall thickness. Global RV systolic function is has normal systolic function. The tricuspid regurgitant velocity is 2.41 m/s, and with an assumed right atrial pressure  of 10 mmHg, the estimated right ventricular systolic pressure is mildly elevated at 33.2 mmHg.  Left Atrium: Left atrial size was moderately dilated.  Right Atrium: Right atrial size was normal in size  Pericardium: There is no evidence of pericardial effusion.  Mitral Valve: The mitral valve is normal in structure. Moderate to severe mitral valve regurgitation. No evidence of mitral valve stenosis by observation.  Tricuspid Valve: The tricuspid valve is normal in structure. Tricuspid valve regurgitation is mild.  Aortic Valve: The aortic valve was not well visualized. Aortic valve regurgitation is not visualized. The aortic valve is structurally normal, with no evidence of sclerosis or stenosis. Aortic valve mean gradient measures 4.0 mmHg. Aortic valve peak  gradient measures 8.2 mmHg. Aortic valve area, by VTI measures 1.64 cm.  Pulmonic Valve: The pulmonic valve was normal in structure. Pulmonic valve regurgitation is not visualized. Pulmonic regurgitation is not visualized.  Aorta: The aortic root, ascending aorta and aortic arch are all structurally normal, with no evidence of dilitation or obstruction.  Venous: The inferior vena cava is normal in size with greater than 50% respiratory variability,  suggesting right atrial pressure of 3 mmHg.  IAS/Shunts: No atrial level shunt detected by color flow Doppler. There is no evidence of a patent foramen ovale. No ventricular septal defect is seen or detected. There is no evidence of an atrial septal defect.    LEFT VENTRICLE PLAX 2D LVIDd:         5.40 cm  Diastology LVIDs:         2.70 cm  LV e' lateral:   4.57 cm/s LV PW:         1.20 cm  LV E/e' lateral: 21.0 LV IVS:        1.10 cm  LV e' medial:    5.77 cm/s LVOT diam:     1.90 cm  LV E/e' medial:  16.6 LV SV:  114 ml LV SV Index:   57.61 LVOT Area:     2.84 cm    RIGHT VENTRICLE RV S prime:     13.60 cm/s TAPSE (M-mode): 2.3 cm  LEFT ATRIUM         Index LA diam:    5.20 cm 2.69 cm/m  AORTIC VALVE                   PULMONIC VALVE AV Area (Vmax):    1.82 cm    PV Vmax:       0.78 m/s AV Area (Vmean):   1.79 cm    PV Peak grad:  2.4 mmHg AV Area (VTI):     1.64 cm AV Vmax:           143.00 cm/s AV Vmean:          94.700 cm/s AV VTI:            0.256 m AV Peak Grad:      8.2 mmHg AV Mean Grad:      4.0 mmHg LVOT Vmax:         91.60 cm/s LVOT Vmean:        59.800 cm/s LVOT VTI:          0.148 m LVOT/AV VTI ratio: 0.58   AORTA Ao Root diam: 3.10 cm Ao Asc diam:  3.30 cm Ao Arch diam: 3.4 cm  MITRAL VALVE                        TRICUSPID VALVE MV Area (PHT): 3.85 cm             TR Peak grad:   23.2 mmHg MV PHT:        57.13 msec           TR Vmax:        244.00 cm/s MV Decel Time: 197 msec MV E velocity: 96.00 cm/s 103 cm/s  SHUNTS MV A velocity: 56.30 cm/s 70.3 cm/s Systemic VTI:  0.15 m MV E/A ratio:  1.71       1.5       Systemic Diam: 1.90 cm    Julien Nordmann MD Electronically signed by Julien Nordmann MD Signature Date/Time: 08/03/2019/6:49:01 PM      TRANSESOPHOGEAL ECHO REPORT       Patient Name:   Bonnie Ryan Date of Exam: 08/09/2019 Medical Rec #:  [id number]       Height:       67.0 in Accession #:    [id number]       Weight:       187.5 lb Date of Birth:  03-04-1956      BSA:          1.97 m Patient Age:    63 years        BP:           140/113 mmHg Patient Gender: F               HR:           72 bpm. Exam Location:  Inpatient    Procedure: Transesophageal Echo, Cardiac Doppler, Color Doppler and 3D Echo                              MODIFIED REPORT This report was modified by Lennie Odor MD on 08/09/2019 due to error.  Indications:  MVP, mitral regurgiation   History:         Patient has prior history of Echocardiogram examinations, most                  recent 08/03/2019.   Sonographer:     Lavenia Atlas Referring Phys:  [phone] CHRISTOPHER Ryan Diagnosing Phys: Lennie Odor MD     PROCEDURE: The transesophogeal probe was passed through the esophogus of the patient. The patient developed no complications during the procedure.  IMPRESSIONS    1. There is severe mitral valve regurgitation. 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 The leaflets prolapse up to 1.3 cm above the MV annulus. There are 3 regurgitant jets. By 2D assessment, the  ERO is 0.51 cm2 (combining 2 jets, difficult 3rd) with Rvol 64 cc (grossly underestimated). The 3D VCA (AROA) is 0.89 cm2 with Rvol 111 cc. I suspect the true Rvol is in between these. The regurgitation occurs in ~75% of systole. There is systolic flow  reversal in the left upper pulmonary vein. Overall, findings consistent with severe MR from severe MV prolapse (Barlow's disease).  2. Left ventricular ejection fraction by 3D volume is is 62 %.  3. Left ventricular ejection fraction, by visual estimation, is 60 to 65%. The left ventricle has normal function. There is no left ventricular hypertrophy.  4. Left ventricular diastolic function could not be evaluated.  5. The left ventricle has no regional wall motion abnormalities.  6. Global right ventricle has normal systolic function.The right  ventricular size is normal. No increase in right ventricular wall thickness.  7. Left atrial size was moderately dilated.  8. No LAA thrombus.  9. Right atrial size was normal. 10. Severe mitral valve prolapse. 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 Severe mitral valve regurgitation. 12. Severe thickening of the anterior and posterior mitral valve leaflet(s). 13. The tricuspid valve is grossly normal. Tricuspid valve regurgitation is mild. 14. The aortic valve is tricuspid. Aortic valve regurgitation is not visualized. No evidence of aortic valve sclerosis or stenosis. 15. The pulmonic valve was grossly normal. Pulmonic valve regurgitation is not visualized. 16. Minimal plaque invoving the transverse and descending aorta.  FINDINGS  Left Ventricle: Left ventricular ejection fraction, by visual estimation, is 60 to 65%. The left ventricle has normal function. Left ventricular ejection fraction by 3D volume is is 62 % The left ventricle has no regional wall motion abnormalities. The  left ventricular internal cavity size was the left ventricle is normal in size. There is no left ventricular hypertrophy. Left ventricular diastolic function could not be evaluated.  Right Ventricle: The right ventricular size is normal. No increase in right ventricular wall thickness. Global RV systolic function is has normal systolic function.  Left Atrium: Left atrial size was moderately dilated. No LAA thrombus.  Right Atrium: Right atrial size was normal in size  Pericardium: There is no evidence of pericardial effusion.  Mitral Valve: The mitral valve is myxomatous. There is severe holosystolic prolapse of of the mitral valve. There is severe thickening of the anterior and posterior mitral valve leaflet(s). Severe mitral valve regurgitation. There is severe mitral valve  regurgitation. 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 The leaflets prolapse up  to 1.3 cm above the MV annulus. There are 3 regurgitant jets. By 2D assessment, the ERO is 0.51 cm2 (combining 2 jets,  difficult 3rd) with Rvol 64 cc (grossly underestimated). The 3D VCA (AROA)  is 0.89 cm2 with Rvol 111 cc. I suspect the true Rvol is in between these. The regurgitation occurs in ~75% of systole. There is systolic flow reversal in the left upper pulmonary  vein. Overall, findings consistent with severe MR from severe MV prolapse (Barlow's disease).  Tricuspid Valve: The tricuspid valve is grossly normal. Tricuspid valve regurgitation is mild.  Aortic Valve: The aortic valve is tricuspid. Aortic valve regurgitation is not visualized. The aortic valve is structurally normal, with no evidence of sclerosis or stenosis.  Pulmonic Valve: The pulmonic valve was grossly normal. Pulmonic valve regurgitation is not visualized.  Aorta: The aortic root and ascending aorta are structurally normal, with no evidence of dilitation. There is minimal, layered plaque involving the transverse and descending aorta.  Venous: A pattern of systolic flow reversal, suggestive of severe mitral regurgitation is recorded from the left upper pulmonary vein.  Shunts: No atrial level shunt detected by color flow Doppler.      3D Volume EF LV 3D EF:    Left ventricular ejection fraction by 3D volume is is              62 % LV 3D EDV:   165.00 ml LV 3D ESV:   63.00 ml   3D Volume EF LV 3D EF:    61.80 %    AORTA                 Normals Ao Root diam: 3.60 cm 31 mm Ao STJ diam:  3.1 cm Ao Asc diam:  3.30 cm 31 mm  MR Peak grad: 161.4 mmHg MR Vmax:      635.30 cm/s    Lennie Odor MD Electronically signed by Lennie Odor MD Signature Date/Time: 08/09/2019/2:32:33 PM     RIGHT/LEFT HEART CATH AND CORONARY ANGIOGRAPHY  Conclusion  Conclusions: 1. Mild to moderate, non-obstructive coronary artery disease. 2. Normal left and right heart filling pressure. 3. Normal Fick cardiac  output/index.  Recommendations: 1. Decrease furosemide to 20 mg daily as needed for edema/weight gain.  Additional potassium supplementation given prior to discharge home today. 2. Proceed with cardiac surgery evaluation for mitral valve intervention. 3. Medical therapy and risk factor modification to prevent progression of coronary artery disease.  Bonnie Kendall, MD [facility]   Recommendations  Antiplatelet/Anticoag Recommend Aspirin  daily for moderate CAD.  Surgeon Notes    08/09/2019 9:52 AM CV Procedure signed by Bonnie Rives, MD  Indications  Acute on chronic heart failure with preserved ejection fraction (HFpEF) (HCC) [I50.33 (ICD-10-CM)]  Mitral valve prolapse [I34.1 (ICD-10-CM)]  Nonrheumatic mitral valve regurgitation [I34.0 (ICD-10-CM)]  Procedural Details  Technical Details Indication: 63 y.o. year-old woman with history of nonobstructive coronary artery disease, mitral valve prolapse and recently diagnosed severe mitral regurgitation, hypertension, hyperlipidemia, and diabetes mellitus, presenting for evaluation of acute on chronic HFpEF in the setting of severe mitral regurgitation.  GFR: >60 ml/min  Procedure: The risks, benefits, complications, treatment options, and expected outcomes were discussed with the patient. The patient and/or family concurred with the proposed plan, giving informed consent. The patient was sedated with IV midazolam and fentanyl. The right wrist was assessed with a modified Allens test which was normal. The right wrist and elbow were prepped and draped in a sterile fashion. 1% lidocaine was used for local anesthesia. A previously placed antecubital vein IV was exchanged for a 73F slender Glidesheath using modified Seldinger technique. Right heart catheterization was performed by advancing a 73F balloon-tipped catheter through the right  heart chambers into the pulmonary capillary wedge position. Pressure measurements and oxygen  saturations were obtained.  Using the modified Seldinger access technique, a 102F slender Glidesheath was placed in the right radial artery. 3 mg Verapamil was given through the sheath. Heparin 4,000 units were administered. Selective coronary angiography was performed using 679F JL3.5 and JR4 catheters to engage the left and right coronary arteries, respectively. Left heart catheterization was performed using a 679F JR4 catheter. Left ventriculogram was not performed.  At the Ryan of the procedure, the radial artery sheath was removed and a TR band applied to achieve patent hemostasis. There were no immediate complications. The patient was taken to the recovery area in stable condition.  Estimated blood loss <50 mL.   During this procedure medications were administered to achieve and maintain moderate conscious sedation while the patient's heart rate, blood pressure, and oxygen saturation were continuously monitored and I was present face-to-face 100% of this time.  Medications (Filter: Administrations occurring from 08/09/19 1035 to 08/09/19 1153) (important)  Continuous medications are totaled by the amount administered until 08/09/19 1153.  Heparin (Porcine) in NaCl 1000-0.9 UT/500ML-% SOLN (mL) Total volume:  500 mL Date/Time  Rate/Dose/Volume Action  08/09/19 1054  500 mL Given    Heparin (Porcine) in NaCl 1000-0.9 UT/500ML-% SOLN (mL) Total volume:  500 mL Date/Time  Rate/Dose/Volume Action  08/09/19 1054  500 mL Given    0.9 % sodium chloride infusion (mL/kg/hr) Total dose:  Cannot be calculated* Dosing weight:  83.9 *Continuous medication not stopped within the calculation time range. Date/Time  Rate/Dose/Volume Action  08/09/19 1104  84 mL/kg/hr - 7,047.6 mL/hr New Bag/Given    fentaNYL (SUBLIMAZE) injection (mcg) Total dose:  25 mcg Date/Time  Rate/Dose/Volume Action  08/09/19 1106  25 mcg Given    midazolam (VERSED) injection (mg) Total dose:  1 mg Date/Time  Rate/Dose/Volume  Action  08/09/19 1106  1 mg Given    lidocaine (PF) (XYLOCAINE) 1 % injection (mL) Total volume:  6 mL Date/Time  Rate/Dose/Volume Action  08/09/19 1109  3 mL Given  1118  3 mL Given    Radial Cocktail/Verapamil only (mL) Total volume:  10 mL Date/Time  Rate/Dose/Volume Action  08/09/19 1120  10 mL Given    heparin injection (Units) Total dose:  4,000 Units Date/Time  Rate/Dose/Volume Action  08/09/19 1123  4,000 Units Given    Heparin (Porcine) in NaCl 1000-0.9 UT/500ML-% SOLN (mL) Total volume:  500 mL Date/Time  Rate/Dose/Volume Action  08/09/19 1129  500 mL Given    iohexol (OMNIPAQUE) 350 MG/ML injection (mL) Total volume:  35 mL Date/Time  Rate/Dose/Volume Action  08/09/19 1139  35 mL Given    Sedation Time  Sedation Time Physician-1: 31 minutes 46 seconds  Contrast  Medication Name Total Dose  iohexol (OMNIPAQUE) 350 MG/ML injection 35 mL    Radiation/Fluoro  Fluoro time: 4.4 (min) DAP: 33984 (mGycm2) Cumulative Air Kerma: 697 (mGy)  Complications  Complications documented before study signed (08/09/2019 6:28 PM)   No complications were associated with this study.  Documented by Bonnie Kendall, MD - 08/09/2019 6:27 PM    Coronary Findings  Diagnostic Dominance: Right Left Main  Vessel is large.  Mid LM to Dist LM lesion 15% stenosed  Mid LM to Dist LM lesion is 15% stenosed. The lesion is eccentric.  Left Anterior Descending  Mid LAD lesion 50% stenosed  Mid LAD lesion is 50% stenosed.  First Diagonal Branch  Vessel is moderate in size.  Second  Diagonal Branch  Vessel is small in size.  Third Diagonal Branch  Vessel is small in size.  Ramus Intermedius  Vessel is small. Vessel is angiographically normal.  Left Circumflex  Vessel is large.  Mid Cx lesion 25% stenosed  Mid Cx lesion is 25% stenosed.  First Obtuse Marginal Branch  Vessel is small in size.  Second Obtuse Marginal Branch  Vessel is small in size.  Third Obtuse Marginal  Branch  Vessel is small in size.  Fourth Obtuse Marginal Branch  Vessel is small in size.  Right Coronary Artery  Vessel is moderate in size.  Prox RCA lesion 45% stenosed  Prox RCA lesion is 45% stenosed.  Mid RCA lesion 30% stenosed  Mid RCA lesion is 30% stenosed.  Right Posterior Descending Artery  Vessel is small in size.  Right Posterior Atrioventricular Artery  Vessel is moderate in size.  Intervention  No interventions have been documented. Right Heart  Right Heart Pressures RA (mean): 4 mmHg RV (S/EDP): 22/5 mmHg PA (S/D, mean): 21/9 (15) mmHg PCWP (mean): 10 mmHg  Ao sat: 99% PA sat: 72%  Fick CO: 5.5 L/min Fick CI: 2.8 L/min/m^2  Left Heart  Left Ventricle LV Ryan diastolic pressure is normal. LVEDP 8-10 mmHg.  Aortic Valve There is no aortic valve stenosis.  Coronary Diagrams  Diagnostic Dominance: Right  Intervention  Implants   No implant documentation for this case.  Syngo Images  Show images for CARDIAC CATHETERIZATION  Images on Long Term Storage  Show images for [employer], [place]   Link to Procedure Log  Procedure Log    Hemo Data   Most Recent Value  Fick Cardiac Output 5.45 L/min  Fick Cardiac Output Index 2.79 (L/min)/BSA  RA A Wave 6 mmHg  RA V Wave 5 mmHg  RA Mean 4 mmHg  RV Systolic Pressure 22 mmHg  RV Diastolic Pressure 4 mmHg  RV EDP 5 mmHg  PA Systolic Pressure 21 mmHg  PA Diastolic Pressure 9 mmHg  PA Mean 15 mmHg  PW A Wave 11 mmHg  PW V Wave 14 mmHg  PW Mean 10 mmHg  LV Systolic Pressure 101 mmHg  LV Diastolic Pressure 5 mmHg  LV EDP 8 mmHg  AOp Systolic Pressure 94 mmHg  AOp Diastolic Pressure 53 mmHg  AOp Mean Pressure 70 mmHg  LVp Systolic Pressure 98 mmHg  LVp Diastolic Pressure 6 mmHg  LVp EDP Pressure 8 mmHg  QP/QS 1  TPVR Index 5.39 HRUI      Impression:  Patient has mitral valve prolapse with stage D severe symptomatic primary mitral regurgitation.  She describes progressive symptoms of  exertional shortness of breath and fatigue consistent with chronic diastolic congestive heart failure, New York Heart Association functional class IIb.  She has long history of palpitations without any previous history of sustained arrhythmias or syncope.  I have personally reviewed the patient's recent echocardiograms and diagnostic cardiac catheterization.  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  There is significant leaflet thickening with elongation of most of the primary chordae tendinae to both leaflets but there do not appear to be any ruptured cords.  There is a broad central jet of mitral regurgitation that can be broken into 3 separate jets all of which fill the left atrium.  There is flow reversal in the left upper superior pulmonary vein and findings consistent with severe mitral regurgitation.  There is also calcification at the base of the  papillary muscles but there does not appear to be significant calcification in the leaflets themselves or the posterior annulus.  There is moderate left atrial enlargement.  Left ventricular systolic function appears normal.  Diagnostic cardiac catheterization is notable for the absence of significant obstructive coronary artery disease and right heart pressures remain normal.  I agree the patient needs elective mitral valve repair.  Although valve repair will be complex, I remain optimistic that that there is greater than 95% likelihood of successful and durable valve repair.  Operative risk should be low.  The patient appears to be satisfactory candidate for minimally invasive approach for surgery.   Plan:  The patient and her daughter-in-law were counseled at length regarding the indications, risks and potential benefits of mitral valve repair.  The rationale for elective surgery has been explained, including a comparison between surgery and continued medical therapy  with close follow-up.  The likelihood of successful and durable mitral valve repair has been discussed with particular reference to the findings of their recent echocardiogram.  Based upon these findings and previous experience, I have quoted them a greater than 95 percent likelihood of successful valve repair with less than 1 percent risk of mortality or major morbidity.  Alternative surgical approaches have been discussed including a comparison between conventional sternotomy and minimally-invasive techniques.  The relative risks and benefits of each have been reviewed as they pertain to the patient's specific circumstances, and expectations for the patient's postoperative convalescence has been discussed.  The patient desires to proceed with surgery as soon as possible.  In the unlikely event that the patient's valve cannot be successfully repaired, we discussed the possibility of replacing the mitral valve using a mechanical prosthesis with the attendant need for long-term anticoagulation versus the alternative of replacing it using a bioprosthetic tissue valve with its potential for late structural valve deterioration and failure, depending upon the patient's longevity.  The patient specifically requests that if the mitral valve must be replaced that it be done using a mechanical valve valve.     Timing of surgical intervention was discussed in the context of the ongoing COVID-19 pandemic.  Although the patient surgical intervention is by no means urgent, she is very symptomatic and I do not feel that surgery could be delayed for a prolonged period of time.  We have also discussed whether or not it would be wise for the patient to wait until after she has been vaccinated for COVID-19.  The patient is not interested in having the COVID-19 vaccine under any circumstances and desires to proceed with surgery soon as possible.  We tentatively plan to proceed with surgery on Thursday, August 30, 2019.  Prior to  surgery the patient will undergo CT angiography to evaluate the feasibility of peripheral cannulation for surgery.  The patient understands and accepts all potential risks of surgery including but not limited to risk of death, stroke or other neurologic complication, myocardial infarction, congestive heart failure, respiratory failure, renal failure, bleeding requiring transfusion and/or reexploration, arrhythmia, infection or other wound complications, pneumonia, pleural and/or pericardial effusion, pulmonary embolus, aortic dissection or other major vascular complication, or delayed complications related to valve repair or replacement including but not limited to structural valve deterioration and failure, thrombosis, embolization, endocarditis, or paravalvular leak.  Specific risks potentially related to the minimally-invasive approach were discussed at length, including but not limited to risk of conversion to full or partial sternotomy, aortic dissection or other major vascular complication, unilateral acute lung injury or pulmonary  edema, phrenic nerve dysfunction or paralysis, rib fracture, chronic pain, lung hernia, or lymphocele. All of their questions have been answered.    I spent in excess of 90 minutes during the conduct of this office consultation and >50% of this time involved direct face-to-face encounter with the patient for counseling and/or coordination of their care.   Salvatore Decent. Cornelius Moras, MD 08/21/2019 4:09 PM

## 2019-08-21 NOTE — Patient Instructions (Addendum)
Stop taking metformin 2 days prior to surgery  Continue taking all other medications without change through the day before surgery.  Make sure to bring all of your medications with you when you come for your Pre-Admission Testing appointment at [facility] Short-Stay Department.  Have nothing to eat or drink after midnight the night before surgery.  On the morning of surgery take only with a sip of water.  At your appointment for Pre-Admission Testing at the [facility] Short-Stay Department you will be asked to sign permission forms for your upcoming surgery.  By definition your signature on these forms implies that you and/or your designee provide full informed consent for your planned surgical procedure(s), that alternative treatment options have been discussed, that you understand and accept any and all potential risks, and that you have some understanding of what to expect for your post-operative convalescence.  For any major cardiac surgical procedure potential operative risks include but are not limited to at least some risk of death, stroke or other neurologic complication, myocardial infarction, congestive heart failure, respiratory failure, renal failure, bleeding requiring blood transfusion and/or reexploration, irregular heart rhythm, heart block or bradycardia requiring permanent pacemaker, pneumonia, pericardial effusion, pleural effusion, wound infection, pulmonary embolus or other thromboembolic complication, chronic pain, or other complications related to the specific procedure(s) performed.  Please call to schedule a follow-up appointment in our office prior to surgery if you have any unresolved questions about your planned surgical procedure, the associated risks, alternative treatment options, and/or expectations for your post-operative recovery.

## 2019-08-21 NOTE — Progress Notes (Addendum)
     [address]       [place] [address]             [phone]     CARDIOTHORACIC SURGERY CONSULTATION REPORT  Referring Provider is Ryan, Christopher, MD PCP is Ryan, Bonnie M, FNP  Chief Complaint  Patient presents with  . Mitral Regurgitation    Surgical eval, ECHO 08/03/19, Cardiac Cath and TEE 08/09/19  . Mitral Valve Prolapse    HPI:  Patient is a 63-year-old moderately obese female with history of mitral valve prolapse and mitral regurgitation, nonobstructive coronary artery disease, hypertension, hypercholesterolemia, type 2 diabetes mellitus, polycystic ovarian disease and endometriosis who has been referred for surgical consultation to discuss treatment options for management of severe symptomatic primary mitral regurgitation.  Patient states that she was first found to have mitral valve prolapse as a teenager when she presented following a syncopal event.  She has been taking beta-blockers for many years because of symptoms of palpitations.  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  She has been followed intermittently all of her life, most recently by Dr. End.  Over the past year the patient has developed significant decline in functional capacity with worsening exertional shortness of breath and increasing fatigue.  Recent follow-up transthoracic echocardiogram demonstrated significant progression of disease with at least moderate mitral regurgitation but preserved left ventricular systolic function.  Transesophageal echocardiogram was performed August 09, 2019 and confirmed the presence of severe mitral regurgitation.  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  Although there were no obvious ruptured primary chordae tendinae or flail segments there were 3 broad  jets directed centrally throughout the left atrium with systolic flow reversal in the left upper pulmonary vein and regurgitant volume estimated between 64 and 111 mL, all consistent with severe mitral regurgitation.  Left ventricular function remain normal with ejection fraction calculated 62%.  There was moderate left atrial enlargement.  No other significant abnormalities were noted.  Diagnostic cardiac catheterization was performed revealing mild to moderate nonobstructive coronary artery disease with normal left and right heart filling pressures and normal cardiac output.  Cardiothoracic surgical consultation was requested.  Patient is single and lives locally in [place] with her mother.  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  The patient remains reasonably active physically and entirely functionally independent.  However, she complains of gradual progressive decline in her exercise tolerance over the last several years, most notably over the past year.  She states that she gets short of breath with moderate and occasional low level activity.  She occasionally wakes up from her sleep at night and she cannot lay flat in bed, typically sleeping on 4 pillows.  She has some occasional tightness across her chest that is vague and not necessarily related to exertion.  She has occasional palpitations and mild dizzy spells without any history of prolonged tachypalpitations or syncope.  Past Medical History:  Diagnosis Date  . CAD (coronary artery disease)    Nonbstructive CAD by cath 09/2006 30% LAD, 25% RCA. Normal EF of 55-60% by echo 10/2009 with normal wall thickness.; ETT-Myoview 07/28/11: Normal without ischemia, EF 75%. ; LHC 12/12:   LM calcified, mod LAD calcification, mid LAD 30-40%, prox RCA 30-40%, EF 55-65%, 2+ MR  . Diabetes mellitus    Previously   diet-controlled  . Endometriosis   . Family history of adverse reaction to anesthesia     mother used to have vomiting after   . Hypercholesteremia   . Hypertension   . Mitral valve prolapse    moderate by echo 10/2009 with mild MR;  Echocardiogram 07/23/11: Normal LV wall thickness, EF 60%, normal wall motion, moderately severe bileaflet mitral valve prolapse with mild-moderate MR.  This was not felt to be significantly different from prior echo;  RHC 12/12: RA 2, RV 19/2, PA 18/6 with a mean of 12, PCWP 4, LV 103/57 with a mean of 76, AO 103/10;  CO 3.7, CI 1.9, O2 - PA 67%, Ao 97%  . UTI (urinary tract infection)     Past Surgical History:  Procedure Laterality Date  . ABDOMINAL HYSTERECTOMY     tah/bso  . APPENDECTOMY    . BUBBLE STUDY  08/09/2019   Procedure: BUBBLE STUDY;  Surgeon: O'Neal, [place] Thomas, MD;  Location: [facility];  Service: Cardiology;;  . CHOLECYSTECTOMY    . KNEE SURGERY    . LAPAROSCOPY    . RIGHT/LEFT HEART CATH AND CORONARY ANGIOGRAPHY N/A 08/09/2019   Procedure: RIGHT/LEFT HEART CATH AND CORONARY ANGIOGRAPHY;  Surgeon: Ryan, Christopher, MD;  Location: [facility];  Service: Cardiovascular;  Laterality: N/A;  . TEE WITHOUT CARDIOVERSION N/A 08/09/2019   Procedure: TRANSESOPHAGEAL ECHOCARDIOGRAM (TEE);  Surgeon: O'Neal, [place] Thomas, MD;  Location: [facility];  Service: Cardiology;  Laterality: N/A;  . US ECHOCARDIOGRAPHY     EF 55-60%    Family History  Problem Relation Age of Onset  . Diabetes Mother   . Breast cancer Mother 41  . Hypertension Father   . Ovarian cancer Neg Hx   . Colon cancer Neg Hx   . Heart disease Neg Hx     Social History   Socioeconomic History  . Marital status: Divorced    Spouse name: Not on file  . Number of children: Not on file  . Years of education: Not on file  . Highest education level: Not on file  Occupational History  . Not on file  Tobacco Use  . Smoking status: Current Every Day Smoker    Packs/day: 0.25    Years: 20.00    Pack years: 5.00    Types: Cigarettes    Last attempt to  quit: 07/24/2015    Years since quitting: 4.0  . Smokeless tobacco: Never Used  Substance and Sexual Activity  . Alcohol use: Yes    Alcohol/week: 5.0 standard drinks    Types: 5 Standard drinks or equivalent per week  . Drug use: No  . Sexual activity: Not Currently    Birth control/protection: Surgical  Other Topics Concern  . Not on file  Social History Narrative  . Not on file   Social Determinants of Health   Financial Resource Strain:   . Difficulty of Paying Living Expenses: Not on file  Food Insecurity:   . Worried About Running Out of Food in the Last Year: Not on file  . Ran Out of Food in the Last Year: Not on file  Transportation Needs:   . Lack of Transportation (Medical): Not on file  . Lack of Transportation (Non-Medical): Not on file  Physical Activity:   . Days of Exercise per Week: Not on file  . Minutes of Exercise per Session: Not on file  Stress:   . Feeling of Stress : Not on file  Social Connections:   .   Frequency of Communication with Friends and Family: Not on file  . Frequency of Social Gatherings with Friends and Family: Not on file  . Attends Religious Services: Not on file  . Active Member of Clubs or Organizations: Not on file  . Attends Club or Organization Meetings: Not on file  . Marital Status: Not on file  Intimate Partner Violence:   . Fear of Current or Ex-Partner: Not on file  . Emotionally Abused: Not on file  . Physically Abused: Not on file  . Sexually Abused: Not on file    Current Outpatient Medications  Medication Sig Dispense Refill  . aspirin EC 81 MG tablet Take 1 tablet (81 mg total) by mouth daily.    . furosemide (LASIX) 20 MG tablet Take 1 tablet (20 mg total) by mouth daily as needed (swelling or weight gain). 90 tablet 3  . ibuprofen (ADVIL) 200 MG tablet Take 600 mg by mouth every 6 (six) hours as needed for headache or moderate pain.    . losartan (COZAAR) 25 MG tablet Take 1 tablet (25 mg total) by mouth daily. 90  tablet 3  . metFORMIN (GLUCOPHAGE) 500 MG tablet Take 1 tablet (500 mg total) by mouth 2 (two) times daily with a meal. 180 tablet 3  . metoprolol succinate (TOPROL-XL) 50 MG 24 hr tablet TAKE 1 TABLET (50 MG TOTAL) BY MOUTH DAILY. TAKE WITH OR IMMEDIATELY FOLLOWING A MEAL. (Patient taking differently: Take 50 mg by mouth daily. ) 90 tablet 0  . nortriptyline (PAMELOR) 10 MG capsule Take 20 mg by mouth at bedtime.    . rosuvastatin (CRESTOR) 20 MG tablet Take 1 tablet (20 mg total) by mouth daily. (Patient taking differently: Take 20 mg by mouth at bedtime. ) 90 tablet 3  . VICTOZA 18 MG/3ML SOPN Inject 0.3 mLs (1.8 mg total) into the skin daily. 5 pen 3  . zolpidem (AMBIEN CR) 6.25 MG CR tablet Take 1 tablet (6.25 mg total) by mouth at bedtime as needed for sleep. 30 tablet 2   No current facility-administered medications for this visit.    Allergies  Allergen Reactions  . Penicillins Other (See Comments)    Convulsions  Did it involve swelling of the face/tongue/throat, SOB, or low BP? No Did it involve sudden or severe rash/hives, skin peeling, or any reaction on the inside of your mouth or nose? No Did you need to seek medical attention at a hospital or doctor's office? No When did it last happen?Childhood allergy If all above answers are "NO", may proceed with cephalosporin use.       Review of Systems:   General:  normal appetite, decreased energy, no weight gain, no weight loss, no fever  Cardiac:  no chest pain with exertion, occasional atypical chest pain at rest, +SOB with exertion, no resting SOB, + PND, + orthopnea, + palpitations, no arrhythmia, no atrial fibrillation, no LE edema, + dizzy spells, no syncope  Respiratory:  + exertional shortness of breath, no home oxygen, no productive cough, no dry cough, no bronchitis, no wheezing, no hemoptysis, no asthma, no pain with inspiration or cough, no sleep apnea, no CPAP at night  GI:   no difficulty swallowing, no  reflux, no frequent heartburn, no hiatal hernia, no abdominal pain, no constipation, no diarrhea, no hematochezia, no hematemesis, no melena  GU:   no dysuria,  no frequency, no urinary tract infection, no hematuria, no kidney stones, no kidney disease  Vascular:  no pain suggestive of claudication,   no pain in feet, no leg cramps, no varicose veins, no DVT, no non-healing foot ulcer  Neuro:   no stroke, no TIA's, no seizures, no headaches, no temporary blindness one eye,  no slurred speech, no peripheral neuropathy, no chronic pain, no instability of gait, no memory/cognitive dysfunction  Musculoskeletal: no arthritis, no joint swelling, no myalgias, no difficulty walking, normal mobility   Skin:   no rash, no itching, no skin infections, no pressure sores or ulcerations  Psych:   no anxiety, no depression, no nervousness, no unusual recent stress  Eyes:   no blurry vision, no floaters, + recent vision changes, does not wear glasses or contacts  ENT:   no hearing loss, no loose or painful teeth, no dentures, last saw dentist within the past 6 months  Hematologic:  no easy bruising, no abnormal bleeding, no clotting disorder, no frequent epistaxis  Endocrine:  + diabetes, occasionally checks CBG's at home     Physical Exam:   BP (!) 146/88   Pulse 80   Temp 97.8 F (36.6 C) (Skin)   Resp 20   Ht 5' 7" (1.702 m)   Wt 185 lb (83.9 kg)   SpO2 97% Comment: RA  BMI 28.98 kg/m   General:  Moderately obese,  well-appearing  HEENT:  Unremarkable   Neck:   no JVD, no bruits, no adenopathy   Chest:   clear to auscultation, symmetrical breath sounds, no wheezes, no rhonchi   CV:   RRR, grade III/VI holosystolic murmur   Abdomen:  soft, non-tender, no masses   Extremities:  warm, well-perfused, pulses diminished but palpable, no LE edema  Rectal/GU  Deferred  Neuro:   Grossly non-focal and symmetrical throughout  Skin:   Clean and dry, no rashes, no breakdown   Diagnostic Tests:     ECHOCARDIOGRAM REPORT       Patient Name:   Bonnie Ryan Date of Exam: 08/03/2019 Medical Rec #:  [id number]       Height:       67.0 in Accession #:    [id number]      Weight:       180.0 lb Date of Birth:  10/20/1955      BSA:          1.93 m Patient Age:    63 years        BP:           118/68 mmHg Patient Gender: F               HR:           77 bpm. Exam Location:  [facility]  Procedure: 2D Echo, Cardiac Doppler and Color Doppler  Indications:    I34.1 Nonrheumatic mitral (valve) prolapse   History:        Patient has prior history of Echocardiogram examinations, most                 recent 11/04/2016. CHF, CAD, Mitral Valve Disease,                 Signs/Symptoms:Shortness of Breath and                 Dizziness/Lightheadedness; Risk Factors:Hypertension, Diabetes,                 Dyslipidemia and Former Smoker. MVP diagnosed when she was 63                 yo. Progressive dyspnea. Wakes   several times a night gasping for                 breath. Frequent dizzy spells.   Sonographer:    Gary Joseph RDMS, RVT, RDCS Referring Phys: [id number] Bonnie Ryan    Sonographer Comments: Very limited windows. Attempts were made to improve image quality using several postures and non-traditional acoustic windows IMPRESSIONS    1. Left ventricular ejection fraction, by visual estimation, is 60 to 65%. The left ventricle has normal function. There is no left ventricular hypertrophy.  2. Left ventricular diastolic parameters are consistent with Grade II diastolic dysfunction (pseudonormalization).  3. The left ventricle has no regional wall motion abnormalities.  4. Global right ventricle has normal systolic function.The right ventricular size is normal. No increase in right ventricular wall thickness.  5. Left atrial size was moderately dilated.  6. The mitral valve Prolapse of anterior leaflet, possible both leaflets. Moderate mitral valve regurgitation.  7. Mildly elevated  pulmonary artery systolic pressure.  FINDINGS  Left Ventricle: Left ventricular ejection fraction, by visual estimation, is 60 to 65%. The left ventricle has normal function. The left ventricle has no regional wall motion abnormalities. There is no left ventricular hypertrophy. Left ventricular  diastolic parameters are consistent with Grade II diastolic dysfunction (pseudonormalization). Normal left atrial pressure.  Right Ventricle: The right ventricular size is normal. No increase in right ventricular wall thickness. Global RV systolic function is has normal systolic function. The tricuspid regurgitant velocity is 2.41 m/s, and with an assumed right atrial pressure  of 10 mmHg, the estimated right ventricular systolic pressure is mildly elevated at 33.2 mmHg.  Left Atrium: Left atrial size was moderately dilated.  Right Atrium: Right atrial size was normal in size  Pericardium: There is no evidence of pericardial effusion.  Mitral Valve: The mitral valve is normal in structure. Moderate to severe mitral valve regurgitation. No evidence of mitral valve stenosis by observation.  Tricuspid Valve: The tricuspid valve is normal in structure. Tricuspid valve regurgitation is mild.  Aortic Valve: The aortic valve was not well visualized. Aortic valve regurgitation is not visualized. The aortic valve is structurally normal, with no evidence of sclerosis or stenosis. Aortic valve mean gradient measures 4.0 mmHg. Aortic valve peak  gradient measures 8.2 mmHg. Aortic valve area, by VTI measures 1.64 cm.  Pulmonic Valve: The pulmonic valve was normal in structure. Pulmonic valve regurgitation is not visualized. Pulmonic regurgitation is not visualized.  Aorta: The aortic root, ascending aorta and aortic arch are all structurally normal, with no evidence of dilitation or obstruction.  Venous: The inferior vena cava is normal in size with greater than 50% respiratory variability,  suggesting right atrial pressure of 3 mmHg.  IAS/Shunts: No atrial level shunt detected by color flow Doppler. There is no evidence of a patent foramen ovale. No ventricular septal defect is seen or detected. There is no evidence of an atrial septal defect.    LEFT VENTRICLE PLAX 2D LVIDd:         5.40 cm  Diastology LVIDs:         2.70 cm  LV e' lateral:   4.57 cm/s LV PW:         1.20 cm  LV E/e' lateral: 21.0 LV IVS:        1.10 cm  LV e' medial:    5.77 cm/s LVOT diam:     1.90 cm  LV E/e' medial:  16.6 LV SV:           114 ml LV SV Index:   57.61 LVOT Area:     2.84 cm    RIGHT VENTRICLE RV S prime:     13.60 cm/s TAPSE (M-mode): 2.3 cm  LEFT ATRIUM         Index LA diam:    5.20 cm 2.69 cm/m  AORTIC VALVE                   PULMONIC VALVE AV Area (Vmax):    1.82 cm    PV Vmax:       0.78 m/s AV Area (Vmean):   1.79 cm    PV Peak grad:  2.4 mmHg AV Area (VTI):     1.64 cm AV Vmax:           143.00 cm/s AV Vmean:          94.700 cm/s AV VTI:            0.256 m AV Peak Grad:      8.2 mmHg AV Mean Grad:      4.0 mmHg LVOT Vmax:         91.60 cm/s LVOT Vmean:        59.800 cm/s LVOT VTI:          0.148 m LVOT/AV VTI ratio: 0.58   AORTA Ao Root diam: 3.10 cm Ao Asc diam:  3.30 cm Ao Arch diam: 3.4 cm  MITRAL VALVE                        TRICUSPID VALVE MV Area (PHT): 3.85 cm             TR Peak grad:   23.2 mmHg MV PHT:        57.13 msec           TR Vmax:        244.00 cm/s MV Decel Time: 197 msec MV E velocity: 96.00 cm/s 103 cm/s  SHUNTS MV A velocity: 56.30 cm/s 70.3 cm/s Systemic VTI:  0.15 m MV E/A ratio:  1.71       1.5       Systemic Diam: 1.90 cm    Timothy Gollan MD Electronically signed by Timothy Gollan MD Signature Date/Time: 08/03/2019/6:49:01 PM      TRANSESOPHOGEAL ECHO REPORT       Patient Name:   Bonnie Ryan Date of Exam: 08/09/2019 Medical Rec #:  [id number]       Height:       67.0 in Accession #:    [id number]       Weight:       187.5 lb Date of Birth:  04/12/1956      BSA:          1.97 m Patient Age:    63 years        BP:           140/113 mmHg Patient Gender: F               HR:           72 bpm. Exam Location:  Inpatient    Procedure: Transesophageal Echo, Cardiac Doppler, Color Doppler and 3D Echo                              MODIFIED REPORT This report was modified by [place] O'Neal MD on 08/09/2019 due to error.  Indications:       MVP, mitral regurgiation   History:         Patient has prior history of Echocardiogram examinations, most                  recent 08/03/2019.   Sonographer:     Brooke Strickland Referring Phys:  [id number] Bonnie Ryan Diagnosing Phys: [place] O'Neal MD     PROCEDURE: The transesophogeal probe was passed through the esophogus of the patient. The patient developed no complications during the procedure.  IMPRESSIONS    1. There is severe mitral valve regurgitation. 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 The leaflets prolapse up to 1.3 cm above the MV annulus. There are 3 regurgitant jets. By 2D assessment, the  ERO is 0.51 cm2 (combining 2 jets, difficult 3rd) with Rvol 64 cc (grossly underestimated). The 3D VCA (AROA) is 0.89 cm2 with Rvol 111 cc. I suspect the true Rvol is in between these. The regurgitation occurs in ~75% of systole. There is systolic flow  reversal in the left upper pulmonary vein. Overall, findings consistent with severe MR from severe MV prolapse (Barlow's disease).  2. Left ventricular ejection fraction by 3D volume is is 62 %.  3. Left ventricular ejection fraction, by visual estimation, is 60 to 65%. The left ventricle has normal function. There is no left ventricular hypertrophy.  4. Left ventricular diastolic function could not be evaluated.  5. The left ventricle has no regional wall motion abnormalities.  6. Global right ventricle has normal systolic function.The right  ventricular size is normal. No increase in right ventricular wall thickness.  7. Left atrial size was moderately dilated.  8. No LAA thrombus.  9. Right atrial size was normal. 10. Severe mitral valve prolapse. 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 Severe mitral valve regurgitation. 12. Severe thickening of the anterior and posterior mitral valve leaflet(s). 13. The tricuspid valve is grossly normal. Tricuspid valve regurgitation is mild. 14. The aortic valve is tricuspid. Aortic valve regurgitation is not visualized. No evidence of aortic valve sclerosis or stenosis. 15. The pulmonic valve was grossly normal. Pulmonic valve regurgitation is not visualized. 16. Minimal plaque invoving the transverse and descending aorta.  FINDINGS  Left Ventricle: Left ventricular ejection fraction, by visual estimation, is 60 to 65%. The left ventricle has normal function. Left ventricular ejection fraction by 3D volume is is 62 % The left ventricle has no regional wall motion abnormalities. The  left ventricular internal cavity size was the left ventricle is normal in size. There is no left ventricular hypertrophy. Left ventricular diastolic function could not be evaluated.  Right Ventricle: The right ventricular size is normal. No increase in right ventricular wall thickness. Global RV systolic function is has normal systolic function.  Left Atrium: Left atrial size was moderately dilated. No LAA thrombus.  Right Atrium: Right atrial size was normal in size  Pericardium: There is no evidence of pericardial effusion.  Mitral Valve: The mitral valve is myxomatous. There is severe holosystolic prolapse of of the mitral valve. There is severe thickening of the anterior and posterior mitral valve leaflet(s). Severe mitral valve regurgitation. There is severe mitral valve  regurgitation. 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 The leaflets prolapse up  to 1.3 cm above the MV annulus. There are 3 regurgitant jets. By 2D assessment, the ERO is 0.51 cm2 (combining 2 jets,  difficult 3rd) with Rvol 64 cc (grossly underestimated). The 3D VCA (AROA)   is 0.89 cm2 with Rvol 111 cc. I suspect the true Rvol is in between these. The regurgitation occurs in ~75% of systole. There is systolic flow reversal in the left upper pulmonary  vein. Overall, findings consistent with severe MR from severe MV prolapse (Barlow's disease).  Tricuspid Valve: The tricuspid valve is grossly normal. Tricuspid valve regurgitation is mild.  Aortic Valve: The aortic valve is tricuspid. Aortic valve regurgitation is not visualized. The aortic valve is structurally normal, with no evidence of sclerosis or stenosis.  Pulmonic Valve: The pulmonic valve was grossly normal. Pulmonic valve regurgitation is not visualized.  Aorta: The aortic root and ascending aorta are structurally normal, with no evidence of dilitation. There is minimal, layered plaque involving the transverse and descending aorta.  Venous: A pattern of systolic flow reversal, suggestive of severe mitral regurgitation is recorded from the left upper pulmonary vein.  Shunts: No atrial level shunt detected by color flow Doppler.      3D Volume EF LV 3D EF:    Left ventricular ejection fraction by 3D volume is is              62 % LV 3D EDV:   165.00 ml LV 3D ESV:   63.00 ml   3D Volume EF LV 3D EF:    61.80 %    AORTA                 Normals Ao Root diam: 3.60 cm 31 mm Ao STJ diam:  3.1 cm Ao Asc diam:  3.30 cm 31 mm  MR Peak grad: 161.4 mmHg MR Vmax:      635.30 cm/s    B[place]coe O'Neal MD Electronically signed by [place] O'Neal MD Signature Date/Time: 08/09/2019/2:32:33 PM     RIGHT/LEFT HEART CATH AND CORONARY ANGIOGRAPHY  Conclusion  Conclusions: 1. Mild to moderate, non-obstructive coronary artery disease. 2. Normal left and right heart filling pressure. 3. Normal Fick cardiac  output/index.  Recommendations: 1. Decrease furosemide to 20 mg daily as needed for edema/weight gain.  Additional potassium supplementation given prior to discharge home today. 2. Proceed with cardiac surgery evaluation for mitral valve intervention. 3. Medical therapy and risk factor modification to prevent progression of coronary artery disease.  Bonnie End, MD [facility]   Recommendations  Antiplatelet/Anticoag Recommend Aspirin 81mg daily for moderate CAD.  Surgeon Notes    08/09/2019 9:52 AM CV Procedure signed by O'Neal, [place] Thomas, MD  Indications  Acute on chronic heart failure with preserved ejection fraction (HFpEF) (HCC) [I50.33 (ICD-10-CM)]  Mitral valve prolapse [I34.1 (ICD-10-CM)]  Nonrheumatic mitral valve regurgitation [I34.0 (ICD-10-CM)]  Procedural Details  Technical Details Indication: 63 y.o. year-old woman with history of nonobstructive coronary artery disease, mitral valve prolapse and recently diagnosed severe mitral regurgitation, hypertension, hyperlipidemia, and diabetes mellitus, presenting for evaluation of acute on chronic HFpEF in the setting of severe mitral regurgitation.  GFR: >60 ml/min  Procedure: The risks, benefits, complications, treatment options, and expected outcomes were discussed with the patient. The patient and/or family concurred with the proposed plan, giving informed consent. The patient was sedated with IV midazolam and fentanyl. The right wrist was assessed with a modified Allens test which was normal. The right wrist and elbow were prepped and draped in a sterile fashion. 1% lidocaine was used for local anesthesia. A previously placed antecubital vein IV was exchanged for a 5F slender Glidesheath using modified Seldinger technique. Right heart catheterization was performed by advancing a 5F balloon-tipped catheter through the right   heart chambers into the pulmonary capillary wedge position. Pressure measurements and oxygen  saturations were obtained.  Using the modified Seldinger access technique, a 6F slender Glidesheath was placed in the right radial artery. 3 mg Verapamil was given through the sheath. Heparin 4,000 units were administered. Selective coronary angiography was performed using 5F JL3.5 and JR4 catheters to engage the left and right coronary arteries, respectively. Left heart catheterization was performed using a 5F JR4 catheter. Left ventriculogram was not performed.  At the Ryan of the procedure, the radial artery sheath was removed and a TR band applied to achieve patent hemostasis. There were no immediate complications. The patient was taken to the recovery area in stable condition.  Estimated blood loss <50 mL.   During this procedure medications were administered to achieve and maintain moderate conscious sedation while the patient's heart rate, blood pressure, and oxygen saturation were continuously monitored and I was present face-to-face 100% of this time.  Medications (Filter: Administrations occurring from 08/09/19 1035 to 08/09/19 1153) (important)  Continuous medications are totaled by the amount administered until 08/09/19 1153.  Heparin (Porcine) in NaCl 1000-0.9 UT/500ML-% SOLN (mL) Total volume:  500 mL Date/Time  Rate/Dose/Volume Action  08/09/19 1054  500 mL Given    Heparin (Porcine) in NaCl 1000-0.9 UT/500ML-% SOLN (mL) Total volume:  500 mL Date/Time  Rate/Dose/Volume Action  08/09/19 1054  500 mL Given    0.9 % sodium chloride infusion (mL/kg/hr) Total dose:  Cannot be calculated* Dosing weight:  83.9 *Continuous medication not stopped within the calculation time range. Date/Time  Rate/Dose/Volume Action  08/09/19 1104  84 mL/kg/hr - 7,047.6 mL/hr New Bag/Given    fentaNYL (SUBLIMAZE) injection (mcg) Total dose:  25 mcg Date/Time  Rate/Dose/Volume Action  08/09/19 1106  25 mcg Given    midazolam (VERSED) injection (mg) Total dose:  1 mg Date/Time  Rate/Dose/Volume  Action  08/09/19 1106  1 mg Given    lidocaine (PF) (XYLOCAINE) 1 % injection (mL) Total volume:  6 mL Date/Time  Rate/Dose/Volume Action  08/09/19 1109  3 mL Given  1118  3 mL Given    Radial Cocktail/Verapamil only (mL) Total volume:  10 mL Date/Time  Rate/Dose/Volume Action  08/09/19 1120  10 mL Given    heparin injection (Units) Total dose:  4,000 Units Date/Time  Rate/Dose/Volume Action  08/09/19 1123  4,000 Units Given    Heparin (Porcine) in NaCl 1000-0.9 UT/500ML-% SOLN (mL) Total volume:  500 mL Date/Time  Rate/Dose/Volume Action  08/09/19 1129  500 mL Given    iohexol (OMNIPAQUE) 350 MG/ML injection (mL) Total volume:  35 mL Date/Time  Rate/Dose/Volume Action  08/09/19 1139  35 mL Given    Sedation Time  Sedation Time Physician-1: 31 minutes 46 seconds  Contrast  Medication Name Total Dose  iohexol (OMNIPAQUE) 350 MG/ML injection 35 mL    Radiation/Fluoro  Fluoro time: 4.4 (min) DAP: 33984 (mGycm2) Cumulative Air Kerma: 697 (mGy)  Complications  Complications documented before study signed (08/09/2019 6:28 PM)   No complications were associated with this study.  Documented by Ryan, Christopher, MD - 08/09/2019 6:27 PM    Coronary Findings  Diagnostic Dominance: Right Left Main  Vessel is large.  Mid LM to Dist LM lesion 15% stenosed  Mid LM to Dist LM lesion is 15% stenosed. The lesion is eccentric.  Left Anterior Descending  Mid LAD lesion 50% stenosed  Mid LAD lesion is 50% stenosed.  First Diagonal Branch  Vessel is moderate in size.  Second   Diagonal Branch  Vessel is small in size.  Third Diagonal Branch  Vessel is small in size.  Ramus Intermedius  Vessel is small. Vessel is angiographically normal.  Left Circumflex  Vessel is large.  Mid Cx lesion 25% stenosed  Mid Cx lesion is 25% stenosed.  First Obtuse Marginal Branch  Vessel is small in size.  Second Obtuse Marginal Branch  Vessel is small in size.  Third Obtuse Marginal  Branch  Vessel is small in size.  Fourth Obtuse Marginal Branch  Vessel is small in size.  Right Coronary Artery  Vessel is moderate in size.  Prox RCA lesion 45% stenosed  Prox RCA lesion is 45% stenosed.  Mid RCA lesion 30% stenosed  Mid RCA lesion is 30% stenosed.  Right Posterior Descending Artery  Vessel is small in size.  Right Posterior Atrioventricular Artery  Vessel is moderate in size.  Intervention  No interventions have been documented. Right Heart  Right Heart Pressures RA (mean): 4 mmHg RV (S/EDP): 22/5 mmHg PA (S/D, mean): 21/9 (15) mmHg PCWP (mean): 10 mmHg  Ao sat: 99% PA sat: 72%  Fick CO: 5.5 L/min Fick CI: 2.8 L/min/m^2  Left Heart  Left Ventricle LV Ryan diastolic pressure is normal. LVEDP 8-10 mmHg.  Aortic Valve There is no aortic valve stenosis.  Coronary Diagrams  Diagnostic Dominance: Right  Intervention  Implants   No implant documentation for this case.  Syngo Images  Show images for CARDIAC CATHETERIZATION  Images on Long Term Storage  Show images for Hodgman, Raneisha   Link to Procedure Log  Procedure Log    Hemo Data   Most Recent Value  Fick Cardiac Output 5.45 L/min  Fick Cardiac Output Index 2.79 (L/min)/BSA  RA A Wave 6 mmHg  RA V Wave 5 mmHg  RA Mean 4 mmHg  RV Systolic Pressure 22 mmHg  RV Diastolic Pressure 4 mmHg  RV EDP 5 mmHg  PA Systolic Pressure 21 mmHg  PA Diastolic Pressure 9 mmHg  PA Mean 15 mmHg  PW A Wave 11 mmHg  PW V Wave 14 mmHg  PW Mean 10 mmHg  LV Systolic Pressure 101 mmHg  LV Diastolic Pressure 5 mmHg  LV EDP 8 mmHg  AOp Systolic Pressure 94 mmHg  AOp Diastolic Pressure 53 mmHg  AOp Mean Pressure 70 mmHg  LVp Systolic Pressure 98 mmHg  LVp Diastolic Pressure 6 mmHg  LVp EDP Pressure 8 mmHg  QP/QS 1  TPVR Index 5.39 HRUI      Impression:  Patient has mitral valve prolapse with stage D severe symptomatic primary mitral regurgitation.  She describes progressive symptoms of  exertional shortness of breath and fatigue consistent with chronic diastolic congestive heart failure, New York Heart Association functional class IIb.  She has long history of palpitations without any previous history of sustained arrhythmias or syncope.  I have personally reviewed the patient's recent echocardiograms and diagnostic cardiac catheterization.  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  There is significant leaflet thickening with elongation of most of the primary chordae tendinae to both leaflets but there do not appear to be any ruptured cords.  There is a broad central jet of mitral regurgitation that can be broken into 3 separate jets all of which fill the left atrium.  There is flow reversal in the left upper superior pulmonary vein and findings consistent with severe mitral regurgitation.  There is also calcification at the base of the   papillary muscles but there does not appear to be significant calcification in the leaflets themselves or the posterior annulus.  There is moderate left atrial enlargement.  Left ventricular systolic function appears normal.  Diagnostic cardiac catheterization is notable for the absence of significant obstructive coronary artery disease and right heart pressures remain normal.  I agree the patient needs elective mitral valve repair.  Although valve repair will be complex, I remain optimistic that that there is greater than 95% likelihood of successful and durable valve repair.  Operative risk should be low.  The patient appears to be satisfactory candidate for minimally invasive approach for surgery.   Plan:  The patient and her daughter-in-law were counseled at length regarding the indications, risks and potential benefits of mitral valve repair.  The rationale for elective surgery has been explained, including a comparison between surgery and continued medical therapy  with close follow-up.  The likelihood of successful and durable mitral valve repair has been discussed with particular reference to the findings of their recent echocardiogram.  Based upon these findings and previous experience, I have quoted them a greater than 95 percent likelihood of successful valve repair with less than 1 percent risk of mortality or major morbidity.  Alternative surgical approaches have been discussed including a comparison between conventional sternotomy and minimally-invasive techniques.  The relative risks and benefits of each have been reviewed as they pertain to the patient's specific circumstances, and expectations for the patient's postoperative convalescence has been discussed.  The patient desires to proceed with surgery as soon as possible.  In the unlikely event that the patient's valve cannot be successfully repaired, we discussed the possibility of replacing the mitral valve using a mechanical prosthesis with the attendant need for long-term anticoagulation versus the alternative of replacing it using a bioprosthetic tissue valve with its potential for late structural valve deterioration and failure, depending upon the patient's longevity.  The patient specifically requests that if the mitral valve must be replaced that it be done using a mechanical valve valve.     Timing of surgical intervention was discussed in the context of the ongoing COVID-19 pandemic.  Although the patient surgical intervention is by no means urgent, she is very symptomatic and I do not feel that surgery could be delayed for a prolonged period of time.  We have also discussed whether or not it would be wise for the patient to wait until after she has been vaccinated for COVID-19.  The patient is not interested in having the COVID-19 vaccine under any circumstances and desires to proceed with surgery soon as possible.  We tentatively plan to proceed with surgery on Thursday, August 30, 2019.  Prior to  surgery the patient will undergo CT angiography to evaluate the feasibility of peripheral cannulation for surgery.  The patient understands and accepts all potential risks of surgery including but not limited to risk of death, stroke or other neurologic complication, myocardial infarction, congestive heart failure, respiratory failure, renal failure, bleeding requiring transfusion and/or reexploration, arrhythmia, infection or other wound complications, pneumonia, pleural and/or pericardial effusion, pulmonary embolus, aortic dissection or other major vascular complication, or delayed complications related to valve repair or replacement including but not limited to structural valve deterioration and failure, thrombosis, embolization, endocarditis, or paravalvular leak.  Specific risks potentially related to the minimally-invasive approach were discussed at length, including but not limited to risk of conversion to full or partial sternotomy, aortic dissection or other major vascular complication, unilateral acute lung injury or pulmonary   edema, phrenic nerve dysfunction or paralysis, rib fracture, chronic pain, lung hernia, or lymphocele. All of their questions have been answered.    I spent in excess of 90 minutes during the conduct of this office consultation and >50% of this time involved direct face-to-face encounter with the patient for counseling and/or coordination of their care.   Kalin Kyler H. Milania Haubner, MD 08/21/2019 4:09 PM  

## 2019-08-22 ENCOUNTER — Other Ambulatory Visit: Payer: Self-pay | Admitting: *Deleted

## 2019-08-22 DIAGNOSIS — I340 Nonrheumatic mitral (valve) insufficiency: Secondary | ICD-10-CM

## 2019-08-27 ENCOUNTER — Other Ambulatory Visit: Payer: Self-pay

## 2019-08-27 ENCOUNTER — Ambulatory Visit (HOSPITAL_COMMUNITY)
Admission: RE | Admit: 2019-08-27 | Discharge: 2019-08-27 | Disposition: A | Discharge status: Home / Self Care | Payer: Managed Care, Other (non HMO) | Source: Ambulatory Visit | Attending: Thoracic Surgery (Cardiothoracic Vascular Surgery) | Admitting: Thoracic Surgery (Cardiothoracic Vascular Surgery)

## 2019-08-27 ENCOUNTER — Encounter (HOSPITAL_COMMUNITY): Payer: Self-pay

## 2019-08-27 DIAGNOSIS — I340 Nonrheumatic mitral (valve) insufficiency: Secondary | ICD-10-CM

## 2019-08-27 MED ORDER — IOHEXOL 350 MG/ML SOLN
100.0000 mL | Freq: Once | INTRAVENOUS | Status: AC | PRN
  Administered 2019-08-27: 100 mL via INTRAVENOUS

## 2019-08-27 MED ORDER — SODIUM CHLORIDE (PF) 0.9 % IJ SOLN
INTRAMUSCULAR | Status: AC
  Filled 2019-08-27: qty 50

## 2019-08-27 NOTE — Progress Notes (Signed)
[id number] [place], [place] - [address] [place] [address] Phone: [phone] Fax: [phone]  [id number] - [place], [place] - [address] [place] [address] Phone: [phone] Fax: [phone]      Your procedure is scheduled on January 7  Report to [facility] Main Entrance "A" at 0530 A.M., and check in at the Admitting office.  Call this number if you have problems the morning of surgery:  [phone]  Call [phone] if you have any questions prior to your surgery date Monday-Friday 8am-4pm    Remember:  Do not eat or drink after midnight the night before your surgery   Take these medicines the morning of surgery with A SIP OF WATER   metoprolol succinate (TOPROL-XL)  Follow your surgeon's instructions on when to stop Aspirin.  If no instructions were given by your surgeon then you will need to call the office to get those instructions.    7 days prior to surgery STOP taking any Aspirin (unless otherwise instructed by your surgeon), Aleve, Naproxen, Ibuprofen, Motrin, Advil, Goody's, BC's, all herbal medications, fish oil, and all vitamins.    WHAT DO I DO ABOUT MY DIABETES MEDICATION?   Marland Kitchen Do not take oral diabetes medicines (pills) the morning of surgery. metFORMIN (GLUCOPHAGE) .  Marland Kitchen The day of surgery, do not take other diabetes injectables, including Byetta (exenatide), Bydureon (exenatide ER), Victoza (liraglutide), or Trulicity (dulaglutide).  . If your CBG is greater than 220 mg/dL, you may take  of your sliding scale (correction) dose of insulin.   HOW TO MANAGE YOUR DIABETES BEFORE AND AFTER SURGERY  Why is it important to control my blood sugar before and after surgery? . Improving blood sugar levels before and after surgery helps healing and can limit problems. . A way of improving blood sugar control is eating a healthy diet by: o  Eating less sugar and carbohydrates o   Increasing activity/exercise o  Talking with your doctor about reaching your blood sugar goals . High blood sugars (greater than 180 mg/dL) can raise your risk of infections and slow your recovery, so you will need to focus on controlling your diabetes during the weeks before surgery. . Make sure that the doctor who takes care of your diabetes knows about your planned surgery including the date and location.  How do I manage my blood sugar before surgery? . Check your blood sugar at least 4 times a day, starting 2 days before surgery, to make sure that the level is not too high or low. . Check your blood sugar the morning of your surgery when you wake up and every 2 hours until you get to the Short Stay unit. o If your blood sugar is less than 70 mg/dL, you will need to treat for low blood sugar: - Do not take insulin. - Treat a low blood sugar (less than 70 mg/dL) with  cup of clear juice (cranberry or apple), 4 glucose tablets, OR glucose gel. - Recheck blood sugar in 15 minutes after treatment (to make sure it is greater than 70 mg/dL). If your blood sugar is not greater than 70 mg/dL on recheck, call [phone] for further instructions. . Report your blood sugar to the short stay nurse when you get to Short Stay.  . If you are admitted to the hospital after surgery: o Your blood sugar will be checked by the staff and you will probably be given insulin after  surgery (instead of oral diabetes medicines) to make sure you have good blood sugar levels. o The goal for blood sugar control after surgery is 80-180 mg/dL.  The Morning of Surgery  Do not wear jewelry, make-up or nail polish.  Do not wear lotions, powders, or perfumes/colognes, or deodorant  Do not shave 48 hours prior to surgery.   Do not bring valuables to the hospital.  [facility] is not responsible for any belongings or valuables.  If you are a smoker, DO NOT Smoke 24 hours prior to surgery  If you wear a CPAP at night  please bring your mask, tubing, and machine the morning of surgery   Remember that you must have someone to transport you home after your surgery, and remain with you for 24 hours if you are discharged the same day.   Please bring cases for contacts, glasses, hearing aids, dentures or bridgework because it cannot be worn into surgery.    Leave your suitcase in the car.  After surgery it may be brought to your room.  For patients admitted to the hospital, discharge time will be determined by your treatment team.  Patients discharged the day of surgery will not be allowed to drive home.    Special instructions:   [employer]- Preparing For Surgery  Before surgery, you can play an important role. Because skin is not sterile, your skin needs to be as free of germs as possible. You can reduce the number of germs on your skin by washing with CHG (chlorahexidine gluconate) Soap before surgery.  CHG is an antiseptic cleaner which kills germs and bonds with the skin to continue killing germs even after washing.    Oral Hygiene is also important to reduce your risk of infection.  Remember - BRUSH YOUR TEETH THE MORNING OF SURGERY WITH YOUR REGULAR TOOTHPASTE  Please do not use if you have an allergy to CHG or antibacterial soaps. If your skin becomes reddened/irritated stop using the CHG.  Do not shave (including legs and underarms) for at least 48 hours prior to first CHG shower. It is OK to shave your face.  Please follow these instructions carefully.   1. Shower the NIGHT BEFORE SURGERY and the MORNING OF SURGERY with CHG Soap.   2. If you chose to wash your hair, wash your hair first as usual with your normal shampoo.  3. After you shampoo, rinse your hair and body thoroughly to remove the shampoo.  4. Use CHG as you would any other liquid soap. You can apply CHG directly to the skin and wash gently with a scrungie or a clean washcloth.   5. Apply the CHG Soap to your body ONLY FROM THE  NECK DOWN.  Do not use on open wounds or open sores. Avoid contact with your eyes, ears, mouth and genitals (private parts). Wash Face and genitals (private parts)  with your normal soap.   6. Wash thoroughly, paying special attention to the area where your surgery will be performed.  7. Thoroughly rinse your body with warm water from the neck down.  8. DO NOT shower/wash with your normal soap after using and rinsing off the CHG Soap.  9. Pat yourself dry with a CLEAN TOWEL.  10. Wear CLEAN PAJAMAS to bed the night before surgery, wear comfortable clothes the morning of surgery  11. Place CLEAN SHEETS on your bed the night of your first shower and DO NOT SLEEP WITH PETS.    Day of Surgery:  Please shower the morning of surgery with the CHG soap Do not apply any deodorants/lotions. Please wear clean clothes to the hospital/surgery center.   Remember to brush your teeth WITH YOUR REGULAR TOOTHPASTE.   Please read over the following fact sheets that you were given.

## 2019-08-28 ENCOUNTER — Other Ambulatory Visit: Payer: Self-pay | Admitting: *Deleted

## 2019-08-28 ENCOUNTER — Encounter (HOSPITAL_COMMUNITY): Payer: Self-pay

## 2019-08-28 ENCOUNTER — Other Ambulatory Visit: Payer: Self-pay

## 2019-08-28 ENCOUNTER — Ambulatory Visit (HOSPITAL_COMMUNITY)
Admission: RE | Admit: 2019-08-28 | Discharge: 2019-08-28 | Disposition: A | Discharge status: Home / Self Care | Payer: Managed Care, Other (non HMO) | Source: Ambulatory Visit | Attending: Thoracic Surgery (Cardiothoracic Vascular Surgery) | Admitting: Thoracic Surgery (Cardiothoracic Vascular Surgery)

## 2019-08-28 ENCOUNTER — Ambulatory Visit (HOSPITAL_BASED_OUTPATIENT_CLINIC_OR_DEPARTMENT_OTHER)
Admission: RE | Admit: 2019-08-28 | Discharge: 2019-08-28 | Disposition: A | Discharge status: Home / Self Care | Payer: Managed Care, Other (non HMO) | Source: Ambulatory Visit | Attending: Thoracic Surgery (Cardiothoracic Vascular Surgery) | Admitting: Thoracic Surgery (Cardiothoracic Vascular Surgery)

## 2019-08-28 ENCOUNTER — Encounter (HOSPITAL_COMMUNITY)
Admission: RE | Admit: 2019-08-28 | Discharge: 2019-08-28 | Disposition: A | Discharge status: Home / Self Care | Payer: Managed Care, Other (non HMO) | Source: Ambulatory Visit | Attending: Thoracic Surgery (Cardiothoracic Vascular Surgery) | Admitting: Thoracic Surgery (Cardiothoracic Vascular Surgery)

## 2019-08-28 ENCOUNTER — Other Ambulatory Visit (HOSPITAL_COMMUNITY)
Admission: RE | Admit: 2019-08-28 | Discharge: 2019-08-28 | Disposition: A | Discharge status: Home / Self Care | Payer: Managed Care, Other (non HMO) | Source: Ambulatory Visit | Attending: Thoracic Surgery (Cardiothoracic Vascular Surgery) | Admitting: Thoracic Surgery (Cardiothoracic Vascular Surgery)

## 2019-08-28 DIAGNOSIS — Z20822 Contact with and (suspected) exposure to covid-19: Secondary | ICD-10-CM | POA: Insufficient documentation

## 2019-08-28 DIAGNOSIS — I340 Nonrheumatic mitral (valve) insufficiency: Secondary | ICD-10-CM | POA: Insufficient documentation

## 2019-08-28 DIAGNOSIS — R82998 Other abnormal findings in urine: Secondary | ICD-10-CM

## 2019-08-28 DIAGNOSIS — Z951 Presence of aortocoronary bypass graft: Secondary | ICD-10-CM | POA: Insufficient documentation

## 2019-08-28 DIAGNOSIS — E119 Type 2 diabetes mellitus without complications: Secondary | ICD-10-CM | POA: Insufficient documentation

## 2019-08-28 DIAGNOSIS — I10 Essential (primary) hypertension: Secondary | ICD-10-CM | POA: Insufficient documentation

## 2019-08-28 DIAGNOSIS — E785 Hyperlipidemia, unspecified: Secondary | ICD-10-CM | POA: Insufficient documentation

## 2019-08-28 DIAGNOSIS — Z01818 Encounter for other preprocedural examination: Secondary | ICD-10-CM | POA: Insufficient documentation

## 2019-08-28 LAB — URINALYSIS, ROUTINE W REFLEX MICROSCOPIC
Bilirubin Urine: NEGATIVE
Glucose, UA: NEGATIVE mg/dL
Hgb urine dipstick: NEGATIVE
Ketones, ur: NEGATIVE mg/dL
Nitrite: POSITIVE — AB
Protein, ur: NEGATIVE mg/dL
Specific Gravity, Urine: 1.018 (ref 1.005–1.030)
pH: 6 (ref 5.0–8.0)

## 2019-08-28 LAB — COMPREHENSIVE METABOLIC PANEL
ALT: 56 U/L — ABNORMAL HIGH (ref 0–44)
AST: 51 U/L — ABNORMAL HIGH (ref 15–41)
Albumin: 4 g/dL (ref 3.5–5.0)
Alkaline Phosphatase: 70 U/L (ref 38–126)
Anion gap: 17 — ABNORMAL HIGH (ref 5–15)
BUN: 11 mg/dL (ref 8–23)
CO2: 23 mmol/L (ref 22–32)
Calcium: 8.8 mg/dL — ABNORMAL LOW (ref 8.9–10.3)
Chloride: 97 mmol/L — ABNORMAL LOW (ref 98–111)
Creatinine, Ser: 0.87 mg/dL (ref 0.44–1.00)
GFR calc Af Amer: >60 mL/min (ref >60)
GFR calc non Af Amer: >60 mL/min (ref >60)
Glucose, Bld: 188 mg/dL — ABNORMAL HIGH (ref 70–99)
Potassium: 3.3 mmol/L — ABNORMAL LOW (ref 3.5–5.1)
Sodium: 137 mmol/L (ref 135–145)
Total Bilirubin: 0.6 mg/dL (ref 0.3–1.2)
Total Protein: 6.7 g/dL (ref 6.5–8.1)

## 2019-08-28 LAB — BLOOD GAS, ARTERIAL
Acid-Base Excess: 4.6 mmol/L — ABNORMAL HIGH (ref 0.0–2.0)
Bicarbonate: 27.6 mmol/L (ref 20.0–28.0)
Drawn by: 421801
FIO2: 21
O2 Saturation: 98.6 %
Patient temperature: 37
pCO2 arterial: 34.6 mmHg (ref 32.0–48.0)
pH, Arterial: 7.513 — ABNORMAL HIGH (ref 7.350–7.450)
pO2, Arterial: 111 mmHg — ABNORMAL HIGH (ref 83.0–108.0)

## 2019-08-28 LAB — ABO/RH: ABO/RH(D): AB POS

## 2019-08-28 LAB — CBC
HCT: 38.5 % (ref 36.0–46.0)
Hemoglobin: 13.3 g/dL (ref 12.0–15.0)
MCH: 32 pg (ref 26.0–34.0)
MCHC: 34.5 g/dL (ref 30.0–36.0)
MCV: 92.8 fL (ref 80.0–100.0)
Platelets: 190 10*3/uL (ref 150–400)
RBC: 4.15 MIL/uL (ref 3.87–5.11)
RDW: 13.8 % (ref 11.5–15.5)
WBC: 6.6 10*3/uL (ref 4.0–10.5)
nRBC: 0 % (ref 0.0–0.2)

## 2019-08-28 LAB — SARS CORONAVIRUS 2 (TAT 6-24 HRS): SARS Coronavirus 2: NEGATIVE

## 2019-08-28 LAB — PROTIME-INR
INR: 1 (ref 0.8–1.2)
Prothrombin Time: 13 seconds (ref 11.4–15.2)

## 2019-08-28 LAB — APTT: aPTT: 28 seconds (ref 24–36)

## 2019-08-28 LAB — GLUCOSE, CAPILLARY: Glucose-Capillary: 192 mg/dL — ABNORMAL HIGH (ref 70–99)

## 2019-08-28 LAB — SURGICAL PCR SCREEN
MRSA, PCR: NEGATIVE
Staphylococcus aureus: NEGATIVE

## 2019-08-28 MED ORDER — CIPROFLOXACIN HCL 500 MG PO TABS: 500.0000 mg | ORAL_TABLET | Freq: Once | ORAL | 0 refills | Status: AC

## 2019-08-28 NOTE — Progress Notes (Signed)
PCP - Leanora Cover Cardiologist - Dr. Okey Dupre   Chest x-ray - 08/28/19 EKG - 08/08/19 Stress Test - 2012 ECHO - 08/09/19 Cardiac Cath - 08/09/19  Sleep Study - denies  Pt does not check CBG's or know her fasting number   Aspirin Instructions: Patient instructed to hold all  NSAID's, herbal medications, fish oil and vitamins 7 days prior to surgery.   ERAS Protcol - N/A PRE-SURGERY Ensure or G2- N/A  COVID TEST- 08/28/19 at [facility]. Pt instructed to remain in their car. Educated on Haematologist until [employer]    Anesthesia review: cardiac history  Patient denies shortness of breath, fever, cough and chest pain at PAT appointment   All instructions explained to the patient, with a verbal understanding of the material. Patient agrees to go over the instructions while at home for a better understanding. Patient also instructed to self quarantine after being tested for COVID-19. The opportunity to ask questions was provided.

## 2019-08-28 NOTE — Progress Notes (Signed)
UA results nitrite +. Message sent to Darius Bump, RN

## 2019-08-29 ENCOUNTER — Encounter: Payer: Self-pay | Admitting: Thoracic Surgery (Cardiothoracic Vascular Surgery)

## 2019-08-29 ENCOUNTER — Telehealth: Payer: Self-pay | Admitting: Thoracic Surgery (Cardiothoracic Vascular Surgery)

## 2019-08-29 LAB — HEMOGLOBIN A1C
Hgb A1c MFr Bld: 7 % — ABNORMAL HIGH (ref 4.8–5.6)
Mean Plasma Glucose: 154 mg/dL

## 2019-08-29 MED ORDER — GLUTARALDEHYDE 0.625% SOAKING SOLUTION
TOPICAL | Status: DC
  Filled 2019-08-29: qty 50

## 2019-08-29 MED ORDER — LEVOFLOXACIN IN D5W 500 MG/100ML IV SOLN
500.0000 mg | INTRAVENOUS | Status: AC
  Administered 2019-08-30: 500 mg via INTRAVENOUS
  Filled 2019-08-29: qty 100

## 2019-08-29 MED ORDER — NOREPINEPHRINE 4 MG/250ML-% IV SOLN
0.0000 ug/min | INTRAVENOUS | Status: AC
  Administered 2019-08-30: 4 ug/min via INTRAVENOUS
  Filled 2019-08-29: qty 250

## 2019-08-29 MED ORDER — PLASMA-LYTE 148 IV SOLN
INTRAVENOUS | Status: DC
  Filled 2019-08-29: qty 2.5

## 2019-08-29 MED ORDER — POTASSIUM CHLORIDE 2 MEQ/ML IV SOLN
80.0000 meq | INTRAVENOUS | Status: DC
  Filled 2019-08-29: qty 40

## 2019-08-29 MED ORDER — VANCOMYCIN HCL 1000 MG IV SOLR
INTRAVENOUS | Status: DC
  Filled 2019-08-29: qty 1000

## 2019-08-29 MED ORDER — MILRINONE LACTATE IN DEXTROSE 20-5 MG/100ML-% IV SOLN
0.3000 ug/kg/min | INTRAVENOUS | Status: DC
  Filled 2019-08-29: qty 100

## 2019-08-29 MED ORDER — PHENYLEPHRINE HCL-NACL 20-0.9 MG/250ML-% IV SOLN
30.0000 ug/min | INTRAVENOUS | Status: AC
  Administered 2019-08-30: 25 ug/min via INTRAVENOUS
  Filled 2019-08-29: qty 250

## 2019-08-29 MED ORDER — INSULIN REGULAR(HUMAN) IN NACL 100-0.9 UT/100ML-% IV SOLN
INTRAVENOUS | Status: AC
  Administered 2019-08-30: 1 [IU]/h via INTRAVENOUS
  Filled 2019-08-29: qty 100

## 2019-08-29 MED ORDER — TRANEXAMIC ACID 1000 MG/10ML IV SOLN
1.5000 mg/kg/h | INTRAVENOUS | Status: AC
  Administered 2019-08-30: 1.5 mg/kg/h via INTRAVENOUS
  Filled 2019-08-29: qty 25

## 2019-08-29 MED ORDER — CIPROFLOXACIN HCL 500 MG PO TABS: 500.0000 mg | ORAL_TABLET | Freq: Two times a day (BID) | ORAL | 0 refills | Status: DC

## 2019-08-29 MED ORDER — SODIUM CHLORIDE 0.9 % IV SOLN
INTRAVENOUS | Status: DC
  Filled 2019-08-29: qty 30

## 2019-08-29 MED ORDER — NITROGLYCERIN IN D5W 200-5 MCG/ML-% IV SOLN
2.0000 ug/min | INTRAVENOUS | Status: DC
  Filled 2019-08-29: qty 250

## 2019-08-29 MED ORDER — TRANEXAMIC ACID (OHS) BOLUS VIA INFUSION
15.0000 mg/kg | INTRAVENOUS | Status: AC
  Administered 2019-08-30: 1287 mg via INTRAVENOUS
  Filled 2019-08-29: qty 1287

## 2019-08-29 MED ORDER — VANCOMYCIN HCL 1500 MG/300ML IV SOLN
1500.0000 mg | INTRAVENOUS | Status: AC
  Administered 2019-08-30: 1500 mg via INTRAVENOUS
  Filled 2019-08-29: qty 300

## 2019-08-29 MED ORDER — TRANEXAMIC ACID (OHS) PUMP PRIME SOLUTION
2.0000 mg/kg | INTRAVENOUS | Status: DC
  Filled 2019-08-29: qty 1.72

## 2019-08-29 MED ORDER — EPINEPHRINE PF 1 MG/ML IJ SOLN
0.0000 ug/min | INTRAVENOUS | Status: DC
  Filled 2019-08-29: qty 4

## 2019-08-29 MED ORDER — MANNITOL 20 % IV SOLN
INTRAVENOUS | Status: DC
  Filled 2019-08-29: qty 13

## 2019-08-29 MED ORDER — DEXMEDETOMIDINE HCL IN NACL 400 MCG/100ML IV SOLN
0.1000 ug/kg/h | INTRAVENOUS | Status: AC
  Administered 2019-08-30: .3 ug/kg/h via INTRAVENOUS
  Filled 2019-08-29: qty 100

## 2019-08-29 NOTE — Telephone Encounter (Signed)
Called patient to discuss results of routine preoperative urinalysis and urine culture.  Patient specifically denies any symptoms of urinary burning, urgency, or discharge.  She states that she has not had urinary tract infections in the past.  I explained that urinalysis was abnormal and urine culture is growing greater than 100,000 colonies E. coli, a common urinary pathogen.  We discussed the need for antibiotic therapy and options regarding whether or not to proceed with elective mitral valve repair or postpone surgery.  Patient understands that with preoperative antibiotics her urinary tract infection will likely clear up very quickly.  She understands that there is probably a very small but measurable risk associated with proceeding with surgery at this time.  She understands and desires to proceed with surgery tomorrow as previously planned.  She has already picked up her prescription for ciprofloxacin.  We will make sure that she gets 2 doses of oral ciprofloxacin today and another dose first thing tomorrow morning.  All questions answered.  Purcell Nails, MD 08/29/2019 3:32 PM

## 2019-08-30 ENCOUNTER — Inpatient Hospital Stay (HOSPITAL_COMMUNITY): Payer: Managed Care, Other (non HMO)

## 2019-08-30 ENCOUNTER — Inpatient Hospital Stay (HOSPITAL_COMMUNITY)
Admission: RE | Admit: 2019-08-30 | Discharge: 2019-09-05 | DRG: 219 | Disposition: A | Discharge status: Home / Self Care | Payer: Managed Care, Other (non HMO) | Attending: Thoracic Surgery (Cardiothoracic Vascular Surgery) | Admitting: Thoracic Surgery (Cardiothoracic Vascular Surgery)

## 2019-08-30 ENCOUNTER — Inpatient Hospital Stay (HOSPITAL_COMMUNITY): Payer: Managed Care, Other (non HMO) | Admitting: Physician Assistant

## 2019-08-30 ENCOUNTER — Encounter (HOSPITAL_COMMUNITY)
Admission: RE | Disposition: A | Discharge status: Home / Self Care | Payer: Self-pay | Source: Home / Self Care | Attending: Thoracic Surgery (Cardiothoracic Vascular Surgery)

## 2019-08-30 ENCOUNTER — Other Ambulatory Visit: Payer: Self-pay

## 2019-08-30 ENCOUNTER — Encounter (HOSPITAL_COMMUNITY): Payer: Self-pay | Admitting: Thoracic Surgery (Cardiothoracic Vascular Surgery)

## 2019-08-30 ENCOUNTER — Inpatient Hospital Stay (HOSPITAL_COMMUNITY): Payer: Managed Care, Other (non HMO) | Admitting: Certified Registered Nurse Anesthetist

## 2019-08-30 DIAGNOSIS — Z7984 Long term (current) use of oral hypoglycemic drugs: Secondary | ICD-10-CM | POA: Diagnosis not present

## 2019-08-30 DIAGNOSIS — D696 Thrombocytopenia, unspecified: Secondary | ICD-10-CM | POA: Diagnosis present

## 2019-08-30 DIAGNOSIS — Z87891 Personal history of nicotine dependence: Secondary | ICD-10-CM

## 2019-08-30 DIAGNOSIS — E118 Type 2 diabetes mellitus with unspecified complications: Secondary | ICD-10-CM

## 2019-08-30 DIAGNOSIS — I2510 Atherosclerotic heart disease of native coronary artery without angina pectoris: Secondary | ICD-10-CM | POA: Diagnosis present

## 2019-08-30 DIAGNOSIS — Z6832 Body mass index (BMI) 32.0-32.9, adult: Secondary | ICD-10-CM

## 2019-08-30 DIAGNOSIS — Z803 Family history of malignant neoplasm of breast: Secondary | ICD-10-CM | POA: Diagnosis not present

## 2019-08-30 DIAGNOSIS — Z7982 Long term (current) use of aspirin: Secondary | ICD-10-CM | POA: Diagnosis not present

## 2019-08-30 DIAGNOSIS — I340 Nonrheumatic mitral (valve) insufficiency: Secondary | ICD-10-CM

## 2019-08-30 DIAGNOSIS — J939 Pneumothorax, unspecified: Secondary | ICD-10-CM

## 2019-08-30 DIAGNOSIS — E785 Hyperlipidemia, unspecified: Secondary | ICD-10-CM | POA: Diagnosis present

## 2019-08-30 DIAGNOSIS — Z833 Family history of diabetes mellitus: Secondary | ICD-10-CM

## 2019-08-30 DIAGNOSIS — E669 Obesity, unspecified: Secondary | ICD-10-CM | POA: Diagnosis present

## 2019-08-30 DIAGNOSIS — D62 Acute posthemorrhagic anemia: Secondary | ICD-10-CM | POA: Diagnosis not present

## 2019-08-30 DIAGNOSIS — Z20822 Contact with and (suspected) exposure to covid-19: Secondary | ICD-10-CM | POA: Diagnosis present

## 2019-08-30 DIAGNOSIS — I341 Nonrheumatic mitral (valve) prolapse: Secondary | ICD-10-CM | POA: Diagnosis present

## 2019-08-30 DIAGNOSIS — I5033 Acute on chronic diastolic (congestive) heart failure: Secondary | ICD-10-CM | POA: Diagnosis present

## 2019-08-30 DIAGNOSIS — I110 Hypertensive heart disease with heart failure: Secondary | ICD-10-CM | POA: Diagnosis present

## 2019-08-30 DIAGNOSIS — Z8249 Family history of ischemic heart disease and other diseases of the circulatory system: Secondary | ICD-10-CM | POA: Diagnosis not present

## 2019-08-30 DIAGNOSIS — Z9889 Other specified postprocedural states: Secondary | ICD-10-CM

## 2019-08-30 DIAGNOSIS — R0602 Shortness of breath: Secondary | ICD-10-CM | POA: Diagnosis present

## 2019-08-30 DIAGNOSIS — I480 Paroxysmal atrial fibrillation: Secondary | ICD-10-CM | POA: Diagnosis present

## 2019-08-30 DIAGNOSIS — Z87440 Personal history of urinary (tract) infections: Secondary | ICD-10-CM | POA: Diagnosis not present

## 2019-08-30 DIAGNOSIS — I10 Essential (primary) hypertension: Secondary | ICD-10-CM | POA: Diagnosis present

## 2019-08-30 DIAGNOSIS — J9811 Atelectasis: Secondary | ICD-10-CM

## 2019-08-30 HISTORY — DX: Other specified postprocedural states: Z98.890

## 2019-08-30 HISTORY — PX: TEE WITHOUT CARDIOVERSION: SHX5443

## 2019-08-30 HISTORY — PX: MITRAL VALVE REPAIR: SHX2039

## 2019-08-30 LAB — POCT I-STAT, CHEM 8
BUN: 12 mg/dL (ref 8–23)
BUN: 10 mg/dL (ref 8–23)
BUN: 11 mg/dL (ref 8–23)
BUN: 11 mg/dL (ref 8–23)
BUN: 11 mg/dL (ref 8–23)
BUN: 10 mg/dL (ref 8–23)
BUN: 9 mg/dL (ref 8–23)
Calcium, Ion: 1.14 mmol/L — ABNORMAL LOW (ref 1.15–1.40)
Calcium, Ion: 0.93 mmol/L — ABNORMAL LOW (ref 1.15–1.40)
Calcium, Ion: 1.14 mmol/L — ABNORMAL LOW (ref 1.15–1.40)
Calcium, Ion: 1 mmol/L — ABNORMAL LOW (ref 1.15–1.40)
Calcium, Ion: 1.03 mmol/L — ABNORMAL LOW (ref 1.15–1.40)
Calcium, Ion: 1.02 mmol/L — ABNORMAL LOW (ref 1.15–1.40)
Calcium, Ion: 1.01 mmol/L — ABNORMAL LOW (ref 1.15–1.40)
Chloride: 101 mmol/L (ref 98–111)
Chloride: 100 mmol/L (ref 98–111)
Chloride: 96 mmol/L — ABNORMAL LOW (ref 98–111)
Chloride: 98 mmol/L (ref 98–111)
Chloride: 98 mmol/L (ref 98–111)
Chloride: 101 mmol/L (ref 98–111)
Chloride: 104 mmol/L (ref 98–111)
Creatinine, Ser: 0.7 mg/dL (ref 0.44–1.00)
Creatinine, Ser: 0.5 mg/dL (ref 0.44–1.00)
Creatinine, Ser: 0.6 mg/dL (ref 0.44–1.00)
Creatinine, Ser: 0.6 mg/dL (ref 0.44–1.00)
Creatinine, Ser: 0.6 mg/dL (ref 0.44–1.00)
Creatinine, Ser: 0.6 mg/dL (ref 0.44–1.00)
Creatinine, Ser: 0.6 mg/dL (ref 0.44–1.00)
Glucose, Bld: 234 mg/dL — ABNORMAL HIGH (ref 70–99)
Glucose, Bld: 169 mg/dL — ABNORMAL HIGH (ref 70–99)
Glucose, Bld: 190 mg/dL — ABNORMAL HIGH (ref 70–99)
Glucose, Bld: 175 mg/dL — ABNORMAL HIGH (ref 70–99)
Glucose, Bld: 176 mg/dL — ABNORMAL HIGH (ref 70–99)
Glucose, Bld: 167 mg/dL — ABNORMAL HIGH (ref 70–99)
Glucose, Bld: 241 mg/dL — ABNORMAL HIGH (ref 70–99)
HCT: 30 % — ABNORMAL LOW (ref 36.0–46.0)
HCT: 29 % — ABNORMAL LOW (ref 36.0–46.0)
HCT: 30 % — ABNORMAL LOW (ref 36.0–46.0)
HCT: 21 % — ABNORMAL LOW (ref 36.0–46.0)
HCT: 21 % — ABNORMAL LOW (ref 36.0–46.0)
HCT: 23 % — ABNORMAL LOW (ref 36.0–46.0)
HCT: 26 % — ABNORMAL LOW (ref 36.0–46.0)
Hemoglobin: 10.2 g/dL — ABNORMAL LOW (ref 12.0–15.0)
Hemoglobin: 10.2 g/dL — ABNORMAL LOW (ref 12.0–15.0)
Hemoglobin: 9.9 g/dL — ABNORMAL LOW (ref 12.0–15.0)
Hemoglobin: 7.1 g/dL — ABNORMAL LOW (ref 12.0–15.0)
Hemoglobin: 7.1 g/dL — ABNORMAL LOW (ref 12.0–15.0)
Hemoglobin: 7.8 g/dL — ABNORMAL LOW (ref 12.0–15.0)
Hemoglobin: 8.8 g/dL — ABNORMAL LOW (ref 12.0–15.0)
Potassium: 3 mmol/L — ABNORMAL LOW (ref 3.5–5.1)
Potassium: 2.8 mmol/L — ABNORMAL LOW (ref 3.5–5.1)
Potassium: 3.1 mmol/L — ABNORMAL LOW (ref 3.5–5.1)
Potassium: 3.5 mmol/L (ref 3.5–5.1)
Potassium: 3.5 mmol/L (ref 3.5–5.1)
Potassium: 3.8 mmol/L (ref 3.5–5.1)
Potassium: 3.4 mmol/L — ABNORMAL LOW (ref 3.5–5.1)
Sodium: 139 mmol/L (ref 135–145)
Sodium: 139 mmol/L (ref 135–145)
Sodium: 140 mmol/L (ref 135–145)
Sodium: 137 mmol/L (ref 135–145)
Sodium: 137 mmol/L (ref 135–145)
Sodium: 139 mmol/L (ref 135–145)
Sodium: 140 mmol/L (ref 135–145)
TCO2: 25 mmol/L (ref 22–32)
TCO2: 25 mmol/L (ref 22–32)
TCO2: 29 mmol/L (ref 22–32)
TCO2: 29 mmol/L (ref 22–32)
TCO2: 29 mmol/L (ref 22–32)
TCO2: 27 mmol/L (ref 22–32)
TCO2: 23 mmol/L (ref 22–32)

## 2019-08-30 LAB — POCT I-STAT 7, (LYTES, BLD GAS, ICA,H+H)
Acid-Base Excess: 3 mmol/L — ABNORMAL HIGH (ref 0.0–2.0)
Acid-base deficit: 3 mmol/L — ABNORMAL HIGH (ref 0.0–2.0)
Acid-base deficit: 5 mmol/L — ABNORMAL HIGH (ref 0.0–2.0)
Acid-base deficit: 8 mmol/L — ABNORMAL HIGH (ref 0.0–2.0)
Acid-base deficit: 2 mmol/L (ref 0.0–2.0)
Bicarbonate: 28.6 mmol/L — ABNORMAL HIGH (ref 20.0–28.0)
Bicarbonate: 16.7 mmol/L — ABNORMAL LOW (ref 20.0–28.0)
Bicarbonate: 21.2 mmol/L (ref 20.0–28.0)
Bicarbonate: 22.1 mmol/L (ref 20.0–28.0)
Bicarbonate: 23.4 mmol/L (ref 20.0–28.0)
Calcium, Ion: 0.94 mmol/L — ABNORMAL LOW (ref 1.15–1.40)
Calcium, Ion: 0.89 mmol/L — CL (ref 1.15–1.40)
Calcium, Ion: 0.99 mmol/L — ABNORMAL LOW (ref 1.15–1.40)
Calcium, Ion: 1.07 mmol/L — ABNORMAL LOW (ref 1.15–1.40)
Calcium, Ion: 1.12 mmol/L — ABNORMAL LOW (ref 1.15–1.40)
HCT: 26 % — ABNORMAL LOW (ref 36.0–46.0)
HCT: 20 % — ABNORMAL LOW (ref 36.0–46.0)
HCT: 24 % — ABNORMAL LOW (ref 36.0–46.0)
HCT: 25 % — ABNORMAL LOW (ref 36.0–46.0)
HCT: 26 % — ABNORMAL LOW (ref 36.0–46.0)
Hemoglobin: 8.8 g/dL — ABNORMAL LOW (ref 12.0–15.0)
Hemoglobin: 6.8 g/dL — CL (ref 12.0–15.0)
Hemoglobin: 8.2 g/dL — ABNORMAL LOW (ref 12.0–15.0)
Hemoglobin: 8.5 g/dL — ABNORMAL LOW (ref 12.0–15.0)
Hemoglobin: 8.8 g/dL — ABNORMAL LOW (ref 12.0–15.0)
O2 Saturation: 100 %
O2 Saturation: 100 %
O2 Saturation: 91 %
O2 Saturation: 98 %
O2 Saturation: 97 %
Patient temperature: 36
Potassium: 3.3 mmol/L — ABNORMAL LOW (ref 3.5–5.1)
Potassium: 2.4 mmol/L — CL (ref 3.5–5.1)
Potassium: 3.4 mmol/L — ABNORMAL LOW (ref 3.5–5.1)
Potassium: 3.5 mmol/L (ref 3.5–5.1)
Potassium: 3 mmol/L — ABNORMAL LOW (ref 3.5–5.1)
Sodium: 140 mmol/L (ref 135–145)
Sodium: 142 mmol/L (ref 135–145)
Sodium: 144 mmol/L (ref 135–145)
Sodium: 146 mmol/L — ABNORMAL HIGH (ref 135–145)
Sodium: 145 mmol/L (ref 135–145)
TCO2: 30 mmol/L (ref 22–32)
TCO2: 18 mmol/L — ABNORMAL LOW (ref 22–32)
TCO2: 23 mmol/L (ref 22–32)
TCO2: 23 mmol/L (ref 22–32)
TCO2: 25 mmol/L (ref 22–32)
pCO2 arterial: 49 mmHg — ABNORMAL HIGH (ref 32.0–48.0)
pCO2 arterial: 28.7 mmHg — ABNORMAL LOW (ref 32.0–48.0)
pCO2 arterial: 39 mmHg (ref 32.0–48.0)
pCO2 arterial: 43.5 mmHg (ref 32.0–48.0)
pCO2 arterial: 41.7 mmHg (ref 32.0–48.0)
pH, Arterial: 7.375 (ref 7.350–7.450)
pH, Arterial: 7.297 — ABNORMAL LOW (ref 7.350–7.450)
pH, Arterial: 7.362 (ref 7.350–7.450)
pH, Arterial: 7.373 (ref 7.350–7.450)
pH, Arterial: 7.354 (ref 7.350–7.450)
pO2, Arterial: 440 mmHg — ABNORMAL HIGH (ref 83.0–108.0)
pO2, Arterial: 117 mmHg — ABNORMAL HIGH (ref 83.0–108.0)
pO2, Arterial: 216 mmHg — ABNORMAL HIGH (ref 83.0–108.0)
pO2, Arterial: 63 mmHg — ABNORMAL LOW (ref 83.0–108.0)
pO2, Arterial: 96 mmHg (ref 83.0–108.0)

## 2019-08-30 LAB — GLUCOSE, CAPILLARY
Glucose-Capillary: 109 mg/dL — ABNORMAL HIGH (ref 70–99)
Glucose-Capillary: 129 mg/dL — ABNORMAL HIGH (ref 70–99)
Glucose-Capillary: 130 mg/dL — ABNORMAL HIGH (ref 70–99)
Glucose-Capillary: 145 mg/dL — ABNORMAL HIGH (ref 70–99)
Glucose-Capillary: 147 mg/dL — ABNORMAL HIGH (ref 70–99)
Glucose-Capillary: 161 mg/dL — ABNORMAL HIGH (ref 70–99)
Glucose-Capillary: 164 mg/dL — ABNORMAL HIGH (ref 70–99)
Glucose-Capillary: 164 mg/dL — ABNORMAL HIGH (ref 70–99)
Glucose-Capillary: 167 mg/dL — ABNORMAL HIGH (ref 70–99)
Glucose-Capillary: 168 mg/dL — ABNORMAL HIGH (ref 70–99)
Glucose-Capillary: 266 mg/dL — ABNORMAL HIGH (ref 70–99)

## 2019-08-30 LAB — ECHO INTRAOPERATIVE TEE
Height: 67 in
Weight: 3026.47 oz

## 2019-08-30 LAB — PLATELET COUNT: Platelets: 99 10*3/uL — ABNORMAL LOW (ref 150–400)

## 2019-08-30 LAB — CBC
HCT: 30.4 % — ABNORMAL LOW (ref 36.0–46.0)
HCT: 26.8 % — ABNORMAL LOW (ref 36.0–46.0)
Hemoglobin: 10.6 g/dL — ABNORMAL LOW (ref 12.0–15.0)
Hemoglobin: 9.1 g/dL — ABNORMAL LOW (ref 12.0–15.0)
MCH: 32.9 pg (ref 26.0–34.0)
MCH: 32.6 pg (ref 26.0–34.0)
MCHC: 34.9 g/dL (ref 30.0–36.0)
MCHC: 34 g/dL (ref 30.0–36.0)
MCV: 94.4 fL (ref 80.0–100.0)
MCV: 96.1 fL (ref 80.0–100.0)
Platelets: 137 10*3/uL — ABNORMAL LOW (ref 150–400)
Platelets: 122 10*3/uL — ABNORMAL LOW (ref 150–400)
RBC: 3.22 MIL/uL — ABNORMAL LOW (ref 3.87–5.11)
RBC: 2.79 MIL/uL — ABNORMAL LOW (ref 3.87–5.11)
RDW: 13.9 % (ref 11.5–15.5)
RDW: 14.4 % (ref 11.5–15.5)
WBC: 10.7 10*3/uL — ABNORMAL HIGH (ref 4.0–10.5)
WBC: 7.7 10*3/uL (ref 4.0–10.5)
nRBC: 0 % (ref 0.0–0.2)
nRBC: 0 % (ref 0.0–0.2)

## 2019-08-30 LAB — HEMOGLOBIN AND HEMATOCRIT, BLOOD
HCT: 21.1 % — ABNORMAL LOW (ref 36.0–46.0)
Hemoglobin: 7.1 g/dL — ABNORMAL LOW (ref 12.0–15.0)

## 2019-08-30 LAB — PROTIME-INR
INR: 1.3 — ABNORMAL HIGH (ref 0.8–1.2)
Prothrombin Time: 16.2 seconds — ABNORMAL HIGH (ref 11.4–15.2)

## 2019-08-30 LAB — URINE CULTURE: Culture: >=100000 — AB

## 2019-08-30 LAB — BASIC METABOLIC PANEL
Anion gap: 9 (ref 5–15)
BUN: 10 mg/dL (ref 8–23)
CO2: 24 mmol/L (ref 22–32)
Calcium: 8 mg/dL — ABNORMAL LOW (ref 8.9–10.3)
Chloride: 109 mmol/L (ref 98–111)
Creatinine, Ser: 0.8 mg/dL (ref 0.44–1.00)
GFR calc Af Amer: >60 mL/min (ref >60)
GFR calc non Af Amer: >60 mL/min (ref >60)
Glucose, Bld: 173 mg/dL — ABNORMAL HIGH (ref 70–99)
Potassium: 4.5 mmol/L (ref 3.5–5.1)
Sodium: 142 mmol/L (ref 135–145)

## 2019-08-30 LAB — PREPARE RBC (CROSSMATCH)

## 2019-08-30 LAB — APTT: aPTT: 27 seconds (ref 24–36)

## 2019-08-30 LAB — MAGNESIUM: Magnesium: 2.9 mg/dL — ABNORMAL HIGH (ref 1.7–2.4)

## 2019-08-30 SURGERY — REPAIR, MITRAL VALVE, MINIMALLY INVASIVE
Anesthesia: General | Site: Chest | Laterality: Right

## 2019-08-30 MED ORDER — SUGAMMADEX SODIUM 200 MG/2ML IV SOLN
INTRAVENOUS | Status: DC | PRN
  Administered 2019-08-30: 200 mg via INTRAVENOUS

## 2019-08-30 MED ORDER — HEPARIN SODIUM (PORCINE) 1000 UNIT/ML IJ SOLN
INTRAMUSCULAR | Status: AC
  Filled 2019-08-30: qty 1

## 2019-08-30 MED ORDER — PHENYLEPHRINE 40 MCG/ML (10ML) SYRINGE FOR IV PUSH (FOR BLOOD PRESSURE SUPPORT)
PREFILLED_SYRINGE | INTRAVENOUS | Status: AC
  Filled 2019-08-30: qty 10

## 2019-08-30 MED ORDER — ROCURONIUM BROMIDE 10 MG/ML (PF) SYRINGE
PREFILLED_SYRINGE | INTRAVENOUS | Status: AC
  Filled 2019-08-30: qty 10

## 2019-08-30 MED ORDER — SODIUM BICARBONATE 8.4 % IV SOLN
INTRAVENOUS | Status: AC
  Filled 2019-08-30: qty 50

## 2019-08-30 MED ORDER — ASPIRIN EC 325 MG PO TBEC: 325.0000 mg | DELAYED_RELEASE_TABLET | Freq: Every day | ORAL | Status: DC

## 2019-08-30 MED ORDER — BISACODYL 5 MG PO TBEC
10.0000 mg | DELAYED_RELEASE_TABLET | Freq: Every day | ORAL | Status: DC
  Administered 2019-08-31 – 2019-09-05 (×5): 10 mg via ORAL
  Filled 2019-08-30 (×5): qty 2

## 2019-08-30 MED ORDER — OXYCODONE HCL 5 MG PO TABS
5.0000 mg | ORAL_TABLET | ORAL | Status: DC | PRN
  Administered 2019-08-30: 5 mg via ORAL
  Administered 2019-08-30 – 2019-09-05 (×15): 10 mg via ORAL
  Filled 2019-08-30 (×15): qty 2
  Filled 2019-08-30: qty 1
  Filled 2019-08-30 (×2): qty 2

## 2019-08-30 MED ORDER — INSULIN REGULAR(HUMAN) IN NACL 100-0.9 UT/100ML-% IV SOLN: INTRAVENOUS | Status: DC

## 2019-08-30 MED ORDER — NITROGLYCERIN IN D5W 200-5 MCG/ML-% IV SOLN
0.0000 ug/min | INTRAVENOUS | Status: DC
  Administered 2019-08-30: 0 ug/min via INTRAVENOUS

## 2019-08-30 MED ORDER — PLASMA-LYTE 148 IV SOLN
INTRAVENOUS | Status: DC | PRN
  Administered 2019-08-30: 500 mL via INTRAVASCULAR

## 2019-08-30 MED ORDER — PROTAMINE SULFATE 10 MG/ML IV SOLN
INTRAVENOUS | Status: AC
  Filled 2019-08-30: qty 25

## 2019-08-30 MED ORDER — BISACODYL 10 MG RE SUPP: 10.0000 mg | Freq: Every day | RECTAL | Status: DC

## 2019-08-30 MED ORDER — POTASSIUM CHLORIDE 10 MEQ/50ML IV SOLN
10.0000 meq | INTRAVENOUS | Status: AC
  Administered 2019-08-30 (×2): 10 meq via INTRAVENOUS

## 2019-08-30 MED ORDER — PHENYLEPHRINE HCL-NACL 20-0.9 MG/250ML-% IV SOLN
0.0000 ug/min | INTRAVENOUS | Status: DC
  Administered 2019-08-30: 65 ug/min via INTRAVENOUS
  Administered 2019-08-31: 70 ug/min via INTRAVENOUS
  Administered 2019-08-31: 50 ug/min via INTRAVENOUS
  Filled 2019-08-30 (×6): qty 250

## 2019-08-30 MED ORDER — DOCUSATE SODIUM 100 MG PO CAPS
200.0000 mg | ORAL_CAPSULE | Freq: Every day | ORAL | Status: DC
  Administered 2019-08-31 – 2019-09-05 (×5): 200 mg via ORAL
  Filled 2019-08-30 (×5): qty 2

## 2019-08-30 MED ORDER — SODIUM CHLORIDE 0.9 % IV SOLN: INTRAVENOUS | Status: DC

## 2019-08-30 MED ORDER — ACETAMINOPHEN 500 MG PO TABS
1000.0000 mg | ORAL_TABLET | Freq: Four times a day (QID) | ORAL | Status: AC
  Administered 2019-08-30 – 2019-09-04 (×19): 1000 mg via ORAL
  Filled 2019-08-30 (×19): qty 2

## 2019-08-30 MED ORDER — ROCURONIUM BROMIDE 10 MG/ML (PF) SYRINGE
PREFILLED_SYRINGE | INTRAVENOUS | Status: DC | PRN
  Administered 2019-08-30: 30 mg via INTRAVENOUS
  Administered 2019-08-30: 60 mg via INTRAVENOUS
  Administered 2019-08-30: 20 mg via INTRAVENOUS
  Administered 2019-08-30: 30 mg via INTRAVENOUS

## 2019-08-30 MED ORDER — SODIUM CHLORIDE 0.9 % IR SOLN
Status: DC | PRN
  Administered 2019-08-30: 1000 mL
  Administered 2019-08-30: 3000 mL

## 2019-08-30 MED ORDER — CHLORHEXIDINE GLUCONATE 0.12 % MT SOLN: 15.0000 mL | OROMUCOSAL | Status: AC

## 2019-08-30 MED ORDER — CHLORHEXIDINE GLUCONATE 0.12 % MT SOLN: 15.0000 mL | Freq: Once | OROMUCOSAL | Status: AC

## 2019-08-30 MED ORDER — MIDAZOLAM HCL 2 MG/2ML IJ SOLN: 2.0000 mg | INTRAMUSCULAR | Status: DC | PRN

## 2019-08-30 MED ORDER — FENTANYL CITRATE (PF) 250 MCG/5ML IJ SOLN
INTRAMUSCULAR | Status: DC | PRN
  Administered 2019-08-30: 50 ug via INTRAVENOUS
  Administered 2019-08-30: 150 ug via INTRAVENOUS
  Administered 2019-08-30: 100 ug via INTRAVENOUS
  Administered 2019-08-30: 150 ug via INTRAVENOUS

## 2019-08-30 MED ORDER — FAMOTIDINE IN NACL 20-0.9 MG/50ML-% IV SOLN
20.0000 mg | Freq: Two times a day (BID) | INTRAVENOUS | Status: DC
  Filled 2019-08-30: qty 50

## 2019-08-30 MED ORDER — CHLORHEXIDINE GLUCONATE 0.12 % MT SOLN
OROMUCOSAL | Status: AC
  Administered 2019-08-30: 15 mL via OROMUCOSAL
  Filled 2019-08-30: qty 15

## 2019-08-30 MED ORDER — ALBUMIN HUMAN 5 % IV SOLN: INTRAVENOUS | Status: DC | PRN

## 2019-08-30 MED ORDER — PROTAMINE SULFATE 10 MG/ML IV SOLN
INTRAVENOUS | Status: AC
  Filled 2019-08-30: qty 10

## 2019-08-30 MED ORDER — INSULIN REGULAR(HUMAN) IN NACL 100-0.9 UT/100ML-% IV SOLN
INTRAVENOUS | Status: DC
  Filled 2019-08-30: qty 100

## 2019-08-30 MED ORDER — EPHEDRINE 5 MG/ML INJ
INTRAVENOUS | Status: AC
  Filled 2019-08-30: qty 10

## 2019-08-30 MED ORDER — ONDANSETRON HCL 4 MG/2ML IJ SOLN
INTRAMUSCULAR | Status: AC
  Filled 2019-08-30: qty 2

## 2019-08-30 MED ORDER — ACETAMINOPHEN 160 MG/5ML PO SOLN: 1000.0000 mg | Freq: Four times a day (QID) | ORAL | Status: DC

## 2019-08-30 MED ORDER — LACTATED RINGERS IV SOLN: INTRAVENOUS | Status: DC | PRN

## 2019-08-30 MED ORDER — ASPIRIN 81 MG PO CHEW: 324.0000 mg | CHEWABLE_TABLET | Freq: Every day | ORAL | Status: DC

## 2019-08-30 MED ORDER — PROPOFOL 10 MG/ML IV BOLUS
INTRAVENOUS | Status: DC | PRN
  Administered 2019-08-30: 20 mg via INTRAVENOUS
  Administered 2019-08-30: 150 mg via INTRAVENOUS

## 2019-08-30 MED ORDER — MIDAZOLAM HCL 5 MG/5ML IJ SOLN
INTRAMUSCULAR | Status: DC | PRN
  Administered 2019-08-30 (×4): 1 mg via INTRAVENOUS
  Administered 2019-08-30: 2 mg via INTRAVENOUS

## 2019-08-30 MED ORDER — CALCIUM CHLORIDE 10 % IV SOLN
INTRAVENOUS | Status: DC | PRN
  Administered 2019-08-30: 300 mg via INTRAVENOUS
  Administered 2019-08-30: 400 mg via INTRAVENOUS
  Administered 2019-08-30: 300 mg via INTRAVENOUS

## 2019-08-30 MED ORDER — PHENYLEPHRINE 40 MCG/ML (10ML) SYRINGE FOR IV PUSH (FOR BLOOD PRESSURE SUPPORT)
PREFILLED_SYRINGE | INTRAVENOUS | Status: AC
  Filled 2019-08-30: qty 20

## 2019-08-30 MED ORDER — ACETAMINOPHEN 160 MG/5ML PO SOLN: 650.0000 mg | Freq: Once | ORAL | Status: DC

## 2019-08-30 MED ORDER — SODIUM CHLORIDE 0.9 % IV SOLN: 250.0000 mL | INTRAVENOUS | Status: DC

## 2019-08-30 MED ORDER — ACETAMINOPHEN 650 MG RE SUPP
RECTAL | Status: AC
  Filled 2019-08-30: qty 1

## 2019-08-30 MED ORDER — POTASSIUM CHLORIDE 10 MEQ/50ML IV SOLN
INTRAVENOUS | Status: AC
  Administered 2019-08-30: 10 meq via INTRAVENOUS
  Filled 2019-08-30: qty 150

## 2019-08-30 MED ORDER — ALBUMIN HUMAN 5 % IV SOLN
250.0000 mL | INTRAVENOUS | Status: AC | PRN
  Administered 2019-08-30 (×2): 12.5 g via INTRAVENOUS
  Filled 2019-08-30 (×2): qty 250

## 2019-08-30 MED ORDER — METOPROLOL TARTRATE 5 MG/5ML IV SOLN: 2.5000 mg | INTRAVENOUS | Status: DC | PRN

## 2019-08-30 MED ORDER — ONDANSETRON HCL 4 MG/2ML IJ SOLN: 4.0000 mg | Freq: Four times a day (QID) | INTRAMUSCULAR | Status: DC | PRN

## 2019-08-30 MED ORDER — LACTATED RINGERS IV SOLN: 500.0000 mL | Freq: Once | INTRAVENOUS | Status: DC | PRN

## 2019-08-30 MED ORDER — ACETAMINOPHEN 650 MG RE SUPP: 650.0000 mg | Freq: Once | RECTAL | Status: DC

## 2019-08-30 MED ORDER — CHLORHEXIDINE GLUCONATE 4 % EX LIQD: 30.0000 mL | CUTANEOUS | Status: DC

## 2019-08-30 MED ORDER — ONDANSETRON HCL 4 MG/2ML IJ SOLN
INTRAMUSCULAR | Status: DC | PRN
  Administered 2019-08-30: 4 mg via INTRAVENOUS

## 2019-08-30 MED ORDER — LACTATED RINGERS IV SOLN: INTRAVENOUS | Status: DC

## 2019-08-30 MED ORDER — METOPROLOL TARTRATE 12.5 MG HALF TABLET: 12.5000 mg | ORAL_TABLET | Freq: Once | ORAL | Status: DC

## 2019-08-30 MED ORDER — NORTRIPTYLINE HCL 10 MG PO CAPS
20.0000 mg | ORAL_CAPSULE | Freq: Every day | ORAL | Status: DC
  Administered 2019-09-01 – 2019-09-04 (×4): 20 mg via ORAL
  Filled 2019-08-30 (×6): qty 2

## 2019-08-30 MED ORDER — PROPOFOL 10 MG/ML IV BOLUS
INTRAVENOUS | Status: AC
  Filled 2019-08-30: qty 20

## 2019-08-30 MED ORDER — CHLORHEXIDINE GLUCONATE CLOTH 2 % EX PADS
6.0000 | MEDICATED_PAD | Freq: Every day | CUTANEOUS | Status: DC
  Administered 2019-08-30 – 2019-09-05 (×7): 6 via TOPICAL

## 2019-08-30 MED ORDER — VANCOMYCIN HCL 1000 MG IV SOLR
INTRAVENOUS | Status: DC | PRN
  Administered 2019-08-30: 1000 mL

## 2019-08-30 MED ORDER — DEXMEDETOMIDINE HCL IN NACL 400 MCG/100ML IV SOLN
0.0000 ug/kg/h | INTRAVENOUS | Status: DC
  Administered 2019-08-30: 0.2 ug/kg/h via INTRAVENOUS
  Filled 2019-08-30: qty 100

## 2019-08-30 MED ORDER — EPHEDRINE SULFATE-NACL 50-0.9 MG/10ML-% IV SOSY
PREFILLED_SYRINGE | INTRAVENOUS | Status: DC | PRN
  Administered 2019-08-30: 5 mg via INTRAVENOUS
  Administered 2019-08-30 (×3): 10 mg via INTRAVENOUS

## 2019-08-30 MED ORDER — TRAMADOL HCL 50 MG PO TABS
50.0000 mg | ORAL_TABLET | ORAL | Status: DC | PRN
  Administered 2019-08-31 – 2019-09-01 (×2): 100 mg via ORAL
  Administered 2019-09-02 (×2): 50 mg via ORAL
  Filled 2019-08-30 (×2): qty 2
  Filled 2019-08-30 (×2): qty 1

## 2019-08-30 MED ORDER — PHENYLEPHRINE 40 MCG/ML (10ML) SYRINGE FOR IV PUSH (FOR BLOOD PRESSURE SUPPORT)
PREFILLED_SYRINGE | INTRAVENOUS | Status: DC | PRN
  Administered 2019-08-30 (×6): 80 ug via INTRAVENOUS
  Administered 2019-08-30 (×2): 120 ug via INTRAVENOUS
  Administered 2019-08-30: 80 ug via INTRAVENOUS
  Administered 2019-08-30: 120 ug via INTRAVENOUS
  Administered 2019-08-30: 80 ug via INTRAVENOUS

## 2019-08-30 MED ORDER — LEVOFLOXACIN IN D5W 750 MG/150ML IV SOLN
750.0000 mg | INTRAVENOUS | Status: AC
  Administered 2019-08-31: 750 mg via INTRAVENOUS
  Filled 2019-08-30: qty 150

## 2019-08-30 MED ORDER — 0.9 % SODIUM CHLORIDE (POUR BTL) OPTIME
TOPICAL | Status: DC | PRN
  Administered 2019-08-30: 5000 mL

## 2019-08-30 MED ORDER — MORPHINE SULFATE (PF) 2 MG/ML IV SOLN
1.0000 mg | INTRAVENOUS | Status: DC | PRN
  Administered 2019-08-30 – 2019-09-01 (×6): 2 mg via INTRAVENOUS
  Filled 2019-08-30 (×6): qty 1

## 2019-08-30 MED ORDER — MIDAZOLAM HCL (PF) 10 MG/2ML IJ SOLN
INTRAMUSCULAR | Status: AC
  Filled 2019-08-30: qty 2

## 2019-08-30 MED ORDER — SODIUM CHLORIDE 0.9% FLUSH: 3.0000 mL | INTRAVENOUS | Status: DC | PRN

## 2019-08-30 MED ORDER — MAGNESIUM SULFATE 4 GM/100ML IV SOLN
4.0000 g | Freq: Once | INTRAVENOUS | Status: AC
  Administered 2019-08-30: 4 g via INTRAVENOUS
  Filled 2019-08-30: qty 100

## 2019-08-30 MED ORDER — FAMOTIDINE IN NACL 20-0.9 MG/50ML-% IV SOLN
INTRAVENOUS | Status: AC
  Administered 2019-08-30: 20 mg via INTRAVENOUS
  Filled 2019-08-30: qty 50

## 2019-08-30 MED ORDER — SODIUM CHLORIDE 0.9 % IV SOLN: INTRAVENOUS | Status: DC | PRN

## 2019-08-30 MED ORDER — VANCOMYCIN HCL IN DEXTROSE 1-5 GM/200ML-% IV SOLN
1000.0000 mg | Freq: Once | INTRAVENOUS | Status: AC
  Administered 2019-08-30: 1000 mg via INTRAVENOUS
  Filled 2019-08-30: qty 200

## 2019-08-30 MED ORDER — SODIUM CHLORIDE 0.9% FLUSH
3.0000 mL | Freq: Two times a day (BID) | INTRAVENOUS | Status: DC
  Administered 2019-08-31 – 2019-09-05 (×8): 3 mL via INTRAVENOUS

## 2019-08-30 MED ORDER — PANTOPRAZOLE SODIUM 40 MG PO TBEC
40.0000 mg | DELAYED_RELEASE_TABLET | Freq: Every day | ORAL | Status: DC
  Administered 2019-09-01 – 2019-09-02 (×2): 40 mg via ORAL
  Filled 2019-08-30 (×2): qty 1

## 2019-08-30 MED ORDER — HEPARIN SODIUM (PORCINE) 1000 UNIT/ML IJ SOLN
INTRAMUSCULAR | Status: DC | PRN
  Administered 2019-08-30: 31000 [IU] via INTRAVENOUS

## 2019-08-30 MED ORDER — CALCIUM CHLORIDE 10 % IV SOLN
INTRAVENOUS | Status: AC
  Filled 2019-08-30: qty 30

## 2019-08-30 MED ORDER — PROTAMINE SULFATE 10 MG/ML IV SOLN
INTRAVENOUS | Status: DC | PRN
  Administered 2019-08-30: 310 mg via INTRAVENOUS

## 2019-08-30 MED ORDER — FENTANYL CITRATE (PF) 250 MCG/5ML IJ SOLN
INTRAMUSCULAR | Status: AC
  Filled 2019-08-30: qty 15

## 2019-08-30 MED ORDER — ALBUMIN HUMAN 5 % IV SOLN
INTRAVENOUS | Status: AC
  Administered 2019-08-30: 12.5 g via INTRAVENOUS
  Filled 2019-08-30: qty 500

## 2019-08-30 MED ORDER — SODIUM BICARBONATE 8.4 % IV SOLN
INTRAVENOUS | Status: DC | PRN
  Administered 2019-08-30: 50 meq via INTRAVENOUS

## 2019-08-30 MED ORDER — SODIUM CHLORIDE 0.45 % IV SOLN: INTRAVENOUS | Status: DC | PRN

## 2019-08-30 MED ORDER — DEXTROSE 50 % IV SOLN: 0.0000 mL | INTRAVENOUS | Status: DC | PRN

## 2019-08-30 SURGICAL SUPPLY — 121 items
ADAPTER CARDIO PERF ANTE/RETRO (ADAPTER) ×3 IMPLANT
ADH SKN CLS APL DERMABOND .7 (GAUZE/BANDAGES/DRESSINGS) ×4
ADPR PRFSN 84XANTGRD RTRGD (ADAPTER) ×2
BAG DECANTER FOR FLEXI CONT (MISCELLANEOUS) ×6 IMPLANT
BLADE CLIPPER SURG (BLADE) ×2 IMPLANT
BLADE STERNUM SYSTEM 6 (BLADE) ×1 IMPLANT
BLADE SURG 11 STRL SS (BLADE) ×3 IMPLANT
CANISTER SUCT 3000ML PPV (MISCELLANEOUS) ×6 IMPLANT
CANNULA FEM VENOUS REMOTE 22FR (CANNULA) ×1 IMPLANT
CANNULA FEMORAL ART 14 SM (MISCELLANEOUS) ×3 IMPLANT
CANNULA GUNDRY RCSP 15FR (MISCELLANEOUS) ×3 IMPLANT
CANNULA OPTISITE PERFUSION 16F (CANNULA) IMPLANT
CANNULA OPTISITE PERFUSION 18F (CANNULA) ×1 IMPLANT
CANNULA SUMP PERICARDIAL (CANNULA) ×6 IMPLANT
CATH CPB KIT OWEN (MISCELLANEOUS) IMPLANT
CATH KIT ON-Q SILVERSOAK 5 (CATHETERS) IMPLANT
CATH KIT ON-Q SILVERSOAK 5IN (CATHETERS) IMPLANT
CELLS DAT CNTRL 66122 CELL SVR (MISCELLANEOUS) ×2 IMPLANT
CLIP VESOCCLUDE SM WIDE 24/CT (CLIP) ×1 IMPLANT
CONN ST 1/4X3/8  BEN (MISCELLANEOUS) ×2
CONN ST 1/4X3/8 BEN (MISCELLANEOUS) ×4 IMPLANT
CONNECTOR 1/2X3/8X1/2 3 WAY (MISCELLANEOUS) ×2
CONNECTOR 1/2X3/8X1/2 3WAY (MISCELLANEOUS) ×2 IMPLANT
CONT SPEC 4OZ CLIKSEAL STRL BL (MISCELLANEOUS) ×3 IMPLANT
COVER BACK TABLE 24X17X13 BIG (DRAPES) ×3 IMPLANT
COVER PROBE W GEL 5X96 (DRAPES) ×3 IMPLANT
DERMABOND ADVANCED (GAUZE/BANDAGES/DRESSINGS) ×2
DERMABOND ADVANCED .7 DNX12 (GAUZE/BANDAGES/DRESSINGS) ×4 IMPLANT
DEVICE CLOSURE PERCLS PRGLD 6F (VASCULAR PRODUCTS) IMPLANT
DEVICE SUT CK QUICK LOAD INDV (Prosthesis & Implant Heart) ×2 IMPLANT
DEVICE SUT CK QUICK LOAD MINI (Prosthesis & Implant Heart) ×2 IMPLANT
DEVICE TROCAR PUNCTURE CLOSURE (ENDOMECHANICALS) ×3 IMPLANT
DRAIN CHANNEL 28F RND 3/8 FF (WOUND CARE) ×6 IMPLANT
DRAPE C-ARM 42X72 X-RAY (DRAPES) ×3 IMPLANT
DRAPE CV SPLIT W-CLR ANES SCRN (DRAPES) ×3 IMPLANT
DRAPE INCISE IOBAN 66X45 STRL (DRAPES) ×9 IMPLANT
DRAPE PERI GROIN 82X75IN TIB (DRAPES) ×3 IMPLANT
DRAPE SLUSH/WARMER DISC (DRAPES) ×3 IMPLANT
DRSG AQUACEL AG ADV 3.5X10 (GAUZE/BANDAGES/DRESSINGS) ×1 IMPLANT
DRSG COVADERM 4X8 (GAUZE/BANDAGES/DRESSINGS) ×3 IMPLANT
ELECT BLADE 6.5 EXT (BLADE) ×3 IMPLANT
ELECT REM PT RETURN 9FT ADLT (ELECTROSURGICAL) ×6
ELECTRODE REM PT RTRN 9FT ADLT (ELECTROSURGICAL) ×4 IMPLANT
FELT TEFLON 1X6 (MISCELLANEOUS) ×6 IMPLANT
FEMORAL VENOUS CANN RAP (CANNULA) IMPLANT
GAUZE SPONGE 4X4 12PLY STRL (GAUZE/BANDAGES/DRESSINGS) ×1 IMPLANT
GAUZE SPONGE 4X4 12PLY STRL LF (GAUZE/BANDAGES/DRESSINGS) ×3 IMPLANT
GLOVE BIO SURGEON STRL SZ 6 (GLOVE) ×1 IMPLANT
GLOVE BIO SURGEON STRL SZ 6.5 (GLOVE) ×3 IMPLANT
GLOVE BIOGEL PI IND STRL 6 (GLOVE) IMPLANT
GLOVE BIOGEL PI IND STRL 6.5 (GLOVE) IMPLANT
GLOVE BIOGEL PI INDICATOR 6 (GLOVE) ×1
GLOVE BIOGEL PI INDICATOR 6.5 (GLOVE) ×1
GLOVE ORTHO TXT STRL SZ7.5 (GLOVE) ×8 IMPLANT
GOWN STRL REUS W/ TWL LRG LVL3 (GOWN DISPOSABLE) ×8 IMPLANT
GOWN STRL REUS W/TWL LRG LVL3 (GOWN DISPOSABLE) ×18
GRASPER SUT TROCAR 14GX15 (MISCELLANEOUS) ×3 IMPLANT
IV NS 1000ML (IV SOLUTION) ×3
IV NS 1000ML BAXH (IV SOLUTION) IMPLANT
IV NS IRRIG 3000ML ARTHROMATIC (IV SOLUTION) ×1 IMPLANT
KIT BASIN OR (CUSTOM PROCEDURE TRAY) ×3 IMPLANT
KIT DILATOR VASC 18G NDL (KITS) ×3 IMPLANT
KIT DRAINAGE VACCUM ASSIST (KITS) ×1 IMPLANT
KIT SUCTION CATH 14FR (SUCTIONS) ×3 IMPLANT
KIT SUT CK MINI COMBO 4X17 (Prosthesis & Implant Heart) ×1 IMPLANT
KIT TURNOVER KIT B (KITS) ×3 IMPLANT
LEAD PACING MYOCARDI (MISCELLANEOUS) ×3 IMPLANT
LINE VENT (MISCELLANEOUS) ×1 IMPLANT
NDL AORTIC ROOT 14G 7F (CATHETERS) ×2 IMPLANT
NDL PERC 18GX7CM (NEEDLE) IMPLANT
NEEDLE AORTIC ROOT 14G 7F (CATHETERS) ×6 IMPLANT
NEEDLE PERC 18GX7CM (NEEDLE) ×3 IMPLANT
NS IRRIG 1000ML POUR BTL (IV SOLUTION) ×15 IMPLANT
PACK E MIN INVASIVE VALVE (SUTURE) ×3 IMPLANT
PACK OPEN HEART (CUSTOM PROCEDURE TRAY) ×3 IMPLANT
PAD ARMBOARD 7.5X6 YLW CONV (MISCELLANEOUS) ×6 IMPLANT
PAD ELECT DEFIB RADIOL ZOLL (MISCELLANEOUS) ×3 IMPLANT
PERCLOSE PROGLIDE 6F (VASCULAR PRODUCTS) ×12
POSITIONER HEAD DONUT 9IN (MISCELLANEOUS) ×3 IMPLANT
RETRACTOR WND ALEXIS 18 MED (MISCELLANEOUS) ×2 IMPLANT
RING MITRAL MEMO 4D 40 (Prosthesis & Implant Heart) ×1 IMPLANT
RTRCTR WOUND ALEXIS 18CM MED (MISCELLANEOUS) ×3
SET CANNULATION TOURNIQUET (MISCELLANEOUS) ×3 IMPLANT
SET CARDIOPLEGIA MPS 5001102 (MISCELLANEOUS) ×1 IMPLANT
SET IRRIG TUBING LAPAROSCOPIC (IRRIGATION / IRRIGATOR) ×3 IMPLANT
SET MICROPUNCTURE 5F STIFF (MISCELLANEOUS) ×1 IMPLANT
SHEATH PINNACLE 8F 10CM (SHEATH) ×3 IMPLANT
SOL ANTI FOG 6CC (MISCELLANEOUS) ×2 IMPLANT
SOLUTION ANTI FOG 6CC (MISCELLANEOUS) ×1
SUT BONE WAX W31G (SUTURE) ×3 IMPLANT
SUT ETHIBOND (SUTURE) ×2 IMPLANT
SUT ETHIBOND 2 0 SH (SUTURE) ×2 IMPLANT
SUT ETHIBOND 2-0 RB-1 WHT (SUTURE) ×2 IMPLANT
SUT ETHIBOND X763 2 0 SH 1 (SUTURE) ×3 IMPLANT
SUT GORETEX CV 4 TH 22 36 (SUTURE) ×2 IMPLANT
SUT GORETEX CV4 TH-18 (SUTURE) ×4 IMPLANT
SUT PROLENE 3 0 SH1 36 (SUTURE) ×12 IMPLANT
SUT PROLENE 4 0 RB 1 (SUTURE) ×12
SUT PROLENE 4-0 RB1 .5 CRCL 36 (SUTURE) IMPLANT
SUT PTFE CHORD X 16MM (SUTURE) ×2 IMPLANT
SUT SILK  1 MH (SUTURE) ×1
SUT SILK 1 MH (SUTURE) IMPLANT
SUT SILK 2 0 SH CR/8 (SUTURE) IMPLANT
SUT SILK 3 0 SH CR/8 (SUTURE) IMPLANT
SUT VIC AB 2-0 CTX 36 (SUTURE) IMPLANT
SUT VIC AB 2-0 UR6 27 (SUTURE) ×1 IMPLANT
SUT VIC AB 3-0 SH 8-18 (SUTURE) IMPLANT
SUT VICRYL 2 TP 1 (SUTURE) IMPLANT
SYR 10ML LL (SYRINGE) ×3 IMPLANT
SYSTEM SAHARA CHEST DRAIN ATS (WOUND CARE) ×3 IMPLANT
TAPE CLOTH SURG 4X10 WHT LF (GAUZE/BANDAGES/DRESSINGS) ×1 IMPLANT
TOWEL GREEN STERILE (TOWEL DISPOSABLE) ×3 IMPLANT
TOWEL GREEN STERILE FF (TOWEL DISPOSABLE) ×3 IMPLANT
TRAY FOLEY SLVR 16FR TEMP STAT (SET/KITS/TRAYS/PACK) ×3 IMPLANT
TROCAR XCEL BLADELESS 5X75MML (TROCAR) ×3 IMPLANT
TROCAR XCEL NON-BLD 11X100MML (ENDOMECHANICALS) ×6 IMPLANT
TUBE SUCT INTRACARD DLP 20F (MISCELLANEOUS) ×3 IMPLANT
TUNNELER SHEATH ON-Q 11GX8 DSP (PAIN MANAGEMENT) IMPLANT
UNDERPAD 30X30 (UNDERPADS AND DIAPERS) ×3 IMPLANT
WATER STERILE IRR 1000ML POUR (IV SOLUTION) ×6 IMPLANT
WIRE EMERALD 3MM-J .035X150CM (WIRE) ×4 IMPLANT

## 2019-08-30 NOTE — Anesthesia Procedure Notes (Signed)
Arterial Line Insertion Start/End1/02/2020 6:50 AM, 08/30/2019 6:55 AM Performed by: Waynard Edwards, CRNA, CRNA  Patient location: Pre-op. Preanesthetic checklist: patient identified, IV checked, site marked, risks and benefits discussed, surgical consent, monitors and equipment checked, pre-op evaluation, timeout performed and anesthesia consent Lidocaine 1% used for infiltration Left, radial was placed Catheter size: 20 G Hand hygiene performed , maximum sterile barriers used  and Seldinger technique used Allen's test indicative of satisfactory collateral circulation Attempts: 1 Procedure performed without using ultrasound guided technique. Following insertion, dressing applied and Biopatch. Post procedure assessment: normal and unchanged  Patient tolerated the procedure well with no immediate complications.

## 2019-08-30 NOTE — Progress Notes (Signed)
EKG CRITICAL VALUE     12 lead EKG performed.  Critical value noted.  Horald Chestnut, MD notified.   Alto Denver, CCT 08/30/2019 3:03 PM

## 2019-08-30 NOTE — Progress Notes (Signed)
Call Dr Chilton Si, informed her of patient's CBG 266 this morning in SS.  No orders given.  Ok for surgery.

## 2019-08-30 NOTE — Anesthesia Procedure Notes (Signed)
Central Venous Catheter Insertion Performed by: Kipp Brood, MD, anesthesiologist Start/End1/02/2020 6:50 AM, 08/30/2019 7:00 AM Patient location: Pre-op. Preanesthetic checklist: patient identified, IV checked, site marked, risks and benefits discussed, surgical consent, monitors and equipment checked, pre-op evaluation, timeout performed and anesthesia consent Lidocaine 1% used for infiltration and patient sedated Hand hygiene performed  and maximum sterile barriers used  Catheter size: 8.5 Fr Sheath introducer Procedure performed using ultrasound guided technique. Ultrasound Notes:anatomy identified, needle tip was noted to be adjacent to the nerve/plexus identified, no ultrasound evidence of intravascular and/or intraneural injection and image(s) printed for medical record Attempts: 1 Following insertion, line sutured and dressing applied. Post procedure assessment: blood return through all ports, free fluid flow and no air  Patient tolerated the procedure well with no immediate complications.

## 2019-08-30 NOTE — Anesthesia Preprocedure Evaluation (Addendum)
Anesthesia Evaluation  Patient identified by MRN, date of birth, ID band Patient awake    Reviewed: Allergy & Precautions, NPO status , Patient's Chart, lab work & pertinent test results  Airway Mallampati: II  TM Distance: >3 FB Neck ROM: Full    Dental  (+) Teeth Intact, Dental Advisory Given   Pulmonary shortness of breath, with exertion and lying, former smoker,    breath sounds clear to auscultation       Cardiovascular hypertension, Pt. on medications and Pt. on home beta blockers + CAD   Rhythm:Regular Rate:Normal + Systolic murmurs    Neuro/Psych    GI/Hepatic   Endo/Other  diabetes, Type 2, Oral Hypoglycemic Agents  Renal/GU      Musculoskeletal   Abdominal (+) + obese,   Peds  Hematology   Anesthesia Other Findings   Reproductive/Obstetrics                           Anesthesia Physical Anesthesia Plan  ASA: III  Anesthesia Plan: General   Post-op Pain Management:    Induction: Intravenous  PONV Risk Score and Plan: 3 and Treatment may vary due to age or medical condition  Airway Management Planned: Double Lumen EBT  Additional Equipment: Arterial line, CVP, PA Cath, TEE and Ultrasound Guidance Line Placement  Intra-op Plan:   Post-operative Plan: Possible Post-op intubation/ventilation  Informed Consent: I have reviewed the patients History and Physical, chart, labs and discussed the procedure including the risks, benefits and alternatives for the proposed anesthesia with the patient or authorized representative who has indicated his/her understanding and acceptance.     Dental advisory given  Plan Discussed with: Anesthesiologist and CRNA  Anesthesia Plan Comments:        Anesthesia Quick Evaluation

## 2019-08-30 NOTE — Progress Notes (Signed)
Patient ID: Bonnie Ryan, female   DOB: 1955-09-21, 64 y.o.   MRN: [id number] EVENING ROUNDS NOTE :     [address]       [name] [address]             [phone]                 Day of Surgery Procedure(s) (LRB): MINIMALLY INVASIVE MITRAL VALVE REPAIR (MVR) USING MEMO 4D SIZE 40 (Right) TRANSESOPHAGEAL ECHOCARDIOGRAM (TEE) (N/A)  Total Length of Stay:  LOS: 0 days  BP (!) 146/75   Pulse 80   Temp 98.4 F (36.9 C)   Resp 17   Ht 5\' 7"  (1.702 m)   Wt 85.8 kg   LMP  (LMP Unknown)   SpO2 97%   BMI 29.63 kg/m   .Intake/Output      01/06 0701 - 01/07 0700 01/07 0701 - 01/08 0700   I.V. (mL/kg)  2710 (31.6)   Blood  130   IV Piggyback  785.7   Total Intake(mL/kg)  3625.7 (42.3)   Urine (mL/kg/hr)  1850 (1.9)   Blood  650   Total Output  2500   Net  +1125.7          . sodium chloride Stopped (08/30/19 1608)  . sodium chloride 100 mL/hr at 08/30/19 1700  . [START ON 08/31/2019] sodium chloride    . sodium chloride    . albumin human 12.5 g (08/30/19 1809)  . dexmedetomidine (PRECEDEX) IV infusion 0.2 mcg/kg/hr (08/30/19 1700)  . famotidine (PEPCID) IV Stopped (08/30/19 1604)  . insulin 1.1 mL/hr at 08/30/19 1700  . lactated ringers    . lactated ringers    . lactated ringers    . [START ON 08/31/2019] levofloxacin (LEVAQUIN) IV    . magnesium sulfate 20 mL/hr at 08/30/19 1700  . nitroGLYCERIN Stopped (08/30/19 1443)  . phenylephrine (NEO-SYNEPHRINE) Adult infusion 40 mcg/min (08/30/19 1700)  . vancomycin       Lab Results  Component Value Date   WBC 10.7 (H) 08/30/2019   HGB 10.6 (L) 08/30/2019   HCT 30.4 (L) 08/30/2019   PLT 137 (L) 08/30/2019   GLUCOSE 241 (H) 08/30/2019   CHOL 265 (H) 09/01/2018   TRIG 664 (H) 09/01/2018   HDL 54 09/01/2018   LDLDIRECT 240.5 09/08/2006   LDLCALC  09/01/2018     Comment:     . LDL cholesterol not calculated. Triglyceride levels greater than 400 mg/dL invalidate calculated LDL results. . Reference  range: <100 . Desirable range <100 mg/dL for primary prevention;   <70 mg/dL for patients with CHD or diabetic patients  with > or = 2 CHD risk factors. 10/31/2018 LDL-C is now calculated using the Martin-Hopkins  calculation, which is a validated novel method providing  better accuracy than the Friedewald equation in the  estimation of LDL-C.  Marland Kitchen et al. Horald Pollen. Lenox Ahr): 2061-2068  (http://education.[facility].com/faq/FAQ164)    ALT 56 (H) 08/28/2019   AST 51 (H) 08/28/2019   NA 145 08/30/2019   K 3.0 (L) 08/30/2019   CL 104 08/30/2019   CREATININE 0.60 08/30/2019   BUN 9 08/30/2019   CO2 23 08/28/2019   TSH 0.98 09/01/2018   INR 1.3 (H) 08/30/2019   HGBA1C 7.0 (H) 08/28/2019   Stable extubated   10/26/2019 MD  Beeper [phone] Office [phone] 08/30/2019 6:16 PM

## 2019-08-30 NOTE — Op Note (Signed)
CARDIOTHORACIC SURGERY OPERATIVE NOTE  Date of Procedure:  08/30/2019  Preoperative Diagnosis: Severe Mitral Regurgitation  Postoperative Diagnosis: Same  Procedure:    Minimally-Invasive Mitral Valve Repair  Complex valvuloplasty including artificial Gore-tex neochord placement x12  Suture plication of posterior leaflet  Sorin Memo 4D Ring Annuloplasty (size 40 mm, catalog # [id number], serial # [id number])    Surgeon: Salvatore Decent. Cornelius Moras, MD  Assistant: Ardelle Balls, PA-C  Anesthesia: Kipp Brood, MD  Operative Findings:  Barlow's type myxomatous degenerative disease  Billowing with multiple elongated primary chordae tendinae from both leaflets without ruptured chordae tendinae  Type II mitral valve dysfunction causing severe mitral regurgitation  Normal left ventricular systolic function  No residual mitral regurgitation after successful valve repair               BRIEF CLINICAL NOTE AND INDICATIONS FOR SURGERY  Patient is a 64 year old moderately obese female with history of mitral valve prolapse and mitral regurgitation, nonobstructive coronary artery disease, hypertension, hypercholesterolemia, type 2 diabetes mellitus, polycystic ovarian disease and endometriosis who has been referred for surgical consultation to discuss treatment options for management of severe symptomatic primary mitral regurgitation.  Patient states that she was first found to have mitral valve prolapse as a teenager when she presented following a syncopal event.  She has been taking beta-blockers for many years because of symptoms of palpitations.  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  She has been followed intermittently all of her life, most recently by Dr. Okey Dupre.  Over the past year the patient has developed significant  decline in functional capacity with worsening exertional shortness of breath and increasing fatigue.  Recent follow-up transthoracic echocardiogram demonstrated significant progression of disease with at least moderate mitral regurgitation but preserved left ventricular systolic function.  Transesophageal echocardiogram was performed August 09, 2019 and confirmed the presence of severe mitral regurgitation.  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  Although there were no obvious ruptured primary chordae tendinae or flail segments there were 3 broad jets directed centrally throughout the left atrium with systolic flow reversal in the left upper pulmonary vein and regurgitant volume estimated between 64 and 111 mL, all consistent with severe mitral regurgitation.  Left ventricular function remain normal with ejection fraction calculated 62%.  There was moderate left atrial enlargement.  No other significant abnormalities were noted.  Diagnostic cardiac catheterization was performed revealing mild to moderate nonobstructive coronary artery disease with normal left and right heart filling pressures and normal cardiac output.  Cardiothoracic surgical consultation was requested.  The patient has been seen in consultation and counseled at length regarding the indications, risks and potential benefits of surgery.  All questions have been answered, and the patient provides full informed consent for the operation as described.    DETAILS OF THE OPERATIVE PROCEDURE  Preparation:  The patient is brought to the operating room on the above mentioned date and central monitoring was established by the anesthesia team including placement of Swan-Ganz catheter through the left internal jugular vein.  A radial arterial line is placed. The patient is placed in the supine position on the operating table.  Intravenous antibiotics are administered. General endotracheal anesthesia is induced  uneventfully. The patient is initially intubated using a dual lumen endotracheal tube.  A Foley catheter is placed.  Baseline transesophageal echocardiogram was performed.  Findings were notable for classical Barlow's  type myxomatous degenerative disease with severe bileaflet prolapse causing severe mitral regurgitation.  Most if not all of the primary chordae tendon any from both leaflets were elongated.  There did not appear to be any ruptured cords or flail segments.  There were 2 broad distinct jets of mitral regurgitation arising on either side of the P2 segment of the posterior leaflet.  There was left atrial enlargement.  There was normal left ventricular systolic function.  The aortic valve appeared normal.  Right ventricular size and function was normal.  There was mild tricuspid regurgitation.  A soft roll is placed behind the patient's left scapula and the neck gently extended and turned to the left.   The patient's right neck, chest, abdomen, both groins, and both lower extremities are prepared and draped in a sterile manner. A time out procedure is performed.   Percutaneous Vascular Access:  Percutaneous arterial and venous access were obtained on the right side.  Using ultrasound guidance the right common femoral vein was cannulated using the Seldinger technique a pair of Perclose vascular closure devises were placed at opposing 30 degree angles in the femoral vein, after which time an 8 French sheath inserted.  The right common femoral artery was cannulated using a micropuncture wire and sheath.  A pair of Perclose vascular closure devices were placed at opposing 30 degree angles in the femoral artery, and a 8 French sheath inserted.  The right internal jugular vein was cannulated  using ultrasound guidance and an 8 French sheath inserted.     Surgical Approach:  A right miniature anterolateral thoracotomy incision is performed. The incision is placed just lateral to and superior to the  right nipple. The pectoralis major muscle is retracted medially and completely preserved. The right pleural space is entered through the 3rd intercostal space. A soft tissue retractor is placed.  Two 11 mm ports are placed through separate stab incisions inferiorly. The right pleural space is insufflated continuously with carbon dioxide gas through the posterior port during the remainder of the operation.  A pledgeted sutures placed through the dome of the right hemidiaphragm and retracted inferiorly to facilitate exposure.  A longitudinal incision is made in the pericardium 3 cm anterior to the phrenic nerve and silk traction sutures are placed on either side of the incision for exposure.   Extracorporeal Cardiopulmonary Bypass and Myocardial Protection:   The patient was heparinized systemically.  The right common femoral vein is cannulated through the venous sheath and a guidewire advanced into the right atrium using TEE guidance.  The femoral vein cannulated using a 22 Fr long femoral venous cannula.  The right common femoral artery is cannulated through the arterial sheath and a guidewire advanced into the descending thoracic aorta using TEE guidance.  Femoral artery is cannulated with a 18 French femoral arterial cannula.  The right internal jugular vein is cannulated through the venous sheath and a guidewire advanced into the right atrium.  The internal jugular vein is cannulated using a 14 French pediatric femoral venous cannula.   Adequate heparinization is verified.   The entire pre-bypass portion of the operation was notable for stable hemodynamics.  Cardiopulmonary bypass was begun.  Vacuum assist venous drainage is utilized. The incision in the pericardium is extended in both directions. Venous drainage and exposure are notably excellent. A retrograde cardioplegia cannula is placed through the right atrium into the coronary sinus using transesophageal echocardiogram guidance.  An antegrade  cardioplegia cannula is placed in the ascending aorta.  The patient is cooled to 32C systemic temperature.  The aortic cross clamp is applied and cardioplegia is delivered initially in an antegrade fashion through the aortic root using modified [place] cold blood cardioplegia ([place] blood cardioplegia protocol).   The initial cardioplegic arrest is rapid with early diastolic arrest.  Myocardial protection was felt to be excellent.   Mitral Valve Repair:  A left atriotomy incision was performed through the interatrial groove and extended partially across the back wall of the left atrium after opening the oblique sinus inferiorly.  The mitral valve is exposed using a self-retaining retractor.  The mitral valve was inspected and notable for classical Barlow's type myxomatous degenerative disease.  The mitral valve is very large with billowing and prolapse involving both leaflets.  There was excessive leaflet tissue although the posterior leaflet was not unusually tall.  There were no ruptured primary chordae tendinae.  There was mild calcification in the middle of the posterior annulus.  Interrupted 2-0 Ethibond horizontal mattress sutures are placed circumferentially around the entire mitral valve annulus. The sutures will ultimately be utilized for ring annuloplasty, and at this juncture there are utilized to suspend the valve symmetrically.  The mitral valve is repaired using multiple artificial Gore-Tex neocords placed around the entire posterior leaflet with suture plication of the clips on either side of the P2 segment of the posterior leaflet.  Artificial neochord placement was performed using Chord-X multi-strand CV-4 Goretex pre-measured loops.  The appropriate cord length was measured from corresponding normal length secondary cords from the P2 segment of the posterior leaflet, and the length of the loops is intentionally decreased 1 size to make certain to pull the posterior leaflet down  low enough into the left ventricle. The papillary muscle suture of the first Chord-X multi-strand suture was placed through the head of the posterior papillary muscle in a horizontal mattress fashion and tied over Teflon felt pledgets. Each of the three pre-measured loops were then reimplanted into the free margin of the P2 and P3 segments of the posterior leaflet.  The papillary muscle suture of a second Chord-X multi-strand suture was placed through the head of the anterior papillary muscle in a horizontal mattress fashion and tied over Teflon felt pledgets. Each of the three pre-measured loops were then reimplanted into the free margin of the P1 and P2 segments of the posterior leaflet.    Suture plication of the posterior leaflet is performed using everting 4-0 Prolene sutures placed in the cleft between the P1 and the P2 segment of the posterior leaflet and between the P2 and P3 segment of the posterior leaflet.  The valve was tested with saline and appeared competent even without ring annuloplasty complete. The valve was sized to a 40 mm annuloplasty ring, based upon the transverse distance between the left and right commissures and the height of the anterior leaflet, corresponding to a size just slightly larger than the overall surface area of the anterior leaflet.  A Sorin Memo 4D annuloplasty ring (size 80mm, catalog #[id number], serial [id number]) was secured in place uneventfully. All ring sutures were secured using a Cor-knot device.    The valve was tested with saline and appeared competent. There is no residual leak. There was a broad, symmetrical line of coaptation of the anterior and posterior leaflet which was confirmed using the blue ink test.  Rewarming is begun.   Procedure Completion:  The atriotomy was closed using a 2-layer closure of running 3-0 Prolene suture after placing a sump drain  across the mitral valve to serve as a left ventricular vent.  One final dose of warm "reanimation dose"  cardioplegia was administered through the aortic root.  The aortic cross clamp was removed after a total cross clamp time of 115 minutes.  Epicardial pacing wires are fixed to the inferior wall of the right ventricule and to the right atrial appendage. The patient is rewarmed to 37C temperature. The left ventricular vent and antegrade cardioplegia cannula are removed. The patient is weaned and disconnected from cardiopulmonary bypass.  The patient's rhythm at separation from bypass was AV paced.  The patient was weaned from bypass without any inotropic support. Total cardiopulmonary bypass time for the operation was 155 minutes.  Followup transesophageal echocardiogram performed after separation from bypass revealed a well-seated annuloplasty ring in the mitral position with a normal functioning mitral valve. There was no residual leak.  Left ventricular function was unchanged from preoperatively.  The mean gradient across the mitral valve was estimated to be 3 mmHg.  The femoral arterial and venous cannulas were removed and all Perclose sutures secured.  Manual pressure was maintained while Protamine was administered.  The right internal jugular cannula was removed and manual pressure held on the neck and groin for 15 minutes.  Single lung ventilation was begun. The atriotomy closure was inspected for hemostasis. The pericardial sac was drained using a 28 French Bard drain placed through the anterior port incision.  The right pleural space is irrigated with saline solution and inspected for hemostasis.  The right pleural space was drained using a 28 French Bard drain placed through the posterior port incision. The miniature thoracotomy incision was closed in multiple layers in routine fashion. The right groin incision was inspected for hemostasis and closed in multiple layers in routine fashion.  The post-bypass portion of the operation was notable for stable rhythm and hemodynamics.  No blood products  were administered during the operation.   Disposition:  The patient tolerated the procedure well.  The patient was extubated in the operating room and subsequently transported to the surgical intensive care unit in stable condition. There were no intraoperative complications. All sponge instrument and needle counts are verified correct at completion of the operation.     Salvatore Decent. Cornelius Moras MD 08/30/2019 1:43 PM

## 2019-08-30 NOTE — Anesthesia Procedure Notes (Signed)
Central Venous Catheter Insertion Performed by: Kipp Brood, MD, anesthesiologist Start/End1/02/2020 6:50 AM, 08/30/2019 7:00 AM Patient location: Pre-op. Preanesthetic checklist: patient identified, IV checked, site marked, risks and benefits discussed, surgical consent, monitors and equipment checked, pre-op evaluation, timeout performed and anesthesia consent Lidocaine 1% used for infiltration and patient sedated Hand hygiene performed  and maximum sterile barriers used  Catheter size: 8.5 Fr PA cath was placed.Sheath introducer Swan type:thermodilution Procedure performed using ultrasound guided technique. Ultrasound Notes:anatomy identified, needle tip was noted to be adjacent to the nerve/plexus identified, no ultrasound evidence of intravascular and/or intraneural injection and image(s) printed for medical record Attempts: 1 Following insertion, line sutured and dressing applied. Post procedure assessment: blood return through all ports, free fluid flow and no air  Patient tolerated the procedure well with no immediate complications.

## 2019-08-30 NOTE — Brief Op Note (Addendum)
08/30/2019  12:23 PM  PATIENT:  Bonnie Ryan  64 y.o. female  PRE-OPERATIVE DIAGNOSIS:  1. MITRAL VALVE PROLAPSE 2. 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)  POST-OPERATIVE DIAGNOSIS:   1. MITRAL VALVE PROLAPSE 2. 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)  PROCEDURE:  TRANSESOPHAGEAL ECHOCARDIOGRAM (TEE),  MINIMALLY INVASIVE MITRAL VALVE REPAIR (MVR) (USING MODEL # 4DM, MEMO 4D, SERIAL # [id number], SIZE 40)  with COMPLEX VALVULOPLASTY including Neo CHORDS for POSTERIOR LEAFLET (P2)  SURGEON:  Surgeon(s) and Role:    Purcell Nails, MD - Primary  PHYSICIAN ASSISTANT: Doree Fudge PA-C  ASSISTANTS: Benay Spice RNFA  ANESTHESIA:   general  EBL:  Per perfusion record  BLOOD and PRODUCTS ADMINISTERED:Per anesthesia and perfusion record  DRAINS: Chest tubes placed in the right pleural space and mediastinum   COUNTS CORRECT:  YES  DICTATION: .Dragon Dictation  PLAN OF CARE: Admit to inpatient   PATIENT DISPOSITION:  ICU - intubated and hemodynamically stable.   Delay start of Pharmacological VTE agent (>24hrs) due to surgical blood loss or risk of bleeding: yes  BASELINE WEIGHT: 85.8 kg  Purcell Nails, MD 08/30/2019 1:43 PM

## 2019-08-30 NOTE — Anesthesia Procedure Notes (Signed)
Procedure Name: Intubation Date/Time: 08/30/2019 8:13 AM Performed by: Colin Benton, CRNA Pre-anesthesia Checklist: Patient identified, Emergency Drugs available, Suction available and Patient being monitored Patient Re-evaluated:Patient Re-evaluated prior to induction Oxygen Delivery Method: Circle system utilized Preoxygenation: Pre-oxygenation with 100% oxygen Induction Type: IV induction Ventilation: Mask ventilation without difficulty and Oral airway inserted - appropriate to patient size Laryngoscope Size: Mac and 3 Grade View: Grade II Tube type: Oral Endobronchial tube: Left, Double lumen EBT, EBT position confirmed by auscultation and EBT position confirmed by fiberoptic bronchoscope and 35 Fr Number of attempts: 1 Airway Equipment and Method: Stylet Placement Confirmation: ETT inserted through vocal cords under direct vision,  positive ETCO2 and breath sounds checked- equal and bilateral Secured at: 29 cm Tube secured with: Tape Dental Injury: Teeth and Oropharynx as per pre-operative assessment

## 2019-08-30 NOTE — Interval H&P Note (Signed)
History and Physical Interval Note:  08/30/2019 6:36 AM  Bonnie Ryan  has presented today for surgery, with the diagnosis of MR.  The various methods of treatment have been discussed with the patient and family. After consideration of risks, benefits and other options for treatment, the patient has consented to  Procedure(s): MINIMALLY INVASIVE MITRAL VALVE REPAIR (MVR) (Right) TRANSESOPHAGEAL ECHOCARDIOGRAM (TEE) (N/A) as a surgical intervention.  The patient's history has been reviewed, patient examined, no change in status, stable for surgery.  I have reviewed the patient's chart and labs.  Questions were answered to the patient's satisfaction.     Purcell Nails

## 2019-08-30 NOTE — Transfer of Care (Signed)
Immediate Anesthesia Transfer of Care Note  Patient: Bonnie Ryan  Procedure(s) Performed: MINIMALLY INVASIVE MITRAL VALVE REPAIR (MVR) USING MEMO 4D SIZE 40 (Right Chest) TRANSESOPHAGEAL ECHOCARDIOGRAM (TEE) (N/A )  Patient Location: ICU  Anesthesia Type:General  Level of Consciousness: awake, alert , oriented and patient cooperative  Airway & Oxygen Therapy: Patient Spontanous Breathing and Patient connected to face mask oxygen  Post-op Assessment: Report given to RN, Post -op Vital signs reviewed and stable and Patient moving all extremities X 4  Post vital signs: Reviewed and stable  Last Vitals:  Vitals Value Taken Time  BP    Temp    Pulse 84 08/30/19 1449  Resp 23 08/30/19 1449  SpO2 100 % 08/30/19 1449  Vitals shown include unvalidated device data.  Last Pain:  Vitals:   08/30/19 0558  TempSrc: Oral      Patients Stated Pain Goal: 3 (08/30/19 5483)  Complications: No apparent anesthesia complications

## 2019-08-30 NOTE — Progress Notes (Signed)
FIRST CALL TO [facility]. 45 MIN. ETA.

## 2019-08-31 ENCOUNTER — Inpatient Hospital Stay (HOSPITAL_COMMUNITY): Payer: Managed Care, Other (non HMO)

## 2019-08-31 ENCOUNTER — Encounter: Payer: Self-pay | Admitting: *Deleted

## 2019-08-31 LAB — CBC
HCT: 27.2 % — ABNORMAL LOW (ref 36.0–46.0)
HCT: 25.9 % — ABNORMAL LOW (ref 36.0–46.0)
Hemoglobin: 9 g/dL — ABNORMAL LOW (ref 12.0–15.0)
Hemoglobin: 8.7 g/dL — ABNORMAL LOW (ref 12.0–15.0)
MCH: 32.5 pg (ref 26.0–34.0)
MCH: 33.1 pg (ref 26.0–34.0)
MCHC: 33.1 g/dL (ref 30.0–36.0)
MCHC: 33.6 g/dL (ref 30.0–36.0)
MCV: 98.2 fL (ref 80.0–100.0)
MCV: 98.5 fL (ref 80.0–100.0)
Platelets: 133 10*3/uL — ABNORMAL LOW (ref 150–400)
Platelets: 107 10*3/uL — ABNORMAL LOW (ref 150–400)
RBC: 2.77 MIL/uL — ABNORMAL LOW (ref 3.87–5.11)
RBC: 2.63 MIL/uL — ABNORMAL LOW (ref 3.87–5.11)
RDW: 14.6 % (ref 11.5–15.5)
RDW: 14.9 % (ref 11.5–15.5)
WBC: 9.2 10*3/uL (ref 4.0–10.5)
WBC: 11.3 10*3/uL — ABNORMAL HIGH (ref 4.0–10.5)
nRBC: 0 % (ref 0.0–0.2)
nRBC: 0 % (ref 0.0–0.2)

## 2019-08-31 LAB — MAGNESIUM
Magnesium: 2.6 mg/dL — ABNORMAL HIGH (ref 1.7–2.4)
Magnesium: 2.4 mg/dL (ref 1.7–2.4)

## 2019-08-31 LAB — BASIC METABOLIC PANEL
Anion gap: 8 (ref 5–15)
Anion gap: 10 (ref 5–15)
BUN: 9 mg/dL (ref 8–23)
BUN: 13 mg/dL (ref 8–23)
CO2: 24 mmol/L (ref 22–32)
CO2: 20 mmol/L — ABNORMAL LOW (ref 22–32)
Calcium: 7.6 mg/dL — ABNORMAL LOW (ref 8.9–10.3)
Calcium: 7.4 mg/dL — ABNORMAL LOW (ref 8.9–10.3)
Chloride: 107 mmol/L (ref 98–111)
Chloride: 101 mmol/L (ref 98–111)
Creatinine, Ser: 0.76 mg/dL (ref 0.44–1.00)
Creatinine, Ser: 1.11 mg/dL — ABNORMAL HIGH (ref 0.44–1.00)
GFR calc Af Amer: >60 mL/min (ref >60)
GFR calc Af Amer: >60 mL/min (ref >60)
GFR calc non Af Amer: >60 mL/min (ref >60)
GFR calc non Af Amer: 53 mL/min — ABNORMAL LOW (ref >60)
Glucose, Bld: 154 mg/dL — ABNORMAL HIGH (ref 70–99)
Glucose, Bld: 238 mg/dL — ABNORMAL HIGH (ref 70–99)
Potassium: 4 mmol/L (ref 3.5–5.1)
Potassium: 4 mmol/L (ref 3.5–5.1)
Sodium: 139 mmol/L (ref 135–145)
Sodium: 131 mmol/L — ABNORMAL LOW (ref 135–145)

## 2019-08-31 LAB — GLUCOSE, CAPILLARY
Glucose-Capillary: 121 mg/dL — ABNORMAL HIGH (ref 70–99)
Glucose-Capillary: 127 mg/dL — ABNORMAL HIGH (ref 70–99)
Glucose-Capillary: 128 mg/dL — ABNORMAL HIGH (ref 70–99)
Glucose-Capillary: 132 mg/dL — ABNORMAL HIGH (ref 70–99)
Glucose-Capillary: 138 mg/dL — ABNORMAL HIGH (ref 70–99)
Glucose-Capillary: 138 mg/dL — ABNORMAL HIGH (ref 70–99)
Glucose-Capillary: 145 mg/dL — ABNORMAL HIGH (ref 70–99)
Glucose-Capillary: 164 mg/dL — ABNORMAL HIGH (ref 70–99)
Glucose-Capillary: 186 mg/dL — ABNORMAL HIGH (ref 70–99)
Glucose-Capillary: 193 mg/dL — ABNORMAL HIGH (ref 70–99)
Glucose-Capillary: 204 mg/dL — ABNORMAL HIGH (ref 70–99)
Glucose-Capillary: 222 mg/dL — ABNORMAL HIGH (ref 70–99)
Glucose-Capillary: 241 mg/dL — ABNORMAL HIGH (ref 70–99)
Glucose-Capillary: 89 mg/dL (ref 70–99)

## 2019-08-31 MED ORDER — ROSUVASTATIN CALCIUM 20 MG PO TABS
20.0000 mg | ORAL_TABLET | Freq: Every day | ORAL | Status: DC
  Administered 2019-09-02 – 2019-09-04 (×3): 20 mg via ORAL
  Filled 2019-08-31 (×3): qty 1

## 2019-08-31 MED ORDER — KETOROLAC TROMETHAMINE 15 MG/ML IJ SOLN
15.0000 mg | Freq: Four times a day (QID) | INTRAMUSCULAR | Status: AC
  Administered 2019-08-31 – 2019-09-01 (×5): 15 mg via INTRAVENOUS
  Filled 2019-08-31 (×5): qty 1

## 2019-08-31 MED ORDER — INSULIN ASPART 100 UNIT/ML ~~LOC~~ SOLN
0.0000 [IU] | SUBCUTANEOUS | Status: DC
  Administered 2019-08-31 (×3): 8 [IU] via SUBCUTANEOUS
  Administered 2019-08-31: 4 [IU] via SUBCUTANEOUS
  Administered 2019-09-01: 2 [IU] via SUBCUTANEOUS
  Administered 2019-09-01: 12 [IU] via SUBCUTANEOUS
  Administered 2019-09-01 (×2): 8 [IU] via SUBCUTANEOUS
  Administered 2019-09-01 – 2019-09-02 (×3): 2 [IU] via SUBCUTANEOUS
  Administered 2019-09-02: 4 [IU] via SUBCUTANEOUS
  Administered 2019-09-02: 2 [IU] via SUBCUTANEOUS
  Administered 2019-09-02: 4 [IU] via SUBCUTANEOUS
  Administered 2019-09-02: 8 [IU] via SUBCUTANEOUS
  Administered 2019-09-02: 2 [IU] via SUBCUTANEOUS
  Administered 2019-09-03: 4 [IU] via SUBCUTANEOUS
  Administered 2019-09-03: 8 [IU] via SUBCUTANEOUS
  Administered 2019-09-03 (×2): 4 [IU] via SUBCUTANEOUS
  Administered 2019-09-03 – 2019-09-04 (×3): 2 [IU] via SUBCUTANEOUS

## 2019-08-31 MED ORDER — WARFARIN SODIUM 2.5 MG PO TABS
2.5000 mg | ORAL_TABLET | Freq: Every day | ORAL | Status: DC
  Administered 2019-08-31 – 2019-09-02 (×3): 2.5 mg via ORAL
  Filled 2019-08-31 (×3): qty 1

## 2019-08-31 MED ORDER — INSULIN DETEMIR 100 UNIT/ML ~~LOC~~ SOLN
20.0000 [IU] | Freq: Once | SUBCUTANEOUS | Status: AC
  Administered 2019-08-31: 20 [IU] via SUBCUTANEOUS
  Filled 2019-08-31: qty 0.2

## 2019-08-31 MED ORDER — ASPIRIN EC 81 MG PO TBEC
81.0000 mg | DELAYED_RELEASE_TABLET | Freq: Every day | ORAL | Status: DC
  Administered 2019-09-01 – 2019-09-05 (×5): 81 mg via ORAL
  Filled 2019-08-31 (×5): qty 1

## 2019-08-31 MED ORDER — ~~LOC~~ CARDIAC SURGERY, PATIENT & FAMILY EDUCATION: Freq: Once | Status: DC

## 2019-08-31 MED ORDER — ASPIRIN EC 325 MG PO TBEC
325.0000 mg | DELAYED_RELEASE_TABLET | Freq: Every day | ORAL | Status: AC
  Administered 2019-08-31: 325 mg via ORAL
  Filled 2019-08-31: qty 1

## 2019-08-31 MED ORDER — INSULIN DETEMIR 100 UNIT/ML ~~LOC~~ SOLN
20.0000 [IU] | Freq: Every day | SUBCUTANEOUS | Status: DC
  Administered 2019-09-02 – 2019-09-05 (×4): 20 [IU] via SUBCUTANEOUS
  Filled 2019-08-31 (×5): qty 0.2

## 2019-08-31 MED ORDER — ENOXAPARIN SODIUM 30 MG/0.3ML ~~LOC~~ SOLN
30.0000 mg | Freq: Every day | SUBCUTANEOUS | Status: DC
  Administered 2019-09-01 – 2019-09-04 (×4): 30 mg via SUBCUTANEOUS
  Filled 2019-08-31 (×4): qty 0.3

## 2019-08-31 MED ORDER — WARFARIN - PHYSICIAN DOSING INPATIENT: Freq: Every day | Status: DC

## 2019-08-31 MED FILL — Sodium Chloride IV Soln 0.9%: INTRAVENOUS | Qty: 2000 | Status: AC

## 2019-08-31 MED FILL — Sodium Bicarbonate IV Soln 8.4%: INTRAVENOUS | Qty: 50 | Status: AC

## 2019-08-31 MED FILL — Mannitol IV Soln 20%: INTRAVENOUS | Qty: 500 | Status: AC

## 2019-08-31 MED FILL — Heparin Sodium (Porcine) Inj 1000 Unit/ML: INTRAMUSCULAR | Qty: 30 | Status: AC

## 2019-08-31 MED FILL — Potassium Chloride Inj 2 mEq/ML: INTRAVENOUS | Qty: 40 | Status: AC

## 2019-08-31 MED FILL — Lidocaine HCl Local Preservative Free (PF) Inj 2%: INTRAMUSCULAR | Qty: 15 | Status: AC

## 2019-08-31 MED FILL — Electrolyte-R (PH 7.4) Solution: INTRAVENOUS | Qty: 4000 | Status: AC

## 2019-08-31 NOTE — Discharge Summary (Signed)
Physician Discharge Summary       [address] [place].[address]       [name] [address]             [phone]    Patient ID: Bonnie Ryan MRN: [id number] DOB/AGE: 1956/05/15 64 y.o.  Admit date: 08/30/2019 Discharge date: 09/05/2019  Admission Diagnoses: Severe mitral regurgitation  Discharge Diagnoses:  1. S/P minimally invasive mitral valve repair 2. ABL anemia 3. Paroxysmal atrial fibrillation 4. History of essential hypertension 5. History of diabetes (HCC) 6. History of non obstructive CAD  7. History of hypercholesteremia 8. History of UTI (urinary tract infection)  9. History of endometriosis 10. History of diabetes mellitus   Procedure (s):   Minimally-Invasive Mitral Valve Repair             Complex valvuloplasty including artificial Gore-tex neochord placement x12             Suture plication of posterior leaflet             Sorin Memo 4D Ring Annuloplasty (size 40 mm, catalog # [id number], serial # [id number]) by Dr. Cornelius Moras on 01/07/20201  History of Presenting Illness: Patient is a 64 year old moderately obese female with history of mitral valve prolapse and mitral regurgitation, nonobstructive coronary artery disease, hypertension, hypercholesterolemia, type 2 diabetes mellitus, polycystic ovarian disease and endometriosis who has been referred for surgical consultation to discuss treatment options for management of severe symptomatic primary mitral regurgitation.  Patient states that she was first found to have mitral valve prolapse as a teenager when she presented following a syncopal event.  She has been taking beta-blockers for many years because of symptoms of palpitations.  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  She has been followed intermittently all of her life, most recently by Dr. Okey Dupre.  Over the  past year the patient has developed significant decline in functional capacity with worsening exertional shortness of breath and increasing fatigue.  Recent follow-up transthoracic echocardiogram demonstrated significant progression of disease with at least moderate mitral regurgitation but preserved left ventricular systolic function.  Transesophageal echocardiogram was performed August 09, 2019 and confirmed the presence of severe mitral regurgitation.  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  Although there were no obvious ruptured primary chordae tendinae or flail segments there were 3 broad jets directed centrally throughout the left atrium with systolic flow reversal in the left upper pulmonary vein and regurgitant volume estimated between 64 and 111 mL, all consistent with severe mitral regurgitation.  Left ventricular function remain normal with ejection fraction calculated 62%.  There was moderate left atrial enlargement.  No other significant abnormalities were noted.  Diagnostic cardiac catheterization was performed revealing mild to moderate nonobstructive coronary artery disease with normal left and right heart filling pressures and normal cardiac output.  Cardiothoracic surgical consultation was requested.  Patient is single and lives locally in [place] with her mother.  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  The patient remains reasonably active physically and entirely functionally independent.  However, she complains of gradual progressive decline in her exercise tolerance over the last several years, most notably over the past year.  She states that she gets short of breath with moderate and occasional low level activity.  She occasionally wakes up from her sleep at night and she cannot  lay flat in bed, typically sleeping on 4 pillows.  She has some occasional tightness across her chest that is  vague and not necessarily related to exertion.  She has occasional palpitations and mild dizzy spells without any history of prolonged tachypalpitations or syncope.  The patient and her daughter-in-law were counseled at length regarding the indications, risks and potential benefits of mitral valve repair.  The rationale for elective surgery has been explained, including a comparison between surgery and continued medical therapy with close follow-up.  The likelihood of successful and durable mitral valve repair has been discussed with particular reference to the findings of their recent echocardiogram.  Based upon these findings and previous experience, I have quoted them a greater than 95 percent likelihood of successful valve repair with less than 1 percent risk of mortality or major morbidity.  Alternative surgical approaches have been discussed including a comparison between conventional sternotomy and minimally-invasive techniques.  The relative risks and benefits of each have been reviewed as they pertain to the patient's specific circumstances, and expectations for the patient's postoperative convalescence has been discussed.  The patient desires to proceed with surgery as soon as possible.  In the unlikely event that the patient's valve cannot be successfully repaired, we discussed the possibility of replacing the mitral valve using a mechanical prosthesis with the attendant need for long-term anticoagulation versus the alternative of replacing it using a bioprosthetic tissue valve with its potential for late structural valve deterioration and failure, depending upon the patient's longevity.  The patient specifically requests that if the mitral valve must be replaced that it be done using a mechanical valve valve. Potential risks, benefits, and complications of the surgery were discussed with the patient and she decided to proceed with surgery. Pre operative carotid duplex US showed no significantly  internal carotid artery stenosis bilaterally. She underwent a minimally invasive mitral valve  Brief Hospital Course:  The patient was extubated the evening of surgery without difficulty. She remained afebrile and hemodynamically stable. Theone Murdoch, a line, and foley were removed early in the post operative course. She was weaned off Neo Synephrine drip. She was volume over loaded and diuresed. She had ABL anemia. She did not require a post op transfusion. Last H and H was 8.7 and 26.6. She was started on Trinsicon. She was started on Coumadin and PT and INR were monitored daily. She was weaned off the insulin drip.  The patient's glucose remained well controlled. Metformin was restarted once she was tolerating a diet well. The patient's HGA1C pre op was 7. The patient was felt surgically stable for transfer from the ICU to [facility] for further convalescence on 01/09;however, she went into a fib so transfer was held. She was put on Amiodarone, she converted to sinus rhythm, and because of prolonged QTc, Amiodarone was stopped.  Medications that prolonged the QTc were also stopped. Her QTc eventually decreased to 360. She was transferred to 4E for further convalescence on 01/10. Chest tube output was decreased so chest tubes were removed on 01/11. Follow up chest x ray showed no pneumothorax, stable cardiomegaly. She was put on low dose Lopressor. She continues to progress with cardiac rehab. She was ambulating on room air. She has been tolerating a diet and has had a bowel movement. Epicardial pacing wires were removed on 01/11. Chest tube sutures will be removed in the office after discharge.  As discussed with Dr. Johney Frame, patient will be discharged on Amiodarone 200 mg daily and will need a  12 lead EKG in the atrial fibrillation clinic in one week. As of 01/13, the patient is on 5 mg of Coumadin and her latest INR is 1.2.  As discussed with Dr. Cornelius Moras, patient will be discharged on 5 mg of Coumadin and her PT and INR  will be checked on Friday 01/15. The patient is felt surgically stable for discharge today.   Latest Vital Signs: Blood pressure 134/72, pulse 92, temperature 97.8 F (36.6 C), temperature source Oral, resp. rate (!) 21, height 5\' 7"  (1.702 m), weight 88.5 kg, SpO2 93 %.  Physical Exam: Cardiovascular: RRR, no murmur Pulmonary: Slightly diminished bibasilar breath sounds R>L Abdomen: Soft, non tender, bowel sounds present. Extremities: Mild bilateral lower extremity edema. Wounds: Clean and dry.  No erythema or signs of infection.  Discharge Condition: Stable and discharged to home.  Recent laboratory studies:  Lab Results  Component Value Date   WBC 6.2 09/04/2019   HGB 8.7 (L) 09/04/2019   HCT 26.6 (L) 09/04/2019   MCV 99.3 09/04/2019   PLT 150 09/04/2019   Lab Results  Component Value Date   NA 139 09/04/2019   K 4.3 09/04/2019   CL 103 09/04/2019   CO2 27 09/04/2019   CREATININE 0.85 09/04/2019   GLUCOSE 127 (H) 09/04/2019      Diagnostic Studies: DG Chest 2 View in am  Result Date: 09/04/2019 CLINICAL DATA:  Atelectasis.  Mitral valve replacement. EXAM: CHEST - 2 VIEW COMPARISON:  09/01/2019. FINDINGS: Interim removal of left IJ sheath, right chest tube, and mediastinal drainage catheter. Cardiac valve replacement. Stable cardiomegaly, no pulmonary venous congestion. Stable atelectatic changes right lung base. Tiny right pleural effusion. No pneumothorax. IMPRESSION: 1. Interim removal of left IJ sheath, right chest tube, and mediastinal drainage catheter. No pneumothorax. 2. Cardiac valve replacement. Stable cardiomegaly. No pulmonary venous congestion. 3. Stable atelectatic changes right lung base. Tiny right pleural effusion. Electronically Signed   By: Maisie Fus  Register   On: 09/04/2019 07:10   DG Chest 2 View  Result Date: 08/28/2019 CLINICAL DATA:  Pre-admit for heart surgery hx Mitral valve insufficiency,htn,dm,cad EXAM: CHEST - 2 VIEW COMPARISON:  CT 08/27/2019  FINDINGS: Linear scarring or atelectasis in the left mid lung as before. Lungs otherwise clear. Heart size normal.  Aortic Atherosclerosis (ICD10-170.0). No effusion. No pneumothorax. Visualized bones unremarkable. Cholecystectomy clips. IMPRESSION: No acute cardiopulmonary disease. Electronically Signed   By: Corlis Leak M.D.   On: 08/28/2019 09:04   CARDIAC CATHETERIZATION  Result Date: 08/09/2019 Conclusions: 1. Mild to moderate, non-obstructive coronary artery disease. 2. Normal left and right heart filling pressure. 3. Normal Fick cardiac output/index.  Recommendations: 1. Decrease furosemide to 20 mg daily as needed for edema/weight gain.  Additional potassium supplementation given prior to discharge home today. 2. Proceed with cardiac surgery evaluation for mitral valve intervention. 3. Medical therapy and risk factor modification to prevent progression of coronary artery disease. Yvonne Kendall, MD [facility]   DG Chest Port 1 View  Result Date: 09/01/2019 CLINICAL DATA:  Atelectasis. Improving shortness of breath. History of minimally invasive mitral valve repair. EXAM: PORTABLE CHEST 1 VIEW COMPARISON:  08/31/2019 FINDINGS: Sequelae of mitral valve repair are again identified. Swan-Ganz catheter has been removed. A left jugular sheath remains in place. A right chest tube and pericardial drain remain in place. The cardiac silhouette remains enlarged. Streaky bibasilar opacities have not significantly changed. No sizable pleural effusion or pneumothorax is identified. IMPRESSION: 1. Interval removal of Swan-Ganz catheter. Other support devices as above.  No pneumothorax. 2. Unchanged bibasilar atelectasis. Electronically Signed   By: Sebastian Ache M.D.   On: 09/01/2019 09:08   DG Chest Port 1 View  Result Date: 08/31/2019 CLINICAL DATA:  Mitral valve replacement 08/30/2019, chest pain and shortness of breath EXAM: PORTABLE CHEST 1 VIEW COMPARISON:  Radiograph 08/30/2019 FINDINGS: *Epicardial  pacer device overlies the chest wall with leads projecting over the mediastinum. *A Swan-Ganz catheter tip terminates at the level of the main pulmonary artery. *A right pleural drain remains in place. *Mitral valve replacement is again seen in stable position. Diminished lung volumes with streaky basilar opacities favoring atelectasis. More focal bandlike opacity in the right mid lung is compatible with subsegmental atelectasis. There is some increased postoperative mediastinal widening though this may be accentuated by portable technique and low volumes. No other acute interval change. No acute osseous or soft tissue abnormality. Cholecystectomy clips in the right upper quadrant. IMPRESSION: 1. Support devices as above. 2. Low lung volumes with streaky bibasilar opacities favoring atelectasis. 3. Increased postoperative mediastinal widening. Possibly accentuated by diminished volumes and portable technique, finding should be correlated with patient's clinical status. Electronically Signed   By: Kreg Shropshire M.D.   On: 08/31/2019 06:34   DG Chest Port 1 View  Result Date: 08/30/2019 CLINICAL DATA:  64 year old female postoperative day zero minimally invasive mitral valve repair. EXAM: PORTABLE CHEST 1 VIEW COMPARISON:  1404 hours today and earlier. FINDINGS: Portable AP semi upright view at 1505 hours. Extubated. Right IJ approach Swan-Ganz catheter is stable. Chest or mediastinal tubes remain in place. Larger lung volumes. Stable cardiac size and mediastinal contours. Streaky perihilar atelectasis has mildly decreased, remains greater on the right. No pneumothorax identified. No pulmonary edema or pleural effusion. IMPRESSION: 1. Extubated. Otherwise stable lines and tubes. 2. Mildly improved perihilar atelectasis. No pneumothorax or pleural effusion. Electronically Signed   By: Odessa Fleming M.D.   On: 08/30/2019 15:18   DG Chest Portable 1 View  Result Date: 08/30/2019 CLINICAL DATA:  Status post cardiac surgery  EXAM: PORTABLE CHEST 1 VIEW COMPARISON:  08/28/2019 FINDINGS: Dual lumen endotracheal tube with the tip in the left mainstem bronchus. Swan-Ganz catheter with the tip projecting over the right ventricular outflow tract. Mediastinal drains are noted. Bilateral lower lobe airspace disease, right greater than left, consistent with atelectasis. No pleural effusion or pneumothorax. Stable cardiomediastinal silhouette. Interval valvuloplasty. IMPRESSION: Dual lumen endotracheal tube with the tip in the left mainstem bronchus. Patient has been extubated at this time following the x-ray. Swan-Ganz catheter with the tip projecting over the right ventricular outflow tract. Mediastinal drains are noted. Bilateral lower lobe atelectasis, right greater than left. Electronically Signed   By: Elige Ko   On: 08/30/2019 14:25   CT ANGIO CHEST AORTA W/CM & OR WO/CM  Result Date: 08/27/2019 CLINICAL DATA:  Mitral valve prolapse, mitral regurgitation preoperative assessment prior to planned mitral valve repair. EXAM: CT ANGIOGRAPHY CHEST, ABDOMEN AND PELVIS TECHNIQUE: Multidetector CT imaging through the chest, abdomen and pelvis was performed using the standard protocol during bolus administration of intravenous contrast. Multiplanar reconstructed images and MIPs were obtained and reviewed to evaluate the vascular anatomy. CONTRAST:  OMNIPAQUE IOHEXOL 350 MG/ML SOLN COMPARISON:  None. FINDINGS: CTA CHEST FINDINGS Cardiovascular: The left atrium appears dilated. No pericardial fluid. Calcified plaque is noted of the coronary arteries in a 3 vessel distribution. The thoracic aorta is normal in caliber and demonstrates mild atherosclerosis at the level of the arch and descending thoracic aorta. Calcified plaque  at the origin of the left subclavian artery causes approximately 40% maximal stenosis. Eccentric noncalcified plaque in the innominate artery causes approximately 40-50% stenosis. Other visualized great vessels  demonstrate normal patency and are moderately tortuous. Central pulmonary arteries are normal in caliber. Mediastinum/Nodes: No enlarged mediastinal, hilar, or axillary lymph nodes. Thyroid gland, trachea, and esophagus demonstrate no significant findings. There is a small hiatal hernia. Lungs/Pleura: Region scarring and atelectasis in the lingula. There is no evidence of pulmonary edema, consolidation, pneumothorax, nodule or pleural fluid. Musculoskeletal: No chest wall abnormality. No acute or significant osseous findings. Review of the MIP images confirms the above findings. CTA ABDOMEN AND PELVIS FINDINGS VASCULAR Aorta: The abdominal aorta demonstrates scattered calcified and noncalcified atherosclerotic plaque without evidence of aneurysmal disease or stenosis. Celiac: Normally patent. Normally patent branch vessels. SMA: Normally patent. Normally patent visualized branch vessels. Renals: Bilateral single renal arteries demonstrate mild atherosclerosis without evidence of stenosis. IMA: Normally patent. Inflow: Calcified plaque in both common iliac arteries without evidence significant stenosis or aneurysmal disease. Bilateral external and internal iliac arteries demonstrate normal patency. Mild plaque in both common femoral arteries without evidence of obstructive disease. The left femoral bifurcation is high and located just above the inguinal ligament at the level of the acetabular roof. The right femoral bifurcation is normal in position at the level of the right femoral neck. Review of the MIP images confirms the above findings. NON-VASCULAR Hepatobiliary: Diffuse hepatic steatosis without evidence focal lesion or overt cirrhosis. The gallbladder has been removed. No biliary ductal dilatation identified. Pancreas: Unremarkable. No pancreatic ductal dilatation or surrounding inflammatory changes. Spleen: Normal in size without focal abnormality. Adrenals/Urinary Tract: Adrenal glands are unremarkable.  Kidneys are normal, without renal calculi, focal lesion, or hydronephrosis. Bladder is unremarkable. Stomach/Bowel: Bowel shows no evidence of obstruction, ileus or inflammation. No obvious focal lesions. No free air. Lymphatic: No enlarged lymph nodes identified. Reproductive: Status post hysterectomy. No adnexal masses. Other: No abdominal wall hernia or abnormality. No abdominopelvic ascites. Musculoskeletal: Degenerative disc disease with disc space narrowing at L4-5. No fractures or bony lesions identified. Review of the MIP images confirms the above findings. IMPRESSION: 1. Mild atherosclerosis of the thoracic and abdominal aorta without evidence of aneurysmal disease or stenosis. 2. 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 3. Calcified atherosclerosis of the coronary arteries in a 3 vessel distribution. 4. Left atrial enlargement. 5. Small hiatal hernia. 6. Left femoral bifurcation is high and located just above the inguinal ligament at the level of the acetabular roof. The right femoral bifurcation is normal in position at the level of the right femoral neck. 7. Hepatic steatosis without focal lesion or overt cirrhosis. Electronically Signed   By: Irish Lack M.D.   On: 08/27/2019 08:45   ECHO TEE  Result Date: 08/09/2019   TRANSESOPHOGEAL ECHO REPORT   Patient Name:   ATARAH CADOGAN Date of Exam: 08/09/2019 Medical Rec #:  [id number]       Height:       67.0 in Accession #:    [id number]      Weight:       187.5 lb Date of Birth:  1956/02/16      BSA:          1.97 m Patient Age:    63 years        BP:           140/113 mmHg Patient Gender: F  HR:           72 bpm. Exam Location:  Inpatient  Procedure: Transesophageal Echo, Cardiac Doppler, Color Doppler and 3D Echo                             MODIFIED REPORT This report was modified by Lennie OdorWesley O'Neal MD on 08/09/2019 due to error. Indications:     MVP,  mitral regurgiation  History:         Patient has prior history of Echocardiogram examinations, most                  recent 08/03/2019.  Sonographer:     Lavenia AtlasBrooke Strickland Referring Phys:  [phone] [name] Diagnosing Phys: Lennie OdorWesley O'Neal MD  PROCEDURE: The transesophogeal probe was passed through the esophogus of the patient. The patient developed no complications during the procedure. IMPRESSIONS  1. There is severe mitral valve regurgitation. 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 The leaflets prolapse up to 1.3 cm above the MV annulus. There are 3 regurgitant jets. By 2D assessment, the ERO is 0.51 cm2 (combining 2 jets, difficult 3rd) with Rvol 64 cc (grossly underestimated). The 3D VCA (AROA) is 0.89 cm2 with Rvol 111 cc. I suspect the true Rvol is in between these. The regurgitation occurs in ~75% of systole. There is systolic flow reversal in the left upper pulmonary vein. Overall, findings consistent with severe MR from severe MV prolapse (Barlow's disease).  2. Left ventricular ejection fraction by 3D volume is is 62 %.  3. Left ventricular ejection fraction, by visual estimation, is 60 to 65%. The left ventricle has normal function. There is no left ventricular hypertrophy.  4. Left ventricular diastolic function could not be evaluated.  5. The left ventricle has no regional wall motion abnormalities.  6. Global right ventricle has normal systolic function.The right ventricular size is normal. No increase in right ventricular wall thickness.  7. Left atrial size was moderately dilated.  8. No LAA thrombus.  9. Right atrial size was normal. 10. Severe mitral valve prolapse. 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 Severe mitral valve regurgitation. 12. Severe thickening of the anterior and posterior mitral valve leaflet(s). 13. The tricuspid valve is grossly normal. Tricuspid valve regurgitation is mild. 14. The aortic valve is tricuspid. Aortic valve  regurgitation is not visualized. No evidence of aortic valve sclerosis or stenosis. 15. The pulmonic valve was grossly normal. Pulmonic valve regurgitation is not visualized. 16. Minimal plaque invoving the transverse and descending aorta. FINDINGS  Left Ventricle: Left ventricular ejection fraction, by visual estimation, is 60 to 65%. The left ventricle has normal function. Left ventricular ejection fraction by 3D volume is is 62 % The left ventricle has no regional wall motion abnormalities. The left ventricular internal cavity size was the left ventricle is normal in size. There is no left ventricular hypertrophy. Left ventricular diastolic function could not be evaluated. Right Ventricle: The right ventricular size is normal. No increase in right ventricular wall thickness. Global RV systolic function is has normal systolic function. Left Atrium: Left atrial size was moderately dilated. No LAA thrombus. Right Atrium: Right atrial size was normal in size Pericardium: There is no evidence of pericardial effusion. Mitral Valve: The mitral valve is myxomatous. There is severe holosystolic prolapse of of the mitral valve. There is severe thickening of the anterior and posterior mitral valve leaflet(s). Severe mitral valve regurgitation. There  is severe mitral valve regurgitation. 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 The leaflets prolapse up to 1.3 cm above the MV annulus. There are 3 regurgitant jets. By 2D assessment, the ERO is 0.51 cm2 (combining 2 jets, difficult 3rd) with Rvol 64 cc (grossly underestimated). The 3D VCA (AROA) is 0.89 cm2 with Rvol 111 cc. I suspect the true Rvol is in between these. The regurgitation occurs in ~75% of systole. There is systolic flow reversal in the left upper pulmonary  vein. Overall, findings consistent with severe MR from severe MV prolapse (Barlow's disease). Tricuspid Valve: The tricuspid valve is grossly normal.  Tricuspid valve regurgitation is mild. Aortic Valve: The aortic valve is tricuspid. Aortic valve regurgitation is not visualized. The aortic valve is structurally normal, with no evidence of sclerosis or stenosis. Pulmonic Valve: The pulmonic valve was grossly normal. Pulmonic valve regurgitation is not visualized. Aorta: The aortic root and ascending aorta are structurally normal, with no evidence of dilitation. There is minimal, layered plaque involving the transverse and descending aorta. Venous: A pattern of systolic flow reversal, suggestive of severe mitral regurgitation is recorded from the left upper pulmonary vein. Shunts: No atrial level shunt detected by color flow Doppler.   3D Volume EF LV 3D EF:    Left ventricular ejection fraction by 3D volume is is              62 % LV 3D EDV:   165.00 ml LV 3D ESV:   63.00 ml  3D Volume EF LV 3D EF:    61.80 %  AORTA                 Normals Ao Root diam: 3.60 cm 31 mm Ao STJ diam:  3.1 cm Ao Asc diam:  3.30 cm 31 mm MR Peak grad: 161.4 mmHg MR Vmax:      635.30 cm/s  Eleonore Chiquito MD Electronically signed by Eleonore Chiquito MD Signature Date/Time: 08/09/2019/2:32:33 PM    Final (Updated)    ECHO INTRAOPERATIVE TEE  Result Date: 08/30/2019  *INTRAOPERATIVE TRANSESOPHAGEAL REPORT *  Patient Name:   NIKHITA MENTZEL Date of Exam: 08/30/2019 Medical Rec #:  [id number]       Height:       67.0 in Accession #:    [id number]      Weight:       189.2 lb Date of Birth:  [birth year]      BSA:          1.97 m Patient Age:    20 years        BP:           146/75 mmHg Patient Gender: F               HR:           75 bpm. Exam Location:  Inpatient Transesophogeal exam was perform intraoperatively during surgical procedure. Patient was closely monitored under general anesthesia during the entirety of examination. Indications:     Mitral Valve Prolapse Sonographer:     Mikki Santee RDCS (AE) Performing Phys: [place] Diagnosing Phys: Roberts Gaudy MD Complications: No  known complications during this procedure. POST-OP IMPRESSIONS - Left Atrial Appendage: The left atrial appendage appears unchanged from pre-bypass. - Aortic Valve: The aortic valve appears unchanged from pre-bypass. PRE-OP FINDINGS  Left Ventricle: The left ventricle has normal systolic function, with an ejection fraction of 55-60%. The cavity size was mildly  dilated. The LV cavity was globular shaped. There is mildly increased left ventricular wall thickness. The LV sytolic function appeared normal. The ejection fraction was estimated at 55-60% by visual inspection. There was mild global concentric LV hypertrophy. There were no regional wall motion abnormailities. On the post-bypass exam the LV systolic function appeared unchanged from the pre-bypass exam. Right Ventricle: The right ventricle has normal systolic function. The cavity was normal. There is no increase in right ventricular wall thickness. On the pre-bypass exam, the RV size and systolic function were normal. The RV basal diameter was 2.8 cm and there was normal contractility of the RV free wall. On the post-bypass exam, the initial views of the RV showed moderate RV systolic function with decreased contractility of the RV free wall, but no inter-ventricular septal flattening. Over the next 30-60 minutes following separation from cardiopulmonary bypass, the RV systolic function improved and appeared normal when the TEE probe was removed. Left Atrium: Left atrial size was dilated. The left atrial cavity was markedly enlarged and measured 6.2 cm in the medial-lateral dimension. There was no thrombus noted within the LA cavity or LA appendage. The left atrial appendage is well visualized and there is no evidence of thrombus present. Right Atrium: Right atrial size was normal in size. Right atrial pressure is estimated at 10 mmHg. Interatrial Septum: No atrial level shunt detected by color flow Doppler. Increased thickness of the atrial septum sparing the  fossa ovalis consistent with The interatrial septum appears to be lipomatous. There is right bowing of the interatrial septum, suggestive of elevated left atrial pressure. Pericardium: There is no evidence of pericardial effusion. Mitral Valve: Marland Kitchen. Mitral valve regurgitation is moderate to severe by color flow Doppler. The MR jet is centrally-directed. There is no evidence of mitral valve vegetation. 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 There were no flail leaflet segments seen. There were clefts in the posterior leaflet seprating the P1/P2 and P2/P3 scallops. There were two prominent jets of mitral rurgitation at the P1/P2 and P2/3 junctions respectively. The mitral regurgitation was graded as moderate to severe. There was blunted forward systolic flow in the left and right upper pulmonary veins without flow reversal. There was mild mitral annular calcification seen at the base of the posterior leaflet. On the post-bypass exam, there was an annuloplasty ring in the mitral postion. The anterior leaflet opened normally and the posterior leaflet was immobile. There was no residual mitral regurgitation. There was no systolic anterior motion of the mitral valve. The LV outflow tract mean gradient was 3 mm hg. The trans-mitral mean gradient was 3 mm hg. Tricuspid Valve: The tricuspid valve was normal in structure. Tricuspid valve regurgitation is mild by color flow Doppler. The jet is directed centrally. No TV vegetation was visualized. Aortic Valve: The aortic valve is tricuspid There is mild thickening of the aortic valve Aortic valve regurgitation was not visualized by color flow Doppler. There is no evidence of aortic valve stenosis. There is no evidence of a vegetation on the aortic valve. Pulmonic Valve: The pulmonic valve was normal in structure, with normal. No evidence of pumonic stenosis. Pulmonic valve regurgitation is trivial by color flow Doppler.  +--------------+-------++ LEFT VENTRICLE        +--------------+-------++ PLAX 2D               +--------------+-------++ LVIDd:        4.60 cm +--------------+-------++                       +--------------+-------++ +-------------+-----------++  AORTIC VALVE             +-------------+-----------++ AV Vmax:     144.00 cm/s +-------------+-----------++ AV Vmean:    77.900 cm/s +-------------+-----------++ AV VTI:      0.237 m     +-------------+-----------++ AV Peak Grad:8.3 mmHg    +-------------+-----------++ AV Mean Grad:3.0 mmHg    +-------------+-----------++ +-------------+---------++ MITRAL VALVE           +-------------+---------++ MV Peak grad:5.7 mmHg  +-------------+---------++ MV Mean grad:2.5 mmHg  +-------------+---------++ MV Vmax:     1.19 m/s  +-------------+---------++ MV Vmean:    66.0 cm/s +-------------+---------++ MV VTI:      0.27 m    +-------------+---------++  Kipp Brood MD Electronically signed by Kipp Brood MD Signature Date/Time: 08/30/2019/6:56:32 PM    Final    Doppler Pre CABG  Result Date: 08/28/2019 PREOPERATIVE VASCULAR EVALUATION  Indications:   Pre-CABG. Risk Factors:  Hypertension, hyperlipidemia, Diabetes. Other Factors: History of mitral valve prolapse and mitral regurgitation. Performing Technologist: Marilynne Halsted Chief Tech  Examination Guidelines: A complete evaluation includes B-mode imaging, spectral Doppler, color Doppler, and power Doppler as needed of all accessible portions of each vessel. Bilateral testing is considered an integral part of a complete examination. Limited examinations for reoccurring indications may be performed as noted.  Right Carotid Findings: +----------+--------+--------+--------+--------+------------------+           PSV cm/sEDV cm/sStenosisDescribeComments           +----------+--------+--------+--------+--------+------------------+ CCA Prox  80      22                                          +----------+--------+--------+--------+--------+------------------+ CCA Distal52      16                                         +----------+--------+--------+--------+--------+------------------+ ICA Prox  67      28      1-39%           intimal thickening +----------+--------+--------+--------+--------+------------------+ ICA Distal64      29                                         +----------+--------+--------+--------+--------+------------------+ ECA       120     18                                         +----------+--------+--------+--------+--------+------------------+ Portions of this table do not appear on this page. +----------+--------+-------+----------------+------------+           PSV cm/sEDV cmsDescribe        Arm Pressure +----------+--------+-------+----------------+------------+ Subclavian132            Multiphasic, WNL             +----------+--------+-------+----------------+------------+ +---------+--------+--+--------+--+---------+ VertebralPSV cm/s58EDV cm/s19Antegrade +---------+--------+--+--------+--+---------+ Left Carotid Findings: +----------+--------+--------+--------+--------+------------------+           PSV cm/sEDV cm/sStenosisDescribeComments           +----------+--------+--------+--------+--------+------------------+ CCA Prox  88      26                                         +----------+--------+--------+--------+--------+------------------+  CCA Mid                                   intimal thickening +----------+--------+--------+--------+--------+------------------+ CCA Distal88      26                      intimal thickening +----------+--------+--------+--------+--------+------------------+ ICA Prox  72      34      1-39%           intimal thickening +----------+--------+--------+--------+--------+------------------+ ICA Distal87      34                                          +----------+--------+--------+--------+--------+------------------+ ECA       93      16                                         +----------+--------+--------+--------+--------+------------------+ +----------+--------+--------+----------------+------------+ SubclavianPSV cm/sEDV cm/sDescribe        Arm Pressure +----------+--------+--------+----------------+------------+           120             Multiphasic, WNL             +----------+--------+--------+----------------+------------+ +---------+--------+--+--------+--+---------+ VertebralPSV cm/s42EDV cm/s14Antegrade +---------+--------+--+--------+--+---------+  ABI Findings: +--------+------------------+-----+---------+--------+ Right   Rt Pressure (mmHg)IndexWaveform Comment  +--------+------------------+-----+---------+--------+ Brachial                       triphasic         +--------+------------------+-----+---------+--------+ +--------+------------------+-----+---------+-------+ Left    Lt Pressure (mmHg)IndexWaveform Comment +--------+------------------+-----+---------+-------+ Brachial                       triphasic        +--------+------------------+-----+---------+-------+  Right Doppler Findings: +-----------+--------+-----+---------+--------+ Site       PressureIndexDoppler  Comments +-----------+--------+-----+---------+--------+ Brachial                triphasic         +-----------+--------+-----+---------+--------+ Radial                  triphasic         +-----------+--------+-----+---------+--------+ Ulnar                   triphasic         +-----------+--------+-----+---------+--------+ Palmar Arch                      WNL      +-----------+--------+-----+---------+--------+  Left Doppler Findings: +-----------+--------+-----+---------+--------+ Site       PressureIndexDoppler  Comments  +-----------+--------+-----+---------+--------+ Brachial                triphasic         +-----------+--------+-----+---------+--------+ Radial                  triphasic         +-----------+--------+-----+---------+--------+ Ulnar                   triphasic         +-----------+--------+-----+---------+--------+ Palmar Arch  WNL      +-----------+--------+-----+---------+--------+  Summary: Right Carotid: Velocities in the right ICA are consistent with a 1-39% stenosis. Left Carotid: Velocities in the left ICA are consistent with a 1-39% stenosis. Vertebrals:  Bilateral vertebral arteries demonstrate antegrade flow. Subclavians: Normal flow hemodynamics were seen in bilateral subclavian              arteries. Bilateral Extremity: Doppler waveforms remain within normal limits with compression bilaterally for the radial arteries. Doppler waveforms remain within normal limits with compression bilaterally for the ulnar arteries.  Electronically signed by Coral Else MD on 08/28/2019 at 1:44:30 PM.    Final    CT Angio Abd/Pel w/ and/or w/o  Result Date: 08/27/2019 CLINICAL DATA:  Mitral valve prolapse, mitral regurgitation preoperative assessment prior to planned mitral valve repair. EXAM: CT ANGIOGRAPHY CHEST, ABDOMEN AND PELVIS TECHNIQUE: Multidetector CT imaging through the chest, abdomen and pelvis was performed using the standard protocol during bolus administration of intravenous contrast. Multiplanar reconstructed images and MIPs were obtained and reviewed to evaluate the vascular anatomy. CONTRAST:  OMNIPAQUE IOHEXOL 350 MG/ML SOLN COMPARISON:  None. FINDINGS: CTA CHEST FINDINGS Cardiovascular: The left atrium appears dilated. No pericardial fluid. Calcified plaque is noted of the coronary arteries in a 3 vessel distribution. The thoracic aorta is normal in caliber and demonstrates mild atherosclerosis at the level of the arch and descending thoracic  aorta. Calcified plaque at the origin of the left subclavian artery causes approximately 40% maximal stenosis. Eccentric noncalcified plaque in the innominate artery causes approximately 40-50% stenosis. Other visualized great vessels demonstrate normal patency and are moderately tortuous. Central pulmonary arteries are normal in caliber. Mediastinum/Nodes: No enlarged mediastinal, hilar, or axillary lymph nodes. Thyroid gland, trachea, and esophagus demonstrate no significant findings. There is a small hiatal hernia. Lungs/Pleura: Region scarring and atelectasis in the lingula. There is no evidence of pulmonary edema, consolidation, pneumothorax, nodule or pleural fluid. Musculoskeletal: No chest wall abnormality. No acute or significant osseous findings. Review of the MIP images confirms the above findings. CTA ABDOMEN AND PELVIS FINDINGS VASCULAR Aorta: The abdominal aorta demonstrates scattered calcified and noncalcified atherosclerotic plaque without evidence of aneurysmal disease or stenosis. Celiac: Normally patent. Normally patent branch vessels. SMA: Normally patent. Normally patent visualized branch vessels. Renals: Bilateral single renal arteries demonstrate mild atherosclerosis without evidence of stenosis. IMA: Normally patent. Inflow: Calcified plaque in both common iliac arteries without evidence significant stenosis or aneurysmal disease. Bilateral external and internal iliac arteries demonstrate normal patency. Mild plaque in both common femoral arteries without evidence of obstructive disease. The left femoral bifurcation is high and located just above the inguinal ligament at the level of the acetabular roof. The right femoral bifurcation is normal in position at the level of the right femoral neck. Review of the MIP images confirms the above findings. NON-VASCULAR Hepatobiliary: Diffuse hepatic steatosis without evidence focal lesion or overt cirrhosis. The gallbladder has been removed. No  biliary ductal dilatation identified. Pancreas: Unremarkable. No pancreatic ductal dilatation or surrounding inflammatory changes. Spleen: Normal in size without focal abnormality. Adrenals/Urinary Tract: Adrenal glands are unremarkable. Kidneys are normal, without renal calculi, focal lesion, or hydronephrosis. Bladder is unremarkable. Stomach/Bowel: Bowel shows no evidence of obstruction, ileus or inflammation. No obvious focal lesions. No free air. Lymphatic: No enlarged lymph nodes identified. Reproductive: Status post hysterectomy. No adnexal masses. Other: No abdominal wall hernia or abnormality. No abdominopelvic ascites. Musculoskeletal: Degenerative disc disease with disc space narrowing at L4-5. No fractures or bony lesions identified.  Review of the MIP images confirms the above findings. IMPRESSION: 1. Mild atherosclerosis of the thoracic and abdominal aorta without evidence of aneurysmal disease or stenosis. 2. 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 3. Calcified atherosclerosis of the coronary arteries in a 3 vessel distribution. 4. Left atrial enlargement. 5. Small hiatal hernia. 6. Left femoral bifurcation is high and located just above the inguinal ligament at the level of the acetabular roof. The right femoral bifurcation is normal in position at the level of the right femoral neck. 7. Hepatic steatosis without focal lesion or overt cirrhosis. Electronically Signed   By: Irish Lack M.D.   On: 08/27/2019 08:45   Discharge Instructions    Amb Referral to Cardiac Rehabilitation   Complete by: As directed    Diagnosis: Valve Repair   Valve: Mitral Comment - minimally invasive   After initial evaluation and assessments completed: Virtual Based Care may be provided alone or in conjunction with Phase 2 Cardiac Rehab based on patient barriers.: Yes      Discharge Medications: Allergies as of 09/05/2019       Reactions   Penicillins Other (See Comments)   Convulsions  Did it involve swelling of the face/tongue/throat, SOB, or low BP? No Did it involve sudden or severe rash/hives, skin peeling, or any reaction on the inside of your mouth or nose? No Did you need to seek medical attention at a hospital or doctor's office? No When did it last happen?Childhood allergy If all above answers are "NO", may proceed with cephalosporin use.      Medication List    STOP taking these medications   ciprofloxacin 500 MG tablet Commonly known as: CIPRO   losartan 25 MG tablet Commonly known as: COZAAR   metoprolol succinate 50 MG 24 hr tablet Commonly known as: TOPROL-XL     TAKE these medications   amiodarone 200 MG tablet Commonly known as: PACERONE Take 1 tablet (200 mg total) by mouth daily.   aspirin EC 81 MG tablet Take 1 tablet (81 mg total) by mouth daily.   ferrous sulfate 325 (65 FE) MG tablet Take 1 tablet (325 mg total) by mouth daily with breakfast. For one month then stop;may stop sooner if develops constipation   furosemide 40 MG tablet Commonly known as: LASIX Take 1 tablet (40 mg total) by mouth daily. For one week then stop What changed:   medication strength  how much to take  when to take this  reasons to take this  additional instructions   ibuprofen 200 MG tablet Commonly known as: ADVIL Take 600 mg by mouth every 6 (six) hours as needed for headache or moderate pain.   metFORMIN 500 MG tablet Commonly known as: GLUCOPHAGE Take 1 tablet (500 mg total) by mouth 2 (two) times daily with a meal.   metoprolol tartrate 25 MG tablet Commonly known as: LOPRESSOR Take 0.5 tablets (12.5 mg total) by mouth 2 (two) times daily.   nortriptyline 10 MG capsule Commonly known as: PAMELOR Take 20 mg by mouth at bedtime.   oxyCODONE 5 MG immediate release tablet Commonly known as: Oxy IR/ROXICODONE Take 1 tablet (5 mg total) by mouth every 4 (four) hours as  needed for severe pain.   potassium chloride SA 20 MEQ tablet Commonly known as: KLOR-CON Take 1 tablet (20 mEq total) by mouth daily. For one week then stop.   rosuvastatin 20 MG tablet Commonly known as: CRESTOR  Take 1 tablet (20 mg total) by mouth at bedtime.   Victoza 18 MG/3ML Sopn Generic drug: liraglutide Inject 0.3 mLs (1.8 mg total) into the skin daily.   warfarin 5 MG tablet Commonly known as: COUMADIN Take 1 tablet (5 mg total) by mouth daily at 6 PM. Or as directed.   zolpidem 6.25 MG CR tablet Commonly known as: Ambien CR Take 1 tablet (6.25 mg total) by mouth at bedtime as needed for sleep.      The patient has been discharged on:   1.Beta Blocker:  Yes [ x  ]                              No   [   ]                              If No, reason:  2.Ace Inhibitor/ARB: Yes [   ]                                     No  [  x  ]                                     If No, reason:Labile BP, will likely restart Losartan as outpatient  3.Statin:   Yes [ x  ]                  No  [   ]                  If No, reason:  4.Ecasa:  Yes  [ x  ]                  No   [   ]                  If No, reason:  Follow Up Appointments: Follow-up Information    Purcell Nails, MD. Go on 09/10/2019.   Specialty: Cardiothoracic Surgery Why: PA/LAT CXR to be taken (at [facility] which is in the same building as Dr. Orvan July office) on 01/25 at 12:30 pm;Appointment time is at 1:00 pm Contact information: [address] [place] [address] [phone]        Creig Hines, NP Follow up on 09/12/2019.   Specialties: Nurse Practitioner, Cardiology, Radiology Why: Your follow up appointment will be on 09/12/2019 at 1120am.  Contact information: [address] [place] [address] [phone]        [facility] [employer]. Go on 09/07/2019.   Specialty: Cardiology Why: Appointment is for PT and INR to be drawn (on  Coumadin for mitral valve repair) and appointment time is at 10:30 am Contact information: [address], [address] [place] [address] [phone]       Newman Nip, NP Follow up on 09/11/2019.   Specialties: Nurse Practitioner, Cardiology Why: Appointment is at 10:30 Contact information: [address] [place] [address] [phone]           Signed: Lelon Huh [facility] 09/05/2019, 8:54 AM

## 2019-08-31 NOTE — Progress Notes (Signed)
EVENING ROUNDS NOTE :     [address]       [place] [address]             [phone]                 1 Day Post-Op Procedure(s) (LRB): MINIMALLY INVASIVE MITRAL VALVE REPAIR (MVR) USING MEMO 4D SIZE 40 (Right) TRANSESOPHAGEAL ECHOCARDIOGRAM (TEE) (N/A)   Total Length of Stay:  LOS: 1 day  Events:  Doing well No events Likely floor tomorrow    BP 95/61 (BP Location: Left Arm)   Pulse 81   Temp 99 F (37.2 C)   Resp 18   Ht 5\' 7"  (1.702 m)   Wt 93.3 kg   LMP  (LMP Unknown)   SpO2 95%   BMI 32.22 kg/m   PAP: (28-37)/(14-23) 32/17 CVP:  [11 mmHg-17 mmHg] 16 mmHg CO:  [4.1 L/min-4.2 L/min] 4.2 L/min CI:  [2.1 L/min/m2] 2.1 L/min/m2     . sodium chloride    . lactated ringers    . lactated ringers    . phenylephrine (NEO-SYNEPHRINE) Adult infusion Stopped (08/31/19 1827)    I/O last 3 completed shifts: In: 7582.6 [P.O.:450; I.V.:4882.5; Blood:130; IV Piggyback:2120.1] Out: 4840 [Urine:3140; Blood:650; Chest Tube:1050]   CBC Latest Ref Rng & Units 08/31/2019 08/31/2019 08/30/2019  WBC 4.0 - 10.5 K/uL 11.3(H) 9.2 7.7  Hemoglobin 12.0 - 15.0 g/dL 10/28/2019) 2.3(N) 3.6(R)  Hematocrit 36.0 - 46.0 % 25.9(L) 27.2(L) 26.8(L)  Platelets 150 - 400 K/uL 107(L) 133(L) 122(L)    BMP Latest Ref Rng & Units 08/31/2019 08/31/2019 08/30/2019  Glucose 70 - 99 mg/dL 10/28/2019) 154(M) 086(P)  BUN 8 - 23 mg/dL 13 9 10   Creatinine 0.44 - 1.00 mg/dL 619(J) 0.93(O  BUN/Creat Ratio 12 - 28 - - -  Sodium 135 - 145 mmol/L 131(L) 139 142  Potassium 3.5 - 5.1 mmol/L 4.0 4.0 4.5  Chloride 98 - 111 mmol/L 101 107 109  CO2 22 - 32 mmol/L 20(L) 24 24  Calcium 8.9 - 10.3 mg/dL 7.4(L) 7.6(L) 8.0(L)    ABG    Component Value Date/Time   PHART 7.354 08/30/2019 1458   PCO2ART 41.7 08/30/2019 1458   PO2ART 96.0 08/30/2019 1458   HCO3 23.4 08/30/2019 1458   TCO2 25 08/30/2019 1458   ACIDBASEDEF 2.0 08/30/2019 1458   O2SAT 97.0 08/30/2019 1458       Bonnie Hagan,  MD 08/31/2019 8:06 PM

## 2019-08-31 NOTE — Progress Notes (Addendum)
[facility] DAILY ICU PROGRESS NOTE                   [address]            [name] [address]          [phone]   1 Day Post-Op Procedure(s) (LRB): MINIMALLY INVASIVE MITRAL VALVE REPAIR (MVR) USING MEMO 4D SIZE 40 (Right) TRANSESOPHAGEAL ECHOCARDIOGRAM (TEE) (N/A)  Total Length of Stay:  LOS: 1 day   Subjective: Patient is talkative this am. She had incisional pain earlier this am.  Objective: Vital signs in last 24 hours: Temp:  [98.2 F (36.8 C)-100.2 F (37.9 C)] 99.9 F (37.7 C) (01/08 0700) Pulse Rate:  [77-89] 78 (01/08 0700) Cardiac Rhythm: Normal sinus rhythm (01/08 0400) Resp:  [13-35] 19 (01/08 0700) SpO2:  [93 %-100 %] 93 % (01/08 0700) Arterial Line BP: (87-126)/(51-68) 104/53 (01/08 0700) Weight:  [93.3 kg] 93.3 kg (01/08 0500)  Filed Weights   08/30/19 0624 08/31/19 0500  Weight: 85.8 kg 93.3 kg    Weight change: 7.5 kg   Hemodynamic parameters for last 24 hours: PAP: (27-37)/(10-23) 34/19 CO:  [3.8 L/min-4.2 L/min] 4.2 L/min CI:  [1.9 L/min/m2-2.1 L/min/m2] 2.1 L/min/m2  Intake/Output from previous day: 01/07 0701 - 01/08 0700 In: 6498.3 [I.V.:4467.9; Blood:130; IV Piggyback:1900.4] Out: 4175 [Urine:2775; Blood:650; Chest Tube:750]  Intake/Output this shift: No intake/output data recorded.  Current Meds: Scheduled Meds: . acetaminophen (TYLENOL) oral liquid 160 mg/5 mL  650 mg Per Tube Once  . acetaminophen  1,000 mg Oral Q6H  . aspirin EC  325 mg Oral Daily  . [START ON 09/01/2019] aspirin EC  81 mg Oral Daily  . bisacodyl  10 mg Oral Daily   Or  . bisacodyl  10 mg Rectal Daily  . chlorhexidine  15 mL Mouth/Throat NOW  . Chlorhexidine Gluconate Cloth  6 each Topical Daily  . [place] [facility], Patient & Family Education   Does not apply Once  . docusate sodium  200 mg Oral Daily  . [START ON 09/01/2019] enoxaparin (LOVENOX) injection  30 mg Subcutaneous QHS  . insulin aspart  0-24 Units Subcutaneous Q4H  . insulin  detemir  20 Units Subcutaneous Once  . [START ON 09/01/2019] insulin detemir  20 Units Subcutaneous Daily  . nortriptyline  20 mg Oral QHS  . [START ON 09/01/2019] pantoprazole  40 mg Oral Daily  . [START ON 09/02/2019] rosuvastatin  20 mg Oral QHS  . sodium chloride flush  3 mL Intravenous Q12H  . warfarin  2.5 mg Oral q1800  . Warfarin - Physician Dosing Inpatient   Does not apply q1800   Continuous Infusions: . sodium chloride    . albumin human 12.5 g (08/30/19 1809)  . lactated ringers    . lactated ringers    . levofloxacin (LEVAQUIN) IV    . phenylephrine (NEO-SYNEPHRINE) Adult infusion 70 mcg/min (08/31/19 0700)   PRN Meds:.albumin human, dextrose, metoprolol tartrate, morphine injection, ondansetron (ZOFRAN) IV, oxyCODONE, sodium chloride flush, traMADol  General appearance: alert, cooperative and no distress Neurologic: intact Heart: RRR, no murmur Lungs: Diminshed breath sounds bilaterally Abdomen: Soft, non tender, sporadic bowel sounds present Extremities: Trace LE edema Wound: Aquacel intact  Lab Results: CBC: Recent Labs    08/30/19 2105 08/31/19 0206  WBC 7.7 9.2  HGB 9.1* 9.0*  HCT 26.8* 27.2*  PLT 122* 133*   BMET:  Recent Labs    08/30/19 2105 08/31/19 0206  NA 142  139  K 4.5 4.0  CL 109 107  CO2 24 24  GLUCOSE 173* 154*  BUN 10 9  CREATININE 0.80 0.76  CALCIUM 8.0* 7.6*    CMET: Lab Results  Component Value Date   WBC 9.2 08/31/2019   HGB 9.0 (L) 08/31/2019   HCT 27.2 (L) 08/31/2019   PLT 133 (L) 08/31/2019   GLUCOSE 154 (H) 08/31/2019   CHOL 265 (H) 09/01/2018   TRIG 664 (H) 09/01/2018   HDL 54 09/01/2018   LDLDIRECT 240.5 09/08/2006   LDLCALC  09/01/2018     Comment:     . LDL cholesterol not calculated. Triglyceride levels greater than 400 mg/dL invalidate calculated LDL results. . Reference range: <100 . Desirable range <100 mg/dL for primary prevention;   <70 mg/dL for patients with CHD or diabetic patients  with > or = 2  CHD risk factors. Marland Kitchen LDL-C is now calculated using the Martin-Hopkins  calculation, which is a validated novel method providing  better accuracy than the Friedewald equation in the  estimation of LDL-C.  Horald Pollen et al. Lenox Ahr. 7353;299(24): 2061-2068  (http://education.[facility].com/faq/FAQ164)    ALT 56 (H) 08/28/2019   AST 51 (H) 08/28/2019   NA 139 08/31/2019   K 4.0 08/31/2019   CL 107 08/31/2019   CREATININE 0.76 08/31/2019   BUN 9 08/31/2019   CO2 24 08/31/2019   TSH 0.98 09/01/2018   INR 1.3 (H) 08/30/2019   HGBA1C 7.0 (H) 08/28/2019      PT/INR:  Recent Labs    08/30/19 1517  LABPROT 16.2*  INR 1.3*   Radiology: DG Chest Port 1 View  Result Date: 08/31/2019 CLINICAL DATA:  Mitral valve replacement 08/30/2019, chest pain and shortness of breath EXAM: PORTABLE CHEST 1 VIEW COMPARISON:  Radiograph 08/30/2019 FINDINGS: *Epicardial pacer device overlies the chest wall with leads projecting over the mediastinum. *A Swan-Ganz catheter tip terminates at the level of the main pulmonary artery. *A right pleural drain remains in place. *Mitral valve replacement is again seen in stable position. Diminished lung volumes with streaky basilar opacities favoring atelectasis. More focal bandlike opacity in the right mid lung is compatible with subsegmental atelectasis. There is some increased postoperative mediastinal widening though this may be accentuated by portable technique and low volumes. No other acute interval change. No acute osseous or soft tissue abnormality. Cholecystectomy clips in the right upper quadrant. IMPRESSION: 1. Support devices as above. 2. Low lung volumes with streaky bibasilar opacities favoring atelectasis. 3. Increased postoperative mediastinal widening. Possibly accentuated by diminished volumes and portable technique, finding should be correlated with patient's clinical status. Electronically Signed   By: Kreg Shropshire M.D.   On: 08/31/2019 06:34   DG  Chest Port 1 View  Result Date: 08/30/2019 CLINICAL DATA:  64 year old female postoperative day zero minimally invasive mitral valve repair. EXAM: PORTABLE CHEST 1 VIEW COMPARISON:  1404 hours today and earlier. FINDINGS: Portable AP semi upright view at 1505 hours. Extubated. Right IJ approach Swan-Ganz catheter is stable. Chest or mediastinal tubes remain in place. Larger lung volumes. Stable cardiac size and mediastinal contours. Streaky perihilar atelectasis has mildly decreased, remains greater on the right. No pneumothorax identified. No pulmonary edema or pleural effusion. IMPRESSION: 1. Extubated. Otherwise stable lines and tubes. 2. Mildly improved perihilar atelectasis. No pneumothorax or pleural effusion. Electronically Signed   By: Odessa Fleming M.D.   On: 08/30/2019 15:18   DG Chest Portable 1 View  Result Date: 08/30/2019 CLINICAL DATA:  Status post cardiac surgery  EXAM: PORTABLE CHEST 1 VIEW COMPARISON:  08/28/2019 FINDINGS: Dual lumen endotracheal tube with the tip in the left mainstem bronchus. Swan-Ganz catheter with the tip projecting over the right ventricular outflow tract. Mediastinal drains are noted. Bilateral lower lobe airspace disease, right greater than left, consistent with atelectasis. No pleural effusion or pneumothorax. Stable cardiomediastinal silhouette. Interval valvuloplasty. IMPRESSION: Dual lumen endotracheal tube with the tip in the left mainstem bronchus. Patient has been extubated at this time following the x-ray. Swan-Ganz catheter with the tip projecting over the right ventricular outflow tract. Mediastinal drains are noted. Bilateral lower lobe atelectasis, right greater than left. Electronically Signed   By: Elige Ko   On: 08/30/2019 14:25   ECHO INTRAOPERATIVE TEE  Result Date: 08/30/2019  *INTRAOPERATIVE TRANSESOPHAGEAL REPORT *  Patient Name:   LAYSA KIMMEY Date of Exam: 08/30/2019 Medical Rec #:  [id number]       Height:       67.0 in Accession #:    [id number]       Weight:       189.2 lb Date of Birth:  1956/05/02      BSA:          1.97 m Patient Age:    63 years        BP:           146/75 mmHg Patient Gender: F               HR:           75 bpm. Exam Location:  Inpatient Transesophogeal exam was perform intraoperatively during surgical procedure. Patient was closely monitored under general anesthesia during the entirety of examination. Indications:     Mitral Valve Prolapse Sonographer:     Thurman Coyer RDCS (AE) Performing Phys: [id number] Decent Teven Mittman Diagnosing Phys: Kipp Brood MD Complications: No known complications during this procedure. POST-OP IMPRESSIONS - Left Atrial Appendage: The left atrial appendage appears unchanged from pre-bypass. - Aortic Valve: The aortic valve appears unchanged from pre-bypass. PRE-OP FINDINGS  Left Ventricle: The left ventricle has normal systolic function, with an ejection fraction of 55-60%. The cavity size was mildly dilated. The LV cavity was globular shaped. There is mildly increased left ventricular wall thickness. The LV sytolic function appeared normal. The ejection fraction was estimated at 55-60% by visual inspection. There was mild global concentric LV hypertrophy. There were no regional wall motion abnormailities. On the post-bypass exam the LV systolic function appeared unchanged from the pre-bypass exam. Right Ventricle: The right ventricle has normal systolic function. The cavity was normal. There is no increase in right ventricular wall thickness. On the pre-bypass exam, the RV size and systolic function were normal. The RV basal diameter was 2.8 cm and there was normal contractility of the RV free wall. On the post-bypass exam, the initial views of the RV showed moderate RV systolic function with decreased contractility of the RV free wall, but no inter-ventricular septal flattening. Over the next 30-60 minutes following separation from cardiopulmonary bypass, the RV systolic function improved and appeared  normal when the TEE probe was removed. Left Atrium: Left atrial size was dilated. The left atrial cavity was markedly enlarged and measured 6.2 cm in the medial-lateral dimension. There was no thrombus noted within the LA cavity or LA appendage. The left atrial appendage is well visualized and there is no evidence of thrombus present. Right Atrium: Right atrial size was normal in size. Right atrial pressure is estimated at 10 mmHg. Interatrial  Septum: No atrial level shunt detected by color flow Doppler. Increased thickness of the atrial septum sparing the fossa ovalis consistent with The interatrial septum appears to be lipomatous. There is right bowing of the interatrial septum, suggestive of elevated left atrial pressure. Pericardium: There is no evidence of pericardial effusion. Mitral Valve: Marland Kitchen Mitral valve regurgitation is moderate to severe by color flow Doppler. The MR jet is centrally-directed. There is no evidence of mitral valve vegetation. 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 There were no flail leaflet segments seen. There were clefts in the posterior leaflet seprating the P1/P2 and P2/P3 scallops. There were two prominent jets of mitral rurgitation at the P1/P2 and P2/3 junctions respectively. The mitral regurgitation was graded as moderate to severe. There was blunted forward systolic flow in the left and right upper pulmonary veins without flow reversal. There was mild mitral annular calcification seen at the base of the posterior leaflet. On the post-bypass exam, there was an annuloplasty ring in the mitral postion. The anterior leaflet opened normally and the posterior leaflet was immobile. There was no residual mitral regurgitation. There was no systolic anterior motion of the mitral valve. The LV outflow tract mean gradient was 3 mm hg. The trans-mitral mean gradient was 3 mm hg. Tricuspid Valve: The tricuspid valve was normal in structure.  Tricuspid valve regurgitation is mild by color flow Doppler. The jet is directed centrally. No TV vegetation was visualized. Aortic Valve: The aortic valve is tricuspid There is mild thickening of the aortic valve Aortic valve regurgitation was not visualized by color flow Doppler. There is no evidence of aortic valve stenosis. There is no evidence of a vegetation on the aortic valve. Pulmonic Valve: The pulmonic valve was normal in structure, with normal. No evidence of pumonic stenosis. Pulmonic valve regurgitation is trivial by color flow Doppler. +--------------+-------++ LEFT VENTRICLE        +--------------+-------++ PLAX 2D               +--------------+-------++ LVIDd:        4.60 cm +--------------+-------++                       +--------------+-------++ +-------------+-----------++ AORTIC VALVE             +-------------+-----------++ AV Vmax:     144.00 cm/s +-------------+-----------++ AV Vmean:    77.900 cm/s +-------------+-----------++ AV VTI:      0.237 m     +-------------+-----------++ AV Peak Grad:8.3 mmHg    +-------------+-----------++ AV Mean Grad:3.0 mmHg    +-------------+-----------++ +-------------+---------++ MITRAL VALVE           +-------------+---------++ MV Peak grad:5.7 mmHg  +-------------+---------++ MV Mean grad:2.5 mmHg  +-------------+---------++ MV Vmax:     1.19 m/s  +-------------+---------++ MV Vmean:    66.0 cm/s +-------------+---------++ MV VTI:      0.27 m    +-------------+---------++  Roberts Gaudy MD Electronically signed by Roberts Gaudy MD Signature Date/Time: 08/30/2019/6:56:32 PM    Final      Assessment/Plan: S/P Procedure(s) (LRB): MINIMALLY INVASIVE MITRAL VALVE REPAIR (MVR) USING MEMO 4D SIZE 40 (Right) TRANSESOPHAGEAL ECHOCARDIOGRAM (TEE) (N/A)   1. CV-SR with HR in the 80's this am. CO/CI 4.6/2.4. On Neo Synephrine drip. Coumadin to be started tonight. 2. Pulmonary-on 6 liters of oxygen  via [place]. Will wean as able over next few days. Chest tubes with 750 cc. Chest tubes are to suction and there is no air leak.  CXR this am shows low lung volumes, bibasilar atelectasis. Encourage incentive spirometer and flutter valve. 3. Volume overload-will diurese once off Neo 4. Expected ABL anemia-H and H 9 and 27.2 5. DM-CBGs 138/132/145. Pre op HGA1C 7. On Insulin. Will restart Metformin once tolerating oral better. 6. Mild thrombocytopenia-platelets 133,000 7. Progression orders done by Dr. Leafy Kindlewen  Donielle Margaretann LovelessM Zimmerman PA-C 08/31/2019 7:26 AM    I have seen and examined the patient and agree with the assessment and plan as outlined.  Doing very well POD1.  Maintaining NSR w/ stable hemodynamics on low dose Neo drip for BP support, PA pressures relatively low.  Breathing comfortably w/ O2 sats 92-94%.  CXR looks okay although significant bibasilar atelectasis R>L   Mobilize  D/C lines  Wean Neo off as tolerated  Hold diuretics until this afternoon or tomorrow depending on BP  Add low dose beta blocker once stable off Neo  Leave chest tubes at least 3 days  Start Coumadin slowly  Add levemir insulin  Possible transfer 4E tomorrow if stable off Neo drip  Purcell Nailslarence H Carnisha Feltz, MD 08/31/2019 9:02 AM

## 2019-08-31 NOTE — Progress Notes (Signed)
Anesthesiology Follow-up:  64 year old female one day S/P MV repair for Barlow's disease. Extubated in OR. Awake and alert, on neosynephrine 50 mcg/min for BP support.  VS: T-37.2 BP- 103/66 HR 79 (SR) RR- 24 O2 Sat 94% on 6L [place] 32/17 CO/CI- 4.6/2.1  K- 4.1 BUN/Cr.- 9/0.76 glucose- 154 H/H- 9.0/27.2 Plts- 133,00  CXR bilateral atelectasis lower lung fields R>L   Stable post-op course, still requiring neo for support support. Continue pulmonary toilet. Stable post-op course.  Kipp Brood

## 2019-09-01 ENCOUNTER — Inpatient Hospital Stay (HOSPITAL_COMMUNITY): Payer: Managed Care, Other (non HMO)

## 2019-09-01 LAB — CBC
HCT: 23.8 % — ABNORMAL LOW (ref 36.0–46.0)
Hemoglobin: 8 g/dL — ABNORMAL LOW (ref 12.0–15.0)
MCH: 33.1 pg (ref 26.0–34.0)
MCHC: 33.6 g/dL (ref 30.0–36.0)
MCV: 98.3 fL (ref 80.0–100.0)
Platelets: 83 10*3/uL — ABNORMAL LOW (ref 150–400)
RBC: 2.42 MIL/uL — ABNORMAL LOW (ref 3.87–5.11)
RDW: 14.7 % (ref 11.5–15.5)
WBC: 7.3 10*3/uL (ref 4.0–10.5)
nRBC: 0 % (ref 0.0–0.2)

## 2019-09-01 LAB — GLUCOSE, CAPILLARY
Glucose-Capillary: 156 mg/dL — ABNORMAL HIGH (ref 70–99)
Glucose-Capillary: 251 mg/dL — ABNORMAL HIGH (ref 70–99)
Glucose-Capillary: 157 mg/dL — ABNORMAL HIGH (ref 70–99)
Glucose-Capillary: 208 mg/dL — ABNORMAL HIGH (ref 70–99)
Glucose-Capillary: 226 mg/dL — ABNORMAL HIGH (ref 70–99)

## 2019-09-01 LAB — BASIC METABOLIC PANEL
Anion gap: 8 (ref 5–15)
BUN: 14 mg/dL (ref 8–23)
CO2: 24 mmol/L (ref 22–32)
Calcium: 7.6 mg/dL — ABNORMAL LOW (ref 8.9–10.3)
Chloride: 99 mmol/L (ref 98–111)
Creatinine, Ser: 1.17 mg/dL — ABNORMAL HIGH (ref 0.44–1.00)
GFR calc Af Amer: 57 mL/min — ABNORMAL LOW (ref >60)
GFR calc non Af Amer: 50 mL/min — ABNORMAL LOW (ref >60)
Glucose, Bld: 175 mg/dL — ABNORMAL HIGH (ref 70–99)
Potassium: 3.6 mmol/L (ref 3.5–5.1)
Sodium: 131 mmol/L — ABNORMAL LOW (ref 135–145)

## 2019-09-01 LAB — PROTIME-INR
INR: 1.2 (ref 0.8–1.2)
Prothrombin Time: 15.4 seconds — ABNORMAL HIGH (ref 11.4–15.2)

## 2019-09-01 MED ORDER — AMIODARONE HCL IN DEXTROSE 360-4.14 MG/200ML-% IV SOLN
30.0000 mg/h | INTRAVENOUS | Status: DC
  Administered 2019-09-01 (×2): 30 mg/h via INTRAVENOUS
  Filled 2019-09-01: qty 200

## 2019-09-01 MED ORDER — FUROSEMIDE 10 MG/ML IJ SOLN
40.0000 mg | Freq: Once | INTRAMUSCULAR | Status: AC
  Administered 2019-09-01: 40 mg via INTRAVENOUS
  Filled 2019-09-01: qty 4

## 2019-09-01 MED ORDER — AMIODARONE HCL 200 MG PO TABS: 200.0000 mg | ORAL_TABLET | Freq: Every day | ORAL | Status: DC

## 2019-09-01 MED ORDER — SODIUM CHLORIDE 0.9 % IV SOLN: 250.0000 mL | INTRAVENOUS | Status: DC | PRN

## 2019-09-01 MED ORDER — POTASSIUM CHLORIDE 10 MEQ/50ML IV SOLN
10.0000 meq | INTRAVENOUS | Status: AC
  Administered 2019-09-01 (×3): 10 meq via INTRAVENOUS
  Filled 2019-09-01 (×3): qty 50

## 2019-09-01 MED ORDER — SODIUM CHLORIDE 0.9% FLUSH: 3.0000 mL | INTRAVENOUS | Status: DC | PRN

## 2019-09-01 MED ORDER — AMIODARONE HCL IN DEXTROSE 360-4.14 MG/200ML-% IV SOLN: 60.0000 mg/h | INTRAVENOUS | Status: DC

## 2019-09-01 MED ORDER — SODIUM CHLORIDE 0.9% FLUSH
3.0000 mL | Freq: Two times a day (BID) | INTRAVENOUS | Status: DC
  Administered 2019-09-01 – 2019-09-05 (×7): 3 mL via INTRAVENOUS

## 2019-09-01 MED ORDER — AMIODARONE HCL IN DEXTROSE 360-4.14 MG/200ML-% IV SOLN
60.0000 mg/h | INTRAVENOUS | Status: AC
  Administered 2019-09-01 (×2): 60 mg/h via INTRAVENOUS
  Filled 2019-09-01: qty 200

## 2019-09-01 MED ORDER — AMIODARONE HCL IN DEXTROSE 360-4.14 MG/200ML-% IV SOLN
INTRAVENOUS | Status: AC
  Filled 2019-09-01: qty 200

## 2019-09-01 MED ORDER — AMIODARONE HCL IN DEXTROSE 360-4.14 MG/200ML-% IV SOLN: 30.0000 mg/h | INTRAVENOUS | Status: DC

## 2019-09-01 MED ORDER — AMIODARONE LOAD VIA INFUSION
150.0000 mg | Freq: Once | INTRAVENOUS | Status: AC
  Administered 2019-09-01: 150 mg via INTRAVENOUS
  Filled 2019-09-01: qty 83.34

## 2019-09-01 MED ORDER — AMIODARONE HCL 200 MG PO TABS: 200.0000 mg | ORAL_TABLET | Freq: Two times a day (BID) | ORAL | Status: DC

## 2019-09-01 MED ORDER — AMIODARONE LOAD VIA INFUSION: 150.0000 mg | Freq: Once | INTRAVENOUS | Status: DC

## 2019-09-01 MED ORDER — ~~LOC~~ CARDIAC SURGERY, PATIENT & FAMILY EDUCATION: Freq: Once | Status: AC

## 2019-09-01 NOTE — Progress Notes (Signed)
      [address]       [place] [address]             [phone]                 2 Days Post-Op Procedure(s) (LRB): MINIMALLY INVASIVE MITRAL VALVE REPAIR (MVR) USING MEMO 4D SIZE 40 (Right) TRANSESOPHAGEAL ECHOCARDIOGRAM (TEE) (N/A)   Events: No events _______________________________________________________________ Vitals: BP 115/68   Pulse 76   Temp 98.3 F (36.8 C) (Oral)   Resp 17   Ht 5\' 7"  (1.702 m)   Wt 94.8 kg   LMP  (LMP Unknown)   SpO2 100%   BMI 32.73 kg/m   - Neuro: alert NAD  - Cardiovascular: sinus  Drips: none.   PAP: (28-34)/(15-21) 32/17 CVP:  [11 mmHg-17 mmHg] 16 mmHg  - Pulm: EWOB, on [place]    ABG    Component Value Date/Time   PHART 7.354 08/30/2019 1458   PCO2ART 41.7 08/30/2019 1458   PO2ART 96.0 08/30/2019 1458   HCO3 23.4 08/30/2019 1458   TCO2 25 08/30/2019 1458   ACIDBASEDEF 2.0 08/30/2019 1458   O2SAT 97.0 08/30/2019 1458    - Abd: soft - Extremity: trace edema, warm  .Intake/Output      01/08 0701 - 01/09 0700 01/09 0701 - 01/10 0700   P.O. 450    I.V. (mL/kg) 415.7 (4.4)    Blood     IV Piggyback 220.4 100   Total Intake(mL/kg) 1086.2 (11.5) 100 (1.1)   Urine (mL/kg/hr) 780 (0.3) 150 (0.4)   Blood     Chest Tube 640    Total Output 1420 150   Net -333.9 -50           _______________________________________________________________ Labs: CBC Latest Ref Rng & Units 09/01/2019 08/31/2019 08/31/2019  WBC 4.0 - 10.5 K/uL 7.3 11.3(H) 9.2  Hemoglobin 12.0 - 15.0 g/dL 8.0(L) 8.7(L) 9.0(L)  Hematocrit 36.0 - 46.0 % 23.8(L) 25.9(L) 27.2(L)  Platelets 150 - 400 K/uL 83(L) 107(L) 133(L)   CMP Latest Ref Rng & Units 09/01/2019 08/31/2019 08/31/2019  Glucose 70 - 99 mg/dL 10/29/2019) 157(W) 620(B)  BUN 8 - 23 mg/dL 14 13 9   Creatinine 0.44 - 1.00 mg/dL 559(R) ) 4.16(L  Sodium 135 - 145 mmol/L 131(L) 131(L) 139  Potassium 3.5 - 5.1 mmol/L 3.6 4.0 4.0  Chloride 98 - 111 mmol/L 99 101 107  CO2 22 - 32 mmol/L 24 20(L)  24  Calcium 8.9 - 10.3 mg/dL 7.6(L) 7.4(L) 7.6(L)  Total Protein 6.5 - 8.1 g/dL - - -  Total Bilirubin 0.3 - 1.2 mg/dL - - -  Alkaline Phos 38 - 126 U/L - - -  AST 15 - 41 U/L - - -  ALT 0 - 44 U/L - - -    CXR: Atelectasis on the right   _______________________________________________________________  Assessment and Plan: POD 2 s/p mini mitral repair.  Doing well  Neuro: pain controlled CV: on coumadin.  Will keep drains for now Pulm: pulm toilet Renal: will diurese today GI: on diet Heme: stable ID: afebrile Endo: SSI  Dispo: floor today  8.45(X, MD 09/01/2019 11:06 AM

## 2019-09-01 NOTE — Progress Notes (Signed)
  Amiodarone Drug - Drug Interaction Consult Note  Recommendations: No medication changes needed  Low dose warfarin just started 1/8 - watch INR trend  Current tx: Rosuvastatin 20mg  qd  Amiodarone is metabolized by the cytochrome P450 system and therefore has the potential to cause many drug interactions. Amiodarone has an average plasma half-life of 50 days (range 20 to 100 days).   There is potential for drug interactions to occur several weeks or months after stopping treatment and the onset of drug interactions may be slow after initiating amiodarone.   [x]  Statins: Increased risk of myopathy. Simvastatin- restrict dose to 20mg  daily. Other statins: counsel patients to report any muscle pain or weakness immediately.    [x]  Anticoagulants: Amiodarone can increase anticoagulant effect. Consider warfarin dose reduction. Patients should be monitored closely and the dose of anticoagulant altered accordingly, remembering that amiodarone levels take several weeks to stabilize.     []  Antiepileptics: Amiodarone can increase plasma concentration of phenytoin, the dose should be reduced. Note that small changes in phenytoin dose can result in large changes in levels. Monitor patient and counsel on signs of toxicity.  []  Beta blockers: increased risk of bradycardia, AV block and myocardial depression. Sotalol - avoid concomitant use.  []   Calcium channel blockers (diltiazem and verapamil): increased risk of bradycardia, AV block and myocardial depression.  []   Cyclosporine: Amiodarone increases levels of cyclosporine. Reduced dose of cyclosporine is recommended.  []  Digoxin dose should be halved when amiodarone is started.  []  Diuretics: increased risk of cardiotoxicity if hypokalemia occurs.  []  Oral hypoglycemic agents (glyburide, glipizide, glimepiride): increased risk of hypoglycemia. Patient's glucose levels should be monitored closely when initiating amiodarone therapy.   []  Drugs that  prolong the QT interval:  Torsades de pointes risk may be increased with concurrent use - avoid if possible.  Monitor QTc, also keep magnesium/potassium WNL if concurrent therapy can't be avoided. Antibiotics: e.g. fluoroquinolones, erythromycin. . Antiarrhythmics: e.g. quinidine, procainamide, disopyramide, sotalol. . Antipsychotics: e.g. phenothiazines, haloperidol.  . Lithium, tricyclic antidepressants, and methadone.   Pharm.D. CPP, BCPS Clinical Pharmacist [phone] 09/01/2019 3:13 PM

## 2019-09-02 LAB — GLUCOSE, CAPILLARY
Glucose-Capillary: 132 mg/dL — ABNORMAL HIGH (ref 70–99)
Glucose-Capillary: 135 mg/dL — ABNORMAL HIGH (ref 70–99)
Glucose-Capillary: 137 mg/dL — ABNORMAL HIGH (ref 70–99)
Glucose-Capillary: 152 mg/dL — ABNORMAL HIGH (ref 70–99)
Glucose-Capillary: 172 mg/dL — ABNORMAL HIGH (ref 70–99)
Glucose-Capillary: 183 mg/dL — ABNORMAL HIGH (ref 70–99)
Glucose-Capillary: 219 mg/dL — ABNORMAL HIGH (ref 70–99)

## 2019-09-02 LAB — BASIC METABOLIC PANEL
Anion gap: 8 (ref 5–15)
BUN: 12 mg/dL (ref 8–23)
CO2: 26 mmol/L (ref 22–32)
Calcium: 7.8 mg/dL — ABNORMAL LOW (ref 8.9–10.3)
Chloride: 101 mmol/L (ref 98–111)
Creatinine, Ser: 0.76 mg/dL (ref 0.44–1.00)
GFR calc Af Amer: >60 mL/min (ref >60)
GFR calc non Af Amer: >60 mL/min (ref >60)
Glucose, Bld: 150 mg/dL — ABNORMAL HIGH (ref 70–99)
Potassium: 3.2 mmol/L — ABNORMAL LOW (ref 3.5–5.1)
Sodium: 135 mmol/L (ref 135–145)

## 2019-09-02 LAB — PROTIME-INR
INR: 1.1 (ref 0.8–1.2)
Prothrombin Time: 14.4 seconds (ref 11.4–15.2)

## 2019-09-02 LAB — CBC
HCT: 23.6 % — ABNORMAL LOW (ref 36.0–46.0)
Hemoglobin: 7.8 g/dL — ABNORMAL LOW (ref 12.0–15.0)
MCH: 32.1 pg (ref 26.0–34.0)
MCHC: 33.1 g/dL (ref 30.0–36.0)
MCV: 97.1 fL (ref 80.0–100.0)
Platelets: 94 10*3/uL — ABNORMAL LOW (ref 150–400)
RBC: 2.43 MIL/uL — ABNORMAL LOW (ref 3.87–5.11)
RDW: 14.6 % (ref 11.5–15.5)
WBC: 5.9 10*3/uL (ref 4.0–10.5)
nRBC: 0.3 % — ABNORMAL HIGH (ref 0.0–0.2)

## 2019-09-02 LAB — MAGNESIUM: Magnesium: 2.1 mg/dL (ref 1.7–2.4)

## 2019-09-02 MED ORDER — POTASSIUM CHLORIDE CRYS ER 20 MEQ PO TBCR
20.0000 meq | EXTENDED_RELEASE_TABLET | ORAL | Status: AC
  Administered 2019-09-02 (×3): 20 meq via ORAL
  Filled 2019-09-02 (×3): qty 1

## 2019-09-02 MED ORDER — POTASSIUM CHLORIDE CRYS ER 20 MEQ PO TBCR
40.0000 meq | EXTENDED_RELEASE_TABLET | Freq: Once | ORAL | Status: AC
  Administered 2019-09-02: 40 meq via ORAL
  Filled 2019-09-02: qty 2

## 2019-09-02 MED ORDER — FUROSEMIDE 40 MG PO TABS
40.0000 mg | ORAL_TABLET | Freq: Once | ORAL | Status: AC
  Administered 2019-09-02: 40 mg via ORAL
  Filled 2019-09-02: qty 1

## 2019-09-02 NOTE — Progress Notes (Signed)
Patient arrived to 4E room 25 at this time. Telemetry applied and CCMD notified. Chest tube clean dry and intact and placed to wall suction. Patient oriented to room and how to use call bell. Will continue to monitor.

## 2019-09-02 NOTE — Progress Notes (Signed)
      [address]       [place] [address]             [phone]                 3 Days Post-Op Procedure(s) (LRB): MINIMALLY INVASIVE MITRAL VALVE REPAIR (MVR) USING MEMO 4D SIZE 40 (Right) TRANSESOPHAGEAL ECHOCARDIOGRAM (TEE) (N/A)   Events: Converted back to sinus amio held for prolonged Qt _______________________________________________________________ Vitals: BP 115/70   Pulse 84   Temp 98.1 F (36.7 C) (Oral)   Resp 20   Ht 5\' 7"  (1.702 m)   Wt 93.8 kg   LMP  (LMP Unknown)   SpO2 96%   BMI 32.39 kg/m   - Neuro: alert NAD  - Cardiovascular: sinus  Drips: none.      - Pulm: EWOB    ABG    Component Value Date/Time   PHART 7.354 08/30/2019 1458   PCO2ART 41.7 08/30/2019 1458   PO2ART 96.0 08/30/2019 1458   HCO3 23.4 08/30/2019 1458   TCO2 25 08/30/2019 1458   ACIDBASEDEF 2.0 08/30/2019 1458   O2SAT 97.0 08/30/2019 1458    - Abd: soft - Extremity: trace edema, warm  .Intake/Output      01/09 0701 - 01/10 0700 01/10 0701 - 01/11 0700   P.O. 250 120   I.V. (mL/kg) 315.5 (3.4)    IV Piggyback 100    Total Intake(mL/kg) 665.5 (7.1) 120 (1.3)   Urine (mL/kg/hr) 3275 (1.5)    Chest Tube 290 80   Total Output 3565 80   Net -2899.5 +40           _______________________________________________________________ Labs: CBC Latest Ref Rng & Units 09/02/2019 09/01/2019 08/31/2019  WBC 4.0 - 10.5 K/uL 5.9 7.3 11.3(H)  Hemoglobin 12.0 - 15.0 g/dL 7.8(L) 8.0(L) 8.7(L)  Hematocrit 36.0 - 46.0 % 23.6(L) 23.8(L) 25.9(L)  Platelets 150 - 400 K/uL 94(L) 83(L) 107(L)   CMP Latest Ref Rng & Units 09/02/2019 09/01/2019 08/31/2019  Glucose 70 - 99 mg/dL 10/29/2019) 401(U) 272(Z)  BUN 8 - 23 mg/dL 12 14 13   Creatinine 0.44 - 1.00 mg/dL 366(Y ) 4.03)  Sodium 135 - 145 mmol/L 135 131(L) 131(L)  Potassium 3.5 - 5.1 mmol/L 3.2(L) 3.6 4.0  Chloride 98 - 111 mmol/L 101 99 101  CO2 22 - 32 mmol/L 26 24 20(L)  Calcium 8.9 - 10.3 mg/dL 7.8(L) 7.6(L) 7.4(L)   Total Protein 6.5 - 8.1 g/dL - - -  Total Bilirubin 0.3 - 1.2 mg/dL - - -  Alkaline Phos 38 - 126 U/L - - -  AST 15 - 41 U/L - - -  ALT 0 - 44 U/L - - -    CXR: pending  _______________________________________________________________  Assessment and Plan: POD 3 s/p mini mitral repair.  Doing well  Neuro: pain controlled CV: on coumadin.  Will keep drains for now.  Will hold amio for now Pulm: pulm toilet Renal: will diurese today GI: on diet Heme: stable ID: afebrile Endo: SSI  Dispo: floor today  4.74(Q, MD 09/02/2019 10:32 AM

## 2019-09-02 NOTE — Progress Notes (Addendum)
Per CCMD, patient's QTC ranges from 570 to 610. Last reading at 1641 of 579. Doree Fudge, PA paged at this time.   Per PA, discontinue protonix.

## 2019-09-02 NOTE — Progress Notes (Signed)
Patients QTc becoming increasingly prolonged > 0.50 since 1900  Last QTc 0.66  Dr. Cliffton Asters notified   Verbal orders to stop Amiodarone gtt.

## 2019-09-03 LAB — GLUCOSE, CAPILLARY
Glucose-Capillary: 154 mg/dL — ABNORMAL HIGH (ref 70–99)
Glucose-Capillary: 226 mg/dL — ABNORMAL HIGH (ref 70–99)
Glucose-Capillary: 183 mg/dL — ABNORMAL HIGH (ref 70–99)
Glucose-Capillary: 192 mg/dL — ABNORMAL HIGH (ref 70–99)
Glucose-Capillary: 183 mg/dL — ABNORMAL HIGH (ref 70–99)

## 2019-09-03 LAB — BASIC METABOLIC PANEL
Anion gap: 9 (ref 5–15)
BUN: 7 mg/dL — ABNORMAL LOW (ref 8–23)
CO2: 25 mmol/L (ref 22–32)
Calcium: 8.1 mg/dL — ABNORMAL LOW (ref 8.9–10.3)
Chloride: 106 mmol/L (ref 98–111)
Creatinine, Ser: 0.79 mg/dL (ref 0.44–1.00)
GFR calc Af Amer: >60 mL/min (ref >60)
GFR calc non Af Amer: >60 mL/min (ref >60)
Glucose, Bld: 131 mg/dL — ABNORMAL HIGH (ref 70–99)
Potassium: 3.5 mmol/L (ref 3.5–5.1)
Sodium: 140 mmol/L (ref 135–145)

## 2019-09-03 LAB — PROTIME-INR
INR: 1.2 (ref 0.8–1.2)
Prothrombin Time: 14.8 seconds (ref 11.4–15.2)

## 2019-09-03 LAB — TSH: TSH: 3.759 u[IU]/mL (ref 0.350–4.500)

## 2019-09-03 MED ORDER — FUROSEMIDE 40 MG PO TABS: 40.0000 mg | ORAL_TABLET | Freq: Every day | ORAL | Status: DC

## 2019-09-03 MED ORDER — GLUCERNA SHAKE PO LIQD
237.0000 mL | Freq: Three times a day (TID) | ORAL | Status: DC
  Administered 2019-09-03 – 2019-09-05 (×6): 237 mL via ORAL

## 2019-09-03 MED ORDER — POTASSIUM CHLORIDE CRYS ER 20 MEQ PO TBCR
40.0000 meq | EXTENDED_RELEASE_TABLET | Freq: Three times a day (TID) | ORAL | Status: AC
  Administered 2019-09-03 (×3): 40 meq via ORAL
  Filled 2019-09-03 (×3): qty 2

## 2019-09-03 MED ORDER — FUROSEMIDE 10 MG/ML IJ SOLN
40.0000 mg | Freq: Once | INTRAMUSCULAR | Status: AC
  Administered 2019-09-03: 40 mg via INTRAVENOUS
  Filled 2019-09-03: qty 4

## 2019-09-03 MED ORDER — AMIODARONE IV BOLUS ONLY 150 MG/100ML
150.0000 mg | Freq: Once | INTRAVENOUS | Status: AC
  Administered 2019-09-03: 150 mg via INTRAVENOUS
  Filled 2019-09-03: qty 100

## 2019-09-03 MED ORDER — POTASSIUM CHLORIDE CRYS ER 20 MEQ PO TBCR: 40.0000 meq | EXTENDED_RELEASE_TABLET | Freq: Two times a day (BID) | ORAL | Status: DC

## 2019-09-03 MED ORDER — WARFARIN SODIUM 5 MG PO TABS
5.0000 mg | ORAL_TABLET | Freq: Every day | ORAL | Status: DC
  Administered 2019-09-03: 5 mg via ORAL
  Filled 2019-09-03: qty 1

## 2019-09-03 MED ORDER — METOPROLOL TARTRATE 12.5 MG HALF TABLET
12.5000 mg | ORAL_TABLET | Freq: Two times a day (BID) | ORAL | Status: DC
  Administered 2019-09-03 – 2019-09-05 (×5): 12.5 mg via ORAL
  Filled 2019-09-03 (×5): qty 1

## 2019-09-03 MED ORDER — AMIODARONE HCL IN DEXTROSE 360-4.14 MG/200ML-% IV SOLN
60.0000 mg/h | INTRAVENOUS | Status: AC
  Administered 2019-09-03 (×2): 60 mg/h via INTRAVENOUS
  Filled 2019-09-03 (×2): qty 200

## 2019-09-03 MED ORDER — COUMADIN BOOK
Freq: Once | Status: AC
  Filled 2019-09-03: qty 1

## 2019-09-03 MED ORDER — AMIODARONE HCL IN DEXTROSE 360-4.14 MG/200ML-% IV SOLN
30.0000 mg/h | INTRAVENOUS | Status: DC
  Administered 2019-09-03 – 2019-09-04 (×2): 30 mg/h via INTRAVENOUS
  Filled 2019-09-03: qty 200

## 2019-09-03 MED ORDER — POTASSIUM CHLORIDE CRYS ER 20 MEQ PO TBCR
20.0000 meq | EXTENDED_RELEASE_TABLET | Freq: Every day | ORAL | Status: DC
  Administered 2019-09-04 – 2019-09-05 (×2): 20 meq via ORAL
  Filled 2019-09-03 (×2): qty 1

## 2019-09-03 MED ORDER — FE FUMARATE-B12-VIT C-FA-IFC PO CAPS
1.0000 | ORAL_CAPSULE | Freq: Every day | ORAL | Status: DC
  Administered 2019-09-03 – 2019-09-05 (×3): 1 via ORAL
  Filled 2019-09-03 (×3): qty 1

## 2019-09-03 MED ORDER — FUROSEMIDE 40 MG PO TABS
40.0000 mg | ORAL_TABLET | Freq: Every day | ORAL | Status: DC
  Administered 2019-09-04 – 2019-09-05 (×2): 40 mg via ORAL
  Filled 2019-09-03 (×2): qty 1

## 2019-09-03 NOTE — Progress Notes (Signed)
Pt walked in hallway with cardiac rehab. Cardiac rehab notified this RN of pt going into Afib. RN checked on pt and pt was asymptomatic, with heart rate ranging from 120-140s. RN administered 10 am medications, including metoprolol. Pt heart rate steadily decreasing to 120s. Pt denies symptoms at this time. Will continue to monitor.  Hazle Nordmann, RN

## 2019-09-03 NOTE — Anesthesia Postprocedure Evaluation (Signed)
Anesthesia Post Note  Patient: Bonnie Ryan  Procedure(s) Performed: MINIMALLY INVASIVE MITRAL VALVE REPAIR (MVR) USING MEMO 4D SIZE 40 (Right Chest) TRANSESOPHAGEAL ECHOCARDIOGRAM (TEE) (N/A )     Patient location during evaluation: SICU Anesthesia Type: General Level of consciousness: awake and alert Pain management: pain level controlled Vital Signs Assessment: post-procedure vital signs reviewed and stable Respiratory status: spontaneous breathing, nonlabored ventilation, respiratory function stable and patient connected to nasal cannula oxygen Cardiovascular status: blood pressure returned to baseline and stable Postop Assessment: no apparent nausea or vomiting Anesthetic complications: no    Last Vitals:  Vitals:   09/03/19 1400 09/03/19 1646  BP: 116/65 110/73  Pulse:    Resp: 20 19  Temp:  36.7 C  SpO2: 94% 95%    Last Pain:  Vitals:   09/03/19 1646  TempSrc: Oral  PainSc:                  [name]

## 2019-09-03 NOTE — Progress Notes (Signed)
CARDIAC REHAB PHASE I   PRE:  Rate/Rhythm: 99 SR  BP:  Supine:   Sitting: 133/76  Standing:    SaO2: 94%RA  MODE:  Ambulation: 120 ft   POST:  Rate/Rhythm: 114 ST to 140s afib  BP:  Supine:   Sitting: 132/89, 130/104  Standing:    SaO2: 95%RA 0900-0940 Pt stated her back was hurting from chest tube. Assisted to bathroom with rolling walker. Pt requested EVA because it is easier to roll. Had pt sit while I got EVA. Pt with DOE. She stated this was not new for her. Pt walked 120 ft on RA with EVA in hallway with HR at 114. When we returned and I assisted pt in bed she went into afib. HR to 140's at times. Took vitals and notified RN. Pt had received lasix before walk so she needed to use BSC to void. Assisted pt to [facility] and then back to bed. Chest tube to wall suction. Pt with visible SOB but sats at 95%. She denied wanting supplemental oxygen. Pt stated this has happened before . RN in room.    Luetta Nutting, RN BSN  09/03/2019 9:37 AM

## 2019-09-03 NOTE — Progress Notes (Signed)
Pt still in Afib with heart rate 115-125. PA notified. No new orders at this time.   Hazle Nordmann, RN

## 2019-09-03 NOTE — Progress Notes (Signed)
EPW removed per order. Wires clean and intact. VSS. Pt informed of bedrest for 1 hour. All questions answered. Pt denies needs at this time.  Hazle Nordmann, RN

## 2019-09-03 NOTE — Discharge Instructions (Addendum)
Discharge Instructions:  1. You may shower, please wash incisions daily with soap and water and keep dry.  If you wish to cover wounds with dressing you may do so but please keep clean and change daily.  No tub baths or swimming until incisions have completely healed.  If your incisions become red or develop any drainage please call our office at [phone]  2. No Driving until cleared by Dr. Guy Sandifer office and you are no longer using narcotic pain medications  3. Monitor your weight daily.. Please use the same scale and weigh at same time... If you gain 5-10 lbs in 48 hours with associated lower extremity swelling, please contact our office at [phone]  4. Fever of 101.5 for at least 24 hours with no source, please contact our office at [phone]  5. Activity- up as tolerated, please walk at least 3 times per day.  Avoid strenuous activity, no lifting, pushing, or pulling with your arms over 8-10 lbs for a minimum of 2 weeks   6. If any questions or concerns arise, please do not hesitate to contact our office at [phone]  Information on my medicine - Coumadin   (Warfarin)  This medication education was reviewed with me or my healthcare representative as part of my discharge preparation.  The pharmacist that spoke with me during my hospital stay was:  Werner Lean, [facility]  Why was Coumadin prescribed for you? Coumadin was prescribed for you because you have a blood clot or a medical condition that can cause an increased risk of forming blood clots. Blood clots can cause serious health problems by blocking the flow of blood to the heart, lung, or brain. Coumadin can prevent harmful blood clots from forming. As a reminder your indication for Coumadin is:   Blood Clot Prevention After Heart Valve Surgery  What test will check on my response to Coumadin? While on Coumadin (warfarin) you will need to have an INR test regularly to ensure that your dose is keeping you in the desired range.  The INR (international normalized ratio) number is calculated from the result of the laboratory test called prothrombin time (PT).  If an INR APPOINTMENT HAS NOT ALREADY BEEN MADE FOR YOU please schedule an appointment to have this lab work done by your health care provider within 7 days. Your INR goal is usually a number between:  2 to 3 or your provider may give you a more narrow range like 2-2.5.  Ask your health care provider during an office visit what your goal INR is.  What  do you need to  know  About  COUMADIN? Take Coumadin (warfarin) exactly as prescribed by your healthcare provider about the same time each day.  DO NOT stop taking without talking to the doctor who prescribed the medication.  Stopping without other blood clot prevention medication to take the place of Coumadin may increase your risk of developing a new clot or stroke.  Get refills before you run out.  What do you do if you miss a dose? If you miss a dose, take it as soon as you remember on the same day then continue your regularly scheduled regimen the next day.  Do not take two doses of Coumadin at the same time.  Important Safety Information A possible side effect of Coumadin (Warfarin) is an increased risk of bleeding. You should call your healthcare provider right away if you experience any of the following: ? Bleeding from an injury or your nose that  does not stop. ? Unusual colored urine (red or dark brown) or unusual colored stools (red or black). ? Unusual bruising for unknown reasons. ? A serious fall or if you hit your head (even if there is no bleeding).  Some foods or medicines interact with Coumadin (warfarin) and might alter your response to warfarin. To help avoid this: ? Eat a balanced diet, maintaining a consistent amount of Vitamin K. ? Notify your provider about major diet changes you plan to make. ? Avoid alcohol or limit your intake to 1 drink for women and 2 drinks for men per day. (1 drink is  5 oz. wine, 12 oz. beer, or 1.5 oz. liquor.)  Make sure that ANY health care provider who prescribes medication for you knows that you are taking Coumadin (warfarin).  Also make sure the healthcare provider who is monitoring your Coumadin knows when you have started a new medication including herbals and non-prescription products.  Coumadin (Warfarin)  Major Drug Interactions  Increased Warfarin Effect Decreased Warfarin Effect  Alcohol (large quantities) Antibiotics (esp. Septra/Bactrim, Flagyl, Cipro) Amiodarone (Cordarone) Aspirin (ASA) Cimetidine (Tagamet) Megestrol (Megace) NSAIDs (ibuprofen, naproxen, etc.) Piroxicam (Feldene) Propafenone (Rythmol SR) Propranolol (Inderal) Isoniazid (INH) Posaconazole (Noxafil) Barbiturates (Phenobarbital) Carbamazepine (Tegretol) Chlordiazepoxide (Librium) Cholestyramine (Questran) Griseofulvin Oral Contraceptives Rifampin Sucralfate (Carafate) Vitamin K   Coumadin (Warfarin) Major Herbal Interactions  Increased Warfarin Effect Decreased Warfarin Effect  Garlic Ginseng Ginkgo biloba Coenzyme Q10 Green tea St. John's wort    Coumadin (Warfarin) FOOD Interactions  Eat a consistent number of servings per week of foods HIGH in Vitamin K (1 serving =  cup)  Collards (cooked, or boiled & drained) Kale (cooked, or boiled & drained) Mustard greens (cooked, or boiled & drained) Parsley *serving size only =  cup Spinach (cooked, or boiled & drained) Swiss chard (cooked, or boiled & drained) Turnip greens (cooked, or boiled & drained)  Eat a consistent number of servings per week of foods MEDIUM-HIGH in Vitamin K (1 serving = 1 cup)  Asparagus (cooked, or boiled & drained) Broccoli (cooked, boiled & drained, or raw & chopped) Brussel sprouts (cooked, or boiled & drained) *serving size only =  cup Lettuce, raw (green leaf, endive, romaine) Spinach, raw Turnip greens, raw & chopped   These websites have more information on  Coumadin (warfarin):  http://www.king-russell.com/; https://www.hines.net/;

## 2019-09-03 NOTE — Progress Notes (Signed)
Transitions of Care Pharmacist Note  Bonnie Ryan is a 64 y.o. female that has been diagnosed with minimally invasive mitral valve repair and will be prescribed Coumadin (warfarin)  at discharge.   Patient Education: I provided the following education on warfarin 1/11 to the patient: How to take the medication Described what the medication is Signs of bleeding Signs/symptoms of VTE and stroke  Answered their questions Dietary and medication precautions with warfarin   Discharge Medications Plan: The patient wants to have their discharge medications filled by the Transitions of Care pharmacy rather than their usual pharmacy.  The discharge orders pharmacy has been changed to the Transitions of Care pharmacy, the patient will receive a phone call regarding co-pay, and their medications will be delivered by the Transitions of Care pharmacy.    Thank you,   Alvia Grove, PharmD PGY1 Acute Care Pharmacy Resident September 03, 2019

## 2019-09-03 NOTE — Progress Notes (Addendum)
[address]       [place] [address]             [phone]        4 Days Post-Op Procedure(s) (LRB): MINIMALLY INVASIVE MITRAL VALVE REPAIR (MVR) USING MEMO 4D SIZE 40 (Right) TRANSESOPHAGEAL ECHOCARDIOGRAM (TEE) (N/A)  Subjective: Patient states "food does not taste good". She denies nausea, abdominal pain, and has had a bowel movement. She also has pain at chest tube sites and "hard to take a deep breath with tubes in".  Objective: Vital signs in last 24 hours: Temp:  [98 F (36.7 C)-99.2 F (37.3 C)] 99.2 F (37.3 C) (01/11 0433) Pulse Rate:  [77-91] 88 (01/10 2355) Cardiac Rhythm: Normal sinus rhythm;Other (Comment) (01/11 0410) Resp:  [16-24] 16 (01/11 0433) BP: (104-137)/(58-79) 120/72 (01/11 0433) SpO2:  [93 %-100 %] 96 % (01/11 0433) Weight:  [92.2 kg] 92.2 kg (01/11 0433)  Pre op weight 85.8 kg Current Weight  09/03/19 92.2 kg      Intake/Output from previous day: 01/10 0701 - 01/11 0700 In: 240 [P.O.:240] Out: 590 [Urine:450; Chest Tube:140]   Physical Exam:  Cardiovascular: RRR, no murmur Pulmonary: Diminished bibasilar breath sounds R>L Abdomen: Soft, non tender, bowel sounds present. Extremities: Mild bilateral lower extremity edema. Wounds: Clean and dry.  No erythema or signs of infection. Chest tubes: to suction, no air leak  Lab Results: CBC: Recent Labs    09/01/19 0231 09/02/19 0327  WBC 7.3 5.9  HGB 8.0* 7.8*  HCT 23.8* 23.6*  PLT 83* 94*   BMET:  Recent Labs    09/02/19 0327 09/03/19 0152  NA 135 140  K 3.2* 3.5  CL 101 106  CO2 26 25  GLUCOSE 150* 131*  BUN 12 7*  CREATININE 0.76 0.79  CALCIUM 7.8* 8.1*    PT/INR:  Lab Results  Component Value Date   INR 1.2 09/03/2019   INR 1.1 09/02/2019   INR 1.2 09/01/2019   ABG:  INR: Will add last result for INR, ABG once components are confirmed Will add last 4 CBG results once components are confirmed  Assessment/Plan:  1. CV - Has had  prolonged QTc last few days. QTc this am down to 360. Continue avoidance of medications that would prolong this and is NOT on Amiodarone. SR. On Coumadin. INR this am 1.2 and has not increased with 3 doses of Coumadin 2.5 mg so will increase to 5 mg. Also, she is not on a BB so will start low dose Lopressor. 2.  Pulmonary - On room air. Encourage incentive spirometer. Chest tubes with 140 cc last 12 hours. Possibly remove chest tubes. No CXR ordered for today so will order for am. Encourage incentive spirometer 3. Volume Overload - Will give Lasix 40 mg IV today and orally in am 4.  Acute blood loss anemia - H and H yesterday 7.8 and 23.6. Will start Trinsicon and re check CBC in am 5. DM-CBGs 135/132/154. Pre op HGA1C 7. On Insulin. Will restart Metformin (creatinine WNL) once tolerating oral better. 6. Supplement potassium 7.GI-encourage oral intake. Patient willing to try Glucerna  Donielle M ZimmermanPA-C 09/03/2019,6:57 AM   I have seen and examined the patient and agree with the assessment and plan as outlined.  D/C pacing wires and chest tubes.  Start beta blocker.  Give lasix IV today.  Supplement potassium.  Possible D/C home 1-2 days.  Purcell Nails, MD 09/03/2019 8:19 AM

## 2019-09-03 NOTE — Progress Notes (Signed)
Chest tubes removed per order. Pt tolerated well.   Hazle Nordmann, RN

## 2019-09-04 ENCOUNTER — Inpatient Hospital Stay (HOSPITAL_COMMUNITY): Payer: Managed Care, Other (non HMO)

## 2019-09-04 LAB — CBC
HCT: 26.6 % — ABNORMAL LOW (ref 36.0–46.0)
Hemoglobin: 8.7 g/dL — ABNORMAL LOW (ref 12.0–15.0)
MCH: 32.5 pg (ref 26.0–34.0)
MCHC: 32.7 g/dL (ref 30.0–36.0)
MCV: 99.3 fL (ref 80.0–100.0)
Platelets: 150 10*3/uL (ref 150–400)
RBC: 2.68 MIL/uL — ABNORMAL LOW (ref 3.87–5.11)
RDW: 15.4 % (ref 11.5–15.5)
WBC: 6.2 10*3/uL (ref 4.0–10.5)
nRBC: 0.6 % — ABNORMAL HIGH (ref 0.0–0.2)

## 2019-09-04 LAB — BASIC METABOLIC PANEL
Anion gap: 9 (ref 5–15)
BUN: 8 mg/dL (ref 8–23)
CO2: 27 mmol/L (ref 22–32)
Calcium: 8.4 mg/dL — ABNORMAL LOW (ref 8.9–10.3)
Chloride: 103 mmol/L (ref 98–111)
Creatinine, Ser: 0.85 mg/dL (ref 0.44–1.00)
GFR calc Af Amer: >60 mL/min (ref >60)
GFR calc non Af Amer: >60 mL/min (ref >60)
Glucose, Bld: 127 mg/dL — ABNORMAL HIGH (ref 70–99)
Potassium: 4.3 mmol/L (ref 3.5–5.1)
Sodium: 139 mmol/L (ref 135–145)

## 2019-09-04 LAB — PROTIME-INR
INR: 1.2 (ref 0.8–1.2)
Prothrombin Time: 14.6 seconds (ref 11.4–15.2)

## 2019-09-04 LAB — GLUCOSE, CAPILLARY
Glucose-Capillary: 117 mg/dL — ABNORMAL HIGH (ref 70–99)
Glucose-Capillary: 125 mg/dL — ABNORMAL HIGH (ref 70–99)
Glucose-Capillary: 142 mg/dL — ABNORMAL HIGH (ref 70–99)
Glucose-Capillary: 150 mg/dL — ABNORMAL HIGH (ref 70–99)
Glucose-Capillary: 169 mg/dL — ABNORMAL HIGH (ref 70–99)
Glucose-Capillary: 195 mg/dL — ABNORMAL HIGH (ref 70–99)

## 2019-09-04 MED ORDER — AMIODARONE HCL 200 MG PO TABS
200.0000 mg | ORAL_TABLET | Freq: Two times a day (BID) | ORAL | Status: DC
  Administered 2019-09-04 – 2019-09-05 (×3): 200 mg via ORAL
  Filled 2019-09-04 (×4): qty 1

## 2019-09-04 MED ORDER — INSULIN ASPART 100 UNIT/ML ~~LOC~~ SOLN: 0.0000 [IU] | SUBCUTANEOUS | Status: DC

## 2019-09-04 MED ORDER — WARFARIN SODIUM 5 MG PO TABS
5.0000 mg | ORAL_TABLET | Freq: Every day | ORAL | Status: DC
  Administered 2019-09-04: 5 mg via ORAL
  Filled 2019-09-04: qty 1

## 2019-09-04 MED ORDER — INSULIN ASPART 100 UNIT/ML ~~LOC~~ SOLN
0.0000 [IU] | Freq: Three times a day (TID) | SUBCUTANEOUS | Status: DC
  Administered 2019-09-04: 4 [IU] via SUBCUTANEOUS
  Administered 2019-09-04 – 2019-09-05 (×2): 2 [IU] via SUBCUTANEOUS

## 2019-09-04 MED ORDER — AMIODARONE HCL 200 MG PO TABS: 200.0000 mg | ORAL_TABLET | Freq: Two times a day (BID) | ORAL | Status: DC

## 2019-09-04 NOTE — Progress Notes (Signed)
CARDIAC REHAB PHASE I   PRE:  Rate/Rhythm: 75 SR  BP:  Supine: 107/69  Sitting:   Standing:    SaO2: 92%RA  MODE:  Ambulation: 400 ft   POST:  Rate/Rhythm: 88 SR  BP:  Supine:   Sitting: 108/69  Standing:    SaO2: 94%RA 1007-1050 Pt walked 400 ft with rolling walker after going to bathroom. Pt tolerated much better than yesterday. Remained in NSR. Tolerated well. Still a little SOB. To recliner after walk. Feels much better since chest tubes out.   Luetta Nutting, RN BSN  09/04/2019 10:50 AM

## 2019-09-04 NOTE — Progress Notes (Addendum)
      [address]       [place] [address]             [phone]      5 Days Post-Op Procedure(s) (LRB): MINIMALLY INVASIVE MITRAL VALVE REPAIR (MVR) USING MEMO 4D SIZE 40 (Right) TRANSESOPHAGEAL ECHOCARDIOGRAM (TEE) (N/A)   Subjective:  Patient states she feels much better after chest tube removal.  She states its like " night and day".  + ambulation  + BM.. hoping to go home today  Objective: Vital signs in last 24 hours: Temp:  [98 F (36.7 C)-98.8 F (37.1 C)] 98 F (36.7 C) (01/12 0355) Pulse Rate:  [126] 126 (01/11 0927) Cardiac Rhythm: Normal sinus rhythm (01/11 2301) Resp:  [15-20] 15 (01/12 0355) BP: (95-135)/(65-77) 107/69 (01/12 0355) SpO2:  [91 %-100 %] 92 % (01/12 0355) Weight:  [89.6 kg] 89.6 kg (01/12 0355)  Intake/Output from previous day: 01/11 0701 - 01/12 0700 In: 2107.9 [P.O.:720; I.V.:1387.9] Out: 2012 [Urine:2000; Chest Tube:12]  General appearance: alert, cooperative and no distress Heart: regular rate and rhythm Lungs: clear to auscultation bilaterally Abdomen: soft, non-tender; bowel sounds normal; no masses,  no organomegaly Extremities: edema trace Wound: clean and dry  Lab Results: Recent Labs    09/02/19 0327 09/04/19 0343  WBC 5.9 6.2  HGB 7.8* 8.7*  HCT 23.6* 26.6*  PLT 94* 150   BMET:  Recent Labs    09/03/19 0152 09/04/19 0343  NA 140 139  K 3.5 4.3  CL 106 103  CO2 25 27  GLUCOSE 131* 127*  BUN 7* 8  CREATININE 0.79 0.85  CALCIUM 8.1* 8.4*    PT/INR:  Recent Labs    09/04/19 0343  LABPROT 14.6  INR 1.2   ABG    Component Value Date/Time   PHART 7.354 08/30/2019 1458   HCO3 23.4 08/30/2019 1458   TCO2 25 08/30/2019 1458   ACIDBASEDEF 2.0 08/30/2019 1458   O2SAT 97.0 08/30/2019 1458   CBG (last 3)  Recent Labs    09/03/19 2006 09/04/19 0010 09/04/19 0422  GLUCAP 183* 125* 117*    Assessment/Plan: S/P Procedure(s) (LRB): MINIMALLY INVASIVE MITRAL VALVE REPAIR (MVR) USING MEMO  4D SIZE 40 (Right) TRANSESOPHAGEAL ECHOCARDIOGRAM (TEE) (N/A)  1. CV- PAF, maintaining NSR- convert IV Amiodarone to oral 200 mg BID, continue Lopressor at 12.5 mg BID.Burnard Leigh  Is 536 2. INR is 1.2- will continue coumadin at 5 mg daily 3. Renal-creatinine stable, weight is trending down, continue Lasix, potassium 4. DM- sugars controlled, will transition to home diabetic medications at discharge 5. Dispo- patient stable, maintaining NSR, Qtc is 536... continue Lopressor, Amiodarone, increase coumadin, possibly ready for d/c today vs tomorrow, will defer to Dr. Cornelius Moras    LOS: 5 days    Lowella Dandy, PA-C  09/04/2019   I have seen and examined the patient and agree with the assessment and plan as outlined.  Check 12-lead EKG and discuss w/ cardiology team whether or not to continue amiodarone.  Plan d/c home tomorrow if no further Afib.  Purcell Nails, MD 09/04/2019 9:57 AM    Discussed w/ Dr Johney Frame who recommended continuing amiodarone and send patient home on 200 mg/day with plans for f/u 12-lead EKG in 1 week in the atrial fibrillation clinic.   Purcell Nails, MD 09/04/2019 3:17 PM

## 2019-09-05 LAB — PROTIME-INR
INR: 1.2 (ref 0.8–1.2)
Prothrombin Time: 15.2 seconds (ref 11.4–15.2)

## 2019-09-05 LAB — GLUCOSE, CAPILLARY: Glucose-Capillary: 145 mg/dL — ABNORMAL HIGH (ref 70–99)

## 2019-09-05 MED ORDER — OXYCODONE HCL 5 MG PO TABS: 5.0000 mg | ORAL_TABLET | ORAL | 0 refills | Status: DC | PRN

## 2019-09-05 MED ORDER — FERROUS SULFATE 325 (65 FE) MG PO TABS: 325.0000 mg | ORAL_TABLET | Freq: Every day | ORAL | 0 refills | Status: DC

## 2019-09-05 MED ORDER — POTASSIUM CHLORIDE CRYS ER 20 MEQ PO TBCR: 20.0000 meq | EXTENDED_RELEASE_TABLET | Freq: Every day | ORAL | 0 refills | Status: DC

## 2019-09-05 MED ORDER — ROSUVASTATIN CALCIUM 20 MG PO TABS: 20.0000 mg | ORAL_TABLET | Freq: Every day | ORAL | Status: DC

## 2019-09-05 MED ORDER — FUROSEMIDE 40 MG PO TABS: 40.0000 mg | ORAL_TABLET | Freq: Every day | ORAL | 0 refills | Status: DC

## 2019-09-05 MED ORDER — WARFARIN SODIUM 5 MG PO TABS: 5.0000 mg | ORAL_TABLET | Freq: Every day | ORAL | 1 refills | Status: DC

## 2019-09-05 MED ORDER — AMIODARONE HCL 200 MG PO TABS: 200.0000 mg | ORAL_TABLET | Freq: Every day | ORAL | 1 refills | Status: DC

## 2019-09-05 MED ORDER — METOPROLOL TARTRATE 25 MG PO TABS: 12.5000 mg | ORAL_TABLET | Freq: Two times a day (BID) | ORAL | 1 refills | Status: DC

## 2019-09-05 MED FILL — POTASSIUM CHLORIDE 20meq ER: 20 | 7 days supply | Qty: 7 | Fill #0

## 2019-09-05 MED FILL — METOPROLOL TARTRATE 25 MG T: 25 | 30 days supply | Qty: 30 | Fill #0

## 2019-09-05 MED FILL — FUROSEMIDE 40 MG TABLET: 40 | 7 days supply | Qty: 7 | Fill #0

## 2019-09-05 MED FILL — oxyCODONE HCL 5 MG TABS: 5 | 5 days supply | Qty: 30 | Fill #0

## 2019-09-05 MED FILL — AMIODARONE HCL 200 MG TAB: 200 | 30 days supply | Qty: 30 | Fill #0

## 2019-09-05 MED FILL — WARFARIN SODIUM 5 MG TABLET: 5 | 30 days supply | Qty: 30 | Fill #0

## 2019-09-05 NOTE — Progress Notes (Addendum)
      [address]       [place] [address]             [phone]        6 Days Post-Op Procedure(s) (LRB): MINIMALLY INVASIVE MITRAL VALVE REPAIR (MVR) USING MEMO 4D SIZE 40 (Right) TRANSESOPHAGEAL ECHOCARDIOGRAM (TEE) (N/A)  Subjective: Patient without specific complaints this am. She hopes to go home.  Objective: Vital signs in last 24 hours: Temp:  [97.6 F (36.4 C)-98.9 F (37.2 C)] 98.9 F (37.2 C) (01/13 0358) Pulse Rate:  [77-89] 89 (01/12 1800) Cardiac Rhythm: Normal sinus rhythm;Heart block (01/13 0045) Resp:  [12-21] 12 (01/13 0500) BP: (102-135)/(67-81) 102/70 (01/13 0358) SpO2:  [92 %-99 %] 92 % (01/13 0358) Weight:  [88.5 kg] 88.5 kg (01/13 0500)  Pre op weight 85.8 kg Current Weight  09/05/19 88.5 kg      Intake/Output from previous day: 01/12 0701 - 01/13 0700 In: 843 [P.O.:840; I.V.:3] Out: 200 [Urine:200]   Physical Exam:  Cardiovascular: RRR, no murmur Pulmonary: Slightly diminished bibasilar breath sounds R>L Abdomen: Soft, non tender, bowel sounds present. Extremities: Mild bilateral lower extremity edema. Wounds: Clean and dry.  No erythema or signs of infection.   Lab Results: CBC: Recent Labs    09/04/19 0343  WBC 6.2  HGB 8.7*  HCT 26.6*  PLT 150   BMET:  Recent Labs    09/03/19 0152 09/04/19 0343  NA 140 139  K 3.5 4.3  CL 106 103  CO2 25 27  GLUCOSE 131* 127*  BUN 7* 8  CREATININE 0.79 0.85  CALCIUM 8.1* 8.4*    PT/INR:  Lab Results  Component Value Date   INR 1.2 09/05/2019   INR 1.2 09/04/2019   INR 1.2 09/03/2019   ABG:  INR: Will add last result for INR, ABG once components are confirmed Will add last 4 CBG results once components are confirmed  Assessment/Plan:  1. CV - PAF. Maintaining SR, first degree heart block. On Amiodarone 200 mg bid, Lopressor 12.5 mg bid and Coumadin. INR this am remains 1.2 despite increasing Coumadin to 5 mg. As discussed with Dr. Cornelius Moras, continue 5 mg  at discharge. Patient will need to call for appointment for a fib clinic, if not already made, to get 12 lead EKG in one week. 2. Pulmonary-On room air. Encourage incentive spirometer.Encourage incentive spirometer 3. Volume Overload - Will continue Lasix 40 mg daily 4.  Acute blood loss anemia - Last H and H yesterday 8.7 and 26.6. On Trinsicon. 5. DM-CBGs 169/195/145. Pre op HGA1C 7. On Insulin. Will restart Metformin (creatinine WNL) at discharge. 6. Discharge  Lelon Huh [facility] 09/05/2019,7:05 AM   I have seen and examined the patient and agree with the assessment and plan as outlined.  D/C home today.  Check 12-lead EKG in Afib clinic in 1 week.  Purcell Nails, MD 09/05/2019 8:01 AM

## 2019-09-05 NOTE — Progress Notes (Signed)
[id number] Discussed with pt the importance of IS and walking for recovery. Gave ex ed. Gave diabetic and heart healthy diets. Pt has information on Coumadin. Offered video but pt stated will read booklet. Pt does not think she will need walker for home use. Reviewed restrictions and wound care. Discussed CRP 2 and referral to [facility] made.  Pt is interested in participating in Virtual Cardiac and Pulmonary Rehab. Pt advised that Virtual Cardiac and Pulmonary Rehab is provided at no cost to the patient.  Checklist:  1. Pt has smart device  ie smartphone and/or ipad for downloading an app  Yes 2. Reliable internet/wifi service    Yes 3. Understands how to use their smartphone and navigate within an app.  Yes   Pt verbalized understanding and is in agreement.

## 2019-09-05 NOTE — Progress Notes (Signed)
Discharge teaching completed. All medications and follow up appointments discussed. All questions answered. IV out. Monitor off CCMD notified. Discharging to home.  Ginette Otto, RN

## 2019-09-06 ENCOUNTER — Ambulatory Visit: Payer: Managed Care, Other (non HMO) | Admitting: Nurse Practitioner

## 2019-09-06 LAB — BPAM RBC
Blood Product Expiration Date: 202101302359
Blood Product Expiration Date: 202101302359
ISSUE DATE / TIME: 202101070814
ISSUE DATE / TIME: 202101070814
Unit Type and Rh: 6200
Unit Type and Rh: 6200

## 2019-09-06 LAB — TYPE AND SCREEN
ABO/RH(D): AB POS
Antibody Screen: NEGATIVE
Unit division: 0
Unit division: 0

## 2019-09-07 ENCOUNTER — Ambulatory Visit (INDEPENDENT_AMBULATORY_CARE_PROVIDER_SITE_OTHER): Payer: Managed Care, Other (non HMO) | Admitting: *Deleted

## 2019-09-07 ENCOUNTER — Other Ambulatory Visit: Payer: Self-pay

## 2019-09-07 ENCOUNTER — Telehealth: Payer: Self-pay | Admitting: Internal Medicine

## 2019-09-07 DIAGNOSIS — Z5181 Encounter for therapeutic drug level monitoring: Secondary | ICD-10-CM

## 2019-09-07 DIAGNOSIS — Z9889 Other specified postprocedural states: Secondary | ICD-10-CM | POA: Diagnosis not present

## 2019-09-07 DIAGNOSIS — I4891 Unspecified atrial fibrillation: Secondary | ICD-10-CM | POA: Diagnosis not present

## 2019-09-07 LAB — POCT INR: INR: 1.3 — AB (ref 2.0–3.0)

## 2019-09-07 NOTE — Telephone Encounter (Signed)
Patient calling in stating that she was recently dc'ed from hospital and that Dr. Okey Dupre said maybe to wait a few weeks before coming in for a follow up. Patient is scheduled for 1/20 as of now. I did not see it documented where Dr. Okey Dupre made that recommendation, please advise whether to keep or push out appointment

## 2019-09-07 NOTE — Telephone Encounter (Signed)
Patient has appointments with Afib clinic and Dr Cornelius Moras over the next 2 weeks.  She would like to push cardiology appointment out a week or so as her office appointment with [place] was the day after the appointment with AFib clinic. Rescheduled her to see Ward Givens, NP on 09/27/19. Patient was appreciative.

## 2019-09-07 NOTE — Patient Instructions (Addendum)
A full discussion of the nature of anticoagulants has been carried out.  A benefit risk analysis has been presented to the patient, so that they understand the justification for choosing anticoagulation at this time. The need for frequent and regular monitoring, precise dosage adjustment and compliance is stressed.  Side effects of potential bleeding are discussed.  The patient should avoid any OTC items containing aspirin or ibuprofen, and should avoid great swings in general diet.  Avoid alcohol consumption.  Call if any signs of abnormal bleeding.   Description   Today take 1.5 tablets then start taking 1 tablet daily except 1.5 tablets on Sundays. until next appt. Recheck INR in 1 week. [facility] Coumadin [facility] Office [phone]

## 2019-09-11 ENCOUNTER — Ambulatory Visit (HOSPITAL_COMMUNITY)
Admit: 2019-09-11 | Discharge: 2019-09-11 | Disposition: A | Discharge status: Home / Self Care | Payer: Managed Care, Other (non HMO) | Source: Ambulatory Visit | Attending: Nurse Practitioner | Admitting: Nurse Practitioner

## 2019-09-11 ENCOUNTER — Encounter (HOSPITAL_COMMUNITY): Payer: Self-pay | Admitting: Nurse Practitioner

## 2019-09-11 ENCOUNTER — Ambulatory Visit (INDEPENDENT_AMBULATORY_CARE_PROVIDER_SITE_OTHER): Payer: Managed Care, Other (non HMO) | Admitting: *Deleted

## 2019-09-11 ENCOUNTER — Other Ambulatory Visit: Payer: Self-pay

## 2019-09-11 VITALS — BP 130/64 | HR 110 | Ht 67.0 in | Wt 183.0 lb

## 2019-09-11 DIAGNOSIS — Z7982 Long term (current) use of aspirin: Secondary | ICD-10-CM | POA: Diagnosis not present

## 2019-09-11 DIAGNOSIS — Z7984 Long term (current) use of oral hypoglycemic drugs: Secondary | ICD-10-CM | POA: Diagnosis not present

## 2019-09-11 DIAGNOSIS — I10 Essential (primary) hypertension: Secondary | ICD-10-CM | POA: Diagnosis not present

## 2019-09-11 DIAGNOSIS — Z79899 Other long term (current) drug therapy: Secondary | ICD-10-CM | POA: Insufficient documentation

## 2019-09-11 DIAGNOSIS — D6869 Other thrombophilia: Secondary | ICD-10-CM | POA: Diagnosis not present

## 2019-09-11 DIAGNOSIS — I2510 Atherosclerotic heart disease of native coronary artery without angina pectoris: Secondary | ICD-10-CM | POA: Diagnosis not present

## 2019-09-11 DIAGNOSIS — Z880 Allergy status to penicillin: Secondary | ICD-10-CM | POA: Insufficient documentation

## 2019-09-11 DIAGNOSIS — Z8249 Family history of ischemic heart disease and other diseases of the circulatory system: Secondary | ICD-10-CM | POA: Insufficient documentation

## 2019-09-11 DIAGNOSIS — I4891 Unspecified atrial fibrillation: Secondary | ICD-10-CM | POA: Diagnosis present

## 2019-09-11 DIAGNOSIS — Z9889 Other specified postprocedural states: Secondary | ICD-10-CM

## 2019-09-11 DIAGNOSIS — Z87891 Personal history of nicotine dependence: Secondary | ICD-10-CM | POA: Insufficient documentation

## 2019-09-11 DIAGNOSIS — Z833 Family history of diabetes mellitus: Secondary | ICD-10-CM | POA: Diagnosis not present

## 2019-09-11 DIAGNOSIS — Z803 Family history of malignant neoplasm of breast: Secondary | ICD-10-CM | POA: Diagnosis not present

## 2019-09-11 DIAGNOSIS — E7800 Pure hypercholesterolemia, unspecified: Secondary | ICD-10-CM | POA: Diagnosis not present

## 2019-09-11 DIAGNOSIS — E119 Type 2 diabetes mellitus without complications: Secondary | ICD-10-CM | POA: Diagnosis not present

## 2019-09-11 DIAGNOSIS — Z5181 Encounter for therapeutic drug level monitoring: Secondary | ICD-10-CM

## 2019-09-11 DIAGNOSIS — Z7901 Long term (current) use of anticoagulants: Secondary | ICD-10-CM | POA: Diagnosis not present

## 2019-09-11 LAB — POCT INR: INR: 1.9 — AB (ref 2.0–3.0)

## 2019-09-11 MED ORDER — METOPROLOL TARTRATE 25 MG PO TABS: 25.0000 mg | ORAL_TABLET | Freq: Two times a day (BID) | ORAL | 1 refills | Status: DC

## 2019-09-11 MED ORDER — AMIODARONE HCL 200 MG PO TABS: 200.0000 mg | ORAL_TABLET | Freq: Two times a day (BID) | ORAL | 1 refills | Status: DC

## 2019-09-11 NOTE — Patient Instructions (Signed)
Description   Start taking 1 tablet daily except 1.5 tablets on Sundays and Wednesdays. Recheck INR in 1 week. Continue Glucerna daily. [facility] Coumad[facility] Office [phone]

## 2019-09-11 NOTE — Progress Notes (Signed)
Primary Care Physician: Jodelle Green, FNP Referring Physician: Dr. Rayann Heman  Cardiac Surgeon:   Dr. Chanda Busing Bonnie Ryan is a 64 y.o. female with a h/o HTN, T2 DM, non obstructive CAD, MVP that had minimally invasive MV repari 08/30/19. Her post op course was complicated by new onset afib with RVR. SHe was started on IV amiodarone and converted to SR, but qt prolonged and drug was stopped. She  was d/c on 200 mg amiodarone po after consulting with Dr. Rayann Heman  She was asked to f/u in the afib clinic and is in afib today with a v rate of 110 bpm. She  is very tired. She is walking in her hall at home for exercise but is worn out afterward. Her fluid status appears stable.  INR 1.3 on the 15th and 1.9 today. Continues on warfarin with a CHA2DS2VASc score of at least 5.   Today, she denies symptoms of palpitations, chest pain, mild shortness of breath, orthopnea, PND, no lower extremity edema, dizziness, presyncope, syncope, or neurologic sequela + fatigue . The patient is tolerating medications without difficulties and is otherwise without complaint today.   Past Medical History:  Diagnosis Date  . CAD (coronary artery disease)    Nonbstructive CAD by cath 09/2006 30% LAD, 25% RCA. Normal EF of 55-60% by echo 10/2009 with normal wall thickness.; ETT-Myoview 07/28/11: Normal without ischemia, EF 75%. ; LHC 12/12:   LM calcified, mod LAD calcification, mid LAD 30-40%, prox RCA 30-40%, EF 55-65%, 2+ MR  . Diabetes mellitus    Previously diet-controlled  . Endometriosis   . Family history of adverse reaction to anesthesia    mother used to have vomiting after   . Hypercholesteremia   . Hypertension   . Mitral valve prolapse    moderate by echo 10/2009 with mild MR;  Echocardiogram 07/23/11: Normal LV wall thickness, EF 60%, normal wall motion, moderately severe bileaflet mitral valve prolapse with mild-moderate MR.  This was not felt to be significantly different from prior echo;  [place] 12/12: RA 2, RV  19/2, PA 18/6 with a mean of 12, PCWP 4, LV 103/57 with a mean of 76, AO 103/10;  CO 3.7, CI 1.9, O2 - PA 67%, Ao 97%  . 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 UTI (urinary tract infection)    Past Surgical History:  Procedure Laterality Date  . ABDOMINAL HYSTERECTOMY     tah/bso  . APPENDECTOMY    . BUBBLE STUDY  08/09/2019   Procedure: BUBBLE STUDY;  Surgeon: Geralynn Rile, MD;  Location: [place];  Service: Cardiology;;  . CHOLECYSTECTOMY    . KNEE SURGERY    . LAPAROSCOPY    . MITRAL VALVE REPAIR Right 08/30/2019   Procedure: MINIMALLY INVASIVE MITRAL VALVE REPAIR (MVR) USING MEMO 4D SIZE 40;  Surgeon: Rexene Alberts, MD;  Location: [place];  Service: Open Heart Surgery;  Laterality: Right;  . RIGHT/LEFT HEART CATH AND CORONARY ANGIOGRAPHY N/A 08/09/2019   Procedure: RIGHT/LEFT HEART CATH AND CORONARY ANGIOGRAPHY;  Surgeon: Nelva Bush, MD;  Location: [facility];  Service: Cardiovascular;  Laterality: N/A;  . TEE WITHOUT CARDIOVERSION N/A 08/09/2019   Procedure: TRANSESOPHAGEAL ECHOCARDIOGRAM (TEE);  Surgeon: Geralynn Rile, MD;  Location: [place];  Service: Cardiology;  Laterality: N/A;  . TEE  WITHOUT CARDIOVERSION N/A 08/30/2019   Procedure: TRANSESOPHAGEAL ECHOCARDIOGRAM (TEE);  Surgeon: Purcell Nails, MD;  Location: [facility];  Service: Open Heart Surgery;  Laterality: N/A;  . US ECHOCARDIOGRAPHY     EF 55-60%    Current Outpatient Medications  Medication Sig Dispense Refill  . amiodarone (PACERONE) 200 MG tablet Take 1 tablet (200 mg total) by mouth daily. 30 tablet 1  . aspirin EC 81 MG tablet Take 1 tablet (81 mg total) by mouth daily.    . furosemide (LASIX) 40 MG tablet Take 1 tablet (40 mg total) by mouth daily. For one week then stop 7  tablet 0  . losartan (COZAAR) 25 MG tablet Take 25 mg by mouth daily.    . metFORMIN (GLUCOPHAGE) 500 MG tablet Take 1 tablet (500 mg total) by mouth 2 (two) times daily with a meal. 180 tablet 3  . metoprolol tartrate (LOPRESSOR) 25 MG tablet Take 0.5 tablets (12.5 mg total) by mouth 2 (two) times daily. 60 tablet 1  . oxyCODONE (OXY IR/ROXICODONE) 5 MG immediate release tablet Take 1 tablet (5 mg total) by mouth every 4 (four) hours as needed for severe pain. 30 tablet 0  . potassium chloride SA (KLOR-CON) 20 MEQ tablet Take 1 tablet (20 mEq total) by mouth daily. For one week then stop. 7 tablet 0  . rosuvastatin (CRESTOR) 20 MG tablet Take 1 tablet (20 mg total) by mouth at bedtime.    Marland Kitchen VICTOZA 18 MG/3ML SOPN Inject 0.3 mLs (1.8 mg total) into the skin daily. 5 pen 3  . warfarin (COUMADIN) 5 MG tablet Take 1 tablet (5 mg total) by mouth daily at 6 PM. Or as directed. 30 tablet 1  . zolpidem (AMBIEN CR) 6.25 MG CR tablet Take 1 tablet (6.25 mg total) by mouth at bedtime as needed for sleep. 30 tablet 2  . ferrous sulfate 325 (65 FE) MG tablet Take 1 tablet (325 mg total) by mouth daily with breakfast. For one month then stop;may stop sooner if develops constipation (Patient not taking: Reported on 09/11/2019) 30 tablet 0   No current facility-administered medications for this encounter.    Allergies  Allergen Reactions  . Penicillins Other (See Comments)    Convulsions  Did it involve swelling of the face/tongue/throat, SOB, or low BP? No Did it involve sudden or severe rash/hives, skin peeling, or any reaction on the inside of your mouth or nose? No Did you need to seek medical attention at a hospital or doctor's office? No When did it last happen?Childhood allergy If all above answers are "NO", may proceed with cephalosporin use.     Social History   Socioeconomic History  . Marital status: Divorced    Spouse name: Not on file  . Number of children: Not on file  . Years of  education: Not on file  . Highest education level: Not on file  Occupational History  . Not on file  Tobacco Use  . Smoking status: Former Smoker    Packs/day: 0.25    Years: 20.00    Pack years: 5.00    Types: Cigarettes    Quit date: 07/24/2015    Years since quitting: 4.1  . Smokeless tobacco: Never Used  Substance and Sexual Activity  . Alcohol use: Yes    Alcohol/week: 5.0 standard drinks    Types: 5 Standard drinks or equivalent per week  . Drug use: No  . Sexual activity: Not Currently    Birth control/protection: Surgical  Comment: Hysterectomy  Other Topics Concern  . Not on file  Social History Narrative  . Not on file   Social Determinants of Health   Financial Resource Strain:   . Difficulty of Paying Living Expenses: Not on file  Food Insecurity:   . Worried About Programme researcher, broadcasting/film/video in the Last Year: Not on file  . Ran Out of Food in the Last Year: Not on file  Transportation Needs:   . Lack of Transportation (Medical): Not on file  . Lack of Transportation (Non-Medical): Not on file  Physical Activity:   . Days of Exercise per Week: Not on file  . Minutes of Exercise per Session: Not on file  Stress:   . Feeling of Stress : Not on file  Social Connections:   . Frequency of Communication with Friends and Family: Not on file  . Frequency of Social Gatherings with Friends and Family: Not on file  . Attends Religious Services: Not on file  . Active Member of Clubs or Organizations: Not on file  . Attends Banker Meetings: Not on file  . Marital Status: Not on file  Intimate Partner Violence:   . Fear of Current or Ex-Partner: Not on file  . Emotionally Abused: Not on file  . Physically Abused: Not on file  . Sexually Abused: Not on file    Family History  Problem Relation Age of Onset  . Diabetes Mother   . Breast cancer Mother 41  . Hypertension Father   . Ovarian cancer Neg Hx   . Colon cancer Neg Hx   . Heart disease Neg Hx      ROS- All systems are reviewed and negative except as per the HPI above  Physical Exam: Vitals:   09/11/19 1101  BP: 130/64  Pulse: (!) 110  SpO2: 96%  Weight: 83 kg  Height: 5\' 7"  (1.702 m)   Wt Readings from Last 3 Encounters:  09/11/19 83 kg  09/05/19 88.5 kg  08/28/19 85.8 kg    Labs: Lab Results  Component Value Date   NA 139 09/04/2019   K 4.3 09/04/2019   CL 103 09/04/2019   CO2 27 09/04/2019   GLUCOSE 127 (H) 09/04/2019   BUN 8 09/04/2019   CREATININE 0.85 09/04/2019   CALCIUM 8.4 (L) 09/04/2019   MG 2.1 09/02/2019   Lab Results  Component Value Date   INR 1.3 (A) 09/07/2019   Lab Results  Component Value Date   CHOL 265 (H) 09/01/2018   HDL 54 09/01/2018   LDLCALC  09/01/2018     Comment:     . LDL cholesterol not calculated. Triglyceride levels greater than 400 mg/dL invalidate calculated LDL results. . Reference range: <100 . Desirable range <100 mg/dL for primary prevention;   <70 mg/dL for patients with CHD or diabetic patients  with > or = 2 CHD risk factors. 10/31/2018 LDL-C is now calculated using the Martin-Hopkins  calculation, which is a validated novel method providing  better accuracy than the Friedewald equation in the  estimation of LDL-C.  Marland Kitchen et al. Horald Pollen. Lenox Ahr): 2061-2068  (http://education.[facility].com/faq/FAQ164)    TRIG 664 (H) 09/01/2018     GEN- The patient is  tired  appearing, alert and oriented x 3 today.   Head- normocephalic, atraumatic Eyes-  Sclera clear, conjunctiva pink Ears- hearing intact Oropharynx- clear Neck- supple, no JVP Lymph- no cervical lymphadenopathy Lungs- Clear to ausculation bilaterally, normal work of breathing Heart-irregular rate and rhythm, no  murmurs, rubs or gallops, PMI not laterally displaced GI- soft, NT, ND, + BS Extremities- no clubbing, cyanosis, or edema MS- no significant deformity or atrophy Skin- no rash or lesion Psych- euthymic mood, full affect Neuro-  strength and sensation are intact  EKG-afib with RVR at 110 bpm, qrs int 100 ms, qtc 511 ms Epic records reviewed   Assessment and Plan: 1. Minimally invasive MV repair 08/30/19 Weighting daily,  pt states that it is coming down 1-2 lbs a day Continue  Furosemide  40 mg daily Walking in her hall several times a day for exercise  2.  New onset  afib  In afib with RVR today Discussed with Dr. Johney Frame and he suggested to increase amiodarone to 200 mg bid and metoprolol 25 mg bid   3. CHA2DS2VASc score of 5 On warfarin  INR still subtherapeutic today at 1.9 I will let the coumadin clinic know that the dose of amiodarone is increasing for their consideration in dosing coumadin     I will see back am of 1/25 for f/u EKG- rhythm/rate/qt  Bonnie Ryan. Matthew Folks [facility] [address] [place], [place] [address] [phone]

## 2019-09-12 ENCOUNTER — Ambulatory Visit: Payer: Managed Care, Other (non HMO) | Admitting: Nurse Practitioner

## 2019-09-16 ENCOUNTER — Other Ambulatory Visit: Payer: Self-pay | Admitting: Thoracic Surgery (Cardiothoracic Vascular Surgery)

## 2019-09-16 DIAGNOSIS — Z9889 Other specified postprocedural states: Secondary | ICD-10-CM

## 2019-09-17 ENCOUNTER — Other Ambulatory Visit: Payer: Self-pay

## 2019-09-17 ENCOUNTER — Ambulatory Visit (INDEPENDENT_AMBULATORY_CARE_PROVIDER_SITE_OTHER): Payer: Self-pay | Admitting: Physician Assistant

## 2019-09-17 ENCOUNTER — Ambulatory Visit (HOSPITAL_COMMUNITY)
Admission: RE | Admit: 2019-09-17 | Discharge: 2019-09-17 | Disposition: A | Discharge status: Home / Self Care | Payer: Managed Care, Other (non HMO) | Source: Ambulatory Visit | Attending: Nurse Practitioner | Admitting: Nurse Practitioner

## 2019-09-17 ENCOUNTER — Encounter (HOSPITAL_COMMUNITY): Payer: Self-pay | Admitting: Nurse Practitioner

## 2019-09-17 ENCOUNTER — Ambulatory Visit (INDEPENDENT_AMBULATORY_CARE_PROVIDER_SITE_OTHER): Payer: Managed Care, Other (non HMO) | Admitting: Pharmacist

## 2019-09-17 ENCOUNTER — Ambulatory Visit
Admission: RE | Admit: 2019-09-17 | Discharge: 2019-09-17 | Disposition: A | Discharge status: Home / Self Care | Payer: Managed Care, Other (non HMO) | Source: Ambulatory Visit | Attending: Thoracic Surgery (Cardiothoracic Vascular Surgery) | Admitting: Thoracic Surgery (Cardiothoracic Vascular Surgery)

## 2019-09-17 VITALS — BP 129/87 | HR 82 | Temp 97.5°F | Resp 16 | Ht 67.0 in | Wt 180.0 lb

## 2019-09-17 VITALS — BP 120/74 | HR 84

## 2019-09-17 DIAGNOSIS — Z9889 Other specified postprocedural states: Secondary | ICD-10-CM

## 2019-09-17 DIAGNOSIS — I4891 Unspecified atrial fibrillation: Secondary | ICD-10-CM | POA: Insufficient documentation

## 2019-09-17 DIAGNOSIS — Z79899 Other long term (current) drug therapy: Secondary | ICD-10-CM | POA: Diagnosis not present

## 2019-09-17 DIAGNOSIS — I340 Nonrheumatic mitral (valve) insufficiency: Secondary | ICD-10-CM

## 2019-09-17 DIAGNOSIS — Z5181 Encounter for therapeutic drug level monitoring: Secondary | ICD-10-CM

## 2019-09-17 DIAGNOSIS — I341 Nonrheumatic mitral (valve) prolapse: Secondary | ICD-10-CM

## 2019-09-17 LAB — POCT INR: INR: 4 — AB (ref 2.0–3.0)

## 2019-09-17 MED ORDER — AMIODARONE HCL 200 MG PO TABS: 200.0000 mg | ORAL_TABLET | Freq: Every day | ORAL | 1 refills | Status: DC

## 2019-09-17 NOTE — Patient Instructions (Signed)
Reduce Amiodarone to once daily Continue Metoprolol taking twice daily

## 2019-09-17 NOTE — Progress Notes (Signed)
In for ekg as when she was seen last week, she was in afib with rvr in the 110's. . I increased amiodarone to 200 mg bid and increased metoprolol to 25 mg bid after discussion with Dr.Allred.She  feels improved and walked into the visit today vrs w/c last week. She e is now in SR at 84 bpm, She  does have an tendency to run a long qtc and it is today at 536 ms, I will reduce amiodarone to 200 mg daily. I will leave the metoprolol at 25 mg bid. I will bring her back in one week for further evaluation. BP today at 120/74. Pt states that she has not been taking losartan since leaving hospital.  She  can discuss further with Dr. Cornelius Moras today.

## 2019-09-17 NOTE — Patient Instructions (Signed)
Description   Skip Coumadin today, then start taking 1 tablet daily except 1.5 tablets on Sundays. Recheck INR in 1 week. Continue Glucerna daily.

## 2019-09-17 NOTE — Patient Instructions (Signed)
May advance activity as tolerated. Echocardiogram to be scheduled by our office today. May drive when not requiring any pain medication.

## 2019-09-17 NOTE — Progress Notes (Signed)
HPI:  Patient returns for routine postoperative follow-up having undergone complex mini mitral valve repair By Dr. Cornelius Moras on 08/30/19 that included valvuloplasty with neocord placement x2, suture plication of the posterior leaflet, and placement of a Sorin memo 4D annuloplasty ring. The patient's early postoperative recovery while in the hospital was notable for atrial fibrillation that was managed with amiodarone.  This resulted in conversion back to sinus rhythm but also in a prolonged QT interval.  The amiodarone was discontinued but after consultation with cardiology it was restarted prior to discharge. Since hospital discharge,  the patient reports slow progress in terms of endurance.  She is not resting well due to discomfort at the incision site.  She does note that she is doing much better than she was 7 days ago after having walked in to the office with minimal shortness of breath or fatigue today and noting that she was unable to accomplish that last week.   She had recurrence of atrial fibrillation and was seen in the A. fib clinic about 1 week ago.  At that time, the amiodarone was increased to 200 mg p.o. twice daily.  Since then, she has converted back to sinus rhythm.  She was seen in the A. fib clinic earlier today and was advised to decrease the amiodarone back to 200 mg once daily.  She has also been followed closely in the Coumadin clinic and has an appointment later today for a PT/INR.  Her last INR was 1.9 one week ago.     Physical Exam   VS: BP  129/87 P  82 RR  16 T  97.5 SpO2  98%  Heart: Regular rate and rhythm, no murmur. Chest: Breath sounds are clear to auscultation, full and equal. Extremities: There is no peripheral edema Incisions: All are intact, dry, and healing with no evidence of complication.    Diagnostic Tests: CLINICAL DATA:  Status post mitral valve replacement  EXAM: CHEST - 2 VIEW  COMPARISON:  September 04, 2019 chest radiograph; chest CT  August 27, 2019  FINDINGS: Patient is status post mitral valve replacement. Heart size and pulmonary vascularity are within normal limits. There is persistent atelectasis in the posterior right mid lung with perihilar airspace opacity. There is also volume loss on the right with scarring along the right hemidiaphragm. The left lung is clear. No evident adenopathy. There is aortic atherosclerosis. No bone lesions. No evident pneumothorax.  IMPRESSION: Volume loss on the right with atelectatic change in the posterior right mid lung and right perihilar airspace opacity, stable. Left lung clear. No new opacity evident compared to most recent chest radiograph.  Stable cardiac silhouette. No adenopathy. Status post mitral valve replacement. No pneumothorax.   Electronically Signed   By: Bretta Bang III M.D.   On: 09/17/2019 13:02  Impression / Plan: Ms. Lope is making a progressive and satisfactory recovery 3 weeks status post complex mitral valve repair.  She had recurrence of her atrial fibrillation since discharge and was seen by the atrial fibrillation clinic last week.  Her amiodarone was increased to 200 mg twice daily at that time.  She has since converted back to sinus rhythm and after being seen in the A. fib clinic earlier today, her amiodarone dose has been decreased back to 200 mg p.o. daily.  She is otherwise making expected progress.  She may advance her activity as tolerated.  She may resume driving when she considers her pain to be controlled well enough as did not require  any medications.  Medications are reviewed and no changes appear necessary from our standpoint We will schedule an echocardiogram in the next 6 to 8 weeks. She will have her INR checked by the Coumadin clinic later this afternoon. Follow-up with Dr. Roxy Manns in 8 to 12 weeks.  Antony Odea, PA-C [facility] [phone]

## 2019-09-18 ENCOUNTER — Other Ambulatory Visit: Payer: Self-pay | Admitting: *Deleted

## 2019-09-18 DIAGNOSIS — Z952 Presence of prosthetic heart valve: Secondary | ICD-10-CM

## 2019-09-24 ENCOUNTER — Other Ambulatory Visit: Payer: Self-pay

## 2019-09-24 ENCOUNTER — Encounter (HOSPITAL_COMMUNITY): Payer: Self-pay | Admitting: Nurse Practitioner

## 2019-09-24 ENCOUNTER — Ambulatory Visit (HOSPITAL_COMMUNITY)
Admission: RE | Admit: 2019-09-24 | Discharge: 2019-09-24 | Disposition: A | Discharge status: Home / Self Care | Payer: Managed Care, Other (non HMO) | Source: Ambulatory Visit | Attending: Nurse Practitioner | Admitting: Nurse Practitioner

## 2019-09-24 VITALS — BP 124/82 | HR 90 | Ht 67.0 in | Wt 187.4 lb

## 2019-09-24 DIAGNOSIS — Z87891 Personal history of nicotine dependence: Secondary | ICD-10-CM | POA: Insufficient documentation

## 2019-09-24 DIAGNOSIS — E119 Type 2 diabetes mellitus without complications: Secondary | ICD-10-CM | POA: Insufficient documentation

## 2019-09-24 DIAGNOSIS — I2510 Atherosclerotic heart disease of native coronary artery without angina pectoris: Secondary | ICD-10-CM | POA: Insufficient documentation

## 2019-09-24 DIAGNOSIS — Z7901 Long term (current) use of anticoagulants: Secondary | ICD-10-CM | POA: Insufficient documentation

## 2019-09-24 DIAGNOSIS — Z79899 Other long term (current) drug therapy: Secondary | ICD-10-CM | POA: Insufficient documentation

## 2019-09-24 DIAGNOSIS — Z7982 Long term (current) use of aspirin: Secondary | ICD-10-CM | POA: Diagnosis not present

## 2019-09-24 DIAGNOSIS — Z880 Allergy status to penicillin: Secondary | ICD-10-CM | POA: Diagnosis not present

## 2019-09-24 DIAGNOSIS — Z8249 Family history of ischemic heart disease and other diseases of the circulatory system: Secondary | ICD-10-CM | POA: Diagnosis not present

## 2019-09-24 DIAGNOSIS — Z7984 Long term (current) use of oral hypoglycemic drugs: Secondary | ICD-10-CM | POA: Diagnosis not present

## 2019-09-24 DIAGNOSIS — D6869 Other thrombophilia: Secondary | ICD-10-CM | POA: Diagnosis not present

## 2019-09-24 DIAGNOSIS — I4891 Unspecified atrial fibrillation: Secondary | ICD-10-CM | POA: Insufficient documentation

## 2019-09-24 DIAGNOSIS — I10 Essential (primary) hypertension: Secondary | ICD-10-CM | POA: Insufficient documentation

## 2019-09-24 MED ORDER — METOPROLOL TARTRATE 25 MG PO TABS: 25.0000 mg | ORAL_TABLET | Freq: Two times a day (BID) | ORAL | 3 refills | Status: DC

## 2019-09-24 MED ORDER — AMIODARONE HCL 200 MG PO TABS: 200.0000 mg | ORAL_TABLET | Freq: Every day | ORAL | 3 refills | Status: DC

## 2019-09-24 NOTE — Progress Notes (Signed)
Primary Care Physician: Tracey Harries, FNP Referring Physician: Dr. Johney Frame  Cardiac Surgeon:   Dr. Pryor Ochoa Else is a 64 y.o. female with a h/o HTN, T2 DM, non obstructive CAD, MVP that had minimally invasive MV repari 08/30/19. Her post op course was complicated by new onset afib with RVR. SHe was started on IV amiodarone and converted to SR, but qt prolonged and drug was stopped. She  was d/c on 200 mg amiodarone po after consulting with Dr. Johney Frame  She was asked to f/u in the afib clinic and is in afib today with a v rate of 110 bpm. She  is very tired. She is walking in her hall at home for exercise but is worn out afterward. Her fluid status appears stable.  INR 1.3 on the 15th and 1.9 today. Continues on warfarin with a CHA2DS2VASc score of at least 5.   She was seen back for an EKG visit last week after her amiodarone was increased to 200  mg bid.She returned to SR and her dose of amiodarone was decreased to 200 mg daily.   She  is now in the clinic 09/24/19, continuing in SR with a shorter qtc interval  at 506 ms she is feeling stronger every week.  She is watching her weight and if she gains 2-3 lbs,then she will take a lasix. She  is dong this on an occasional basis.    Today, she denies symptoms of palpitations, chest pain, mild shortness of breath, orthopnea, PND, no lower extremity edema, dizziness, presyncope, syncope, or neurologic sequela   The patient is tolerating medications without difficulties and is otherwise without complaint today.   Past Medical History:  Diagnosis Date  . CAD (coronary artery disease)    Nonbstructive CAD by cath 09/2006 30% LAD, 25% RCA. Normal EF of 55-60% by echo 10/2009 with normal wall thickness.; ETT-Myoview 07/28/11: Normal without ischemia, EF 75%. ; LHC 12/12:   LM calcified, mod LAD calcification, mid LAD 30-40%, prox RCA 30-40%, EF 55-65%, 2+ MR  . Diabetes mellitus    Previously diet-controlled  . Endometriosis   . Family history of  adverse reaction to anesthesia    mother used to have vomiting after   . Hypercholesteremia   . Hypertension   . Mitral valve prolapse    moderate by echo 10/2009 with mild MR;  Echocardiogram 07/23/11: Normal LV wall thickness, EF 60%, normal wall motion, moderately severe bileaflet mitral valve prolapse with mild-moderate MR.  This was not felt to be significantly different from prior echo;  RHC 12/12: RA 2, RV 19/2, PA 18/6 with a mean of 12, PCWP 4, LV 103/57 with a mean of 76, AO 103/10;  CO 3.7, CI 1.9, O2 - PA 67%, Ao 97%  . 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 UTI (urinary tract infection)    Past Surgical History:  Procedure Laterality Date  . ABDOMINAL HYSTERECTOMY     tah/bso  . APPENDECTOMY    . BUBBLE STUDY  08/09/2019   Procedure: BUBBLE STUDY;  Surgeon: Sande Rives, MD;  Location: [facility] ENDOSCOPY;  Service: Cardiology;;  . CHOLECYSTECTOMY    . KNEE SURGERY    . LAPAROSCOPY    . MITRAL VALVE REPAIR Right 08/30/2019   Procedure: MINIMALLY INVASIVE MITRAL VALVE REPAIR (MVR) USING  MEMO 4D SIZE 40;  Surgeon: Purcell Nails, MD;  Location: [facility] OR;  Service: Open Heart Surgery;  Laterality: Right;  . RIGHT/LEFT HEART CATH AND CORONARY ANGIOGRAPHY N/A 08/09/2019   Procedure: RIGHT/LEFT HEART CATH AND CORONARY ANGIOGRAPHY;  Surgeon: Yvonne Kendall, MD;  Location: [facility] INVASIVE CV LAB;  Service: Cardiovascular;  Laterality: N/A;  . TEE WITHOUT CARDIOVERSION N/A 08/09/2019   Procedure: TRANSESOPHAGEAL ECHOCARDIOGRAM (TEE);  Surgeon: Sande Rives, MD;  Location: [facility] ENDOSCOPY;  Service: Cardiology;  Laterality: N/A;  . TEE WITHOUT CARDIOVERSION N/A 08/30/2019   Procedure: TRANSESOPHAGEAL ECHOCARDIOGRAM (TEE);  Surgeon: Purcell Nails, MD;  Location: [facility] OR;  Service:  Open Heart Surgery;  Laterality: N/A;  . US ECHOCARDIOGRAPHY     EF 55-60%    Current Outpatient Medications  Medication Sig Dispense Refill  . amiodarone (PACERONE) 200 MG tablet Take 1 tablet (200 mg total) by mouth daily. 30 tablet 3  . aspirin EC 81 MG tablet Take 1 tablet (81 mg total) by mouth daily.    . ferrous sulfate 325 (65 FE) MG tablet Take 1 tablet (325 mg total) by mouth daily with breakfast. For one month then stop;may stop sooner if develops constipation 30 tablet 0  . metFORMIN (GLUCOPHAGE) 500 MG tablet Take 1 tablet (500 mg total) by mouth 2 (two) times daily with a meal. 180 tablet 3  . metoprolol tartrate (LOPRESSOR) 25 MG tablet Take 1 tablet (25 mg total) by mouth 2 (two) times daily. 60 tablet 3  . rosuvastatin (CRESTOR) 20 MG tablet Take 1 tablet (20 mg total) by mouth at bedtime.    Marland Kitchen VICTOZA 18 MG/3ML SOPN Inject 0.3 mLs (1.8 mg total) into the skin daily. 5 pen 3  . warfarin (COUMADIN) 5 MG tablet Take 1 tablet (5 mg total) by mouth daily at 6 PM. Or as directed. 30 tablet 1  . zolpidem (AMBIEN CR) 6.25 MG CR tablet Take 1 tablet (6.25 mg total) by mouth at bedtime as needed for sleep. 30 tablet 2   No current facility-administered medications for this encounter.    Allergies  Allergen Reactions  . Penicillins Other (See Comments)    Convulsions  Did it involve swelling of the face/tongue/throat, SOB, or low BP? No Did it involve sudden or severe rash/hives, skin peeling, or any reaction on the inside of your mouth or nose? No Did you need to seek medical attention at a hospital or doctor's office? No When did it last happen?Childhood allergy If all above answers are "NO", may proceed with cephalosporin use.     Social History   Socioeconomic History  . Marital status: Divorced    Spouse name: Not on file  . Number of children: Not on file  . Years of education: Not on file  . Highest education level: Not on file  Occupational History  . Not  on file  Tobacco Use  . Smoking status: Former Smoker    Packs/day: 0.25    Years: 20.00    Pack years: 5.00    Types: Cigarettes    Quit date: 07/24/2015    Years since quitting: 4.1  . Smokeless tobacco: Never Used  Substance and Sexual Activity  . Alcohol use: Yes    Alcohol/week: 5.0 standard drinks    Types: 5 Standard drinks or equivalent per week  . Drug use: No  . Sexual activity: Not Currently    Birth control/protection: Surgical    Comment: Hysterectomy  Other Topics Concern  . Not  on file  Social History Narrative  . Not on file   Social Determinants of Health   Financial Resource Strain:   . Difficulty of Paying Living Expenses: Not on file  Food Insecurity:   . Worried About Programme researcher, broadcasting/film/video in the Last Year: Not on file  . Ran Out of Food in the Last Year: Not on file  Transportation Needs:   . Lack of Transportation (Medical): Not on file  . Lack of Transportation (Non-Medical): Not on file  Physical Activity:   . Days of Exercise per Week: Not on file  . Minutes of Exercise per Session: Not on file  Stress:   . Feeling of Stress : Not on file  Social Connections:   . Frequency of Communication with Friends and Family: Not on file  . Frequency of Social Gatherings with Friends and Family: Not on file  . Attends Religious Services: Not on file  . Active Member of Clubs or Organizations: Not on file  . Attends Banker Meetings: Not on file  . Marital Status: Not on file  Intimate Partner Violence:   . Fear of Current or Ex-Partner: Not on file  . Emotionally Abused: Not on file  . Physically Abused: Not on file  . Sexually Abused: Not on file    Family History  Problem Relation Age of Onset  . Diabetes Mother   . Breast cancer Mother 2  . Hypertension Father   . Ovarian cancer Neg Hx   . Colon cancer Neg Hx   . Heart disease Neg Hx     ROS- All systems are reviewed and negative except as per the HPI above  Physical  Exam: Vitals:   09/24/19 1110  BP: 124/82  Pulse: 90  Weight: 85 kg  Height: 5\' 7"  (1.702 m)   Wt Readings from Last 3 Encounters:  09/24/19 85 kg  09/17/19 81.6 kg  09/11/19 83 kg    Labs: Lab Results  Component Value Date   NA 139 09/04/2019   K 4.3 09/04/2019   CL 103 09/04/2019   CO2 27 09/04/2019   GLUCOSE 127 (H) 09/04/2019   BUN 8 09/04/2019   CREATININE 0.85 09/04/2019   CALCIUM 8.4 (L) 09/04/2019   MG 2.1 09/02/2019   Lab Results  Component Value Date   INR 4.0 (A) 09/17/2019   Lab Results  Component Value Date   CHOL 265 (H) 09/01/2018   HDL 54 09/01/2018   LDLCALC  09/01/2018     Comment:     . LDL cholesterol not calculated. Triglyceride levels greater than 400 mg/dL invalidate calculated LDL results. . Reference range: <100 . Desirable range <100 mg/dL for primary prevention;   <70 mg/dL for patients with CHD or diabetic patients  with > or = 2 CHD risk factors. 10/31/2018 LDL-C is now calculated using the Martin-Hopkins  calculation, which is a validated novel method providing  better accuracy than the Friedewald equation in the  estimation of LDL-C.  Marland Kitchen et al. Horald Pollen. Lenox Ahr): 2061-2068  (http://education.[employer].com/faq/FAQ164)    TRIG 664 (H) 09/01/2018     GEN- The patient is  tired  appearing, alert and oriented x 3 today.   Head- normocephalic, atraumatic Eyes-  Sclera clear, conjunctiva pink Ears- hearing intact Oropharynx- clear Neck- supple, no JVP Lymph- no cervical lymphadenopathy Lungs- Clear to ausculation bilaterally, normal work of breathing Heart- regular rate and rhythm, no murmurs, rubs or gallops, PMI not laterally displaced GI- soft, NT, ND, +  BS Extremities- no clubbing, cyanosis, or edema MS- no significant deformity or atrophy Skin- no rash or lesion Psych- euthymic mood, full affect Neuro- strength and sensation are intact  EKG- NSR at 90 bpm, PR int 198 ms, qrs int 88 ms, qtc 508 ms Epic  records reviewed   Assessment and Plan: 1. Minimally invasive MV repair 08/30/19  Recovering nicely from  surgery  Weighting daily  Takes furosemide 40 mg as needed for increase in weight  Walking in her hall several times a day for exercise  2.  New onset  afib   In SR   Continue with amiodarone 200 mg daily and metoprolol 25 mg bid   3. CHA2DS2VASc score of 5 On warfarin  INR pending 2/3  I will see back in 2 weeks for EKG/BP check   Butch Penny C. Eleftheria Taborn, [facility] [address] [place], [place] [address] [phone]

## 2019-09-26 ENCOUNTER — Ambulatory Visit (INDEPENDENT_AMBULATORY_CARE_PROVIDER_SITE_OTHER): Payer: Managed Care, Other (non HMO)

## 2019-09-26 ENCOUNTER — Other Ambulatory Visit: Payer: Self-pay

## 2019-09-26 DIAGNOSIS — Z9889 Other specified postprocedural states: Secondary | ICD-10-CM

## 2019-09-26 DIAGNOSIS — I4891 Unspecified atrial fibrillation: Secondary | ICD-10-CM

## 2019-09-26 DIAGNOSIS — Z5181 Encounter for therapeutic drug level monitoring: Secondary | ICD-10-CM

## 2019-09-26 LAB — POCT INR: INR: 2.1 (ref 2.0–3.0)

## 2019-09-26 NOTE — Patient Instructions (Signed)
-  Continue taking warfarin 1 tablet daily except 1.5 tablets on Sundays.  -Recheck INR in 1 week.  -Continue Glucerna daily.

## 2019-09-27 ENCOUNTER — Encounter: Payer: Self-pay | Admitting: Nurse Practitioner

## 2019-09-27 ENCOUNTER — Ambulatory Visit (INDEPENDENT_AMBULATORY_CARE_PROVIDER_SITE_OTHER): Payer: Managed Care, Other (non HMO) | Admitting: Nurse Practitioner

## 2019-09-27 VITALS — BP 110/76 | HR 81 | Ht 67.0 in | Wt 185.8 lb

## 2019-09-27 DIAGNOSIS — I4819 Other persistent atrial fibrillation: Secondary | ICD-10-CM | POA: Diagnosis not present

## 2019-09-27 DIAGNOSIS — R9431 Abnormal electrocardiogram [ECG] [EKG]: Secondary | ICD-10-CM | POA: Diagnosis not present

## 2019-09-27 DIAGNOSIS — Z9889 Other specified postprocedural states: Secondary | ICD-10-CM | POA: Diagnosis not present

## 2019-09-27 DIAGNOSIS — G4700 Insomnia, unspecified: Secondary | ICD-10-CM

## 2019-09-27 DIAGNOSIS — I10 Essential (primary) hypertension: Secondary | ICD-10-CM

## 2019-09-27 MED ORDER — ZOLPIDEM TARTRATE ER 6.25 MG PO TBCR: 6.2500 mg | EXTENDED_RELEASE_TABLET | Freq: Every evening | ORAL | 0 refills | Status: DC | PRN

## 2019-09-27 MED ORDER — AMIODARONE HCL 100 MG PO TABS: 100.0000 mg | ORAL_TABLET | Freq: Every day | ORAL | 3 refills | Status: DC

## 2019-09-27 NOTE — Progress Notes (Signed)
Office Visit    Patient Name: Bonnie Ryan Date of Encounter: 09/27/2019  Primary Care Provider:  Tracey Harries, FNP Primary Cardiologist:  Yvonne Kendall, MD  Chief Complaint    64 year old female with a history of mitral valve prolapse and mitral regurgitation status post minimally invasive mitral valve repair in January 2021, nonobstructive CAD, hypertension, hyperlipidemia, type 2 diabetes mellitus, and postoperative atrial fibrillation, who presents for follow-up related to the above.  Past Medical History    Past Medical History:  Diagnosis Date  . Diabetes mellitus    Previously diet-controlled  . Endometriosis   . Family history of adverse reaction to anesthesia    mother used to have vomiting after   . Hypercholesteremia   . Hypertension   . Mitral Valve Prolapse w/ Mitral regurgitation    a. 10/2009 Echo: Mod MVP, mild MR; b. 07/2019 Echo: EF 60-65%, Gr2 DD, Mod MR; c. 07/2019 TEE: EF 60-65%. No rwma. Mod dil LA. Sev MR w/ sev bileaflet prolapse. Mild TR; d. 08/2019 s/p minimally invasive MV repair.  . Non-obstructive CAD (coronary artery disease)    a. 09/2006 Cath: LAD 30, RCA 25; b.07/2011 Cath: LM Ca2+, 30-57mLAD, RCA 30-40p, EF 55-65%, 2+ MR; c. 07/2019 Cath: LM 32m/d, LAD 11m, RI nl, LCX 19m, RCA 45p, 71m.  Marland Kitchen Post-op atrial fibrillation (HCC)    a. 08/2019 following MV repair-->Amio/warfarin initiated.  Initially prolonged QT w/ IV amio-->converted to sinus.  . S/P minimally invasive mitral valve repair 08/30/2019   a. 08/2019 s/p Complex valvuloplasty including artificial Goretex neochord placement x12 with plication of posterior leaflet and 40 mm Sorin Memo 4D ring annuloplasty via right mini thoracotomy approach.  Marland Kitchen UTI (urinary tract infection)    Past Surgical History:  Procedure Laterality Date  . ABDOMINAL HYSTERECTOMY     tah/bso  . APPENDECTOMY    . BUBBLE STUDY  08/09/2019   Procedure: BUBBLE STUDY;  Surgeon: Sande Rives, MD;  Location:  [facility] ENDOSCOPY;  Service: Cardiology;;  . CHOLECYSTECTOMY    . KNEE SURGERY    . LAPAROSCOPY    . MITRAL VALVE REPAIR Right 08/30/2019   Procedure: MINIMALLY INVASIVE MITRAL VALVE REPAIR (MVR) USING MEMO 4D SIZE 40;  Surgeon: Purcell Nails, MD;  Location: [facility] OR;  Service: Open Heart Surgery;  Laterality: Right;  . RIGHT/LEFT HEART CATH AND CORONARY ANGIOGRAPHY N/A 08/09/2019   Procedure: RIGHT/LEFT HEART CATH AND CORONARY ANGIOGRAPHY;  Surgeon: Yvonne Kendall, MD;  Location: [facility] INVASIVE CV LAB;  Service: Cardiovascular;  Laterality: N/A;  . TEE WITHOUT CARDIOVERSION N/A 08/09/2019   Procedure: TRANSESOPHAGEAL ECHOCARDIOGRAM (TEE);  Surgeon: Sande Rives, MD;  Location: [facility] ENDOSCOPY;  Service: Cardiology;  Laterality: N/A;  . TEE WITHOUT CARDIOVERSION N/A 08/30/2019   Procedure: TRANSESOPHAGEAL ECHOCARDIOGRAM (TEE);  Surgeon: Purcell Nails, MD;  Location: [facility] OR;  Service: Open Heart Surgery;  Laterality: N/A;  . US ECHOCARDIOGRAPHY     EF 55-60%    Allergies  Allergies  Allergen Reactions  . Penicillins Other (See Comments)    Convulsions  Did it involve swelling of the face/tongue/throat, SOB, or low BP? No Did it involve sudden or severe rash/hives, skin peeling, or any reaction on the inside of your mouth or nose? No Did you need to seek medical attention at a hospital or doctor's office? No When did it last happen?Childhood allergy If all above answers are "NO", may proceed with cephalosporin use.     History of Present Illness  64 year old female with a history of mitral valve prolapse and mitral regurgitation status post minimally invasive mitral valve repair in January 2021, nonobstructive CAD, hypertension, hyperlipidemia, type 2 diabetes mellitus, and postoperative atrial fibrillation.  Mitral valve prolapse was diagnosed greater than 10 years ago with prior evaluation showing normal LV function, moderate mitral regurgitation, and nonobstructive CAD (2012).   She establish care with Dr. Okey Dupre in early 2020 at which point she reported a significant decline in functional capacity.  Follow-up echocardiography in November showed normal LV function with moderate to severe mitral regurgitation.  She subsequently underwent transesophageal echocardiogram which showed severe bileaflet mitral valve prolapse and severe mitral regurgitation.  In that setting, she underwent evaluation for mitral valve repair including diagnostic catheterization which showed nonobstructive CAD.  She was subsequently evaluated by CT surgery and underwent successful minimally invasive mitral valve repair on August 30, 2019.  Postoperative course was complicated by atrial fibrillation requiring intravenous amiodarone.  She converted to sinus rhythm and amiodarone was discontinued in the setting of prolonged QT.  After discussion with electrophysiology, she was placed on oral amiodarone therapy at 200 mg daily.  At A. fib clinic follow-up on January 19, she was in atrial fibrillation and felt fatigued.  Amiodarone dose was increased to 200 mg twice daily.  Follow-up ECG a week later showed sinus rhythm and amiodarone was decreased back to 200 mg daily as QTC was prolonged at 536 ms.  When she was last seen in A. fib clinic on February 1, she was maintaining sinus rhythm and feeling better somewhat better.  QTC had improved to 508 ms and she was maintained on amiodarone 200 mg daily.  Overall, since her discharge from the hospital, she says she has been disappointed.  She notes that she was a very active person a year ago and has just seen a significant decline in her activity tolerance and she now sees herself as being out of shape.  She was hopeful that she would resume 2019 activities immediately following discharge and now recognizes that that expectation was not reasonable.  Though she still notes some dyspnea on exertion with longer periods of activity, whereas she was not able to walk across the  room preoperatively, she can now do that easily without symptoms or limitations.  Preoperatively, she was on Lasix on a daily basis, and currently, she is only using Lasix on an as-needed basis maybe about once a week or less.  She has not any recurrent palpitations since her most recent A. fib clinic visit.  She denies chest pain, PND, orthopnea, dizziness, syncope, or early satiety.  She remains in sinus rhythm today with a QTC of 520 ms.  Home Medications    Prior to Admission medications   Medication Sig Start Date End Date Taking? Authorizing Provider  amiodarone (PACERONE) 200 MG tablet Take 1 tablet (200 mg total) by mouth daily. 09/24/19   Newman Nip, NP  aspirin EC 81 MG tablet Take 1 tablet (81 mg total) by mouth daily. 08/10/19   End, Cristal Deer, MD  ferrous sulfate 325 (65 FE) MG tablet Take 1 tablet (325 mg total) by mouth daily with breakfast. For one month then stop;may stop sooner if develops constipation 09/05/19 09/04/20  Ardelle Balls, PA-C  metFORMIN (GLUCOPHAGE) 500 MG tablet Take 1 tablet (500 mg total) by mouth 2 (two) times daily with a meal. 08/12/19   End, Cristal Deer, MD  metoprolol tartrate (LOPRESSOR) 25 MG tablet Take 1 tablet (25 mg total) by  mouth 2 (two) times daily. 09/24/19   Sherran Needs, NP  rosuvastatin (CRESTOR) 20 MG tablet Take 1 tablet (20 mg total) by mouth at bedtime. 09/05/19   Lars Pinks M, PA-C  VICTOZA 18 MG/3ML SOPN Inject 0.3 mLs (1.8 mg total) into the skin daily. 09/11/18   Jodelle Green, FNP  warfarin (COUMADIN) 5 MG tablet Take 1 tablet (5 mg total) by mouth daily at 6 PM. Or as directed. 09/05/19   Nani Skillern, PA-C  zolpidem (AMBIEN CR) 6.25 MG CR tablet Take 1 tablet (6.25 mg total) by mouth at bedtime as needed for sleep. 07/06/19   Guse, Jacquelynn Cree, FNP    Review of Systems    She still fatigues easily and has some dyspnea on exertion though this is improved compared to her preoperative state.  She is looking  forward to cardiac rehabilitation and resuming activities in a way that she was able to in 2019 before symptoms started worsening.  She denies chest pain, palpitations, PND, orthopnea, dizziness, syncope, or early satiety.  She occasionally notes mild lower extremity swelling and she will take a 20 mg Lasix if necessary.  She has been experiencing insomnia since discharge.  All other systems reviewed and are otherwise negative except as noted above.  Physical Exam    VS:  BP 110/76 (BP Location: Left Arm, Patient Position: Sitting, Cuff Size: Normal)   Pulse 81   Ht 5\' 7"  (1.702 m)   Wt 185 lb 12 oz (84.3 kg)   LMP  (LMP Unknown)   SpO2 99%   BMI 29.09 kg/m  , BMI Body mass index is 29.09 kg/m. GEN: Well nourished, well developed, in no acute distress. HEENT: normal. Neck: Supple, no JVD, carotid bruits, or masses. Cardiac: RRR, no murmurs, rubs, or gallops. No clubbing, cyanosis, edema.  Radials/PT 2+ and equal bilaterally.  Respiratory:  Respirations regular and unlabored, clear to auscultation bilaterally. GI: Soft, nontender, nondistended, BS + x 4. MS: no deformity or atrophy. Skin: warm and dry, no rash. Neuro:  Strength and sensation are intact. Psych: Normal affect.  Accessory Clinical Findings    ECG personally reviewed by me today -regular sinus rhythm, 81, inferior Q's, QTC 520 ms- no acute changes.  Lab Results  Component Value Date   WBC 6.2 09/04/2019   HGB 8.7 (L) 09/04/2019   HCT 26.6 (L) 09/04/2019   MCV 99.3 09/04/2019   PLT 150 09/04/2019   Lab Results  Component Value Date   CREATININE 0.85 09/04/2019   BUN 8 09/04/2019   NA 139 09/04/2019   K 4.3 09/04/2019   CL 103 09/04/2019   CO2 27 09/04/2019   Lab Results  Component Value Date   ALT 56 (H) 08/28/2019   AST 51 (H) 08/28/2019   ALKPHOS 70 08/28/2019   BILITOT 0.6 08/28/2019   Lab Results  Component Value Date   CHOL 265 (H) 09/01/2018   HDL 54 09/01/2018   Natural Steps  09/01/2018      Comment:     . LDL cholesterol not calculated. Triglyceride levels greater than 400 mg/dL invalidate calculated LDL results. . Reference range: <100 . Desirable range <100 mg/dL for primary prevention;   <70 mg/dL for patients with CHD or diabetic patients  with > or = 2 CHD risk factors. Marland Kitchen LDL-C is now calculated using the Martin-Hopkins  calculation, which is a validated novel method providing  better accuracy than the Friedewald equation in the  estimation of LDL-C.  Hassell Done  SS et al. JAMA. 3154;008(67): 2061-2068  (http://education.[facility].com/faq/FAQ164)    LDLDIRECT 240.5 09/08/2006   TRIG 664 (H) 09/01/2018   CHOLHDL 4.9 09/01/2018    Lab Results  Component Value Date   HGBA1C 7.0 (H) 08/28/2019    Assessment & Plan    1.  Severe mitral valve prolapse with severe mitral vegetation: Status post minimally invasive mitral valve repair on January 7.  Postoperative course complicated by postoperative atrial fibrillation requiring amiodarone initiation.  Dose has been adjusted in the outpatient setting in the setting of prolonged QT (see below) however, she has been maintaining sinus rhythm now since January 25.  She has slowly but surely increasing activity and has reset her expectations, as she thought perhaps she would be able to resume regular exercise as soon as her valve was repaired but has continued to have dyspnea on exertion which is steadily improving.  She is euvolemic on examination today.  She is scheduled for a follow-up echocardiogram in about 2 weeks to reestablish her baseline.  She is looking forward to participating in cardiac rehab.  2.  Persistent/postoperative atrial fibrillation/Prolonged QT: Now maintaining sinus rhythm on amiodarone therapy.  She is currently on 200 mg daily however, her QTC, which was 508 ms earlier this week, is now up again to 520 ms.  I discussed with Dr. Kirke Corin today, and will reduce her dose to 100 mg daily.  She has follow-up in  A. fib clinic on the 15th.  I will arrange for follow-up ECG on the 10th, when she is seen here for Coumadin clinic to reevaluate QTC.  Continue beta-blocker and warfarin therapy.  3.  Essential hypertension: Stable on beta-blocker.  4.  Hyperlipidemia: She remains on statin therapy.  LDL was not calculated her most recent lipid profile from last year secondary to elevated triglycerides.  Not clear if that was fasting.  This will need to be followed up in the future.  5.  Type 2 diabetes mellitus: A1c 7.0.  Insulin therapy per internal medicine.  6.  Postoperative anemia: Remains on iron.  Follow-up CBC today.  7.  Insomnia: Patient has been trouble sleeping postoperatively.  Initially, she was having some incisional pain though this seems to have improved.  She just has trouble falling asleep and staying asleep disposition: Follow-up CBC and basic metabolic panel today.  She is scheduled for follow-up echocardiogram on February 26.  I will arrange for follow-up ECG in the setting of prolonged QT on February 10, which presents for Coumadin visit.  She has follow-up with A. fib clinic in about 10 days.  Follow-up here in approximately 1 month or sooner if necessary.  Nicolasa Ducking, NP 09/27/2019, 6:53 PM

## 2019-09-27 NOTE — Patient Instructions (Addendum)
Medication Instructions:  1- DECREASE Amiodarone Take 1 tablet (100 mg total) by mouth daily. *If you need a refill on your cardiac medications before your next appointment, please call your pharmacy*  Lab Work: Your physician recommends that you have lab work today(CBC, BMET)   If you have labs (blood work) drawn today and your tests are completely normal, you will receive your results only by: Marland Kitchen MyChart Message (if you have MyChart) OR . A paper copy in the mail If you have any lab test that is abnormal or we need to change your treatment, we will call you to review the results.  Testing/Procedures: None ordered   Follow-Up: At [facility], you and your health needs are our priority.  As part of our continuing mission to provide you with exceptional heart care, we have created designated Provider Care Teams.  These Care Teams include your primary Cardiologist (physician) and Advanced Practice Providers (APPs -  Physician Assistants and Nurse Practitioners) who all work together to provide you with the care you need, when you need it.  Your next appointment:   1 month(s)  The format for your next appointment:   In Person  Provider:    You may see Yvonne Kendall, MD or Nicolasa Ducking, NP.

## 2019-09-28 LAB — CBC
Hematocrit: 36.7 % (ref 34.0–46.6)
Hemoglobin: 11.9 g/dL (ref 11.1–15.9)
MCH: 29.8 pg (ref 26.6–33.0)
MCHC: 32.4 g/dL (ref 31.5–35.7)
MCV: 92 fL (ref 79–97)
Platelets: 211 10*3/uL (ref 150–450)
RBC: 3.99 x10E6/uL (ref 3.77–5.28)
RDW: 13.7 % (ref 11.7–15.4)
WBC: 6.6 10*3/uL (ref 3.4–10.8)

## 2019-09-28 LAB — BASIC METABOLIC PANEL
BUN/Creatinine Ratio: 14 (ref 12–28)
BUN: 12 mg/dL (ref 8–27)
CO2: 23 mmol/L (ref 20–29)
Calcium: 9.1 mg/dL (ref 8.7–10.3)
Chloride: 99 mmol/L (ref 96–106)
Creatinine, Ser: 0.85 mg/dL (ref 0.57–1.00)
GFR calc Af Amer: 84 mL/min/{1.73_m2} (ref >59)
GFR calc non Af Amer: 73 mL/min/{1.73_m2} (ref >59)
Glucose: 331 mg/dL — ABNORMAL HIGH (ref 65–99)
Potassium: 3.7 mmol/L (ref 3.5–5.2)
Sodium: 139 mmol/L (ref 134–144)

## 2019-10-01 ENCOUNTER — Other Ambulatory Visit: Payer: Self-pay | Admitting: Internal Medicine

## 2019-10-01 ENCOUNTER — Telehealth: Payer: Self-pay | Admitting: Nurse Practitioner

## 2019-10-01 NOTE — Telephone Encounter (Signed)
Spoke w/ pt.  She was alarmed that she saw the 60 min EKG on her e-check in and had already made transportation arrangements for an INR check. Advised her that she comes in for INR check, I will take her to an exam room, perform EKG, have APP look at it, and then she can go. Advised her that she can have a visitor if she would like, she requests her sister come in w/ her so she does not have to sit in the cold. She is appreciative of the call.

## 2019-10-01 NOTE — Telephone Encounter (Signed)
Please call patient to discuss the reason for her needing an EKG at time of her coumadin check.

## 2019-10-03 ENCOUNTER — Ambulatory Visit (INDEPENDENT_AMBULATORY_CARE_PROVIDER_SITE_OTHER): Payer: Managed Care, Other (non HMO)

## 2019-10-03 ENCOUNTER — Other Ambulatory Visit: Payer: Self-pay

## 2019-10-03 ENCOUNTER — Ambulatory Visit (INDEPENDENT_AMBULATORY_CARE_PROVIDER_SITE_OTHER): Payer: Managed Care, Other (non HMO) | Admitting: *Deleted

## 2019-10-03 DIAGNOSIS — I4819 Other persistent atrial fibrillation: Secondary | ICD-10-CM | POA: Diagnosis not present

## 2019-10-03 DIAGNOSIS — I4891 Unspecified atrial fibrillation: Secondary | ICD-10-CM

## 2019-10-03 DIAGNOSIS — R9431 Abnormal electrocardiogram [ECG] [EKG]: Secondary | ICD-10-CM | POA: Diagnosis not present

## 2019-10-03 DIAGNOSIS — Z9889 Other specified postprocedural states: Secondary | ICD-10-CM

## 2019-10-03 DIAGNOSIS — Z5181 Encounter for therapeutic drug level monitoring: Secondary | ICD-10-CM

## 2019-10-03 LAB — POCT INR: INR: 1.6 — AB (ref 2.0–3.0)

## 2019-10-03 MED ORDER — POTASSIUM CHLORIDE CRYS ER 20 MEQ PO TBCR: 20.0000 meq | EXTENDED_RELEASE_TABLET | Freq: Every day | ORAL | 3 refills | Status: DC

## 2019-10-03 NOTE — Patient Instructions (Signed)
-   Take 1.5 tablets warfarin tonight & tomorrow - On Friday resume previous 1 tablet daily except 1.5 tablets on Sundays.  -Recheck INR in 1 week.

## 2019-10-03 NOTE — Patient Instructions (Signed)
Medication Instructions:  Your physician has recommended you make the following change in your medication:  1- START Potassium 20 mEq by mouth once a day.  *If you need a refill on your cardiac medications before your next appointment, please call your pharmacy*  Lab Work: none If you have labs (blood work) drawn today and your tests are completely normal, you will receive your results only by: Marland Kitchen MyChart Message (if you have MyChart) OR . A paper copy in the mail If you have any lab test that is abnormal or we need to change your treatment, we will call you to review the results.  Testing/Procedures: none  Follow-Up: At [facility], you and your health needs are our priority.  As part of our continuing mission to provide you with exceptional heart care, we have created designated Provider Care Teams.  These Care Teams include your primary Cardiologist (physician) and Advanced Practice Providers (APPs -  Physician Assistants and Nurse Practitioners) who all work together to provide you with the care you need, when you need it.  Your next appointment:   Keep follow up as scheduled.   The format for your next appointment:   In Person  Provider:   as scheduled.

## 2019-10-03 NOTE — Progress Notes (Signed)
1.) Reason for visit: EKG  2.) Name of MD requesting visit: Ward Givens, NP  3.) H&P: atrial fib  4.) ROS related to problem: Prolonged QT at last OV on 09/27/19. Repeat EKG today. No complaints. EKG performed.  5.) Assessment and plan per MD: Dr End reviewed EKG and chart. Advised to have patient start potassium 20 mEq by mouth daily and keep all scheduled follow up appointments.

## 2019-10-04 ENCOUNTER — Other Ambulatory Visit: Payer: Self-pay | Admitting: Family Medicine

## 2019-10-04 MED ORDER — COOL BLOOD GLUCOSE TEST STRIPS VI STRP: ORAL_STRIP | 0 refills | Status: DC

## 2019-10-04 MED ORDER — PEN NEEDLES 32G X 6 MM MISC: 1.0000 "application " | Freq: Every day | 0 refills | Status: DC

## 2019-10-04 NOTE — Telephone Encounter (Signed)
Pt needs Victoza pin needles Novofine plus pin mdl 32gx1/6in. She also needs One Touch Verio test strips. Pt has TOC appt schedule with Amedeo Kinsman on 10/22/19. She had surgery and she needs to be able to check her sugar.

## 2019-10-04 NOTE — Telephone Encounter (Signed)
OK to fill til appointment?

## 2019-10-08 ENCOUNTER — Ambulatory Visit (HOSPITAL_COMMUNITY)
Admission: RE | Admit: 2019-10-08 | Discharge: 2019-10-08 | Disposition: A | Discharge status: Home / Self Care | Payer: Managed Care, Other (non HMO) | Source: Ambulatory Visit | Attending: Nurse Practitioner | Admitting: Nurse Practitioner

## 2019-10-08 ENCOUNTER — Encounter (HOSPITAL_COMMUNITY): Payer: Self-pay | Admitting: Nurse Practitioner

## 2019-10-08 ENCOUNTER — Other Ambulatory Visit: Payer: Self-pay

## 2019-10-08 VITALS — BP 106/76 | HR 75 | Ht 67.0 in | Wt 185.0 lb

## 2019-10-08 DIAGNOSIS — Z8249 Family history of ischemic heart disease and other diseases of the circulatory system: Secondary | ICD-10-CM | POA: Diagnosis not present

## 2019-10-08 DIAGNOSIS — Z9889 Other specified postprocedural states: Secondary | ICD-10-CM | POA: Diagnosis not present

## 2019-10-08 DIAGNOSIS — Z880 Allergy status to penicillin: Secondary | ICD-10-CM | POA: Diagnosis not present

## 2019-10-08 DIAGNOSIS — Z79899 Other long term (current) drug therapy: Secondary | ICD-10-CM | POA: Insufficient documentation

## 2019-10-08 DIAGNOSIS — Z833 Family history of diabetes mellitus: Secondary | ICD-10-CM | POA: Insufficient documentation

## 2019-10-08 DIAGNOSIS — Z7901 Long term (current) use of anticoagulants: Secondary | ICD-10-CM | POA: Diagnosis not present

## 2019-10-08 DIAGNOSIS — Z87891 Personal history of nicotine dependence: Secondary | ICD-10-CM | POA: Insufficient documentation

## 2019-10-08 DIAGNOSIS — Z7982 Long term (current) use of aspirin: Secondary | ICD-10-CM | POA: Diagnosis not present

## 2019-10-08 DIAGNOSIS — I10 Essential (primary) hypertension: Secondary | ICD-10-CM | POA: Insufficient documentation

## 2019-10-08 DIAGNOSIS — E119 Type 2 diabetes mellitus without complications: Secondary | ICD-10-CM | POA: Insufficient documentation

## 2019-10-08 DIAGNOSIS — I4891 Unspecified atrial fibrillation: Secondary | ICD-10-CM | POA: Diagnosis present

## 2019-10-08 DIAGNOSIS — I2510 Atherosclerotic heart disease of native coronary artery without angina pectoris: Secondary | ICD-10-CM | POA: Diagnosis not present

## 2019-10-08 DIAGNOSIS — D6869 Other thrombophilia: Secondary | ICD-10-CM

## 2019-10-08 DIAGNOSIS — Z794 Long term (current) use of insulin: Secondary | ICD-10-CM | POA: Diagnosis not present

## 2019-10-08 MED ORDER — AMIODARONE HCL 100 MG PO TABS: 100.0000 mg | ORAL_TABLET | ORAL | 3 refills | Status: DC

## 2019-10-08 NOTE — Progress Notes (Signed)
Primary Care Physician: Tracey Harries, FNP Referring Physician: Dr. Johney Frame  Cardiac Surgeon:   Dr. Pryor Ochoa Venning is a 64 y.o. female with a h/o HTN, T2 DM, non obstructive CAD, MVP that had minimally invasive MV repari 08/30/19. Her post op course was complicated by new onset afib with RVR. SHe was started on IV amiodarone and converted to SR, but qt prolonged and drug was stopped. She  was d/c on 200 mg amiodarone po after consulting with Dr. Johney Frame  She was asked to f/u in the afib clinic and is in afib today with a v rate of 110 bpm. She  is very tired. She is walking in her hall at home for exercise but is worn out afterward. Her fluid status appears stable.  INR 1.3 on the 15th and 1.9 today. Continues on warfarin with a CHA2DS2VASc score of at least 5.   She was seen back for an EKG visit last week after her amiodarone was increased to 200  mg bid.She returned to SR and her dose of amiodarone was decreased to 200 mg daily.   She  is now in the clinic 09/24/19, continuing in SR with a shorter qtc interval  at 506 ms she is feeling stronger every week.  She is watching her weight and if she gains 2-3 lbs,then she will take a lasix. She  is dong this on an occasional basis.    F/u in afib clinic, 10/08/19. She was seen by Ward Givens 09/27/19 and her amiodarone was decreased to 100 mg daily for continued prolonged qt. Qtc remains at 502 ms today in SR. She is frustrated as she wants to get back to going to the gym but feels she has more shortness of breath than she should at this point in recovery. She does watch her weight and takes a lasix about every 2-3 weeks. No PND/orthopnea, exertional shortness of breath and the feeling like she needs to catch her breath at rest. Her weight is actually down a few lbs from last visit here. Has not noted any irregular heart beat.   Today, she denies symptoms of palpitations, chest pain, mild shortness of breath, orthopnea, PND, no lower extremity  edema, dizziness, presyncope, syncope, or neurologic sequela   The patient is tolerating medications without difficulties and is otherwise without complaint today.   Past Medical History:  Diagnosis Date  . Diabetes mellitus    Previously diet-controlled  . Endometriosis   . Family history of adverse reaction to anesthesia    mother used to have vomiting after   . Hypercholesteremia   . Hypertension   . Mitral Valve Prolapse w/ Mitral regurgitation    a. 10/2009 Echo: Mod MVP, mild MR; b. 07/2019 Echo: EF 60-65%, Gr2 DD, Mod MR; c. 07/2019 TEE: EF 60-65%. No rwma. Mod dil LA. Sev MR w/ sev bileaflet prolapse. Mild TR; d. 08/2019 s/p minimally invasive MV repair.  . Non-obstructive CAD (coronary artery disease)    a. 09/2006 Cath: LAD 30, RCA 25; b.07/2011 Cath: LM Ca2+, 30-46mLAD, RCA 30-40p, EF 55-65%, 2+ MR; c. 07/2019 Cath: LM 67m/d, LAD 63m, RI nl, LCX 59m, RCA 45p, 30m.  Marland Kitchen Post-op atrial fibrillation (HCC)    a. 08/2019 following MV repair-->Amio/warfarin initiated.  Initially prolonged QT w/ IV amio-->converted to sinus.  . S/P minimally invasive mitral valve repair 08/30/2019   a. 08/2019 s/p Complex valvuloplasty including artificial Goretex neochord placement x12 with plication of posterior leaflet and 40 mm Sorin  Memo 4D ring annuloplasty via right mini thoracotomy approach.  Marland Kitchen UTI (urinary tract infection)    Past Surgical History:  Procedure Laterality Date  . ABDOMINAL HYSTERECTOMY     tah/bso  . APPENDECTOMY    . BUBBLE STUDY  08/09/2019   Procedure: BUBBLE STUDY;  Surgeon: Sande Rives, MD;  Location: [facility] ENDOSCOPY;  Service: Cardiology;;  . CHOLECYSTECTOMY    . KNEE SURGERY    . LAPAROSCOPY    . MITRAL VALVE REPAIR Right 08/30/2019   Procedure: MINIMALLY INVASIVE MITRAL VALVE REPAIR (MVR) USING MEMO 4D SIZE 40;  Surgeon: Purcell Nails, MD;  Location: [facility] OR;  Service: Open Heart Surgery;  Laterality: Right;  . RIGHT/LEFT HEART CATH AND CORONARY ANGIOGRAPHY N/A  08/09/2019   Procedure: RIGHT/LEFT HEART CATH AND CORONARY ANGIOGRAPHY;  Surgeon: Yvonne Kendall, MD;  Location: [facility] INVASIVE CV LAB;  Service: Cardiovascular;  Laterality: N/A;  . TEE WITHOUT CARDIOVERSION N/A 08/09/2019   Procedure: TRANSESOPHAGEAL ECHOCARDIOGRAM (TEE);  Surgeon: Sande Rives, MD;  Location: [facility] ENDOSCOPY;  Service: Cardiology;  Laterality: N/A;  . TEE WITHOUT CARDIOVERSION N/A 08/30/2019   Procedure: TRANSESOPHAGEAL ECHOCARDIOGRAM (TEE);  Surgeon: Purcell Nails, MD;  Location: [facility] OR;  Service: Open Heart Surgery;  Laterality: N/A;  . US ECHOCARDIOGRAPHY     EF 55-60%    Current Outpatient Medications  Medication Sig Dispense Refill  . amiodarone (PACERONE) 100 MG tablet Take 1 tablet (100 mg total) by mouth every other day. 90 tablet 3  . aspirin EC 81 MG tablet Take 1 tablet (81 mg total) by mouth daily.    . furosemide (LASIX) 20 MG tablet Taking one tablet by mouth every other day    . glucose blood (COOL BLOOD GLUCOSE TEST STRIPS) test strip Use as instructed Use to test blood glucose daily. DX E11.9 100 each 0  . Insulin Pen Needle (PEN NEEDLES) 32G X 6 MM MISC 1 application by Does not apply route daily. DX E11.9 100 each 0  . metFORMIN (GLUCOPHAGE) 500 MG tablet Take 1 tablet (500 mg total) by mouth 2 (two) times daily with a meal. 180 tablet 3  . metoprolol tartrate (LOPRESSOR) 25 MG tablet Take 1 tablet (25 mg total) by mouth 2 (two) times daily. 60 tablet 3  . potassium chloride SA (KLOR-CON) 20 MEQ tablet Take 1 tablet (20 mEq total) by mouth daily. 30 tablet 3  . rosuvastatin (CRESTOR) 20 MG tablet Take 1 tablet (20 mg total) by mouth at bedtime.    Marland Kitchen VICTOZA 18 MG/3ML SOPN Inject 0.3 mLs (1.8 mg total) into the skin daily. 5 pen 3  . warfarin (COUMADIN) 5 MG tablet Take 1 tablet (5 mg total) by mouth daily at 6 PM. Or as directed. 30 tablet 1  . zolpidem (AMBIEN CR) 6.25 MG CR tablet Take 1 tablet (6.25 mg total) by mouth at bedtime as needed for  sleep. 30 tablet 0  . ferrous sulfate 325 (65 FE) MG tablet Take 1 tablet (325 mg total) by mouth daily with breakfast. For one month then stop;may stop sooner if develops constipation (Patient not taking: Reported on 10/08/2019) 30 tablet 0   No current facility-administered medications for this encounter.    Allergies  Allergen Reactions  . Penicillins Other (See Comments)    Convulsions  Did it involve swelling of the face/tongue/throat, SOB, or low BP? No Did it involve sudden or severe rash/hives, skin peeling, or any reaction on the inside of your mouth or nose? No  Did you need to seek medical attention at a hospital or doctor's office? No When did it last happen?Childhood allergy If all above answers are "NO", may proceed with cephalosporin use.     Social History   Socioeconomic History  . Marital status: Divorced    Spouse name: Not on file  . Number of children: Not on file  . Years of education: Not on file  . Highest education level: Not on file  Occupational History  . Not on file  Tobacco Use  . Smoking status: Former Smoker    Packs/day: 0.25    Years: 20.00    Pack years: 5.00    Types: Cigarettes    Quit date: 07/24/2015    Years since quitting: 4.2  . Smokeless tobacco: Never Used  Substance and Sexual Activity  . Alcohol use: Yes    Alcohol/week: 5.0 standard drinks    Types: 5 Standard drinks or equivalent per week  . Drug use: No  . Sexual activity: Not Currently    Birth control/protection: Surgical    Comment: Hysterectomy  Other Topics Concern  . Not on file  Social History Narrative  . Not on file   Social Determinants of Health   Financial Resource Strain:   . Difficulty of Paying Living Expenses: Not on file  Food Insecurity:   . Worried About Programme researcher, broadcasting/film/video in the Last Year: Not on file  . Ran Out of Food in the Last Year: Not on file  Transportation Needs:   . Lack of Transportation (Medical): Not on file  . Lack of  Transportation (Non-Medical): Not on file  Physical Activity:   . Days of Exercise per Week: Not on file  . Minutes of Exercise per Session: Not on file  Stress:   . Feeling of Stress : Not on file  Social Connections:   . Frequency of Communication with Friends and Family: Not on file  . Frequency of Social Gatherings with Friends and Family: Not on file  . Attends Religious Services: Not on file  . Active Member of Clubs or Organizations: Not on file  . Attends Banker Meetings: Not on file  . Marital Status: Not on file  Intimate Partner Violence:   . Fear of Current or Ex-Partner: Not on file  . Emotionally Abused: Not on file  . Physically Abused: Not on file  . Sexually Abused: Not on file    Family History  Problem Relation Age of Onset  . Diabetes Mother   . Breast cancer Mother 95  . Hypertension Father   . Ovarian cancer Neg Hx   . Colon cancer Neg Hx   . Heart disease Neg Hx     ROS- All systems are reviewed and negative except as per the HPI above  Physical Exam: Vitals:   10/08/19 1031  BP: 106/76  Pulse: 75  Weight: 83.9 kg  Height: 5\' 7"  (1.702 m)   Wt Readings from Last 3 Encounters:  10/08/19 83.9 kg  10/03/19 84.3 kg  09/27/19 84.3 kg    Labs: Lab Results  Component Value Date   NA 139 09/27/2019   K 3.7 09/27/2019   CL 99 09/27/2019   CO2 23 09/27/2019   GLUCOSE 331 (H) 09/27/2019   BUN 12 09/27/2019   CREATININE 0.85 09/27/2019   CALCIUM 9.1 09/27/2019   MG 2.1 09/02/2019   Lab Results  Component Value Date   INR 1.6 (A) 10/03/2019   Lab Results  Component Value Date   CHOL 265 (H) 09/01/2018   HDL 54 09/01/2018   [place]  09/01/2018     Comment:     . LDL cholesterol not calculated. Triglyceride levels greater than 400 mg/dL invalidate calculated LDL results. . Reference range: <100 . Desirable range <100 mg/dL for primary prevention;   <70 mg/dL for patients with CHD or diabetic patients  with > or = 2 CHD  risk factors. Marland Kitchen LDL-C is now calculated using the Martin-Hopkins  calculation, which is a validated novel method providing  better accuracy than the Friedewald equation in the  estimation of LDL-C.  Cresenciano Genre et al. Annamaria Helling. 7017;793(90): 2061-2068  (http://education.[employer].com/faq/FAQ164)    TRIG 664 (H) 09/01/2018     GEN- The patient is  tired  appearing, alert and oriented x 3 today.   Head- normocephalic, atraumatic Eyes-  Sclera clear, conjunctiva pink Ears- hearing intact Oropharynx- clear Neck- supple, no JVP Lymph- no cervical lymphadenopathy Lungs- Clear to ausculation bilaterally, normal work of breathing Heart- regular rate and rhythm, no murmurs, rubs or gallops, PMI not laterally displaced GI- soft, NT, ND, + BS Extremities- no clubbing, cyanosis, or edema MS- no significant deformity or atrophy Skin- no rash or lesion Psych- euthymic mood, full affect Neuro- strength and sensation are intact  EKG- NSR at 90 bpm, PR int 198 ms, qrs int 88 ms, qtc 508 ms Epic records reviewed   Assessment and Plan: 1. Minimally invasive MV repair 08/30/19  Recovering nicely from  surgery but is anxious to be back at baseline so she can exercise, feels she should be noticing less shortness of breath at this stage post op  Weighing  daily, weight stable  Takes furosemide 40 mg as needed for increase in weight    2.  New onset  afib  Appearing to be staying in [place] has been a concern with amiodarone, appears stable but still over 500 ms Will go ahead and decrease amiodarone to 100 mg (decreased 2/4) to qod   There is a chance that amiodarone may be contributing to some shortness of breath  When she sees Ignacia Bayley, 3/4, it can be decided if amiodarone can be stopped at that point   Continue with metoprolol 25 mg bid   3. CHA2DS2VASc score of 5 On warfarin  Last INR 1.6 on 10/03/19 Will message [facility]   that amiodarone is being decreased  again so proper adjustments  can be made  when it is checked again 2/17  Afib clinic as needed  Ignacia Bayley, NP 10/25/19 Dr. Roxy Manns 11/26/19  Geroge Baseman. Reem Fleury, [facility] [address] [place], [place] [address] [phone]

## 2019-10-10 ENCOUNTER — Ambulatory Visit (INDEPENDENT_AMBULATORY_CARE_PROVIDER_SITE_OTHER): Payer: Managed Care, Other (non HMO)

## 2019-10-10 ENCOUNTER — Other Ambulatory Visit: Payer: Self-pay

## 2019-10-10 DIAGNOSIS — I4891 Unspecified atrial fibrillation: Secondary | ICD-10-CM

## 2019-10-10 DIAGNOSIS — Z5181 Encounter for therapeutic drug level monitoring: Secondary | ICD-10-CM

## 2019-10-10 DIAGNOSIS — Z9889 Other specified postprocedural states: Secondary | ICD-10-CM

## 2019-10-10 LAB — POCT INR: INR: 2.1 (ref 2.0–3.0)

## 2019-10-10 NOTE — Patient Instructions (Signed)
-   START NEW warfarin dosage 1 tablet daily except 1.5 tablets on Sundays.  - recheck INR in 2 weeks.

## 2019-10-19 ENCOUNTER — Other Ambulatory Visit: Payer: Self-pay

## 2019-10-19 ENCOUNTER — Ambulatory Visit (INDEPENDENT_AMBULATORY_CARE_PROVIDER_SITE_OTHER): Payer: Managed Care, Other (non HMO)

## 2019-10-19 ENCOUNTER — Ambulatory Visit (INDEPENDENT_AMBULATORY_CARE_PROVIDER_SITE_OTHER): Payer: Managed Care, Other (non HMO) | Admitting: Family

## 2019-10-19 ENCOUNTER — Encounter: Payer: Self-pay | Admitting: Family

## 2019-10-19 VITALS — BP 104/68 | HR 95 | Ht 67.0 in | Wt 177.0 lb

## 2019-10-19 DIAGNOSIS — I10 Essential (primary) hypertension: Secondary | ICD-10-CM

## 2019-10-19 DIAGNOSIS — Z9889 Other specified postprocedural states: Secondary | ICD-10-CM | POA: Diagnosis not present

## 2019-10-19 DIAGNOSIS — I4891 Unspecified atrial fibrillation: Secondary | ICD-10-CM | POA: Diagnosis not present

## 2019-10-19 DIAGNOSIS — R9431 Abnormal electrocardiogram [ECG] [EKG]: Secondary | ICD-10-CM

## 2019-10-19 DIAGNOSIS — Z952 Presence of prosthetic heart valve: Secondary | ICD-10-CM | POA: Diagnosis not present

## 2019-10-19 DIAGNOSIS — R42 Dizziness and giddiness: Secondary | ICD-10-CM

## 2019-10-19 DIAGNOSIS — R0600 Dyspnea, unspecified: Secondary | ICD-10-CM

## 2019-10-19 DIAGNOSIS — R0609 Other forms of dyspnea: Secondary | ICD-10-CM

## 2019-10-19 MED ORDER — PERFLUTREN LIPID MICROSPHERE
1.0000 mL | INTRAVENOUS | Status: AC | PRN
  Administered 2019-10-19: 2 mL via INTRAVENOUS

## 2019-10-19 NOTE — Patient Instructions (Addendum)
Medication Instructions:  1- DECREASE Losartan 0.5 tablet (25 mg total) once daily  *If you need a refill on your cardiac medications before your next appointment, please call your pharmacy*   Lab Work: Your physician recommends that you have lab work today(CMET, CBC, TSH)  If you have labs (blood work) drawn today and your tests are completely normal, you will receive your results only by: Marland Kitchen MyChart Message (if you have MyChart) OR . A paper copy in the mail If you have any lab test that is abnormal or we need to change your treatment, we will call you to review the results.   Testing/Procedures: None ordered    Follow-Up: At [facility], you and your health needs are our priority.  As part of our continuing mission to provide you with exceptional heart care, we have created designated Provider Care Teams.  These Care Teams include your primary Cardiologist (physician) and Advanced Practice Providers (APPs -  Physician Assistants and Nurse Practitioners) who all work together to provide you with the care you need, when you need it.  We recommend signing up for the patient portal called "MyChart".  Sign up information is provided on this After Visit Summary.  MyChart is used to connect with patients for Virtual Visits (Telemedicine).  Patients are able to view lab/test results, encounter notes, upcoming appointments, etc.  Non-urgent messages can be sent to your provider as well.   To learn more about what you can do with MyChart, go to [email or web address].    Your next appointment:  See appt page

## 2019-10-19 NOTE — Progress Notes (Signed)
Office Visit    Patient Name: Bonnie Ryan Date of Encounter: 10/19/2019  Primary Care Provider:  Tracey Harries, FNP Primary Cardiologist:  Yvonne Kendall, MD Electrophysiologist:  None   Chief Complaint    Bonnie Ryan is a 64 y.o. female with a hx of MVP s/p minimally invasive MV repair January 2021 with post op atrial fibrillation, HTN, DM2, nonobstructive CAD presents today for DOE, dizziness.   Past Medical History    Past Medical History:  Diagnosis Date  . Diabetes mellitus    Previously diet-controlled  . Endometriosis   . Family history of adverse reaction to anesthesia    mother used to have vomiting after   . Hypercholesteremia   . Hypertension   . Mitral Valve Prolapse w/ Mitral regurgitation    a. 10/2009 Echo: Mod MVP, mild MR; b. 07/2019 Echo: EF 60-65%, Gr2 DD, Mod MR; c. 07/2019 TEE: EF 60-65%. No rwma. Mod dil LA. Sev MR w/ sev bileaflet prolapse. Mild TR; d. 08/2019 s/p minimally invasive MV repair.  . Non-obstructive CAD (coronary artery disease)    a. 09/2006 Cath: LAD 30, RCA 25; b.07/2011 Cath: LM Ca2+, 30-56mLAD, RCA 30-40p, EF 55-65%, 2+ MR; c. 07/2019 Cath: LM 43m/d, LAD 62m, RI nl, LCX 73m, RCA 45p, 61m.  Marland Kitchen Post-op atrial fibrillation (HCC)    a. 08/2019 following MV repair-->Amio/warfarin initiated.  Initially prolonged QT w/ IV amio-->converted to sinus.  . S/P minimally invasive mitral valve repair 08/30/2019   a. 08/2019 s/p Complex valvuloplasty including artificial Goretex neochord placement x12 with plication of posterior leaflet and 40 mm Sorin Memo 4D ring annuloplasty via right mini thoracotomy approach.  Marland Kitchen UTI (urinary tract infection)    Past Surgical History:  Procedure Laterality Date  . ABDOMINAL HYSTERECTOMY     tah/bso  . APPENDECTOMY    . BUBBLE STUDY  08/09/2019   Procedure: BUBBLE STUDY;  Surgeon: Sande Rives, MD;  Location: [facility] ENDOSCOPY;  Service: Cardiology;;  . CHOLECYSTECTOMY    . KNEE SURGERY    .  LAPAROSCOPY    . MITRAL VALVE REPAIR Right 08/30/2019   Procedure: MINIMALLY INVASIVE MITRAL VALVE REPAIR (MVR) USING MEMO 4D SIZE 40;  Surgeon: Purcell Nails, MD;  Location: [facility] OR;  Service: Open Heart Surgery;  Laterality: Right;  . RIGHT/LEFT HEART CATH AND CORONARY ANGIOGRAPHY N/A 08/09/2019   Procedure: RIGHT/LEFT HEART CATH AND CORONARY ANGIOGRAPHY;  Surgeon: Yvonne Kendall, MD;  Location: [facility] INVASIVE CV LAB;  Service: Cardiovascular;  Laterality: N/A;  . TEE WITHOUT CARDIOVERSION N/A 08/09/2019   Procedure: TRANSESOPHAGEAL ECHOCARDIOGRAM (TEE);  Surgeon: Sande Rives, MD;  Location: [facility] ENDOSCOPY;  Service: Cardiology;  Laterality: N/A;  . TEE WITHOUT CARDIOVERSION N/A 08/30/2019   Procedure: TRANSESOPHAGEAL ECHOCARDIOGRAM (TEE);  Surgeon: Purcell Nails, MD;  Location: [facility] OR;  Service: Open Heart Surgery;  Laterality: N/A;  . US ECHOCARDIOGRAPHY     EF 55-60%    Allergies  Allergies  Allergen Reactions  . Penicillins Other (See Comments)    Convulsions  Did it involve swelling of the face/tongue/throat, SOB, or low BP? No Did it involve sudden or severe rash/hives, skin peeling, or any reaction on the inside of your mouth or nose? No Did you need to seek medical attention at a hospital or doctor's office? No When did it last happen?Childhood allergy If all above answers are "NO", may proceed with cephalosporin use.     History of Present Illness    Bonnie Ryan is a  64 y.o. female with a hx of MVP and MR s/p minimally invasive MV repair January 2021 with post op atrial fibrillation, HTN, DM2, nonobstructive CAD. She was last seen in the atrial fibrillation clinic on 10/08/2019.  Mitral valve prolapse diagnosed >10 years ago. Prior evaluation with normal LVef, moderate MR, and nonobstructive CAD by cath 2012. Established with Dr. Liliane Channel 2020 noting decline in functional capacity. Echocardiogram 06/2019 with normal LVEF and moderate to severe MR. TEE with  severe bileaflet MVP and severe MR. Diagnostic catheterization with nonobstructive CAD. Referred to CT surgery and underwent successful minimally invasive mitral valve repair on August 30, 2019. Postoperatively she had atrial fibrillation treated with IV amiodarone. Converted to SR and amiodarone was discontinued due to prolonged QT. After discussion with EP, started Amiodarone 200mg  daily. Follow up with A. Fib clinic 09/11/19 she was in atrial fib with fatigue. Amiodarone increased to 200mg  BID and then reduced to daily one week later as she was in SR with QTc 536. On 09/27/19 her Amiodarone was reduced to 100mg  daily due to QTc 520.   Presented to office today for her repeat echocardiogram after MV repair in January. She was symptomatic during exam and seen for office visit. Her mother was present for the visit.   Noted SOB and DOE. Appears to be worsening compared to previous. Occurs primarily with activity but sometimes with speaking. Had to take several deep breaths during our conversation.   Notes lightheadedness and dizziness. Particularly noticeable with position changes. Her orthostatic vital signs were negative today. Checks BP intermittently at home and readings typical 100-110 systolic. Tells me her BP has always been low normal and this typically does not cause her to be lightheadedness.   Taking Lasix approximately every other day. Monitors her weight carefully at home and reports it has been overall stable and she will lose 1lb every few days.   One short episode of palpitations while resting a few nights ago which self resolved.   No chest pain, pressure, tightness. No LE edema, orthopnea, PND. Reports no bleeding complications on Warfarin.   EKGs/Labs/Other Studies Reviewed:   The following studies were reviewed today:  EKG:  EKG is ordered today.  The ekg ordered today demonstrates SR 95 bpm with 1st degree AV block, inferior Q's and no acute ST/T wave changes. QTc 485.   Recent  Labs: 08/28/2019: ALT 56 09/02/2019: Magnesium 2.1 09/03/2019: TSH 3.759 09/27/2019: BUN 12; Creatinine, Ser 0.85; Hemoglobin 11.9; Platelets 211; Potassium 3.7; Sodium 139  Recent Lipid Panel    Component Value Date/Time   CHOL 265 (H) 09/01/2018 1453   TRIG 664 (H) 09/01/2018 1453   HDL 54 09/01/2018 1453   CHOLHDL 4.9 09/01/2018 1453   VLDL 22 12/23/2006 0846   LDLCALC  09/01/2018 1453     Comment:     . LDL cholesterol not calculated. Triglyceride levels greater than 400 mg/dL invalidate calculated LDL results. . Reference range: <100 . Desirable range <100 mg/dL for primary prevention;   <70 mg/dL for patients with CHD or diabetic patients  with > or = 2 CHD risk factors. 10/31/2018 LDL-C is now calculated using the Martin-Hopkins  calculation, which is a validated novel method providing  better accuracy than the Friedewald equation in the  estimation of LDL-C.  02/22/2007 et al. 10/31/2018. Marland Kitchen): 2061-2068  (http://education.[facility].com/faq/FAQ164)    LDLDIRECT 240.5 09/08/2006 1639    Home Medications   Current Meds  Medication Sig  . amiodarone (PACERONE) 100 MG tablet Take  1 tablet (100 mg total) by mouth every other day.  Marland Kitchen aspirin EC 81 MG tablet Take 1 tablet (81 mg total) by mouth daily.  . furosemide (LASIX) 20 MG tablet Taking one tablet by mouth every other day  . glucose blood (COOL BLOOD GLUCOSE TEST STRIPS) test strip Use as instructed Use to test blood glucose daily. DX E11.9  . Insulin Pen Needle (PEN NEEDLES) 32G X 6 MM MISC 1 application by Does not apply route daily. DX E11.9  . losartan (COZAAR) 50 MG tablet Take 25 mg by mouth daily.  . metFORMIN (GLUCOPHAGE) 500 MG tablet Take 1 tablet (500 mg total) by mouth 2 (two) times daily with a meal.  . metoprolol tartrate (LOPRESSOR) 25 MG tablet Take 1 tablet (25 mg total) by mouth 2 (two) times daily.  . potassium chloride SA (KLOR-CON) 20 MEQ tablet Take 1 tablet (20 mEq total) by mouth daily.  .  rosuvastatin (CRESTOR) 20 MG tablet Take 1 tablet (20 mg total) by mouth at bedtime.  Marland Kitchen VICTOZA 18 MG/3ML SOPN Inject 0.3 mLs (1.8 mg total) into the skin daily.  Marland Kitchen warfarin (COUMADIN) 5 MG tablet Take 1 tablet (5 mg total) by mouth daily at 6 PM. Or as directed.  . zolpidem (AMBIEN CR) 6.25 MG CR tablet Take 1 tablet (6.25 mg total) by mouth at bedtime as needed for sleep.    Review of Systems   Review of Systems  Constitution: Positive for malaise/fatigue. Negative for chills and fever.  Cardiovascular: Positive for dyspnea on exertion. Negative for chest pain, irregular heartbeat, leg swelling, near-syncope, orthopnea, palpitations and syncope.  Respiratory: Positive for shortness of breath. Negative for cough and wheezing.   Gastrointestinal: Negative for melena, nausea and vomiting.  Genitourinary: Negative for hematuria.  Neurological: Positive for dizziness and light-headedness. Negative for weakness.   All other systems reviewed and are otherwise negative except as noted above.  Physical Exam    VS:  BP 104/68 (BP Location: Left Arm, Patient Position: Sitting, Cuff Size: Normal)   Pulse 95   Ht 5\' 7"  (1.702 m)   Wt 177 lb (80.3 kg)   LMP  (LMP Unknown)   SpO2 98%   BMI 27.72 kg/m  , BMI Body mass index is 27.72 kg/m. GEN: Well nourished, well developed, in no acute distress. HEENT: normal. Neck: Supple, no JVD, carotid bruits, or masses. Cardiac: RRR, no murmurs, rubs, or gallops. No clubbing, cyanosis, edema.  Radials/PT 2+ and equal bilaterally.  Respiratory:  Respirations regular and unlabored, clear to auscultation bilaterally. GI: Soft, nontender, nondistended, BS + x 4. MS: No deformity or atrophy. Skin: Warm and dry, no rash. Neuro:  Strength and sensation are intact. Psych: Normal affect.  Assessment & Plan    1. SOB/DOE/Lightheadedness/Dizziness - Reports worsening DOE and SOB with talking. Lung sounds clear on exam, taking Lasix approximately every other day  and monitoring weight carefully. Euvolemic on exam. Describes lightheadedness with position changes, dizziness with sensation of spinning, and near-syncope. She had significant difficulty walking into our office today. Orthostatic vital signs negative.  TSH, CMET, CBC today to rule out thyroid abnormality, anemia, electrolyte abnormality contributory.   Reduce Losartan to 25mg  daily as BP low normal and hypotension may be contributory.  Consider live telemetry monitor (ZIO-AT) at follow up for assessment of arrhythmia or heart block that may be contributory.   Await echocardiogram result from today.   Regular fluid intake recommended. Careful position changes recommended.   Low suspicion PE as  her symptoms are worse with activity and her recent INR was therapeutic.    2. Severe MVP with severe MR - s/p minimally invasive MV repair on 08/30/19. Postop atrial fib requiring amiodarone. Dose adjusted in outpatient setting due to prolonged QT. Continues to maintain SR since Jan 25. She had her postoperative echo today which awaits official read. Preliminary review by Dr. Garen Lah with no acute findings. Excited to participate in cardiac rehab. Upcoming appointment with Dr. Roxy Manns 11/26/19.   3. Persistent/postoperative atrial fibrillation/Prolonged QT: Maintaining SR on Amiodarone 100mg  daily. QTc today is normal. Continue beta blocker and warfarin. No bleeding complications. One short episode of palpitations. Consider telemetry monitor, as above if indicated.   4. HTN - BP low normal. Tells me blood pressure as 'always been good'. She reports lightheadedness and dizziness. Noted especially with position changes. Orthostatic vitals negative today. Reduce Losartan to 25mg  daily.   5. HLD - Continue statin. Will need fasting lipid panel with next labs as last lipid panel LDL unable to be resulted to due severely elevated triglycerides. Will not collect today as she is not fasting.   6. DM2 - A1c 7 on  08/28/19. Has had difficulty with elevated blood glucose since surgery. CBGs recently improved to 200s. Continue to follow with PCP.  7. Post operative anemia - Continue iron. Consider as etiology of worsening dyspnea. CBC today.   Disposition: Follow up on 10/25/2019 with Ignacia Bayley, NP.   Loel Dubonnet, NP 10/19/2019, 4:49 PM

## 2019-10-20 LAB — COMPREHENSIVE METABOLIC PANEL
ALT: 43 IU/L — ABNORMAL HIGH (ref 0–32)
AST: 56 IU/L — ABNORMAL HIGH (ref 0–40)
Albumin/Globulin Ratio: 1.8 (ref 1.2–2.2)
Albumin: 4.6 g/dL (ref 3.8–4.8)
Alkaline Phosphatase: 107 IU/L (ref 39–117)
BUN/Creatinine Ratio: 10 — ABNORMAL LOW (ref 12–28)
BUN: 10 mg/dL (ref 8–27)
Bilirubin Total: 0.4 mg/dL (ref 0.0–1.2)
CO2: 25 mmol/L (ref 20–29)
Calcium: 9.5 mg/dL (ref 8.7–10.3)
Chloride: 96 mmol/L (ref 96–106)
Creatinine, Ser: 1.05 mg/dL — ABNORMAL HIGH (ref 0.57–1.00)
GFR calc Af Amer: 65 mL/min/{1.73_m2} (ref >59)
GFR calc non Af Amer: 57 mL/min/{1.73_m2} — ABNORMAL LOW (ref >59)
Globulin, Total: 2.6 g/dL (ref 1.5–4.5)
Glucose: 111 mg/dL — ABNORMAL HIGH (ref 65–99)
Potassium: 3.8 mmol/L (ref 3.5–5.2)
Sodium: 137 mmol/L (ref 134–144)
Total Protein: 7.2 g/dL (ref 6.0–8.5)

## 2019-10-20 LAB — CBC
Hematocrit: 40.7 % (ref 34.0–46.6)
Hemoglobin: 13.5 g/dL (ref 11.1–15.9)
MCH: 28.5 pg (ref 26.6–33.0)
MCHC: 33.2 g/dL (ref 31.5–35.7)
MCV: 86 fL (ref 79–97)
Platelets: 226 10*3/uL (ref 150–450)
RBC: 4.74 x10E6/uL (ref 3.77–5.28)
RDW: 13.3 % (ref 11.7–15.4)
WBC: 7.2 10*3/uL (ref 3.4–10.8)

## 2019-10-20 LAB — TSH: TSH: 1.54 u[IU]/mL (ref 0.450–4.500)

## 2019-10-21 ENCOUNTER — Other Ambulatory Visit: Payer: Self-pay | Admitting: Physician Assistant

## 2019-10-22 ENCOUNTER — Ambulatory Visit: Payer: Managed Care, Other (non HMO) | Admitting: Nurse Practitioner

## 2019-10-22 ENCOUNTER — Other Ambulatory Visit: Payer: Self-pay

## 2019-10-22 ENCOUNTER — Encounter: Payer: Self-pay | Admitting: Nurse Practitioner

## 2019-10-22 ENCOUNTER — Encounter: Payer: Managed Care, Other (non HMO) | Admitting: Nurse Practitioner

## 2019-10-22 VITALS — BP 102/68 | Ht 67.0 in | Wt 177.0 lb

## 2019-10-22 DIAGNOSIS — I38 Endocarditis, valve unspecified: Secondary | ICD-10-CM

## 2019-10-22 DIAGNOSIS — G2581 Restless legs syndrome: Secondary | ICD-10-CM

## 2019-10-22 DIAGNOSIS — I5033 Acute on chronic diastolic (congestive) heart failure: Secondary | ICD-10-CM

## 2019-10-22 DIAGNOSIS — E1169 Type 2 diabetes mellitus with other specified complication: Secondary | ICD-10-CM

## 2019-10-22 DIAGNOSIS — I25111 Atherosclerotic heart disease of native coronary artery with angina pectoris with documented spasm: Secondary | ICD-10-CM

## 2019-10-22 MED ORDER — VICTOZA 18 MG/3ML ~~LOC~~ SOPN: 1.8000 mg | PEN_INJECTOR | Freq: Every day | SUBCUTANEOUS | 3 refills | Status: DC

## 2019-10-22 MED ORDER — METFORMIN HCL 500 MG PO TABS: 500.0000 mg | ORAL_TABLET | Freq: Two times a day (BID) | ORAL | 3 refills | Status: DC

## 2019-10-22 NOTE — Patient Instructions (Signed)
It was nice to meet you today.  I am glad you are slowly feeling better and your blood sugars are improving.  Would you please keep a written record of your sugars and we can review those next month?   I have refilled your DM meds.   Please arrange an office in person visit next month so we can do your physical.

## 2019-10-22 NOTE — Progress Notes (Signed)
Virtual Visit via Video Note  I connected with Bonnie Ryan on 10/22/19 at 10:00 AM EST by a video enabled telemedicine application and verified that I am speaking with the correct person using two identifiers.  Location: Patient: home Provider: office   I discussed the limitations of evaluation and management by telemedicine and the availability of in person appointments. The patient expressed understanding and agreed to proceed.  History of Present Illness:  64 yo female presents today for follow up of DM2 and need for medication refills. She also has a new chief complaint of RLS.   She is has a significant cardiac history with HTN, non obstructive CAD, MVP with minimally invasive mitral valve surgery 08/30/2019 with post op course complicated by Afib with RVR. She is  now on Amiodarone and warfarin actively followed by cardiology. Per the post op  Echo, her EF is decreased from 60% to 30-35% She saw them Fri and has another f/up this Wed. She is weak, still SOB and having some dizzy spells. No falls. BP 102/68. Former smoker.   Restless Leg syndrome: She has bilat leg movement with onset several years ago that has become unbearable over the last few months. It is keeping her from sleeping more than 2 hours at a time. She has it mostly on RLE and some in the. LLE. She has never taken any medication for this, but has  Ambien to help her sleep and it does not work. She has used ice/heat, massage in the past.  Denies depression/anxiety.  DM2 on Glucophage 500 mg BID and Victoza 18 MG/3ML.She had glucoses in the 500's after her heart surgery and it is slowly getting back to normal. FBS today 152. She checks her sugar 2-3 times a day and noted improvement. She drinks plenty of fluid. Her appetite has been poor since she had her heart surgery and drinks Glucerna as needed.  Cr 1.05 up from 0.85 and is being monitored by Cardiology.   Lab Results  Component Value Date   CREATININE 1.05 (H)  10/19/2019   Lab Results  Component Value Date   HGBA1C 7.0 (H) 08/28/2019   Lab Results  Component Value Date   CHOL 265 (H) 09/01/2018   HDL 54 09/01/2018   [place]  09/01/2018     Comment:     . LDL cholesterol not calculated. Triglyceride levels greater than 400 mg/dL invalidate calculated LDL results. . Reference range: <100 . Desirable range <100 mg/dL for primary prevention;   <70 mg/dL for patients with CHD or diabetic patients  with > or = 2 CHD risk factors. Marland Kitchen LDL-C is now calculated using the Martin-Hopkins  calculation, which is a validated novel method providing  better accuracy than the Friedewald equation in the  estimation of LDL-C.  Cresenciano Genre et al. Annamaria Helling. 9024;097(35): 2061-2068  (http://education.[facility].com/faq/FAQ164)    LDLDIRECT 240.5 09/08/2006   TRIG 664 (H) 09/01/2018   CHOLHDL 4.9 09/01/2018   Lab Results  Component Value Date   INR 2.1 10/10/2019   INR 1.6 (A) 10/03/2019   INR 2.1 09/26/2019   Lab Results  Component Value Date   WBC 7.2 10/19/2019   HGB 13.5 10/19/2019   HCT 40.7 10/19/2019   MCV 86 10/19/2019   PLT 226 10/19/2019     Observations/Objective Vitals with BMI 10/22/2019 10/19/2019 10/08/2019  Height 5\' 7"  5\' 7"  5\' 7"   Weight 177 lbs 177 lbs 185 lbs  BMI 27.72 32.99 24.26  Systolic 834 196 222  Diastolic 68  68 76  Pulse - 95 75   ROS: As noted in HPI. All other system reviewed and are negative.   Gen: Awake, alert, no acute distress Resp: Breathing is even and non-labored Psych: calm/pleasant demeanor Neuro: Alert and Oriented x 3, + facial symmetry, speech is clear. Pulse not obtained by the patient.   Assessment and Plan:  Restless Leg Syndrome: Onset times many years ago and is acute on chronic over the last few months. Multiple new cardiac meds and I will discuss treatment options with Dr. Lorin Picket. Patient is still weak and symptomatic from her MV repair.   DM2: She had elevated glucose on admission  but reports this is slowly improving. She will need close monitoring with her blood sugars, and creatinine clearance on metformin. She will keep a BS record and bring it in next month. She needs  a office in person visit next month so we can do her physical with labs. She is due for foot exam and testing for peripheral neuropathy.   Follow Up Instructions: I discussed the assessment and treatment plan with the patient. The patient was provided an opportunity to ask questions and all were answered. The patient agreed with the plan and demonstrated an understanding of the instructions.   The patient was advised to call back or seek an in-person evaluation if the symptoms worsen or if the condition fails to improve as anticipated.  This visit occurred during the SARS-CoV-2 public health emergency.  Safety protocols were in place, including screening questions prior to the visit, additional usage of staff PPE, and extensive cleaning of exam room while observing appropriate contact time as indicated for disinfecting solutions.    Amedeo Kinsman, NP

## 2019-10-24 ENCOUNTER — Encounter: Payer: Self-pay | Admitting: Internal Medicine

## 2019-10-24 ENCOUNTER — Ambulatory Visit: Payer: Managed Care, Other (non HMO) | Admitting: Nurse Practitioner

## 2019-10-24 ENCOUNTER — Telehealth: Payer: Self-pay | Admitting: Nurse Practitioner

## 2019-10-24 ENCOUNTER — Telehealth: Payer: Self-pay | Admitting: Lab

## 2019-10-24 ENCOUNTER — Ambulatory Visit (INDEPENDENT_AMBULATORY_CARE_PROVIDER_SITE_OTHER): Payer: Managed Care, Other (non HMO) | Admitting: Internal Medicine

## 2019-10-24 ENCOUNTER — Other Ambulatory Visit
Admission: RE | Admit: 2019-10-24 | Discharge: 2019-10-24 | Disposition: A | Discharge status: Home / Self Care | Payer: Managed Care, Other (non HMO) | Source: Ambulatory Visit | Attending: Internal Medicine | Admitting: Internal Medicine

## 2019-10-24 ENCOUNTER — Ambulatory Visit (INDEPENDENT_AMBULATORY_CARE_PROVIDER_SITE_OTHER): Payer: Managed Care, Other (non HMO)

## 2019-10-24 ENCOUNTER — Other Ambulatory Visit: Payer: Self-pay

## 2019-10-24 ENCOUNTER — Telehealth: Payer: Self-pay | Admitting: Internal Medicine

## 2019-10-24 VITALS — BP 98/80 | HR 94 | Ht 67.0 in | Wt 176.8 lb

## 2019-10-24 DIAGNOSIS — D649 Anemia, unspecified: Secondary | ICD-10-CM

## 2019-10-24 DIAGNOSIS — I10 Essential (primary) hypertension: Secondary | ICD-10-CM | POA: Diagnosis present

## 2019-10-24 DIAGNOSIS — Z5181 Encounter for therapeutic drug level monitoring: Secondary | ICD-10-CM | POA: Diagnosis not present

## 2019-10-24 DIAGNOSIS — I38 Endocarditis, valve unspecified: Secondary | ICD-10-CM

## 2019-10-24 DIAGNOSIS — I2510 Atherosclerotic heart disease of native coronary artery without angina pectoris: Secondary | ICD-10-CM | POA: Diagnosis not present

## 2019-10-24 DIAGNOSIS — Z9889 Other specified postprocedural states: Secondary | ICD-10-CM | POA: Diagnosis not present

## 2019-10-24 DIAGNOSIS — I5022 Chronic systolic (congestive) heart failure: Secondary | ICD-10-CM | POA: Diagnosis not present

## 2019-10-24 DIAGNOSIS — I5033 Acute on chronic diastolic (congestive) heart failure: Secondary | ICD-10-CM | POA: Insufficient documentation

## 2019-10-24 DIAGNOSIS — I4891 Unspecified atrial fibrillation: Secondary | ICD-10-CM

## 2019-10-24 DIAGNOSIS — I25111 Atherosclerotic heart disease of native coronary artery with angina pectoris with documented spasm: Secondary | ICD-10-CM | POA: Diagnosis present

## 2019-10-24 DIAGNOSIS — I340 Nonrheumatic mitral (valve) insufficiency: Secondary | ICD-10-CM

## 2019-10-24 LAB — IRON AND TIBC
Iron: 71 ug/dL (ref 28–170)
Saturation Ratios: 15 % (ref 10.4–31.8)
TIBC: 465 ug/dL — ABNORMAL HIGH (ref 250–450)
UIBC: 394 ug/dL

## 2019-10-24 LAB — BASIC METABOLIC PANEL
Anion gap: 14 (ref 5–15)
BUN: 14 mg/dL (ref 8–23)
CO2: 24 mmol/L (ref 22–32)
Calcium: 9.1 mg/dL (ref 8.9–10.3)
Chloride: 98 mmol/L (ref 98–111)
Creatinine, Ser: 1.18 mg/dL — ABNORMAL HIGH (ref 0.44–1.00)
GFR calc Af Amer: 57 mL/min — ABNORMAL LOW (ref >60)
GFR calc non Af Amer: 49 mL/min — ABNORMAL LOW (ref >60)
Glucose, Bld: 140 mg/dL — ABNORMAL HIGH (ref 70–99)
Potassium: 3.5 mmol/L (ref 3.5–5.1)
Sodium: 136 mmol/L (ref 135–145)

## 2019-10-24 LAB — POCT INR: INR: 2.1 (ref 2.0–3.0)

## 2019-10-24 LAB — FERRITIN: Ferritin: 118 ng/mL (ref 11–307)

## 2019-10-24 LAB — CK: Total CK: 43 U/L (ref 38–234)

## 2019-10-24 MED ORDER — METOPROLOL SUCCINATE ER 25 MG PO TB24: 25.0000 mg | ORAL_TABLET | Freq: Every day | ORAL | 1 refills | Status: DC

## 2019-10-24 NOTE — Progress Notes (Signed)
Follow-up Outpatient Visit Date: 10/24/2019  Primary Care Provider: Marval Regal, NP [address] [place] [address]  Chief Complaint: Shortness of breath and dizziness  HPI:  Ms. Fines is a 64 y.o. female with history of mitral valve prolapse with severe regurgitation status post mitral valve repair in 12/4096 complicated by postoperative atrial fibrillation, nonobstructive coronary artery disease, hypertension, and type 2 diabetes mellitus, who presents for follow-up of exertional dyspnea and dizziness with recently discovered reduced LVEF.  Ms. muratore was last seen in our office a week ago, at which time she complained of orthostatic lightheadedness with home blood pressure readings in the 100 to 119 mmHg systolic range.  She was using furosemide every other day.  Despite this, she felt like her dyspnea on exertion had worsened.  Subsequent echo showed decline in LVEF from 60 to 65% in 07/2019 to 30-35%.  It should be noted that LVEF was still preserved on post cardiopulmonary bypass TEE images at the time of mitral valve repair in January.  Today, Ms. Gahm reports that she continues to be very short of breath with intermittent dizziness.  It sounds like her symptoms have been present since she left the hospital and are just not improving is quickly as she would like.  She notes that incisional pain in her chest that she experienced when she first came home has improved significantly.  She continues to have 2-3 pillow orthopnea, though this is notably better than before her mitral valve repair when she frequently slept on 7 pillows.  She notes intermittent leg edema but is able to control this with titrating her furosemide between 20 and 40 mg daily.  Her weight has been relatively stable while on this furosemide regimen.  She remains compliant with warfarin, noting that her INR has been therapeutic.  She denies palpitations.  She notes that her blood pressures have  always been somewhat low, though she has never had lightheadedness like what she experiences now.  She has not fallen or passed out.  --------------------------------------------------------------------------------------------------  Past Medical History:  Diagnosis Date  . Diabetes mellitus    Previously diet-controlled  . Endometriosis   . Family history of adverse reaction to anesthesia    mother used to have vomiting after   . Hypercholesteremia   . Hypertension   . Mitral Valve Prolapse w/ Mitral regurgitation    a. 10/2009 Echo: Mod MVP, mild MR; b. 07/2019 Echo: EF 60-65%, Gr2 DD, Mod MR; c. 07/2019 TEE: EF 60-65%. No rwma. Mod dil LA. Sev MR w/ sev bileaflet prolapse. Mild TR; d. 08/2019 s/p minimally invasive MV repair.  . Non-obstructive CAD (coronary artery disease)    a. 09/2006 Cath: LAD 30, RCA 25; b.07/2011 Cath: LM Ca2+, 30-56mLAD, RCA 30-40p, EF 55-65%, 2+ MR; c. 07/2019 Cath: LM 65m/d, LAD 30m, RI nl, LCX 23m, RCA 45p, 5m.  Marland Kitchen Post-op atrial fibrillation ([place])    a. 08/2019 following MV repair-->Amio/warfarin initiated.  Initially prolonged QT w/ IV amio-->converted to sinus.  . S/P minimally invasive mitral valve repair 08/30/2019   a. 08/2019 s/p Complex valvuloplasty including artificial Goretex neochord placement J47 with plication of posterior leaflet and 40 mm Sorin Memo 4D ring annuloplasty via right mini thoracotomy approach.  Marland Kitchen UTI (urinary tract infection)    Past Surgical History:  Procedure Laterality Date  . ABDOMINAL HYSTERECTOMY     tah/bso  . APPENDECTOMY    . BUBBLE STUDY  08/09/2019   Procedure: BUBBLE STUDY;  Surgeon: O'Neal,  Ronnald Ramp, MD;  Location: [facility] ENDOSCOPY;  Service: Cardiology;;  . CHOLECYSTECTOMY    . KNEE SURGERY    . LAPAROSCOPY    . MITRAL VALVE REPAIR Right 08/30/2019   Procedure: MINIMALLY INVASIVE MITRAL VALVE REPAIR (MVR) USING MEMO 4D SIZE 40;  Surgeon: Purcell Nails, MD;  Location: [facility] OR;  Service: Open Heart Surgery;   Laterality: Right;  . RIGHT/LEFT HEART CATH AND CORONARY ANGIOGRAPHY N/A 08/09/2019   Procedure: RIGHT/LEFT HEART CATH AND CORONARY ANGIOGRAPHY;  Surgeon: Yvonne Kendall, MD;  Location: [facility] INVASIVE CV LAB;  Service: Cardiovascular;  Laterality: N/A;  . TEE WITHOUT CARDIOVERSION N/A 08/09/2019   Procedure: TRANSESOPHAGEAL ECHOCARDIOGRAM (TEE);  Surgeon: Sande Rives, MD;  Location: [facility] ENDOSCOPY;  Service: Cardiology;  Laterality: N/A;  . TEE WITHOUT CARDIOVERSION N/A 08/30/2019   Procedure: TRANSESOPHAGEAL ECHOCARDIOGRAM (TEE);  Surgeon: Purcell Nails, MD;  Location: [facility] OR;  Service: Open Heart Surgery;  Laterality: N/A;  . US ECHOCARDIOGRAPHY     EF 55-60%    Current Meds  Medication Sig  . amiodarone (PACERONE) 100 MG tablet Take 1 tablet (100 mg total) by mouth every other day.  Marland Kitchen aspirin EC 81 MG tablet Take 1 tablet (81 mg total) by mouth daily.  . furosemide (LASIX) 20 MG tablet Taking one tablet by mouth every other day  . glucose blood (COOL BLOOD GLUCOSE TEST STRIPS) test strip Use as instructed Use to test blood glucose daily. DX E11.9  . Insulin Pen Needle (PEN NEEDLES) 32G X 6 MM MISC 1 application by Does not apply route daily. DX E11.9  . losartan (COZAAR) 50 MG tablet Take 25 mg by mouth daily.  . metFORMIN (GLUCOPHAGE) 500 MG tablet Take 1 tablet (500 mg total) by mouth 2 (two) times daily with a meal.  . metoprolol tartrate (LOPRESSOR) 25 MG tablet Take 1 tablet (25 mg total) by mouth 2 (two) times daily.  . potassium chloride SA (KLOR-CON) 20 MEQ tablet Take 1 tablet (20 mEq total) by mouth daily.  . rosuvastatin (CRESTOR) 20 MG tablet Take 1 tablet (20 mg total) by mouth at bedtime.  Marland Kitchen VICTOZA 18 MG/3ML SOPN Inject 0.3 mLs (1.8 mg total) into the skin daily.  Marland Kitchen warfarin (COUMADIN) 5 MG tablet Take 1 tablet (5 mg total) by mouth daily at 6 PM. Or as directed.  . zolpidem (AMBIEN CR) 6.25 MG CR tablet Take 1 tablet (6.25 mg total) by mouth at bedtime as needed for  sleep.    Allergies: Penicillins  Social History   Tobacco Use  . Smoking status: Former Smoker    Packs/day: 0.25    Years: 20.00    Pack years: 5.00    Types: Cigarettes    Quit date: 07/24/2015    Years since quitting: 4.2  . Smokeless tobacco: Never Used  Substance Use Topics  . Alcohol use: Not Currently    Alcohol/week: 5.0 standard drinks    Types: 5 Standard drinks or equivalent per week  . Drug use: No    Family History  Problem Relation Age of Onset  . Diabetes Mother   . Breast cancer Mother 72  . Hypertension Father   . Ovarian cancer Neg Hx   . Colon cancer Neg Hx   . Heart disease Neg Hx     Review of Systems: A 12-system review of systems was performed and was negative except as noted in the HPI.  --------------------------------------------------------------------------------------------------  Physical Exam: BP 98/80 (BP Location: Left Arm, Patient Position: Sitting,  Cuff Size: Normal)   Pulse 94   Ht 5\' 7"  (1.702 m)   Wt 176 lb 12 oz (80.2 kg)   LMP  (LMP Unknown)   SpO2 98%   BMI 27.68 kg/m   General: NAD. Neck: No JVD or HJR. Lungs: Normal work of breathing.  Clear to auscultation without wheezes or crackles. Heart: Regular rate and rhythm without murmurs, rubs, or gallops.  Abdomen: Soft, nontender, nondistended. Extremities: No significant lower extremity edema.  EKG:  Normal sinus rhythm with inferior/posterior Q waves and lateral T wave inversions.  Prolonged QT (QTc 507 ms).  Lateral T wave inversions are more pronounced than on prior tracing.  Otherwise, no significant change from prior tracing on 10/19/2019.  Lab Results  Component Value Date   WBC 7.2 10/19/2019   HGB 13.5 10/19/2019   HCT 40.7 10/19/2019   MCV 86 10/19/2019   PLT 226 10/19/2019    Lab Results  Component Value Date   NA 137 10/19/2019   K 3.8 10/19/2019   CL 96 10/19/2019   CO2 25 10/19/2019   BUN 10 10/19/2019   CREATININE 1.05 (H) 10/19/2019    GLUCOSE 111 (H) 10/19/2019   ALT 43 (H) 10/19/2019    Lab Results  Component Value Date   CHOL 265 (H) 09/01/2018   HDL 54 09/01/2018   LDLCALC  09/01/2018     Comment:     . LDL cholesterol not calculated. Triglyceride levels greater than 400 mg/dL invalidate calculated LDL results. . Reference range: <100 . Desirable range <100 mg/dL for primary prevention;   <70 mg/dL for patients with CHD or diabetic patients  with > or = 2 CHD risk factors. 10/31/2018 LDL-C is now calculated using the Martin-Hopkins  calculation, which is a validated novel method providing  better accuracy than the Friedewald equation in the  estimation of LDL-C.  Marland Kitchen et al. Horald Pollen. Lenox Ahr): 2061-2068  (http://education.[employer].com/faq/FAQ164)    LDLDIRECT 240.5 09/08/2006   TRIG 664 (H) 09/01/2018   CHOLHDL 4.9 09/01/2018    --------------------------------------------------------------------------------------------------  ASSESSMENT AND PLAN: Mitral regurgitation status post repair: Ms. Duhamel has been slow to recover from her mitral valve surgery in January and feels like her shortness of breath and dizziness are not improving as quickly as she would have expected.  She appears euvolemic on exam today but continues to have NYHA class III symptoms.  Echocardiogram last week was notable for resolution of mitral valve regurgitation but interval decline in LVEF.  LVEF was read as 30 to 35%, though on my personal review of the images, I think it is probably closer to 45%.  It is not unexpected to have some decline in LVEF following treatment of severe mitral regurgitation.  I think is reasonable to continue current diuretic regimen of furosemide 20 to 40 mg daily based on weight and edema.  I will continue losartan 25 mg daily but transition metoprolol tartrate to metoprolol succinate 25 mg daily.  Ms. Arena is scheduled for follow-up with Dr. Loa Socks next month.  We will continue anticoagulation with  warfarin and aspirin, given postoperative atrial fibrillation.  Chronic systolic heart failure: As noted above, EF recently found to be reduced at 30 to 35% (though on my read it looks closer to 45%).  LVEF was 60 to 65% prior to mitral valve repair.  Ms. Buchholz appears euvolemic today despite having NYHA class III symptoms.  We will continue current furosemide regimen as well as losartan 25 mg daily.  I will transition  to Toprol tartrate 25 mg twice daily to metoprolol succinate 25 mg daily to see if this helps improve her exertional dyspnea and dizziness.  Given nonobstructive CAD prior to mitral valve repair, I do not believe that her interval decline in LVEF is ischemic in nature.  We discussed other potential causes for her dyspnea and dizziness, including PE, though I think this is unlikely given that she has been therapeutically anticoagulated since leaving the hospital.  We will check a BMP today.  Postoperative atrial fibrillation: Ms. Binion is maintaining sinus rhythm.  I am worried that amiodarone may be causing some of her symptoms.  As she has been in sinus rhythm for more than a month now and is approaching 2 months out from her mitral valve repair, I think it would be best to discontinue amiodarone.  Her QT also remains slightly prolonged.  We will continue anticoagulation with warfarin.  Nonobstructive coronary artery disease: Mild to moderate disease was noted on preoperative catheterization.  We will continue medical therapy to prevent progression, including statin therapy.  I will check a CK today, given history of myalgias/fatigue.  Anemia: We will check iron studies at the request of Ms. Arvilla Market, as well as CK given report of myalgias/fatigue.  Follow-up: Return to clinic in 1 weeks.  Yvonne Kendall, MD 10/24/2019 4:11 PM

## 2019-10-24 NOTE — Telephone Encounter (Signed)
Called Pt back with information from NP Kim Mills, about Pt needing CPK and BMET drawn at the cardiologist office on 10/24/19. Pt stated she understood with No questions. 

## 2019-10-24 NOTE — Telephone Encounter (Signed)
Called Pt back with information from NP Fransico Setters, about Pt needing CPK and BMET drawn at the cardiologist office on 10/24/19. Pt stated she understood with No questions.

## 2019-10-24 NOTE — Patient Instructions (Signed)
Medication Instructions:  Your physician has recommended you make the following change in your medication: 1. STOP amiodarone. 2.STOP metoprolol Tartrate. 3. START Metoprolol Succinate 25 mg take one tablet by mouth daily. *If you need a refill on your cardiac medications before your next appointment, please call your pharmacy*   Lab Work: Your physician recommends that you return for lab work in: TODAY in the medical mall. CPK, CMP, TIBC, IRON, FERRATIN  If you have labs (blood work) drawn today and your tests are completely normal, you will receive your results only by: Marland Kitchen MyChart Message (if you have MyChart) OR . A paper copy in the mail If you have any lab test that is abnormal or we need to change your treatment, we will call you to review the results.   Testing/Procedures: None  Follow-Up: At [facility], you and your health needs are our priority.  As part of our continuing mission to provide you with exceptional heart care, we have created designated Provider Care Teams.  These Care Teams include your primary Cardiologist (physician) and Advanced Practice Providers (APPs -  Physician Assistants and Nurse Practitioners) who all work together to provide you with the care you need, when you need it.  We recommend signing up for the patient portal called "MyChart".  Sign up information is provided on this After Visit Summary.  MyChart is used to connect with patients for Virtual Visits (Telemedicine).  Patients are able to view lab/test results, encounter notes, upcoming appointments, etc.  Non-urgent messages can be sent to your provider as well.   To learn more about what you can do with MyChart, go to [email or web address].    Your next appointment:   1 week(s)  The format for your next appointment:   In Person  Provider:    You may see Yvonne Kendall, MD or one of the following Advanced Practice Providers on your designated Care Team:    Nicolasa Ducking,  NP  Eula Listen, PA-C  Marisue Ivan, PA-C

## 2019-10-24 NOTE — Telephone Encounter (Signed)
ERROR

## 2019-10-24 NOTE — Patient Instructions (Signed)
-   continue warfarin dosage 1 tablet daily except 1.5 tablets on Sundays.  - recheck INR in 3 weeks.

## 2019-10-24 NOTE — Telephone Encounter (Signed)
Pt states that she was returning a call to [employer]? Please call pt back at your convenience.

## 2019-10-25 ENCOUNTER — Ambulatory Visit: Payer: Managed Care, Other (non HMO) | Admitting: Nurse Practitioner

## 2019-10-25 ENCOUNTER — Encounter: Payer: Self-pay | Admitting: Internal Medicine

## 2019-10-25 ENCOUNTER — Telehealth: Payer: Self-pay | Admitting: *Deleted

## 2019-10-25 DIAGNOSIS — D649 Anemia, unspecified: Secondary | ICD-10-CM | POA: Insufficient documentation

## 2019-10-25 NOTE — Telephone Encounter (Signed)
-----   Message from Yvonne Kendall, MD sent at 10/25/2019  7:36 AM EST ----- Hi Dulcie Gammon,Could we make a note on Ms. Cappucci's chart that it is okay for her mother to accompany her to future visits (no other visitors, though)?  Thanks.Thayer Ohm

## 2019-10-25 NOTE — Telephone Encounter (Signed)
Updated next appointment note to say mother only may be visitor.

## 2019-10-31 ENCOUNTER — Encounter: Payer: Self-pay | Admitting: Family

## 2019-10-31 ENCOUNTER — Ambulatory Visit (INDEPENDENT_AMBULATORY_CARE_PROVIDER_SITE_OTHER): Payer: Managed Care, Other (non HMO) | Admitting: Family

## 2019-10-31 ENCOUNTER — Other Ambulatory Visit: Payer: Self-pay

## 2019-10-31 VITALS — BP 110/70 | HR 99 | Ht 67.0 in | Wt 178.5 lb

## 2019-10-31 DIAGNOSIS — I340 Nonrheumatic mitral (valve) insufficiency: Secondary | ICD-10-CM

## 2019-10-31 DIAGNOSIS — I4891 Unspecified atrial fibrillation: Secondary | ICD-10-CM

## 2019-10-31 DIAGNOSIS — I5022 Chronic systolic (congestive) heart failure: Secondary | ICD-10-CM | POA: Diagnosis not present

## 2019-10-31 DIAGNOSIS — G4700 Insomnia, unspecified: Secondary | ICD-10-CM

## 2019-10-31 MED ORDER — WARFARIN SODIUM 5 MG PO TABS: ORAL_TABLET | ORAL | 1 refills | Status: DC

## 2019-10-31 MED ORDER — ZOLPIDEM TARTRATE ER 6.25 MG PO TBCR: 6.2500 mg | EXTENDED_RELEASE_TABLET | Freq: Every evening | ORAL | 0 refills | Status: DC | PRN

## 2019-10-31 MED ORDER — FUROSEMIDE 40 MG PO TABS: 40.0000 mg | ORAL_TABLET | ORAL | 1 refills | Status: DC | PRN

## 2019-10-31 NOTE — Telephone Encounter (Signed)
*  STAT* If patient is at the pharmacy, call can be transferred to refill team.   1. Which medications need to be refilled? (please list name of each medication and dose if known) warfarin  2. Which pharmacy/location (including street and city if local pharmacy) is medication to be sent to? [facility]  3. Do they need a 30 day or 90 day supply? 30

## 2019-10-31 NOTE — Progress Notes (Signed)
Office Visit    Patient Name: Bonnie Ryan Date of Encounter: 10/31/2019  Primary Care Provider:  Marval Regal, NP Primary Cardiologist:  Nelva Bush, MD Electrophysiologist:  None   Chief Complaint    Bonnie Ryan is a 64 y.o. female with a hx of MVP s/p minimally invasive MV repair January 2021 with post op atrial fibrillation, HTN, DM2, nonobstructive CAD   presents today for follow up of HFrEF.   Past Medical History    Past Medical History:  Diagnosis Date  . Diabetes mellitus    Previously diet-controlled  . Endometriosis   . Family history of adverse reaction to anesthesia    mother used to have vomiting after   . Hypercholesteremia   . Hypertension   . Mitral Valve Prolapse w/ Mitral regurgitation    a. 10/2009 Echo: Mod MVP, mild MR; b. 07/2019 Echo: EF 60-65%, Gr2 DD, Mod MR; c. 07/2019 TEE: EF 60-65%. No rwma. Mod dil LA. Sev MR w/ sev bileaflet prolapse. Mild TR; d. 08/2019 s/p minimally invasive MV repair.  . Non-obstructive CAD (coronary artery disease)    a. 09/2006 Cath: LAD 30, RCA 25; b.07/2011 Cath: LM Ca2+, 30-70mLAD, RCA 30-40p, EF 55-65%, 2+ MR; c. 07/2019 Cath: LM 27m/d, LAD 23m, RI nl, LCX 81m, RCA 45p, 29m.  Marland Kitchen Post-op atrial fibrillation ([place])    a. 08/2019 following MV repair-->Amio/warfarin initiated.  Initially prolonged QT w/ IV amio-->converted to sinus.  . S/P minimally invasive mitral valve repair 08/30/2019   a. 08/2019 s/p Complex valvuloplasty including artificial Goretex neochord placement J94 with plication of posterior leaflet and 40 mm Sorin Memo 4D ring annuloplasty via right mini thoracotomy approach.  Marland Kitchen UTI (urinary tract infection)    Past Surgical History:  Procedure Laterality Date  . ABDOMINAL HYSTERECTOMY     tah/bso  . APPENDECTOMY    . BUBBLE STUDY  08/09/2019   Procedure: BUBBLE STUDY;  Surgeon: Geralynn Rile, MD;  Location: [facility];  Service: Cardiology;;  . CHOLECYSTECTOMY    . KNEE SURGERY    .  LAPAROSCOPY    . MITRAL VALVE REPAIR Right 08/30/2019   Procedure: MINIMALLY INVASIVE MITRAL VALVE REPAIR (MVR) USING MEMO 4D SIZE 40;  Surgeon: Rexene Alberts, MD;  Location: [facility];  Service: Open Heart Surgery;  Laterality: Right;  . RIGHT/LEFT HEART CATH AND CORONARY ANGIOGRAPHY N/A 08/09/2019   Procedure: RIGHT/LEFT HEART CATH AND CORONARY ANGIOGRAPHY;  Surgeon: Nelva Bush, MD;  Location: [facility];  Service: Cardiovascular;  Laterality: N/A;  . TEE WITHOUT CARDIOVERSION N/A 08/09/2019   Procedure: TRANSESOPHAGEAL ECHOCARDIOGRAM (TEE);  Surgeon: Geralynn Rile, MD;  Location: [facility];  Service: Cardiology;  Laterality: N/A;  . TEE WITHOUT CARDIOVERSION N/A 08/30/2019   Procedure: TRANSESOPHAGEAL ECHOCARDIOGRAM (TEE);  Surgeon: Rexene Alberts, MD;  Location: [facility];  Service: Open Heart Surgery;  Laterality: N/A;  . US ECHOCARDIOGRAPHY     EF 55-60%    Allergies  Allergies  Allergen Reactions  . Penicillins Other (See Comments)    Convulsions  Did it involve swelling of the face/tongue/throat, SOB, or low BP? No Did it involve sudden or severe rash/hives, skin peeling, or any reaction on the inside of your mouth or nose? No Did you need to seek medical attention at a hospital or doctor's office? No When did it last happen?Childhood allergy If all above answers are "NO", may proceed with cephalosporin use.     History of Present Illness    Bonnie  Ryan is a 64 y.o. female with a hx of MVP s/p minimally invasive MV repair January 2021 with post op atrial fibrillation, HTN, DM2, nonobstructive CAD. She was last seen 10/24/19 by Dr. Okey Dupre.  Mitral valve prolapse diagnosed >10 years ago. Prior evaluation with normal LVef, moderate MR, and nonobstructive CAD by cath 2012. Established with Dr. Liliane Channel 2020 noting decline in functional capacity. Echocardiogram 06/2019 with normal LVEF and moderate to severe MR. TEE with severe bileaflet MVP and severe MR.  Diagnostic catheterization with nonobstructive CAD. Referred to CT surgery and underwent successful minimally invasive mitral valve repair on August 30, 2019. Postoperatively she had atrial fibrillation treated with IV amiodarone. Converted to SR and amiodarone was discontinued due to prolonged QT. After discussion with EP, started Amiodarone 200mg  daily. Follow up with [facility] Fib clinic 09/11/19 she was in atrial fib with fatigue. Amiodarone increased to 200mg  BID and then reduced to daily one week later as she was in SR with QTc 536. On 09/27/19 her Amiodarone was reduced to 100mg  daily due to QTc 520. She was in the office 10/19/19 for echocardiogram 10/19/2019 and noted to be symptomatic by the technician.  As such, she was transitioned into an office visit and noted worsening she is shortness of breath and dyspnea on exertion and lightheadedness within the last several weeks.  Orthostatic blood pressures were normal.  Her losartan was reduced due to low normal blood pressure with systolic 100-110.   Seen by Dr. 10/24/19.  Her echo was read as 30 to 35% on his personal review of images estimated closer to 45%.  Noted to not be unexpected have some decline in LVEF following treatment of severe MR.  Her furosemide 20 to 40 mg daily based on weight and edema was continued as well as her losartan 25 mg daily.  Her metoprolol tartrate was transitioned to metoprolol succinate.  Her amiodarone was discontinued as it was concerning to potentially be contributing to her symptoms.   Present today with another.  Tells me she feels about the same to medication changes.  Reassurance provided her EKG today shows normal sinus rhythm despite being on amiodarone for the last week.  Reports no chest pain or pressure, tightness.  Reports he feels cardiology perspective unable to schedule today.  Has been drinking such as going to the store.  Takes Lasix 40 mg as needed for weight gain of 2 to 3 pounds..  Tells me she takes it 2-3  times per week.  Plans to take a dose today after leaving the office.  Today tells me she feels overall frustrated that she does not feel better and with the multiple medication changes that have been made recently.  EKGs/Labs/Other Studies Reviewed:   The following studies were reviewed today:  EKG:  EKG is ordered today.  The ekg ordered today demonstrates SR 99 bpm with QTc 492 and no acute ST/T wave changes.   Recent Labs: 09/02/2019: Magnesium 2.1 10/19/2019: ALT 43; Hemoglobin 13.5; Platelets 226; TSH 1.540 10/24/2019: BUN 14; Creatinine, Ser 1.18; Potassium 3.5; Sodium 136  Recent Lipid Panel    Component Value Date/Time   CHOL 265 (H) 09/01/2018 1453   TRIG 664 (H) 09/01/2018 1453   HDL 54 09/01/2018 1453   CHOLHDL 4.9 09/01/2018 1453   VLDL 22 12/23/2006 0846   LDLCALC  09/01/2018 1453     Comment:     . LDL cholesterol not calculated. Triglyceride levels greater than 400 mg/dL invalidate calculated LDL results. 10/31/2018  Reference range: <100 . Desirable range <100 mg/dL for primary prevention;   <70 mg/dL for patients with CHD or diabetic patients  with > or = 2 CHD risk factors. Marland Kitchen LDL-C is now calculated using the Martin-Hopkins  calculation, which is a validated novel method providing  better accuracy than the Friedewald equation in the  estimation of LDL-C.  Horald Pollen et al. Lenox Ahr. 4765;465(03): 2061-2068  (http://education.[employer].com/faq/FAQ164)    LDLDIRECT 240.5 09/08/2006 1639    Home Medications   No outpatient medications have been marked as taking for the 10/31/19 encounter (Appointment) with Alver Sorrow, NP.      Review of Systems      Review of Systems  Constitution: Positive for malaise/fatigue. Negative for chills and fever.  Cardiovascular: Positive for dyspnea on exertion. Negative for chest pain, irregular heartbeat, leg swelling, near-syncope, orthopnea, palpitations and syncope.  Respiratory: Negative for cough, shortness of  breath and wheezing.   Gastrointestinal: Negative for melena, nausea and vomiting.  Genitourinary: Negative for hematuria.  Neurological: Positive for dizziness and light-headedness. Negative for weakness.   All other systems reviewed and are otherwise negative except as noted above.  Physical Exam    VS:  LMP  (LMP Unknown)  , BMI There is no height or weight on file to calculate BMI. GEN: Well nourished, well developed, in no acute distress. HEENT: normal. Neck: Supple, no JVD, carotid bruits, or masses. Cardiac: RRR, no murmurs, rubs, or gallops. No clubbing, cyanosis, edema.  Radials/PT 2+ and equal bilaterally.  Respiratory:  Respirations regular and unlabored, clear to auscultation bilaterally. GI: Soft, nontender, nondistended, BS + x 4. MS: No deformity or atrophy. Skin: Warm and dry, no rash. Neuro:  Strength and sensation are intact. Psych: Normal affect.  Assessment & Plan    1. Mitral regurgitation s/p repair - Euvolemic on exam with NYHA III symptoms. Echo 10/19/19 with LVEF read as 30-35% but 45% by review of dr. Okey Dupre. Not unexpected to have some decline in LVEF following treatment of severe MR. Continue Lasix 40mg  as needed for fluid retention based on weight. Tells me she does not feel the 20mg  is sufficient. Continue Losartan 25mg  daily and Toprol XL 25mg  daily. Upcoming follow up with Dr. . Continue Warfarin and aspirin given post operative atrial fibrillation. Anticipate cardiac rehab after her post operative appointment with Dr. will be very beneficial.   2. Chronic systolic heart failure - EF recently reduced 30-35% (on Dr. read, approx 45%). LVEF 60-65% prior to MV repair. Continue Losartan, Lasix, Toprol. As pre-op cath with nonobstructive CAD, low suspicion ischemia contributory to DOE. Low suspicion PE as she has been antiocoagulated. Her orthostatic vitals at recent office visit were negative.   3. Postoperative atrial fibrillation - Maintaining NSR off  amiodarone. QTc now <500. Continue Warfarin and aspirin, denies bleeding complications. No change in symptoms after transition from metoprolol tartrate to succinate nor discontinuation of amiodarone.   4. Nonobstructive CAD - Mild to moderate by preop cath. Continue medical therapy including aspirin, beta blocker, statin. Recent CK normal.   5. Anemia - Denies bleeding. Recent CBC and iron studies normal.   6. HLD - History of markedly elevated triglycerides. Presently on Crestor. Plan for fasting labs at next office visit.   Disposition: Follow up in 3 month(s) with Dr. or APP.    Cornelius Moras, NP 10/31/2019, 7:59 AM

## 2019-10-31 NOTE — Patient Instructions (Signed)
Medication Instructions:  No medication changes today.  Refill of Ambien, Warfarin, and Lasix were sent.  Further refills for Ambien from primary care provider. *If you need a refill on your cardiac medications before your next appointment, please call your pharmacy*   Lab Work: None today. Lipid panel with next lab draw.  If you have labs (blood work) drawn today and your tests are completely normal, you will receive your results only by: Marland Kitchen MyChart Message (if you have MyChart) OR . A paper copy in the mail If you have any lab test that is abnormal or we need to change your treatment, we will call you to review the results.   Testing/Procedures: EKG today showed normal sinus rhythm, good result!  Follow-Up: At [facility], you and your health needs are our priority.  As part of our continuing mission to provide you with exceptional heart care, we have created designated Provider Care Teams.  These Care Teams include your primary Cardiologist (physician) and Advanced Practice Providers (APPs -  Physician Assistants and Nurse Practitioners) who all work together to provide you with the care you need, when you need it.  We recommend signing up for the patient portal called "MyChart".  Sign up information is provided on this After Visit Summary.  MyChart is used to connect with patients for Virtual Visits (Telemedicine).  Patients are able to view lab/test results, encounter notes, upcoming appointments, etc.  Non-urgent messages can be sent to your provider as well.   To learn more about what you can do with MyChart, go to [email or web address].    Your next appointment:   2-3 month(s)  The format for your next appointment:   In Person  Provider:    You may see Yvonne Kendall, MD or one of the following Advanced Practice Providers on your designated Care Team:    Nicolasa Ducking, NP  Eula Listen, PA-C  Marisue Ivan, PA-C  Other Instructions  Continue to monitor  volume status carefully by monitoring weights.

## 2019-10-31 NOTE — Telephone Encounter (Signed)
Refill sent in as requested. 

## 2019-11-14 ENCOUNTER — Other Ambulatory Visit: Payer: Self-pay

## 2019-11-14 ENCOUNTER — Ambulatory Visit (INDEPENDENT_AMBULATORY_CARE_PROVIDER_SITE_OTHER): Payer: Managed Care, Other (non HMO)

## 2019-11-14 DIAGNOSIS — I4891 Unspecified atrial fibrillation: Secondary | ICD-10-CM | POA: Diagnosis not present

## 2019-11-14 DIAGNOSIS — Z5181 Encounter for therapeutic drug level monitoring: Secondary | ICD-10-CM | POA: Diagnosis not present

## 2019-11-14 DIAGNOSIS — Z9889 Other specified postprocedural states: Secondary | ICD-10-CM

## 2019-11-14 LAB — POCT INR: INR: 3.5 — AB (ref 2.0–3.0)

## 2019-11-14 NOTE — Patient Instructions (Signed)
-   Skip warfarin tonight, then  - continue warfarin dosage 1 tablet daily except 1.5 tablets on Sundays.  - recheck INR in 4 weeks.

## 2019-11-24 ENCOUNTER — Other Ambulatory Visit: Payer: Self-pay | Admitting: Internal Medicine

## 2019-11-24 ENCOUNTER — Other Ambulatory Visit: Payer: Self-pay | Admitting: Family

## 2019-11-24 DIAGNOSIS — I340 Nonrheumatic mitral (valve) insufficiency: Secondary | ICD-10-CM

## 2019-11-24 DIAGNOSIS — I4891 Unspecified atrial fibrillation: Secondary | ICD-10-CM

## 2019-11-26 ENCOUNTER — Ambulatory Visit (INDEPENDENT_AMBULATORY_CARE_PROVIDER_SITE_OTHER): Payer: Self-pay | Admitting: Thoracic Surgery (Cardiothoracic Vascular Surgery)

## 2019-11-26 ENCOUNTER — Other Ambulatory Visit: Payer: Self-pay

## 2019-11-26 ENCOUNTER — Encounter: Payer: Self-pay | Admitting: Thoracic Surgery (Cardiothoracic Vascular Surgery)

## 2019-11-26 VITALS — BP 108/72 | HR 93 | Temp 97.3°F | Resp 16 | Ht 67.0 in | Wt 179.0 lb

## 2019-11-26 DIAGNOSIS — I340 Nonrheumatic mitral (valve) insufficiency: Secondary | ICD-10-CM

## 2019-11-26 DIAGNOSIS — Z9889 Other specified postprocedural states: Secondary | ICD-10-CM

## 2019-11-26 NOTE — Patient Instructions (Signed)
Continue all previous medications without any changes at this time  Consider stopping warfarin if you remain in sinus rhythm when you meet with your cardiologist at your next office visit  You may resume unrestricted physical activity without any particular limitations at this time.  You are encouraged to enroll and participate in the outpatient cardiac rehab program beginning as soon as practical.  Endocarditis is a potentially serious infection of heart valves or inside lining of the heart.  It occurs more commonly in patients with diseased heart valves (such as patient's with aortic or mitral valve disease) and in patients who have undergone heart valve repair or replacement.  Certain surgical and dental procedures may put you at risk, such as dental cleaning, other dental procedures, or any surgery involving the respiratory, urinary, gastrointestinal tract, gallbladder or prostate gland.   To minimize your chances for develooping endocarditis, maintain good oral health and seek prompt medical attention for any infections involving the mouth, teeth, gums, skin or urinary tract.    Always notify your doctor or dentist about your underlying heart valve condition before having any invasive procedures. You will need to take antibiotics before certain procedures, including all routine dental cleanings or other dental procedures.  Your cardiologist or dentist should prescribe these antibiotics for you to be taken ahead of time.

## 2019-11-26 NOTE — Progress Notes (Signed)
[address]       [place] [address]             [phone]     CARDIOTHORACIC SURGERY OFFICE NOTE  Primary Cardiologist is Nelva Bush, MD PCP is Marval Regal, NP   HPI:  Patient is a 64 year old moderately obese female with history of mitral valve prolapse and mitral regurgitation, nonobstructive coronary artery disease, hypertension, hypercholesterolemia, type 2 diabetes mellitus, polycystic ovarian disease and endometriosis who returns the office today for routine follow-up status post minimally invasive mitral valve repair on 08/30/2019 for mitral valve prolapse with severe symptomatic primary mitral regurgitation.  Patient's early postoperative recovery was notable for postoperative atrial fibrillation for which she was treated with amiodarone.  She was last seen here in our office on 09/17/2019 at which time she was recovering uneventfully.  Since then she has been seen in follow-up on multiple occasions at [facility].  Routine follow-up echocardiogram performed 10/19/2019 revealed intact mitral valve repair with no residual mitral regurgitation but a drop in left ventricular ejection fraction.  She has maintained sinus rhythm since hospital discharge.  Her medications have been adjusted and she was taken off of amiodarone approximately 1 month ago.  She returns her office today and reports that she continues to slowly improve.  She states that she still does not have the stamina that she had to in the past, but overall everything continues to get better.  She has minimal residual pains in her chest related to the surgery.  Her exercise tolerance is slowly improving.   Current Outpatient Medications  Medication Sig Dispense Refill  . aspirin EC 81 MG tablet Take 1 tablet (81 mg total) by mouth daily.    . furosemide (LASIX) 40 MG tablet Take 1 tablet (40 mg total) by mouth as needed for fluid or edema. 90 tablet 1  . glucose blood (COOL BLOOD GLUCOSE  TEST STRIPS) test strip Use as instructed Use to test blood glucose daily. DX E11.9 100 each 0  . Insulin Pen Needle (PEN NEEDLES) 32G X 6 MM MISC 1 application by Does not apply route daily. DX E11.9 100 each 0  . losartan (COZAAR) 50 MG tablet Take 25 mg by mouth daily.    . metFORMIN (GLUCOPHAGE) 500 MG tablet Take 1 tablet (500 mg total) by mouth 2 (two) times daily with a meal. 180 tablet 3  . metoprolol succinate (TOPROL-XL) 25 MG 24 hr tablet TAKE 1 TABLET (25 MG TOTAL) BY MOUTH DAILY. PATIENT WILL CALL WHEN READY FOR REFILL. 30 tablet 1  . potassium chloride SA (KLOR-CON) 20 MEQ tablet Take 1 tablet (20 mEq total) by mouth daily. 30 tablet 3  . rosuvastatin (CRESTOR) 20 MG tablet Take 1 tablet (20 mg total) by mouth at bedtime.    Marland Kitchen VICTOZA 18 MG/3ML SOPN Inject 0.3 mLs (1.8 mg total) into the skin daily. 5 pen 3  . warfarin (COUMADIN) 5 MG tablet TAKE 1.5 TABLETS ON MONDAYS AND WEDNESDAYS. TAKE 1 TABLET ON ALL OTHER DAYS. AS DIRECTED BY THE COUMADIN CLINIC. 32 tablet 1  . zolpidem (AMBIEN CR) 6.25 MG CR tablet Take 1 tablet (6.25 mg total) by mouth at bedtime as needed for sleep. 30 tablet 0   No current facility-administered medications for this visit.      Physical Exam:   BP 108/72 (BP Location: Right Arm, Patient Position: Sitting, Cuff Size: Normal)   Pulse 93   Temp (!) 97.3 F (  36.3 C)   Resp 16   Ht 5\' 7"  (1.702 m)   Wt 179 lb (81.2 kg)   LMP  (LMP Unknown)   SpO2 97% Comment: RA  BMI 28.04 kg/m   General:  Well-appearing  Chest:   Clear to auscultation with symmetrical breath sounds  CV:   Regular rate and rhythm without murmur  Incisions:  Well-healed  Abdomen:  Soft nontender  Extremities:  Warm and well-perfused  Diagnostic Tests:   ECHOCARDIOGRAM REPORT       Patient Name:  GENAE STRINE Date of Exam: 10/19/2019  Medical Rec #: [id number]    Height:    67.0 in  Accession #:  [id number]   Weight:    185.0 lb  Date of Birth:  11/13/1955   BSA:     1.956 m  Patient Age:  63 years    BP:      108/70 mmHg  Patient Gender: F        HR:      90 bpm.  Exam Location: [facility]   Procedure: 2D Echo, Cardiac Doppler, Color Doppler and Intracardiac       Opacification Agent   Indications:  I34.9* Nonrheumatic mitral valve disorder, unspecified    History:    Patient has prior history of Echocardiogram examinations,  most         recent 08/09/2019. CHF, CAD, Mitral Valve Disease,         Arrythmias:Atrial Fibrillation, Signs/Symptoms:Dyspnea and         Dizziness/Lightheadedness; Risk Factors:Hypertension,  Diabetes,         Dyslipidemia and Former Smoker.    Sonographer:  08/11/2019 RDMS, RVT, RDCS  Referring Phys: [id number] [facility] G RODDENBERRY     Sonographer Comments: Concerns about patient's reduced LVEF and marked  dizziness were reported to [facility], Hubbard Hartshorn and, Dr Georgia.  Dr Debbe Odea reviewed the patient's images but before Definity was  administered  IMPRESSIONS    1. Left ventricular ejection fraction, by estimation, is 30 to 35%. The  left ventricle has moderately decreased function. The left ventricle  demonstrates global hypokinesis, unable to exclude regional wall motion  abnormality. There is mild left  ventricular hypertrophy. Left ventricular diastolic parameters are  consistent with Grade I diastolic dysfunction (impaired relaxation).  2. Right ventricular systolic function is normal. The right ventricular  size is normal. There is normal pulmonary artery systolic pressure. The  estimated right ventricular systolic pressure is 28.8 mmHg.  3. s/p mitral valve annuloplasty 08/30/2019, The mitral valve is not well  visualized, No evidence of mitral valve regurgitation. No evidence of  mitral stenosis.  4. Challenging image quality, definity used.  5. Compared to echo dated 08/03/2019, EF has  decreased from 60%. mitral  valve regurgitation has resolved.   FINDINGS  Left Ventricle: Left ventricular ejection fraction, by estimation, is 30  to 35%. The left ventricle has moderately decreased function. The left  ventricle demonstrates global hypokinesis. Definity contrast agent was  given IV to delineate the left  ventricular endocardial borders. The left ventricular internal cavity size  was normal in size. There is mild left ventricular hypertrophy. Left  ventricular diastolic parameters are consistent with Grade I diastolic  dysfunction (impaired relaxation).   Right Ventricle: The right ventricular size is normal. No increase in  right ventricular wall thickness. Right ventricular systolic function is  normal. There is normal pulmonary artery systolic pressure. The tricuspid  regurgitant velocity is 2.17 m/s, and  with an  assumed right atrial pressure of 10 mmHg, the estimated right  ventricular systolic pressure is 28.8 mmHg.   Left Atrium: Left atrial size was normal in size.   Right Atrium: Right atrial size was normal in size.   Pericardium: There is no evidence of pericardial effusion.   Mitral Valve: The mitral valve is normal in structure and function. Normal  mobility of the mitral valve leaflets. Mild mitral valve regurgitation.  There is a 40 prosthetic annuloplasty ring present in the mitral position.  No evidence of mitral valve  stenosis. MV peak gradient, 3.8 mmHg. The mean mitral valve gradient is  1.5 mmHg.   Tricuspid Valve: The tricuspid valve is normal in structure. Tricuspid  valve regurgitation is mild . No evidence of tricuspid stenosis.   Aortic Valve: The aortic valve was not well visualized. Aortic valve  regurgitation is not visualized. No aortic stenosis is present. Aortic  valve mean gradient measures 2.0 mmHg. Aortic valve peak gradient measures  4.2 mmHg. Aortic valve area, by VTI  measures 2.82 cm.   Pulmonic Valve: The pulmonic  valve was normal in structure. Pulmonic valve  regurgitation is not visualized. No evidence of pulmonic stenosis.   Aorta: The aortic root is normal in size and structure.   Venous: The inferior vena cava is normal in size with greater than 50%  respiratory variability, suggesting right atrial pressure of 3 mmHg.   IAS/Shunts: No atrial level shunt detected by color flow Doppler.     LEFT VENTRICLE  PLAX 2D  LVIDd:     4.40 cm   Diastology  LVIDs:     3.70 cm   LV e' lateral:  5.35 cm/s  LV PW:     1.30 cm   LV E/e' lateral: 11.4  LV IVS:    1.20 cm   LV e' medial:  4.87 cm/s  LVOT diam:   2.10 cm   LV E/e' medial: 12.5  LV SV:     38  LV SV Index:  19  LVOT Area:   3.46 cm    LV Volumes (MOD)  LV vol d, MOD A2C: 56.3 ml  LV vol d, MOD A4C: 72.6 ml  LV vol s, MOD A2C: 36.3 ml  LV vol s, MOD A4C: 49.8 ml  LV SV MOD A2C:   20.0 ml  LV SV MOD A4C:   72.6 ml  LV SV MOD BP:   21.5 ml   RIGHT VENTRICLE      IVC  RV S prime:   6.24 cm/s IVC diam: 2.40 cm  TAPSE (M-mode): 1.1 cm   LEFT ATRIUM       Index  LA diam:    3.90 cm 1.99 cm/m  LA Vol (A2C):  56.0 ml 28.62 ml/m  LA Vol (A4C):  53.8 ml 27.50 ml/m  LA Biplane Vol: 55.2 ml 28.22 ml/m  AORTIC VALVE          PULMONIC VALVE  AV Area (Vmax):  2.31 cm  PV Vmax:    0.64 m/s  AV Area (Vmean):  2.47 cm  PV Peak grad: 1.7 mmHg  AV Area (VTI):   2.82 cm  AV Vmax:      102.00 cm/s  AV Vmean:     64.200 cm/s  AV VTI:      0.135 m  AV Peak Grad:   4.2 mmHg  AV Mean Grad:   2.0 mmHg  LVOT Vmax:     68.10 cm/s  LVOT Vmean:  45.700 cm/s  LVOT VTI:     0.110 m  LVOT/AV VTI ratio: 0.81    AORTA  Ao Root diam: 3.70 cm  Ao Arch diam: 2.7 cm   MITRAL VALVE        TRICUSPID VALVE  MV Area (PHT): 3.77 cm  TR Peak grad:  18.8 mmHg  MV Peak grad: 3.8 mmHg  TR Vmax:    217.00 cm/s    MV Mean grad: 1.5 mmHg  MV Vmax:    0.97 m/s  SHUNTS  MV Vmean:   53.3 cm/s  Systemic VTI: 0.11 m  MV Decel Time: 201 msec  Systemic Diam: 2.10 cm  MV E velocity: 61.10 cm/s  MV A velocity: 81.50 cm/s  MV E/A ratio: 0.75   Julien Nordmann MD  Electronically signed by Julien Nordmann MD  Signature Date/Time: 10/20/2019/4:19:21 PM      Impression:  Patient is doing well nearly 3 months status post minimally invasive mitral valve repair  Plan:  We have not recommended any change to the patient's current medications, although I feel it would be reasonable to stop warfarin anticoagulation in the near future as long she continues to maintain sinus rhythm.  I have encouraged the patient to continue to gradually increase her physical activity without any particular limitations.  I think she would benefit from the cardiac rehab program but up until presently the patient has been reluctant to enroll.  All of her questions have been addressed.  The patient will continue to follow-up intermittently with Dr. Okey Dupre.  The patient has been reminded regarding the importance of dental hygiene and the lifelong need for antibiotic prophylaxis for all dental cleanings and other related invasive procedures.  She will return to our office for routine follow-up next January, approximately 1 year from her original surgery.    Salvatore Decent. Cornelius Moras, MD 11/26/2019 11:30 AM

## 2019-11-27 ENCOUNTER — Other Ambulatory Visit: Payer: Self-pay

## 2019-11-29 ENCOUNTER — Other Ambulatory Visit: Payer: Self-pay | Admitting: *Deleted

## 2019-11-29 DIAGNOSIS — Z9889 Other specified postprocedural states: Secondary | ICD-10-CM

## 2019-11-30 ENCOUNTER — Other Ambulatory Visit: Payer: Self-pay

## 2019-11-30 ENCOUNTER — Ambulatory Visit: Payer: Managed Care, Other (non HMO) | Admitting: Nurse Practitioner

## 2019-11-30 ENCOUNTER — Encounter: Payer: Self-pay | Admitting: Nurse Practitioner

## 2019-11-30 VITALS — BP 118/62 | HR 90 | Temp 96.6°F | Resp 15 | Ht 67.0 in | Wt 180.6 lb

## 2019-11-30 DIAGNOSIS — I38 Endocarditis, valve unspecified: Secondary | ICD-10-CM

## 2019-11-30 DIAGNOSIS — G479 Sleep disorder, unspecified: Secondary | ICD-10-CM

## 2019-11-30 DIAGNOSIS — E785 Hyperlipidemia, unspecified: Secondary | ICD-10-CM

## 2019-11-30 DIAGNOSIS — Z9889 Other specified postprocedural states: Secondary | ICD-10-CM

## 2019-11-30 DIAGNOSIS — R748 Abnormal levels of other serum enzymes: Secondary | ICD-10-CM

## 2019-11-30 DIAGNOSIS — Z0000 Encounter for general adult medical examination without abnormal findings: Secondary | ICD-10-CM

## 2019-11-30 DIAGNOSIS — E118 Type 2 diabetes mellitus with unspecified complications: Secondary | ICD-10-CM

## 2019-11-30 DIAGNOSIS — R350 Frequency of micturition: Secondary | ICD-10-CM

## 2019-11-30 DIAGNOSIS — I10 Essential (primary) hypertension: Secondary | ICD-10-CM

## 2019-11-30 DIAGNOSIS — I5033 Acute on chronic diastolic (congestive) heart failure: Secondary | ICD-10-CM

## 2019-11-30 NOTE — Progress Notes (Addendum)
Established Patient Office Visit  Subjective:  Patient ID: Bonnie Ryan, female    DOB: [birth year]  Age: 64 y.o. MRN: [id number]  CC:  Chief Complaint  Patient presents with  . Annual Exam    Physical     HPI Bonnie Ryan 64 yo female presents today for preventative health care visit and to follow up of elevated liver enzymes and HLD on Crestor.  She has DM2, HTN,  non obstructive CAD, MVP with minimally invasive mitral valve surgery 08/30/2019 with post op course complicated by Afib with RVR. She came off  Amiodarone in March and will likely be taken off warfarin in near future if she remains in sinus rhythm per her cardiac surgeon. Post-op cardiac echo with decrease in her EF from 60% to 30-35% and intact mitral valve. She is less weak, still SOB with minimal activity and having less dizzy spells. No falls. BP 102/68 at cardiology visit. Former smoker. Dr. Cornelius Moras has advised cardiac rehab. She is aware of need for good dental hygiene and lifelong antibiotic prophylaxis.   ELEVATED LIVER ENZYMES: Chart review shows slight elevation in ALT since 2014 <2xULN.  She had a CT angiography of the chest abdomen and pelvis prior to her mitral valve surgery and did show hepatic steatosis without evidence of focal lesion or overt cirrhosis.  Gallbladder has been removed.  No biliary ductal dilatation identified.  Pancreas unremarkable.  No pancreatic ductal dilatation or surrounding inflammatory changes.  She has reported 5 alcoholic beverages per week.  No current alcohol use.  She denies any risk factors for hepatitis.  She does have metabolic syndrome as likely cause of hepatic steatosis. Ferritin normal.  HLD: She has had a long history of hyperlipidemia, and was on simvastatin and Vytorin in 2014.  She has been maintained on Crestor presents on Crestor 20 mg daily.  She is trying to follow a healthy diet.  She did lose from 190 lbs with her cardiac surgery and she is trying to keep that off.BMI  28.29.  Wt Readings from Last 3 Encounters:  11/30/19 180 lb 9.6 oz (81.9 kg)  11/26/19 179 lb (81.2 kg)  10/31/19 178 lb 8 oz (81 kg)   Lab Results  Component Value Date   CHOL 265 (H) 09/01/2018   CHOL 187 12/23/2006   CHOL 345 (HH) 09/08/2006   Lab Results  Component Value Date   HDL 54 09/01/2018   HDL 53.9 12/23/2006   HDL 78.2 09/08/2006   Lab Results  Component Value Date   LDLCALC  09/01/2018     Comment:     . LDL cholesterol not calculated. Triglyceride levels greater than 400 mg/dL invalidate calculated LDL results. . Reference range: <100 . Desirable range <100 mg/dL for primary prevention;   <70 mg/dL for patients with CHD or diabetic patients  with > or = 2 CHD risk factors. Marland Kitchen LDL-C is now calculated using the Martin-Hopkins  calculation, which is a validated novel method providing  better accuracy than the Friedewald equation in the  estimation of LDL-C.  Horald Pollen et al. Lenox Ahr. 9562;130(86): 2061-2068  (http://education.[facility].com/faq/FAQ164)    LDLCALC 111 (H) 12/23/2006   Lab Results  Component Value Date   TRIG 664 (H) 09/01/2018   TRIG 111 12/23/2006   TRIG 409 (HH) 09/08/2006   Lab Results  Component Value Date   CHOLHDL 4.9 09/01/2018   CHOLHDL 3.5 CALC 12/23/2006   CHOLHDL 7.3 CALC 09/08/2006   Lab Results  Component Value Date  LDLDIRECT 240.5 09/08/2006   DM2 on Glucophage 500 mg BID and Victoza 18 MG/3ML.She had glucoses in the 500's after her heart surgery and it is slowly getting back to normal. FBS today 152. She checks her sugar 2-3 times a day and noted improvement. She drinks plenty of fluid. Her appetite is improving  since she had her heart surgery and drinks Glucerna as needed.  Skips meals.  Cr 1.05 up from 0.85 and is being monitored by Cardiology. Diabetic foot exam with monofilament today is normal   HTN: Presents on metoprolol succinate 25 mg daily,  losartan 25 mg daily, furosemide 40 mg as needed.   Patient states she takes this daily because she will have slight ankle swelling and notices her weight goes up 2 pounds.  She takes furosemide and this resolves.  She reports her cardiologist did orthostatic vital signs on her last visit and it was normal.  She declines orthostatic vital signs today.  INSOMNIA: She is requesting refills on her Ambien CR 6.25 mg daily.  She started that 2020 and it works well for her.  She has had to take it every night since she had her heart surgery.  In the past she tried Benadryl and melatonin 10 mg and that did not work.  She does not follow sleep hygiene recommendations and does watch TV in bed.  She has never had testing for sleep apnea.   RLS: This is a chronic and unchanged problem for many years.  She has attributed this as a reason why she cannot sleep.  I had suggested a warm bath, avoidance of caffeine, she has used herbal medications, massage, and states she has tried everything.  Laboratory work was negative for iron deficiency anemia, normal magnesium level.  Patient presents today for complete physical.  Immunizations: TDap- 09/27/2014 Flu-09/01/2018 Pneumovax 09/27/2014 Prevnar (PCV13) ND HIV - declines HCV- will do today Shingrix- needs the second one.  COVID- she refuses this and "Don't go there with that vaccine. "  Diet: healthy diet Exercise:Trying to walk more, start to work on Mon  Colonoscopy: 07/2017 Dexa: not done, ankle fracture 10/2018 after fall, declines today Pap Smear: NA Mammogram: Remote, date unknown. Declines today stating she wants to wait for 3 mos as she is still  tender under right breast where she had drainage tubes.  Vision: UTD Dentist: UTD  Depression screen: PHQ-9 is 0, GAD is 0.  Past Medical History:  Diagnosis Date  . Diabetes mellitus    Previously diet-controlled  . Endometriosis   . Family history of adverse reaction to anesthesia    mother used to have vomiting after   . Hypercholesteremia   .  Hypertension   . Mitral Valve Prolapse w/ Mitral regurgitation    a. 10/2009 Echo: Mod MVP, mild MR; b. 07/2019 Echo: EF 60-65%, Gr2 DD, Mod MR; c. 07/2019 TEE: EF 60-65%. No rwma. Mod dil LA. Sev MR w/ sev bileaflet prolapse. Mild TR; d. 08/2019 s/p minimally invasive MV repair.  . Non-obstructive CAD (coronary artery disease)    a. 09/2006 Cath: LAD 30, RCA 25; b.07/2011 Cath: LM Ca2+, 30-55mLAD, RCA 30-40p, EF 55-65%, 2+ MR; c. 07/2019 Cath: LM 60m/d, LAD 64m, RI nl, LCX 4m, RCA 45p, 67m.  Marland Kitchen Post-op atrial fibrillation ([place])    a. 08/2019 following MV repair-->Amio/warfarin initiated.  Initially prolonged QT w/ IV amio-->converted to sinus.  . RLS (restless legs syndrome)   . S/P minimally invasive mitral valve repair 08/30/2019   a. 08/2019  s/p Complex valvuloplasty including artificial Goretex neochord placement x12 with plication of posterior leaflet and 40 mm Sorin Memo 4D ring annuloplasty via right mini thoracotomy approach.  Marland Kitchen UTI (urinary tract infection)     Past Surgical History:  Procedure Laterality Date  . ABDOMINAL HYSTERECTOMY     tah/bso  . APPENDECTOMY    . BUBBLE STUDY  08/09/2019   Procedure: BUBBLE STUDY;  Surgeon: Sande Rives, MD;  Location: [facility] ENDOSCOPY;  Service: Cardiology;;  . CHOLECYSTECTOMY    . KNEE SURGERY    . LAPAROSCOPY    . MITRAL VALVE REPAIR Right 08/30/2019   Procedure: MINIMALLY INVASIVE MITRAL VALVE REPAIR (MVR) USING MEMO 4D SIZE 40;  Surgeon: Purcell Nails, MD;  Location: [facility] OR;  Service: Open Heart Surgery;  Laterality: Right;  . RIGHT/LEFT HEART CATH AND CORONARY ANGIOGRAPHY N/A 08/09/2019   Procedure: RIGHT/LEFT HEART CATH AND CORONARY ANGIOGRAPHY;  Surgeon: Yvonne Kendall, MD;  Location: [facility] INVASIVE CV LAB;  Service: Cardiovascular;  Laterality: N/A;  . TEE WITHOUT CARDIOVERSION N/A 08/09/2019   Procedure: TRANSESOPHAGEAL ECHOCARDIOGRAM (TEE);  Surgeon: Sande Rives, MD;  Location[facility] ENDOSCOPY;  Service: Cardiology;   Laterality: N/A;  . TEE WITHOUT CARDIOVERSION N/A 08/30/2019   Procedure: TRANSESOPHAGEAL ECHOCARDIOGRAM (TEE);  Surgeon: Purcell Nails, MD;  Location: [facility] OR;  Service: Open Heart Surgery;  Laterality: N/A;  . US ECHOCARDIOGRAPHY     EF 55-60%    Family History  Problem Relation Age of Onset  . Diabetes Mother   . Breast cancer Mother 23  . Hypertension Father   . Ovarian cancer Neg Hx   . Colon cancer Neg Hx   . Heart disease Neg Hx     Social History   Socioeconomic History  . Marital status: Divorced    Spouse name: Not on file  . Number of children: Not on file  . Years of education: Not on file  . Highest education level: Not on file  Occupational History  . Not on file  Tobacco Use  . Smoking status: Former Smoker    Packs/day: 0.25    Years: 20.00    Pack years: 5.00    Types: Cigarettes    Quit date: 07/24/2015    Years since quitting: 4.3  . Smokeless tobacco: Never Used  Substance and Sexual Activity  . Alcohol use: Not Currently    Alcohol/week: 5.0 standard drinks    Types: 5 Standard drinks or equivalent per week  . Drug use: No  . Sexual activity: Not Currently    Birth control/protection: Surgical    Comment: Hysterectomy  Other Topics Concern  . Not on file  Social History Narrative  . Not on file   Social Determinants of Health   Financial Resource Strain:   . Difficulty of Paying Living Expenses:   Food Insecurity:   . Worried About Programme researcher, broadcasting/film/video in the Last Year:   . Barista in the Last Year:   Transportation Needs:   . Freight forwarder (Medical):   Marland Kitchen Lack of Transportation (Non-Medical):   Physical Activity:   . Days of Exercise per Week:   . Minutes of Exercise per Session:   Stress:   . Feeling of Stress :   Social Connections:   . Frequency of Communication with Friends and Family:   . Frequency of Social Gatherings with Friends and Family:   . Attends Religious Services:   . Active Member of Clubs or  Organizations:   .  Attends Banker Meetings:   Marland Kitchen Marital Status:   Intimate Partner Violence:   . Fear of Current or Ex-Partner:   . Emotionally Abused:   Marland Kitchen Physically Abused:   . Sexually Abused:     Outpatient Medications Prior to Visit  Medication Sig Dispense Refill  . aspirin EC 81 MG tablet Take 1 tablet (81 mg total) by mouth daily.    . furosemide (LASIX) 40 MG tablet Take 1 tablet (40 mg total) by mouth as needed for fluid or edema. 90 tablet 1  . glucose blood (COOL BLOOD GLUCOSE TEST STRIPS) test strip Use as instructed Use to test blood glucose daily. DX E11.9 100 each 0  . Insulin Pen Needle (PEN NEEDLES) 32G X 6 MM MISC 1 application by Does not apply route daily. DX E11.9 100 each 0  . losartan (COZAAR) 50 MG tablet Take 25 mg by mouth daily.    . metFORMIN (GLUCOPHAGE) 500 MG tablet Take 1 tablet (500 mg total) by mouth 2 (two) times daily with a meal. 180 tablet 3  . metoprolol succinate (TOPROL-XL) 25 MG 24 hr tablet TAKE 1 TABLET (25 MG TOTAL) BY MOUTH DAILY. PATIENT WILL CALL WHEN READY FOR REFILL. 30 tablet 1  . potassium chloride SA (KLOR-CON) 20 MEQ tablet Take 1 tablet (20 mEq total) by mouth daily. 30 tablet 3  . rosuvastatin (CRESTOR) 20 MG tablet Take 1 tablet (20 mg total) by mouth at bedtime.    Marland Kitchen VICTOZA 18 MG/3ML SOPN Inject 0.3 mLs (1.8 mg total) into the skin daily. 5 pen 3  . warfarin (COUMADIN) 5 MG tablet TAKE 1.5 TABLETS ON MONDAYS AND WEDNESDAYS. TAKE 1 TABLET ON ALL OTHER DAYS. AS DIRECTED BY THE COUMADIN CLINIC. 32 tablet 1  . zolpidem (AMBIEN CR) 6.25 MG CR tablet Take 1 tablet (6.25 mg total) by mouth at bedtime as needed for sleep. 30 tablet 0  . amiodarone (PACERONE) 200 MG tablet Take 200 mg by mouth daily.     No facility-administered medications prior to visit.    Allergies  Allergen Reactions  . Penicillins Other (See Comments)    Convulsions  Did it involve swelling of the face/tongue/throat, SOB, or low BP? No Did it  involve sudden or severe rash/hives, skin peeling, or any reaction on the inside of your mouth or nose? No Did you need to seek medical attention at a hospital or doctor's office? No When did it last happen?Childhood allergy If all above answers are "NO", may proceed with cephalosporin use.     ROS Review of Systems  Constitutional: Positive for appetite change and fatigue. Negative for chills and fever.  HENT: Negative for sore throat.   Respiratory: Negative for cough and shortness of breath.   Cardiovascular: Positive for chest pain and palpitations.       Twinge of chest pain every now and then. Rare palpitations, takes lasix for fluid a  little dizziness.   Gastrointestinal: Negative for abdominal pain, diarrhea, nausea and vomiting.  Genitourinary: Positive for frequency. Negative for dysuria.  Musculoskeletal: Negative.  Negative for arthralgias and back pain.  Skin: Negative for rash.  Allergic/Immunologic: Negative.   Neurological: Positive for dizziness.       Slight, fleeting,dizzy intermittent occurs before she had surgery- not often   Hematological: Negative for adenopathy. Does not bruise/bleed easily.  Psychiatric/Behavioral:       No concerns with depression/anxiety. No SI.       Objective:    Physical Exam  Constitutional: She is oriented to person, place, and time. She appears well-developed and well-nourished.  HENT:  Head: Normocephalic and atraumatic.  Eyes: Pupils are equal, round, and reactive to light. EOM are normal.  Cardiovascular: Normal rate and regular rhythm.  Pulmonary/Chest: Breath sounds normal. She has no wheezes. She has no rales. She exhibits no tenderness.  She has breathiness with activity. Pulse ox 99%. States she has been like this since her MV surgery and is slowly improving.   Abdominal: Soft. There is no abdominal tenderness.  Musculoskeletal:        General: No edema. Normal range of motion.     Cervical back: Normal range of  motion and neck supple.  Neurological: She is alert and oriented to person, place, and time.  Skin: Skin is warm and dry. No rash noted.  Healed MV surgical scars.   Psychiatric: She has a normal mood and affect. Her behavior is normal.    BP 118/62 (BP Location: Left Arm, Patient Position: Sitting, Cuff Size: Normal)   Pulse 90   Temp (!) 96.6 F (35.9 C) (Temporal)   Resp 15   Ht  (1.702 m)   Wt 180 lb 9.6 oz (81.9 kg)   LMP  (LMP Unknown)   SpO2 99%   BMI 28.29 kg/m  Wt Readings from Last 3 Encounters:  11/30/19 180 lb 9.6 oz (81.9 kg)  11/26/19 179 lb (81.2 kg)  10/31/19 178 lb 8 oz (81 kg)     Health Maintenance Due  Topic Date Due  . Hepatitis C Screening  Never done  . HIV Screening  Never done  . TETANUS/TDAP  Never done  . COLONOSCOPY  Never done    There are no preventive care reminders to display for this patient.  Lab Results  Component Value Date   TSH 1.540 10/19/2019   Lab Results  Component Value Date   WBC 7.2 10/19/2019   HGB 13.5 10/19/2019   HCT 40.7 10/19/2019   MCV 86 10/19/2019   PLT 226 10/19/2019   Lab Results  Component Value Date   NA 142 11/30/2019   K 3.5 11/30/2019   CO2 26 11/30/2019   GLUCOSE 143 (H) 11/30/2019   BUN 11 11/30/2019   CREATININE 1.06 (H) 11/30/2019   BILITOT 0.4 11/30/2019   ALKPHOS 107 10/19/2019   AST 33 11/30/2019   ALT 27 11/30/2019   PROT 7.5 11/30/2019   ALBUMIN 4.6 10/19/2019   CALCIUM 9.5 11/30/2019   ANIONGAP 14 10/24/2019   GFR 68.18 08/11/2011   Lab Results  Component Value Date   CHOL 265 (H) 09/01/2018   Lab Results  Component Value Date   HDL 54 09/01/2018   Lab Results  Component Value Date   LDLCALC  09/01/2018     Comment:     . LDL cholesterol not calculated. Triglyceride levels greater than 400 mg/dL invalidate calculated LDL results. . Reference range: <100 . Desirable range <100 mg/dL for primary prevention;   <70 mg/dL for patients with CHD or diabetic patients   with > or = 2 CHD risk factors. Marland Kitchen LDL-C is now calculated using the Martin-Hopkins  calculation, which is a validated novel method providing  better accuracy than the Friedewald equation in the  estimation of LDL-C.  Horald Pollen et al. Lenox Ahr. 1610;960(45): 2061-2068  (http://education.[facility].com/faq/FAQ164)    Lab Results  Component Value Date   TRIG 664 (H) 09/01/2018   Lab Results  Component Value Date   CHOLHDL 4.9 09/01/2018  Lab Results  Component Value Date   HGBA1C 8.2 (H) 11/30/2019      Assessment & Plan:   Problem List Items Addressed This Visit      Cardiovascular and Mediastinum   Essential hypertension   Relevant Orders   Basic Metabolic Panel (BMET) (Completed)   Acute on chronic diastolic heart failure due to valvular disease (HCC)     Endocrine   Type 2 diabetes mellitus with complication, without long-term current use of insulin (HCC)   Relevant Orders   HgB A1c (Completed)     Other   HLD (hyperlipidemia)   Relevant Orders   Lipid Profile   Urinary frequency   Relevant Orders   Urinalysis, Routine w reflex microscopic (Completed)   S/P minimally invasive mitral valve repair    Other Visit Diagnoses    Encounter for preventative adult health care examination    -  Primary   Elevated liver enzymes       Relevant Orders   Hepatitis C Antibody   Hepatic function panel (Completed)   Sleep disturbance       Relevant Orders   Ambulatory referral to Pulmonology     64 yo female presents today for preventative health care visit and to follow up of elevated liver enzymes and HLD on Crestor.  She has DM2, HTN,  non obstructive CAD, MVP with minimally invasive mitral valve surgery 08/30/2019 with post op course complicated by Afib with RVR. She came off  Amiodarone in March and will likely be taken off warfarin in near future if she remains in sinus rhythm per her cardiac surgeon. Post-op cardiac echo with decrease in her EF from 60% to 30-35%  and intact mitral valve. She is less weak, still SOB with minimal activity and having less dizzy spells. No falls. BP 102/68 at cardiology visit. Former smoker. Dr. Cornelius Moraswen has advised cardiac rehab. She is aware of need for good dental hygiene and lifelong antibiotic prophylaxis. Cardiology is following her.   Preventative health care: 1.  Needs mammogram: She cannot recall her last mammogram.  Patient declines mammogram at this time stating still having tenderness from her mitral valve surgery.  She wants to wait 3 months. 2.  She reports she had a colonoscopy, Lauren's note says she had one 07/2017 but I cannot find the report in Epic or Care everywhere.  She will check her records at home. 3.  Needs HIV screening- declines 4.  Due for her second Shingrix- she will get it at her pharmacy where she had the first vaccine 5.  Needs baseline DEXA and she declines. We discussed rationale, maybe later 6.  She is working on [employer]ria Grouphealthy diet and eating plan.  She would like to start walking more, but she still feeling dyspnea on exertion.  She is agreeable to starting cardiac rehab and I will place that order.  Her cardiologist has recommended this.   7. Will need Prevnar in the future. 8. Declines Covid vaccine and has no plans to get it in the future.   Elevated LFTS/Fatty liver: Repeat liver panel to see if her slight elevation with AST over ALT is still present. Hepatitis C screen, normal iron sat and  Ferritin . She is already on treatment for fatty liver which is getting control of her diabetes, hypertension, hyperlipidemia, weight loss, improving activity, avoidance of alcohol, remaining off smoking. Evaluate for sleep apnea.   INSOMNIA: She is requesting refills of her Ambien CR 6.25 mg daily.  She signed a  controlled substance contract today.  She was counseled regarding the medication.  We reviewed good sleep hygiene techniques such as routine bedtime 7 days a week, warm bath in the evening which may  also help her restless leg syndrome.  She is to avoid computer screens for 90 minutes before bed, stop watching TV  in bed, read a book, and try other relaxation techniques.  She has had a little anxiety since her heart surgery, she may benefit from cognitive behavioral therapy if this persists.  She tested negative for the GAD-7 in the PHQ-9 today. Placed referral for sleep apnea testing.   HLD: She remains on Crestor 20 mg daily.  We will recheck her lipid panel and if still not at goal, will need to advance therapy.   HTN: At goal, in fact I suspect her daily Lasix 40 mg is causing BP to be <110 systolic at times and contributing to elevated creatinine  and intermittent dizziness. She will be starting cardiac rehab and will need to hydrate. We discussed support hose/sockwell socks as they may help decrease her scant pedal edema so she does not fell the need to Lasix daily. She declined orthostatic vitals today. Will check urine Sp gravity.   DMT2: She was advised to check her FBS and bedtime 2-3 days in a row and write it down. She reports FBS variable. Skips meals. Counseled re proper management of diet with DM. Check labs today with A1c. Considering placing a referral to Chronic Care Management team in the future. Now, she will be busy with trying to go back to work and Cardiac Rehab.  Asked to please check fasting blood sugars before eating/drinking in the morning and at bedtime 3 times a week and record. Bring in to next office appt.   URINARY FREQUENCY: This is a minor positive on  ROS . No dysuria, hematuria, or bladder pain. Check UA today.   A total of 50 minutes of face to face time was spent with patient more than half of which was spent in counseling and coordination of care   Follow-up: Return in about 4 weeks (around 12/28/2019).   This visit occurred during the SARS-CoV-2 public health emergency.  Safety protocols were in place, including screening questions prior to the visit,  additional usage of staff PPE, and extensive cleaning of exam room while observing appropriate contact time as indicated for disinfecting solutions.    Amedeo Kinsman, NP

## 2019-11-30 NOTE — Patient Instructions (Signed)
It was nice to see you again today.   I have placed a referral in for Cardiac Rehab.   I have placed a referral in for sleep apnea testing.  Please go to the lab today.   Please check fasting blood sugars before you eat or drink in the morning and at bedtime 3 times a week and record. Bring in to your next office appt.   Follow up in 1 month please.

## 2019-12-01 LAB — URINALYSIS, ROUTINE W REFLEX MICROSCOPIC
Bacteria, UA: NONE SEEN /HPF
Bilirubin Urine: NEGATIVE
Glucose, UA: NEGATIVE
Ketones, ur: NEGATIVE
Nitrite: NEGATIVE
Protein, ur: NEGATIVE
Specific Gravity, Urine: 1.02 (ref 1.001–1.03)
pH: <=5 (ref 5.0–8.0)

## 2019-12-01 LAB — BASIC METABOLIC PANEL
BUN/Creatinine Ratio: 10 (calc) (ref 6–22)
BUN: 11 mg/dL (ref 7–25)
CO2: 26 mmol/L (ref 20–32)
Calcium: 9.5 mg/dL (ref 8.6–10.4)
Chloride: 99 mmol/L (ref 98–110)
Creat: 1.06 mg/dL — ABNORMAL HIGH (ref 0.50–0.99)
Glucose, Bld: 143 mg/dL — ABNORMAL HIGH (ref 65–99)
Potassium: 3.5 mmol/L (ref 3.5–5.3)
Sodium: 142 mmol/L (ref 135–146)

## 2019-12-01 LAB — HEPATIC FUNCTION PANEL
AG Ratio: 1.7 (calc) (ref 1.0–2.5)
ALT: 27 U/L (ref 6–29)
AST: 33 U/L (ref 10–35)
Albumin: 4.7 g/dL (ref 3.6–5.1)
Alkaline phosphatase (APISO): 72 U/L (ref 37–153)
Bilirubin, Direct: 0.1 mg/dL (ref 0.0–0.2)
Globulin: 2.8 g/dL (calc) (ref 1.9–3.7)
Indirect Bilirubin: 0.3 mg/dL (calc) (ref 0.2–1.2)
Total Bilirubin: 0.4 mg/dL (ref 0.2–1.2)
Total Protein: 7.5 g/dL (ref 6.1–8.1)

## 2019-12-01 LAB — HEMOGLOBIN A1C
Hgb A1c MFr Bld: 8.2 % of total Hgb — ABNORMAL HIGH (ref <5.7)
Mean Plasma Glucose: 189 (calc)
eAG (mmol/L): 10.4 (calc)

## 2019-12-02 ENCOUNTER — Encounter: Payer: Self-pay | Admitting: Nurse Practitioner

## 2019-12-02 LAB — LIPID PANEL
Cholesterol: 192 mg/dL (ref <200)
HDL: 58 mg/dL (ref >=50)
LDL Cholesterol (Calc): 91 mg/dL (calc)
Non-HDL Cholesterol (Calc): 134 mg/dL (calc) — ABNORMAL HIGH (ref <130)
Total CHOL/HDL Ratio: 3.3 (calc) (ref <5.0)
Triglycerides: 326 mg/dL — ABNORMAL HIGH (ref <150)

## 2019-12-03 LAB — HEPATITIS C ANTIBODY
Hepatitis C Ab: NONREACTIVE
SIGNAL TO CUT-OFF: 0.01 (ref <1.00)

## 2019-12-04 ENCOUNTER — Encounter: Payer: Self-pay | Admitting: *Deleted

## 2019-12-04 ENCOUNTER — Other Ambulatory Visit: Payer: Self-pay

## 2019-12-04 ENCOUNTER — Telehealth: Payer: Self-pay | Admitting: Nurse Practitioner

## 2019-12-04 ENCOUNTER — Telehealth: Payer: Self-pay

## 2019-12-04 ENCOUNTER — Encounter: Payer: Managed Care, Other (non HMO) | Attending: Internal Medicine | Admitting: *Deleted

## 2019-12-04 DIAGNOSIS — G4700 Insomnia, unspecified: Secondary | ICD-10-CM

## 2019-12-04 DIAGNOSIS — Z9889 Other specified postprocedural states: Secondary | ICD-10-CM | POA: Insufficient documentation

## 2019-12-04 MED ORDER — ZOLPIDEM TARTRATE ER 6.25 MG PO TBCR: 6.2500 mg | EXTENDED_RELEASE_TABLET | Freq: Every evening | ORAL | 0 refills | Status: DC | PRN

## 2019-12-04 NOTE — Telephone Encounter (Signed)
I re-ordered her Ambien CR 6.25 mg #30 no refills. She has a sleep consult with [facility] arranged for next month.

## 2019-12-04 NOTE — Telephone Encounter (Signed)
Refill request for Bonnie Ryan, last seen 11-30-19, last filled 10-31-19.  Please advise.

## 2019-12-04 NOTE — Progress Notes (Signed)
Completed virtual orientation today.  EP evaluation is scheduled for Wednesday 4/14 at 1130am.  Documentation for diagnosis can be found in [facility] encounter 08/30/19.

## 2019-12-05 ENCOUNTER — Other Ambulatory Visit: Payer: Managed Care, Other (non HMO)

## 2019-12-05 ENCOUNTER — Telehealth: Payer: Self-pay | Admitting: Nurse Practitioner

## 2019-12-05 ENCOUNTER — Telehealth: Payer: Self-pay

## 2019-12-05 VITALS — Ht 67.0 in | Wt 182.9 lb

## 2019-12-05 DIAGNOSIS — Z9889 Other specified postprocedural states: Secondary | ICD-10-CM

## 2019-12-05 DIAGNOSIS — R300 Dysuria: Secondary | ICD-10-CM

## 2019-12-05 DIAGNOSIS — R748 Abnormal levels of other serum enzymes: Secondary | ICD-10-CM

## 2019-12-05 DIAGNOSIS — K760 Fatty (change of) liver, not elsewhere classified: Secondary | ICD-10-CM

## 2019-12-05 LAB — URINALYSIS, MICROSCOPIC ONLY: RBC / HPF: NONE SEEN (ref 0–?)

## 2019-12-05 LAB — POCT URINALYSIS DIPSTICK
Bilirubin, UA: NEGATIVE
Blood, UA: NEGATIVE
Glucose, UA: NEGATIVE
Ketones, UA: NEGATIVE
Leukocytes, UA: NEGATIVE
Nitrite, UA: NEGATIVE
Protein, UA: NEGATIVE
Spec Grav, UA: 1.015 (ref 1.010–1.025)
Urobilinogen, UA: 0.2 E.U./dL
pH, UA: 5.5 (ref 5.0–8.0)

## 2019-12-05 NOTE — Progress Notes (Signed)
Cardiac Individual Treatment Plan  Patient Details  Name: Bonnie Ryan MRN: [id number] Date of Birth: 05-10-1956 Referring Provider:     Cardiac Rehab from 12/05/2019 in [facility] Cardiac and Pulmonary Rehab  Referring Provider  End      Initial Encounter Date:    Cardiac Rehab from 12/05/2019 in [facility] Cardiac and Pulmonary Rehab  Date  12/05/19      Visit Diagnosis: S/P minimally invasive mitral valve repair  Patient's Home Medications on Admission:  Current Outpatient Medications:  .  aspirin EC 81 MG tablet, Take 1 tablet (81 mg total) by mouth daily., Disp:  , Rfl:  .  furosemide (LASIX) 40 MG tablet, Take 1 tablet (40 mg total) by mouth as needed for fluid or edema., Disp: 90 tablet, Rfl: 1 .  glucose blood (COOL BLOOD GLUCOSE TEST STRIPS) test strip, Use as instructed Use to test blood glucose daily. DX E11.9, Disp: 100 each, Rfl: 0 .  Insulin Pen Needle (PEN NEEDLES) 32G X 6 MM MISC, 1 application by Does not apply route daily. DX E11.9, Disp: 100 each, Rfl: 0 .  losartan (COZAAR) 50 MG tablet, Take 25 mg by mouth daily., Disp: , Rfl:  .  metFORMIN (GLUCOPHAGE) 500 MG tablet, Take 1 tablet (500 mg total) by mouth 2 (two) times daily with a meal., Disp: 180 tablet, Rfl: 3 .  metoprolol succinate (TOPROL-XL) 25 MG 24 hr tablet, TAKE 1 TABLET (25 MG TOTAL) BY MOUTH DAILY. PATIENT WILL CALL WHEN READY FOR REFILL., Disp: 30 tablet, Rfl: 1 .  potassium chloride SA (KLOR-CON) 20 MEQ tablet, Take 1 tablet (20 mEq total) by mouth daily., Disp: 30 tablet, Rfl: 3 .  rosuvastatin (CRESTOR) 20 MG tablet, Take 1 tablet (20 mg total) by mouth at bedtime., Disp: , Rfl:  .  VICTOZA 18 MG/3ML SOPN, Inject 0.3 mLs (1.8 mg total) into the skin daily., Disp: 5 pen, Rfl: 3 .  warfarin (COUMADIN) 5 MG tablet, TAKE 1.5 TABLETS ON MONDAYS AND WEDNESDAYS. TAKE 1 TABLET ON ALL OTHER DAYS. AS DIRECTED BY THE COUMADIN CLINIC., Disp: 32 tablet, Rfl: 1 .  zolpidem (AMBIEN CR) 6.25 MG CR tablet, Take 1 tablet  (6.25 mg total) by mouth at bedtime as needed for sleep., Disp: 30 tablet, Rfl: 0  Past Medical History: Past Medical History:  Diagnosis Date  . Diabetes mellitus    Previously diet-controlled  . Endometriosis   . Family history of adverse reaction to anesthesia    mother used to have vomiting after   . Hypercholesteremia   . Hypertension   . Mitral Valve Prolapse w/ Mitral regurgitation    a. 10/2009 Echo: Mod MVP, mild MR; b. 07/2019 Echo: EF 60-65%, Gr2 DD, Mod MR; c. 07/2019 TEE: EF 60-65%. No rwma. Mod dil LA. Sev MR w/ sev bileaflet prolapse. Mild TR; d. 08/2019 s/p minimally invasive MV repair.  . Non-obstructive CAD (coronary artery disease)    a. 09/2006 Cath: LAD 30, RCA 25; b.07/2011 Cath: LM Ca2+, 30-58mAD, RCA 30-40p, EF 55-65%, 2+ MR; c. 07/2019 Cath: LM 112m, LAD 502mI nl, LCX 21m59mA 45p, 39m.21mPosMarland Kitchen-op atrial fibrillation (HCC) Lititza. 08/2019 following MV repair-->Amio/warfarin initiated.  Initially prolonged QT w/ IV amio-->converted to sinus.  . RLS (restless legs syndrome)   . S/P minimally invasive mitral valve repair 08/30/2019   a. 08/2019 s/p Complex valvuloplasty including artificial Goretex neochord placement x12 wZ12 plication of posterior leaflet and 40 mm Sorin Memo 4D ring annuloplasty via  right mini thoracotomy approach.  Marland Kitchen UTI (urinary tract infection)     Tobacco Use: Social History   Tobacco Use  Smoking Status Former Smoker  . Packs/day: 0.25  . Years: 20.00  . Pack years: 5.00  . Types: Cigarettes  . Quit date: 07/24/2015  . Years since quitting: 4.3  Smokeless Tobacco Never Used    Labs: Recent Review Scientist, physiological    Labs for ITP Cardiac and Pulmonary Rehab Latest Ref Rng & Units 08/30/2019 08/30/2019 08/30/2019 08/30/2019 11/30/2019   Cholestrol <200 mg/dL - - - - 192   LDLCALC mg/dL (calc) - - - - 91   LDLDIRECT mg/dL - - - - -   HDL > OR = 50 mg/dL - - - - 58   Trlycerides <150 mg/dL - - - - 326(H)   Hemoglobin A1c <5.7 % of total Hgb - -  - - 8.2(H)   PHART 7.350 - 7.450 7.297(L) - 7.362 7.354 -   PCO2ART 32.0 - 48.0 mmHg 43.5 - 39.0 41.7 -   HCO3 20.0 - 28.0 mmol/L 21.2 - 22.1 23.4 -   TCO2 22 - 32 mmol/L '23 23 23 25 ' -   ACIDBASEDEF 0.0 - 2.0 mmol/L 5.0(H) - 3.0(H) 2.0 -   O2SAT % 98.0 - 100.0 97.0 -       Exercise Target Goals: Exercise Program Goal: Individual exercise prescription set using results from initial 6 min walk test and THRR while considering  patient's activity barriers and safety.   Exercise Prescription Goal: Initial exercise prescription builds to 30-45 minutes a day of aerobic activity, 2-3 days per week.  Home exercise guidelines will be given to patient during program as part of exercise prescription that the participant will acknowledge.   Education: Aerobic Exercise & Resistance Training: - Gives group verbal and written instruction on the various components of exercise. Focuses on aerobic and resistive training programs and the benefits of this training and how to safely progress through these programs..   Education: Exercise & Equipment Safety: - Individual verbal instruction and demonstration of equipment use and safety with use of the equipment.   Cardiac Rehab from 12/05/2019 in [facility] Cardiac and Pulmonary Rehab  Date  12/05/19  Educator  AS  Instruction Review Code  1- Verbalizes Understanding      Education: Exercise Physiology & General Exercise Guidelines: - Group verbal and written instruction with models to review the exercise physiology of the cardiovascular system and associated critical values. Provides general exercise guidelines with specific guidelines to those with heart or lung disease.    Education: Flexibility, Balance, Mind/Body Relaxation: Provides group verbal/written instruction on the benefits of flexibility and balance training, including mind/body exercise modes such as yoga, pilates and tai chi.  Demonstration and skill practice provided.   Activity Barriers &  Risk Stratification: Activity Barriers & Cardiac Risk Stratification - 12/04/19 1410      Activity Barriers & Cardiac Risk Stratification   Activity Barriers  Deconditioning;Joint Problems;Muscular Weakness;Other (comment);Shortness of Breath;History of Falls    Comments  occasional knee pain (bilateral surgical history), broke ankles last sping    Cardiac Risk Stratification  Low       6 Minute Walk: 6 Minute Walk    Row Name 12/05/19 1247         6 Minute Walk   Phase  Initial     Distance  1315 feet     Walk Time  6 minutes     # of Rest Breaks  0     MPH  2.5     METS  3.13     RPE  10     Perceived Dyspnea   1     Symptoms  No     Resting HR  87 bpm     Resting BP  124/78     Resting Oxygen Saturation   96 %     Exercise Oxygen Saturation  during 6 min walk  98 %     Max Ex. HR  99 bpm     Max Ex. BP  124/74     2 Minute Post BP  110/74        Oxygen Initial Assessment:   Oxygen Re-Evaluation:   Oxygen Discharge (Final Oxygen Re-Evaluation):   Initial Exercise Prescription: Initial Exercise Prescription - 12/05/19 1200      Date of Initial Exercise RX and Referring Provider   Date  12/05/19    Referring Provider  End      Treadmill   MPH  2.5    Grade  0.5    Minutes  15    METs  3.09      NuStep   Level  3    SPM  80    Minutes  15    METs  3      Elliptical   Level  1    Speed  2.5    Minutes  15      T5 Nustep   Level  1    SPM  80    Minutes  15    METs  3      Prescription Details   Frequency (times per week)  3    Duration  Progress to 30 minutes of continuous aerobic without signs/symptoms of physical distress      Intensity   THRR 40-80% of Max Heartrate  115-143    Ratings of Perceived Exertion  11-15    Perceived Dyspnea  0-4      Resistance Training   Training Prescription  Yes    Weight  3 lb    Reps  10-15       Perform Capillary Blood Glucose checks as needed.  Exercise Prescription Changes: Exercise  Prescription Changes    Row Name 12/05/19 1200             Response to Exercise   Blood Pressure (Admit)  124/78       Blood Pressure (Exercise)  124/74       Blood Pressure (Exit)  110/74       Heart Rate (Admit)  87 bpm       Heart Rate (Exercise)  99 bpm       Heart Rate (Exit)  96 bpm       Oxygen Saturation (Admit)  96 %       Oxygen Saturation (Exercise)  98 %       Rating of Perceived Exertion (Exercise)  10       Perceived Dyspnea (Exercise)  1       Symptoms  none          Exercise Comments:   Exercise Goals and Review: Exercise Goals    Row Name 12/05/19 1251             Exercise Goals   Increase Physical Activity  Yes       Intervention  Provide advice, education, support and counseling about physical activity/exercise needs.;Develop an individualized exercise  prescription for aerobic and resistive training based on initial evaluation findings, risk stratification, comorbidities and participant's personal goals.       Expected Outcomes  Short Term: Attend rehab on a regular basis to increase amount of physical activity.;Long Term: Add in home exercise to make exercise part of routine and to increase amount of physical activity.;Long Term: Exercising regularly at least 3-5 days a week.       Increase Strength and Stamina  Yes       Intervention  Provide advice, education, support and counseling about physical activity/exercise needs.;Develop an individualized exercise prescription for aerobic and resistive training based on initial evaluation findings, risk stratification, comorbidities and participant's personal goals.       Expected Outcomes  Short Term: Increase workloads from initial exercise prescription for resistance, speed, and METs.;Short Term: Perform resistance training exercises routinely during rehab and add in resistance training at home;Long Term: Improve cardiorespiratory fitness, muscular endurance and strength as measured by increased METs and  functional capacity (6MWT)       Able to understand and use rate of perceived exertion (RPE) scale  Yes       Intervention  Provide education and explanation on how to use RPE scale       Expected Outcomes  Short Term: Able to use RPE daily in rehab to express subjective intensity level;Long Term:  Able to use RPE to guide intensity level when exercising independently       Able to understand and use Dyspnea scale  Yes       Intervention  Provide education and explanation on how to use Dyspnea scale       Expected Outcomes  Short Term: Able to use Dyspnea scale daily in rehab to express subjective sense of shortness of breath during exertion;Long Term: Able to use Dyspnea scale to guide intensity level when exercising independently       Knowledge and understanding of Target Heart Rate Range (THRR)  Yes       Intervention  Provide education and explanation of THRR including how the numbers were predicted and where they are located for reference       Expected Outcomes  Short Term: Able to state/look up THRR;Short Term: Able to use daily as guideline for intensity in rehab;Long Term: Able to use THRR to govern intensity when exercising independently       Able to check pulse independently  Yes       Intervention  Provide education and demonstration on how to check pulse in carotid and radial arteries.;Review the importance of being able to check your own pulse for safety during independent exercise       Expected Outcomes  Short Term: Able to explain why pulse checking is important during independent exercise;Long Term: Able to check pulse independently and accurately       Understanding of Exercise Prescription  Yes       Intervention  Provide education, explanation, and written materials on patient's individual exercise prescription       Expected Outcomes  Short Term: Able to explain program exercise prescription;Long Term: Able to explain home exercise prescription to exercise independently            Exercise Goals Re-Evaluation :   Discharge Exercise Prescription (Final Exercise Prescription Changes): Exercise Prescription Changes - 12/05/19 1200      Response to Exercise   Blood Pressure (Admit)  124/78    Blood Pressure (Exercise)  124/74    Blood Pressure (  Exit)  110/74    Heart Rate (Admit)  87 bpm    Heart Rate (Exercise)  99 bpm    Heart Rate (Exit)  96 bpm    Oxygen Saturation (Admit)  96 %    Oxygen Saturation (Exercise)  98 %    Rating of Perceived Exertion (Exercise)  10    Perceived Dyspnea (Exercise)  1    Symptoms  none       Nutrition:  Target Goals: Understanding of nutrition guidelines, daily intake of sodium <1548m, cholesterol <2034m calories 30% from fat and 7% or less from saturated fats, daily to have 5 or more servings of fruits and vegetables.  Education: Controlling Sodium/Reading Food Labels -Group verbal and written material supporting the discussion of sodium use in heart healthy nutrition. Review and explanation with models, verbal and written materials for utilization of the food label.   Education: General Nutrition Guidelines/Fats and Fiber: -Group instruction provided by verbal, written material, models and posters to present the general guidelines for heart healthy nutrition. Gives an explanation and review of dietary fats and fiber.   Biometrics: Pre Biometrics - 12/05/19 1252      Pre Biometrics   Height  '5\' 7"'  (1.702 m)    Weight  182 lb 14.4 oz (83 kg)    BMI (Calculated)  28.64    Single Leg Stand  17.72 seconds        Nutrition Therapy Plan and Nutrition Goals:   Nutrition Assessments: Nutrition Assessments - 12/05/19 1256      MEDFICTS Scores   Pre Score  3       MEDIFICTS Score Key:          ?70 Need to make dietary changes          40-70 Heart Healthy Diet         ? 40 Therapeutic Level Cholesterol Diet  Nutrition Goals Re-Evaluation:   Nutrition Goals Discharge (Final Nutrition Goals  Re-Evaluation):   Psychosocial: Target Goals: Acknowledge presence or absence of significant depression and/or stress, maximize coping skills, provide positive support system. Participant is able to verbalize types and ability to use techniques and skills needed for reducing stress and depression.   Education: Depression - Provides group verbal and written instruction on the correlation between heart/lung disease and depressed mood, treatment options, and the stigmas associated with seeking treatment.   Education: Sleep Hygiene -Provides group verbal and written instruction about how sleep can affect your health.  Define sleep hygiene, discuss sleep cycles and impact of sleep habits. Review good sleep hygiene tips.     Education: Stress and Anxiety: - Provides group verbal and written instruction about the health risks of elevated stress and causes of high stress.  Discuss the correlation between heart/lung disease and anxiety and treatment options. Review healthy ways to manage with stress and anxiety.    Initial Review & Psychosocial Screening: Initial Psych Review & Screening - 12/04/19 1412      Initial Review   Current issues with  Current Sleep Concerns;Current Stress Concerns    Source of Stress Concerns  Chronic Illness;Unable to perform yard/household activities;Unable to participate in former interests or hobbies    Comments  uses ambien for sleep 4-5 hours a night, recovering from surgery      FaTopaz Yes   son and his wife (nurse practition), mother all live close by     Barriers   Psychosocial barriers to participate in  program  The patient should benefit from training in stress management and relaxation.;There are no identifiable barriers or psychosocial needs.      Screening Interventions   Interventions  Encouraged to exercise;Provide feedback about the scores to participant;To provide support and resources with identified  psychosocial needs    Expected Outcomes  Long Term Goal: Stressors or current issues are controlled or eliminated.;Short Term goal: Utilizing psychosocial counselor, staff and physician to assist with identification of specific Stressors or current issues interfering with healing process. Setting desired goal for each stressor or current issue identified.;Short Term goal: Identification and review with participant of any Quality of Life or Depression concerns found by scoring the questionnaire.;Long Term goal: The participant improves quality of Life and PHQ9 Scores as seen by post scores and/or verbalization of changes       Quality of Life Scores:  Quality of Life - 12/05/19 1259      Quality of Life   Select  Quality of Life      Quality of Life Scores   Health/Function Pre  26.14 %    Socioeconomic Pre  27.43 %    Psych/Spiritual Pre  25.71 %    Family Pre  30 %    GLOBAL Pre  26.81 %      Scores of 19 and below usually indicate a poorer quality of life in these areas.  A difference of  2-3 points is a clinically meaningful difference.  A difference of 2-3 points in the total score of the Quality of Life Index has been associated with significant improvement in overall quality of life, self-image, physical symptoms, and general health in studies assessing change in quality of life.  PHQ-9: Recent Review Flowsheet Data    Depression screen [facility] 2/9 12/05/2019 10/22/2019 02/27/2019   Decreased Interest 0 0 0   Down, Depressed, Hopeless 0 0 0   PHQ - 2 Score 0 0 0   Altered sleeping 3  0 0   Tired, decreased energy 1 0 0   Change in appetite 3  0 0   Feeling bad or failure about yourself  0 0 0   Trouble concentrating 0 0 0   Moving slowly or fidgety/restless 0 0 0   Suicidal thoughts 0 0 0   PHQ-9 Score 7 0 0   Difficult doing work/chores - Not difficult at all Not difficult at all     Interpretation of Total Score  Total Score Depression Severity:  1-4 = Minimal depression, 5-9 =  Mild depression, 10-14 = Moderate depression, 15-19 = Moderately severe depression, 20-27 = Severe depression   Psychosocial Evaluation and Intervention: Psychosocial Evaluation - 12/04/19 1415      Psychosocial Evaluation & Interventions   Interventions  Encouraged to exercise with the program and follow exercise prescription    Comments  Esparanza is coming  to cardiac rehab after a mitral valve repair.  She has a great support system in her family that lives near by.  She is looking forward to rehab to get herself back.  She has been dealing with a leaky valve since her 15s and has always gotten by up until last year when her symptoms started to interfere with her life.  She gave up going to the gym after work with friends as she just was not able to go anymore.  She really wants to get her stamina back and be able to go again.  She also wants to make sure her heart is  back in working order.    Expected Outcomes  Short: Attend rehab to get back to exercise Long: Build stamina back up to be able to go again.    Continue Psychosocial Services   Follow up required by staff       Psychosocial Re-Evaluation:   Psychosocial Discharge (Final Psychosocial Re-Evaluation):   Vocational Rehabilitation: Provide vocational rehab assistance to qualifying candidates.   Vocational Rehab Evaluation & Intervention: Vocational Rehab - 12/04/19 1412      Initial Vocational Rehab Evaluation & Intervention   Assessment shows need for Vocational Rehabilitation  Yes   still working      Education: Education Goals: Education classes will be provided on a variety of topics geared toward better understanding of heart health and risk factor modification. Participant will state understanding/return demonstration of topics presented as noted by education test scores.  Learning Barriers/Preferences: Learning Barriers/Preferences - 12/04/19 1411      Learning Barriers/Preferences   Learning Barriers  Sight    glasses (needs new Rx)   Learning Preferences  Skilled Demonstration;Verbal Instruction       General Cardiac Education Topics:  AED/CPR: - Group verbal and written instruction with the use of models to demonstrate the basic use of the AED with the basic ABC's of resuscitation.   Anatomy & Physiology of the Heart: - Group verbal and written instruction and models provide basic cardiac anatomy and physiology, with the coronary electrical and arterial systems. Review of Valvular disease and Heart Failure   Cardiac Procedures: - Group verbal and written instruction to review commonly prescribed medications for heart disease. Reviews the medication, class of the drug, and side effects. Includes the steps to properly store meds and maintain the prescription regimen. (beta blockers and nitrates)   Cardiac Medications I: - Group verbal and written instruction to review commonly prescribed medications for heart disease. Reviews the medication, class of the drug, and side effects. Includes the steps to properly store meds and maintain the prescription regimen.   Cardiac Medications II: -Group verbal and written instruction to review commonly prescribed medications for heart disease. Reviews the medication, class of the drug, and side effects. (all other drug classes)    Go Sex-Intimacy & Heart Disease, Get SMART - Goal Setting: - Group verbal and written instruction through game format to discuss heart disease and the return to sexual intimacy. Provides group verbal and written material to discuss and apply goal setting through the application of the S.M.A.R.T. Method.   Other Matters of the Heart: - Provides group verbal, written materials and models to describe Stable Angina and Peripheral Artery. Includes description of the disease process and treatment options available to the cardiac patient.   Infection Prevention: - Provides verbal and written material to individual with  discussion of infection control including proper hand washing and proper equipment cleaning during exercise session.   Cardiac Rehab from 12/05/2019 in [facility] Cardiac and Pulmonary Rehab  Date  12/05/19  Educator  AS  Instruction Review Code  1- Verbalizes Understanding      Falls Prevention: - Provides verbal and written material to individual with discussion of falls prevention and safety.   Cardiac Rehab from 12/05/2019 in [facility] Cardiac and Pulmonary Rehab  Date  12/05/19  Educator  AS  Instruction Review Code  1- Verbalizes Understanding      Other: -Provides group and verbal instruction on various topics (see comments)   Knowledge Questionnaire Score: Knowledge Questionnaire Score - 12/05/19 1258  Knowledge Questionnaire Score   Pre Score  25/26       Core Components/Risk Factors/Patient Goals at Admission: Personal Goals and Risk Factors at Admission - 12/05/19 1253      Core Components/Risk Factors/Patient Goals on Admission    Weight Management  Yes;Weight Loss;Obesity    Intervention  Weight Management: Develop a combined nutrition and exercise program designed to reach desired caloric intake, while maintaining appropriate intake of nutrient and fiber, sodium and fats, and appropriate energy expenditure required for the weight goal.;Weight Management: Provide education and appropriate resources to help participant work on and attain dietary goals.;Obesity: Provide education and appropriate resources to help participant work on and attain dietary goals.;Weight Management/Obesity: Establish reasonable short term and long term weight goals.    Admit Weight  182 lb 14.4 oz (83 kg)    Goal Weight: Short Term  172 lb (78 kg)    Goal Weight: Long Term  162 lb (73.5 kg)    Expected Outcomes  Short Term: Continue to assess and modify interventions until short term weight is achieved;Long Term: Adherence to nutrition and physical activity/exercise program aimed toward attainment  of established weight goal;Weight Loss: Understanding of general recommendations for a balanced deficit meal plan, which promotes 1-2 lb weight loss per week and includes a negative energy balance of [phone] kcal/d;Understanding recommendations for meals to include 15-35% energy as protein, 25-35% energy from fat, 35-60% energy from carbohydrates, less than 246m of dietary cholesterol, 20-35 gm of total fiber daily;Understanding of distribution of calorie intake throughout the day with the consumption of 4-5 meals/snacks    Intervention  Provide education about signs/symptoms and action to take for hypo/hyperglycemia.;Provide education about proper nutrition, including hydration, and aerobic/resistive exercise prescription along with prescribed medications to achieve blood glucose in normal ranges: Fasting glucose 65-99 mg/dL    Expected Outcomes  Short Term: Participant verbalizes understanding of the signs/symptoms and immediate care of hyper/hypoglycemia, proper foot care and importance of medication, aerobic/resistive exercise and nutrition plan for blood glucose control.;Long Term: Attainment of HbA1C < 7%.    Intervention  Provide education on lifestyle modifcations including regular physical activity/exercise, weight management, moderate sodium restriction and increased consumption of fresh fruit, vegetables, and low fat dairy, alcohol moderation, and smoking cessation.;Monitor prescription use compliance.    Expected Outcomes  Short Term: Continued assessment and intervention until BP is < 140/930mHG in hypertensive participants. < 130/8060mG in hypertensive participants with diabetes, heart failure or chronic kidney disease.;Long Term: Maintenance of blood pressure at goal levels.    Intervention  Provide education and support for participant on nutrition & aerobic/resistive exercise along with prescribed medications to achieve LDL <61m32mDL >40mg50m Expected Outcomes  Short Term: Participant  states understanding of desired cholesterol values and is compliant with medications prescribed. Participant is following exercise prescription and nutrition guidelines.;Long Term: Cholesterol controlled with medications as prescribed, with individualized exercise RX and with personalized nutrition plan. Value goals: LDL < 61mg,12m > 40 mg.       Education:Diabetes - Individual verbal and written instruction to review signs/symptoms of diabetes, desired ranges of glucose level fasting, after meals and with exercise. Acknowledge that pre and post exercise glucose checks will be done for 3 sessions at entry of program.   Education: Know Your Numbers and Risk Factors: -Group verbal and written instruction about important numbers in your health.  Discussion of what are risk factors and how they play a role in the disease process.  Review of Cholesterol, Blood Pressure, Diabetes, and BMI and the role they play in your overall health.   Core Components/Risk Factors/Patient Goals Review:    Core Components/Risk Factors/Patient Goals at Discharge (Final Review):    ITP Comments: ITP Comments    Row Name 12/04/19 1439           ITP Comments  Completed virtual orientation today.  EP evaluation is scheduled for Wednesday 4/14 at 1130am.  Documentation for diagnosis can be found in [facility] encounter 08/30/19.          Comments: initial ITP

## 2019-12-05 NOTE — Telephone Encounter (Signed)
Rx has been faxed per patient.

## 2019-12-05 NOTE — Telephone Encounter (Signed)
Her lipid panel is not at goal.  As she has hepatic steatosis. She has had waxing and waning mild elevation in AST, ALT.  Recent liver panel was normal.  I am going to need to increase her Crestor dose.  I would like to confirm that she has no other underlying liver disorder other than fatty liver.  There is no alcohol in use.   Plan: We will add additional lab work today to evaluate for hepatitis a and B, autoimmune disorders, Wilson's, alpha 1 antitrypsin with phenotype.

## 2019-12-05 NOTE — Telephone Encounter (Signed)
Urine orders placed future

## 2019-12-05 NOTE — Addendum Note (Signed)
Addended by: Warden Fillers on: 12/05/2019 01:07 PM   Modules accepted: Orders

## 2019-12-05 NOTE — Addendum Note (Signed)
Addended by: Warden Fillers on: 12/05/2019 12:59 PM   Modules accepted: Orders

## 2019-12-05 NOTE — Patient Instructions (Signed)
Patient Instructions  Patient Details  Name: Bonnie Ryan MRN: [id number] Date of Birth: 07-03-1956 Referring Provider:  Yvonne Kendall, MD  Below are your personal goals for exercise, nutrition, and risk factors. Our goal is to help you stay on track towards obtaining and maintaining these goals. We will be discussing your progress on these goals with you throughout the program.  Initial Exercise Prescription: Initial Exercise Prescription - 12/05/19 1200      Date of Initial Exercise RX and Referring Provider   Date  12/05/19    Referring Provider  End      Treadmill   MPH  2.5    Grade  0.5    Minutes  15    METs  3.09      NuStep   Level  3    SPM  80    Minutes  15    METs  3      Elliptical   Level  1    Speed  2.5    Minutes  15      T5 Nustep   Level  1    SPM  80    Minutes  15    METs  3      Prescription Details   Frequency (times per week)  3    Duration  Progress to 30 minutes of continuous aerobic without signs/symptoms of physical distress      Intensity   THRR 40-80% of Max Heartrate  115-143    Ratings of Perceived Exertion  11-15    Perceived Dyspnea  0-4      Resistance Training   Training Prescription  Yes    Weight  3 lb    Reps  10-15       Exercise Goals: Frequency: Be able to perform aerobic exercise two to three times per week in program working toward 2-5 days per week of home exercise.  Intensity: Work with a perceived exertion of 11 (fairly light) - 15 (hard) while following your exercise prescription.  We will make changes to your prescription with you as you progress through the program.   Duration: Be able to do 30 to 45 minutes of continuous aerobic exercise in addition to a 5 minute warm-up and a 5 minute cool-down routine.   Nutrition Goals: Your personal nutrition goals will be established when you do your nutrition analysis with the dietician.  The following are general nutrition guidelines to  follow: Cholesterol < 200mg /day Sodium < 1500mg /day Fiber: Women over 50 yrs - 21 grams per day  Personal Goals: Personal Goals and Risk Factors at Admission - 12/05/19 1253      Core Components/Risk Factors/Patient Goals on Admission    Weight Management  Yes;Weight Loss;Obesity    Intervention  Weight Management: Develop a combined nutrition and exercise program designed to reach desired caloric intake, while maintaining appropriate intake of nutrient and fiber, sodium and fats, and appropriate energy expenditure required for the weight goal.;Weight Management: Provide education and appropriate resources to help participant work on and attain dietary goals.;Obesity: Provide education and appropriate resources to help participant work on and attain dietary goals.;Weight Management/Obesity: Establish reasonable short term and long term weight goals.    Admit Weight  182 lb 14.4 oz (83 kg)    Goal Weight: Short Term  172 lb (78 kg)    Goal Weight: Long Term  162 lb (73.5 kg)    Expected Outcomes  Short Term: Continue to assess and modify interventions until  short term weight is achieved;Long Term: Adherence to nutrition and physical activity/exercise program aimed toward attainment of established weight goal;Weight Loss: Understanding of general recommendations for a balanced deficit meal plan, which promotes 1-2 lb weight loss per week and includes a negative energy balance of [phone] kcal/d;Understanding recommendations for meals to include 15-35% energy as protein, 25-35% energy from fat, 35-60% energy from carbohydrates, less than 200mg  of dietary cholesterol, 20-35 gm of total fiber daily;Understanding of distribution of calorie intake throughout the day with the consumption of 4-5 meals/snacks    Intervention  Provide education about signs/symptoms and action to take for hypo/hyperglycemia.;Provide education about proper nutrition, including hydration, and aerobic/resistive exercise prescription  along with prescribed medications to achieve blood glucose in normal ranges: Fasting glucose 65-99 mg/dL    Expected Outcomes  Short Term: Participant verbalizes understanding of the signs/symptoms and immediate care of hyper/hypoglycemia, proper foot care and importance of medication, aerobic/resistive exercise and nutrition plan for blood glucose control.;Long Term: Attainment of HbA1C < 7%.    Intervention  Provide education on lifestyle modifcations including regular physical activity/exercise, weight management, moderate sodium restriction and increased consumption of fresh fruit, vegetables, and low fat dairy, alcohol moderation, and smoking cessation.;Monitor prescription use compliance.    Expected Outcomes  Short Term: Continued assessment and intervention until BP is < 140/50mm HG in hypertensive participants. < 130/31mm HG in hypertensive participants with diabetes, heart failure or chronic kidney disease.;Long Term: Maintenance of blood pressure at goal levels.    Intervention  Provide education and support for participant on nutrition & aerobic/resistive exercise along with prescribed medications to achieve LDL 70mg , HDL >40mg .    Expected Outcomes  Short Term: Participant states understanding of desired cholesterol values and is compliant with medications prescribed. Participant is following exercise prescription and nutrition guidelines.;Long Term: Cholesterol controlled with medications as prescribed, with individualized exercise RX and with personalized nutrition plan. Value goals: LDL < 70mg , HDL > 40 mg.       Tobacco Use Initial Evaluation: Social History   Tobacco Use  Smoking Status Former Smoker  . Packs/day: 0.25  . Years: 20.00  . Pack years: 5.00  . Types: Cigarettes  . Quit date: 07/24/2015  . Years since quitting: 4.3  Smokeless Tobacco Never Used    Exercise Goals and Review: Exercise Goals    Row Name 12/05/19 1251             Exercise Goals   Increase  Physical Activity  Yes       Intervention  Provide advice, education, support and counseling about physical activity/exercise needs.;Develop an individualized exercise prescription for aerobic and resistive training based on initial evaluation findings, risk stratification, comorbidities and participant's personal goals.       Expected Outcomes  Short Term: Attend rehab on a regular basis to increase amount of physical activity.;Long Term: Add in home exercise to make exercise part of routine and to increase amount of physical activity.;Long Term: Exercising regularly at least 3-5 days a week.       Increase Strength and Stamina  Yes       Intervention  Provide advice, education, support and counseling about physical activity/exercise needs.;Develop an individualized exercise prescription for aerobic and resistive training based on initial evaluation findings, risk stratification, comorbidities and participant's personal goals.       Expected Outcomes  Short Term: Increase workloads from initial exercise prescription for resistance, speed, and METs.;Short Term: Perform resistance training exercises routinely during rehab and add in resistance  training at home;Long Term: Improve cardiorespiratory fitness, muscular endurance and strength as measured by increased METs and functional capacity ( )       Able to understand and use rate of perceived exertion (RPE) scale  Yes       Intervention  Provide education and explanation on how to use RPE scale       Expected Outcomes  Short Term: Able to use RPE daily in rehab to express subjective intensity level;Long Term:  Able to use RPE to guide intensity level when exercising independently       Able to understand and use Dyspnea scale  Yes       Intervention  Provide education and explanation on how to use Dyspnea scale       Expected Outcomes  Short Term: Able to use Dyspnea scale daily in rehab to express subjective sense of shortness of breath during  exertion;Long Term: Able to use Dyspnea scale to guide intensity level when exercising independently       Knowledge and understanding of Target Heart Rate Range (THRR)  Yes       Intervention  Provide education and explanation of THRR including how the numbers were predicted and where they are located for reference       Expected Outcomes  Short Term: Able to state/look up THRR;Short Term: Able to use daily as guideline for intensity in rehab;Long Term: Able to use THRR to govern intensity when exercising independently       Able to check pulse independently  Yes       Intervention  Provide education and demonstration on how to check pulse in carotid and radial arteries.;Review the importance of being able to check your own pulse for safety during independent exercise       Expected Outcomes  Short Term: Able to explain why pulse checking is important during independent exercise;Long Term: Able to check pulse independently and accurately       Understanding of Exercise Prescription  Yes       Intervention  Provide education, explanation, and written materials on patient's individual exercise prescription       Expected Outcomes  Short Term: Able to explain program exercise prescription;Long Term: Able to explain home exercise prescription to exercise independently          Copy of goals given to participant.

## 2019-12-06 LAB — URINE CULTURE
MICRO NUMBER:: 10362869
SPECIMEN QUALITY:: ADEQUATE

## 2019-12-10 ENCOUNTER — Encounter: Payer: Managed Care, Other (non HMO) | Admitting: *Deleted

## 2019-12-10 ENCOUNTER — Other Ambulatory Visit: Payer: Self-pay

## 2019-12-10 DIAGNOSIS — Z9889 Other specified postprocedural states: Secondary | ICD-10-CM | POA: Diagnosis not present

## 2019-12-10 LAB — GLUCOSE, CAPILLARY
Glucose-Capillary: 117 mg/dL — ABNORMAL HIGH (ref 70–99)
Glucose-Capillary: 116 mg/dL — ABNORMAL HIGH (ref 70–99)

## 2019-12-10 NOTE — Progress Notes (Signed)
Daily Session Note  Patient Details  Name: Bonnie Ryan MRN: [id number] Date of Birth: 10-Oct-1955 Referring Provider:     Cardiac Rehab from 12/05/2019 in [facility]  Referring Provider  End      Encounter Date: 12/10/2019  Check In: Session Check In - 12/10/19 1132      Check-In   Supervising physician immediately available to respond to emergencies  See telemetry face sheet for immediately available ER MD    Location  [facility]    Staff Present  Renita Papa, RN Moises Blood, BS, ACSM CEP, Exercise Physiologist;Amanda Oletta Darter, [place], ACSM CEP, Exercise Physiologist    Virtual Visit  No    Medication changes reported      No    Fall or balance concerns reported     No    Warm-up and Cool-down  Performed on first and last piece of equipment    Resistance Training Performed  Yes    VAD Patient?  No    PAD/SET Patient?  No      Pain Assessment   Currently in Pain?  No/denies          Social History   Tobacco Use  Smoking Status Former Smoker  . Packs/day: 0.25  . Years: 20.00  . Pack years: 5.00  . Types: Cigarettes  . Quit date: 07/24/2015  . Years since quitting: 4.3  Smokeless Tobacco Never Used    Goals Met:  Independence with exercise equipment Exercise tolerated well No report of cardiac concerns or symptoms Strength training completed today  Goals Unmet:  Not Applicable  Comments: First full day of exercise!  Patient was oriented to gym and equipment including functions, settings, policies, and procedures.  Patient's individual exercise prescription and treatment plan were reviewed.  All starting workloads were established based on the results of the 6 minute walk test done at initial orientation visit.  The plan for exercise progression was also introduced and progression will be customized based on patient's performance and goals.    Dr. Emily Filbert is Medical Director for [facility] and [name].

## 2019-12-11 ENCOUNTER — Other Ambulatory Visit: Payer: Self-pay | Admitting: Internal Medicine

## 2019-12-12 ENCOUNTER — Other Ambulatory Visit: Payer: Self-pay

## 2019-12-12 ENCOUNTER — Encounter: Payer: Managed Care, Other (non HMO) | Admitting: *Deleted

## 2019-12-12 ENCOUNTER — Encounter: Payer: Self-pay | Admitting: *Deleted

## 2019-12-12 DIAGNOSIS — Z9889 Other specified postprocedural states: Secondary | ICD-10-CM

## 2019-12-12 LAB — GLUCOSE, CAPILLARY
Glucose-Capillary: 135 mg/dL — ABNORMAL HIGH (ref 70–99)
Glucose-Capillary: 117 mg/dL — ABNORMAL HIGH (ref 70–99)

## 2019-12-12 NOTE — Progress Notes (Signed)
Daily Session Note  Patient Details  Name: Bonnie Ryan MRN: [id number] Date of Birth: September 10, 1955 Referring Provider:     Cardiac Rehab from 12/05/2019 in [facility]  Referring Provider  End      Encounter Date: 12/12/2019  Check In: Session Check In - 12/12/19 1120      Check-In   Supervising physician immediately available to respond to emergencies  See telemetry face sheet for immediately available ER MD    Location  [facility]    Staff Present  Renita Papa, RN BSN;Joseph Foy [place], [place], ACSM CEP, Exercise Physiologist    Virtual Visit  No    Medication changes reported      No    Fall or balance concerns reported     No    Warm-up and Cool-down  Performed on first and last piece of equipment    Resistance Training Performed  Yes    VAD Patient?  No    PAD/SET Patient?  No      Pain Assessment   Currently in Pain?  No/denies          Social History   Tobacco Use  Smoking Status Former Smoker  . Packs/day: 0.25  . Years: 20.00  . Pack years: 5.00  . Types: Cigarettes  . Quit date: 07/24/2015  . Years since quitting: 4.3  Smokeless Tobacco Never Used    Goals Met:  Independence with exercise equipment Exercise tolerated well No report of cardiac concerns or symptoms Strength training completed today  Goals Unmet:  Not Applicable  Comments: Pt able to follow exercise prescription today without complaint.  Will continue to monitor for progression.    Dr. Emily Filbert is Medical Director for [place] [name].

## 2019-12-12 NOTE — Progress Notes (Signed)
Cardiac Individual Treatment Plan  Patient Details  Name: Bonnie Ryan MRN: [id number] Date of Birth: Jul 16, 1956 Referring Provider:     Cardiac Rehab from 12/05/2019 in [facility] Cardiac and Pulmonary Rehab  Referring Provider  End      Initial Encounter Date:    Cardiac Rehab from 12/05/2019 in [facility] Cardiac and Pulmonary Rehab  Date  12/05/19      Visit Diagnosis: S/P minimally invasive mitral valve repair  Patient's Home Medications on Admission:  Current Outpatient Medications:  .  aspirin EC 81 MG tablet, Take 1 tablet (81 mg total) by mouth daily., Disp:  , Rfl:  .  furosemide (LASIX) 40 MG tablet, Take 1 tablet (40 mg total) by mouth as needed for fluid or edema., Disp: 90 tablet, Rfl: 1 .  glucose blood (COOL BLOOD GLUCOSE TEST STRIPS) test strip, Use as instructed Use to test blood glucose daily. DX E11.9, Disp: 100 each, Rfl: 0 .  Insulin Pen Needle (PEN NEEDLES) 32G X 6 MM MISC, 1 application by Does not apply route daily. DX E11.9, Disp: 100 each, Rfl: 0 .  losartan (COZAAR) 25 MG tablet, TAKE 1 TABLET BY MOUTH EVERY DAY, Disp: 90 tablet, Rfl: 0 .  losartan (COZAAR) 50 MG tablet, Take 25 mg by mouth daily., Disp: , Rfl:  .  metFORMIN (GLUCOPHAGE) 500 MG tablet, Take 1 tablet (500 mg total) by mouth 2 (two) times daily with a meal., Disp: 180 tablet, Rfl: 3 .  metoprolol succinate (TOPROL-XL) 25 MG 24 hr tablet, TAKE 1 TABLET (25 MG TOTAL) BY MOUTH DAILY. PATIENT WILL CALL WHEN READY FOR REFILL., Disp: 30 tablet, Rfl: 1 .  potassium chloride SA (KLOR-CON) 20 MEQ tablet, Take 1 tablet (20 mEq total) by mouth daily., Disp: 30 tablet, Rfl: 3 .  rosuvastatin (CRESTOR) 20 MG tablet, Take 1 tablet (20 mg total) by mouth at bedtime., Disp: , Rfl:  .  VICTOZA 18 MG/3ML SOPN, Inject 0.3 mLs (1.8 mg total) into the skin daily., Disp: 5 pen, Rfl: 3 .  warfarin (COUMADIN) 5 MG tablet, TAKE 1.5 TABLETS ON MONDAYS AND WEDNESDAYS. TAKE 1 TABLET ON ALL OTHER DAYS. AS DIRECTED BY THE COUMADIN  CLINIC., Disp: 32 tablet, Rfl: 1 .  zolpidem (AMBIEN CR) 6.25 MG CR tablet, Take 1 tablet (6.25 mg total) by mouth at bedtime as needed for sleep., Disp: 30 tablet, Rfl: 0  Past Medical History: Past Medical History:  Diagnosis Date  . Diabetes mellitus    Previously diet-controlled  . Endometriosis   . Family history of adverse reaction to anesthesia    mother used to have vomiting after   . Hypercholesteremia   . Hypertension   . Mitral Valve Prolapse w/ Mitral regurgitation    a. 10/2009 Echo: Mod MVP, mild MR; b. 07/2019 Echo: EF 60-65%, Gr2 DD, Mod MR; c. 07/2019 TEE: EF 60-65%. No rwma. Mod dil LA. Sev MR w/ sev bileaflet prolapse. Mild TR; d. 08/2019 s/p minimally invasive MV repair.  . Non-obstructive CAD (coronary artery disease)    a. 09/2006 Cath: LAD 30, RCA 25; b.07/2011 Cath: LM Ca2+, 30-27mAD, RCA 30-40p, EF 55-65%, 2+ MR; c. 07/2019 Cath: LM 140m, LAD 5044mI nl, LCX 15m67mA 45p, 29m.48mPosMarland Kitchen-op atrial fibrillation (HCC) [place]. 08/2019 following MV repair-->Amio/warfarin initiated.  Initially prolonged QT w/ IV amio-->converted to sinus.  . RLS (restless legs syndrome)   . S/P minimally invasive mitral valve repair 08/30/2019   a. 08/2019 s/p Complex valvuloplasty including  artificial Goretex neochord placement H88 with plication of posterior leaflet and 40 mm Sorin Memo 4D ring annuloplasty via right mini thoracotomy approach.  Marland Kitchen UTI (urinary tract infection)     Tobacco Use: Social History   Tobacco Use  Smoking Status Former Smoker  . Packs/day: 0.25  . Years: 20.00  . Pack years: 5.00  . Types: Cigarettes  . Quit date: 07/24/2015  . Years since quitting: 4.3  Smokeless Tobacco Never Used    Labs: Recent Review Scientist, physiological    Labs for ITP Cardiac and Pulmonary Rehab Latest Ref Rng & Units 08/30/2019 08/30/2019 08/30/2019 08/30/2019 11/30/2019   Cholestrol <200 mg/dL - - - - 192   LDLCALC mg/dL (calc) - - - - 91   LDLDIRECT mg/dL - - - - -   HDL > OR = 50 mg/dL - -  - - 58   Trlycerides <150 mg/dL - - - - 326(H)   Hemoglobin A1c <5.7 % of total Hgb - - - - 8.2(H)   PHART 7.350 - 7.450 7.297(L) - 7.362 7.354 -   PCO2ART 32.0 - 48.0 mmHg 43.5 - 39.0 41.7 -   HCO3 20.0 - 28.0 mmol/L 21.2 - 22.1 23.4 -   TCO2 22 - 32 mmol/L '23 23 23 25 ' -   ACIDBASEDEF 0.0 - 2.0 mmol/L 5.0(H) - 3.0(H) 2.0 -   O2SAT % 98.0 - 100.0 97.0 -       Exercise Target Goals: Exercise Program Goal: Individual exercise prescription set using results from initial 6 min walk test and THRR while considering  patient's activity barriers and safety.   Exercise Prescription Goal: Initial exercise prescription builds to 30-45 minutes a day of aerobic activity, 2-3 days per week.  Home exercise guidelines will be given to patient during program as part of exercise prescription that the participant will acknowledge.   Education: Aerobic Exercise & Resistance Training: - Gives group verbal and written instruction on the various components of exercise. Focuses on aerobic and resistive training programs and the benefits of this training and how to safely progress through these programs..   Education: Exercise & Equipment Safety: - Individual verbal instruction and demonstration of equipment use and safety with use of the equipment.   Cardiac Rehab from 12/05/2019 in [facility] Cardiac and Pulmonary Rehab  Date  12/05/19  Educator  AS  Instruction Review Code  1- Verbalizes Understanding      Education: Exercise Physiology & General Exercise Guidelines: - Group verbal and written instruction with models to review the exercise physiology of the cardiovascular system and associated critical values. Provides general exercise guidelines with specific guidelines to those with heart or lung disease.    Education: Flexibility, Balance, Mind/Body Relaxation: Provides group verbal/written instruction on the benefits of flexibility and balance training, including mind/body exercise modes such as yoga,  pilates and tai chi.  Demonstration and skill practice provided.   Activity Barriers & Risk Stratification: Activity Barriers & Cardiac Risk Stratification - 12/04/19 1410      Activity Barriers & Cardiac Risk Stratification   Activity Barriers  Deconditioning;Joint Problems;Muscular Weakness;Other (comment);Shortness of Breath;History of Falls    Comments  occasional knee pain (bilateral surgical history), broke ankles last sping    Cardiac Risk Stratification  Low       6 Minute Walk: 6 Minute Walk    Row Name 12/05/19 1247         6 Minute Walk   Phase  Initial     Distance  1315 feet     Walk Time  6 minutes     # of Rest Breaks  0     MPH  2.5     METS  3.13     RPE  10     Perceived Dyspnea   1     Symptoms  No     Resting HR  87 bpm     Resting BP  124/78     Resting Oxygen Saturation   96 %     Exercise Oxygen Saturation  during 6 min walk  98 %     Max Ex. HR  99 bpm     Max Ex. BP  124/74     2 Minute Post BP  110/74        Oxygen Initial Assessment:   Oxygen Re-Evaluation:   Oxygen Discharge (Final Oxygen Re-Evaluation):   Initial Exercise Prescription: Initial Exercise Prescription - 12/05/19 1200      Date of Initial Exercise RX and Referring Provider   Date  12/05/19    Referring Provider  End      Treadmill   MPH  2.5    Grade  0.5    Minutes  15    METs  3.09      NuStep   Level  3    SPM  80    Minutes  15    METs  3      Elliptical   Level  1    Speed  2.5    Minutes  15      T5 Nustep   Level  1    SPM  80    Minutes  15    METs  3      Prescription Details   Frequency (times per week)  3    Duration  Progress to 30 minutes of continuous aerobic without signs/symptoms of physical distress      Intensity   THRR 40-80% of Max Heartrate  115-143    Ratings of Perceived Exertion  11-15    Perceived Dyspnea  0-4      Resistance Training   Training Prescription  Yes    Weight  3 lb    Reps  10-15       Perform  Capillary Blood Glucose checks as needed.  Exercise Prescription Changes: Exercise Prescription Changes    Row Name 12/05/19 1200             Response to Exercise   Blood Pressure (Admit)  124/78       Blood Pressure (Exercise)  124/74       Blood Pressure (Exit)  110/74       Heart Rate (Admit)  87 bpm       Heart Rate (Exercise)  99 bpm       Heart Rate (Exit)  96 bpm       Oxygen Saturation (Admit)  96 %       Oxygen Saturation (Exercise)  98 %       Rating of Perceived Exertion (Exercise)  10       Perceived Dyspnea (Exercise)  1       Symptoms  none          Exercise Comments:   Exercise Goals and Review: Exercise Goals    Row Name 12/05/19 1251             Exercise Goals   Increase Physical Activity  Yes  Intervention  Provide advice, education, support and counseling about physical activity/exercise needs.;Develop an individualized exercise prescription for aerobic and resistive training based on initial evaluation findings, risk stratification, comorbidities and participant's personal goals.       Expected Outcomes  Short Term: Attend rehab on a regular basis to increase amount of physical activity.;Long Term: Add in home exercise to make exercise part of routine and to increase amount of physical activity.;Long Term: Exercising regularly at least 3-5 days a week.       Increase Strength and Stamina  Yes       Intervention  Provide advice, education, support and counseling about physical activity/exercise needs.;Develop an individualized exercise prescription for aerobic and resistive training based on initial evaluation findings, risk stratification, comorbidities and participant's personal goals.       Expected Outcomes  Short Term: Increase workloads from initial exercise prescription for resistance, speed, and METs.;Short Term: Perform resistance training exercises routinely during rehab and add in resistance training at home;Long Term: Improve  cardiorespiratory fitness, muscular endurance and strength as measured by increased METs and functional capacity (6MWT)       Able to understand and use rate of perceived exertion (RPE) scale  Yes       Intervention  Provide education and explanation on how to use RPE scale       Expected Outcomes  Short Term: Able to use RPE daily in rehab to express subjective intensity level;Long Term:  Able to use RPE to guide intensity level when exercising independently       Able to understand and use Dyspnea scale  Yes       Intervention  Provide education and explanation on how to use Dyspnea scale       Expected Outcomes  Short Term: Able to use Dyspnea scale daily in rehab to express subjective sense of shortness of breath during exertion;Long Term: Able to use Dyspnea scale to guide intensity level when exercising independently       Knowledge and understanding of Target Heart Rate Range (THRR)  Yes       Intervention  Provide education and explanation of THRR including how the numbers were predicted and where they are located for reference       Expected Outcomes  Short Term: Able to state/look up THRR;Short Term: Able to use daily as guideline for intensity in rehab;Long Term: Able to use THRR to govern intensity when exercising independently       Able to check pulse independently  Yes       Intervention  Provide education and demonstration on how to check pulse in carotid and radial arteries.;Review the importance of being able to check your own pulse for safety during independent exercise       Expected Outcomes  Short Term: Able to explain why pulse checking is important during independent exercise;Long Term: Able to check pulse independently and accurately       Understanding of Exercise Prescription  Yes       Intervention  Provide education, explanation, and written materials on patient's individual exercise prescription       Expected Outcomes  Short Term: Able to explain program exercise  prescription;Long Term: Able to explain home exercise prescription to exercise independently          Exercise Goals Re-Evaluation : Exercise Goals Re-Evaluation    [place] Name 12/10/19 1135             Exercise Goal Re-Evaluation   Exercise Goals  Review  Increase Physical Activity;Able to understand and use rate of perceived exertion (RPE) scale;Knowledge and understanding of Target Heart Rate Range (THRR);Increase Strength and Stamina;Able to check pulse independently;Understanding of Exercise Prescription       Comments  Reviewed RPE and dyspnea scales, THR and program prescription with pt today.  Pt voiced understanding and was given a copy of goals to take home.       Expected Outcomes  Short: Use RPE daily to regulate intensity. Long: Follow program prescription in THR.          Discharge Exercise Prescription (Final Exercise Prescription Changes): Exercise Prescription Changes - 12/05/19 1200      Response to Exercise   Blood Pressure (Admit)  124/78    Blood Pressure (Exercise)  124/74    Blood Pressure (Exit)  110/74    Heart Rate (Admit)  87 bpm    Heart Rate (Exercise)  99 bpm    Heart Rate (Exit)  96 bpm    Oxygen Saturation (Admit)  96 %    Oxygen Saturation (Exercise)  98 %    Rating of Perceived Exertion (Exercise)  10    Perceived Dyspnea (Exercise)  1    Symptoms  none       Nutrition:  Target Goals: Understanding of nutrition guidelines, daily intake of sodium <1562m, cholesterol <2057m calories 30% from fat and 7% or less from saturated fats, daily to have 5 or more servings of fruits and vegetables.  Education: Controlling Sodium/Reading Food Labels -Group verbal and written material supporting the discussion of sodium use in heart healthy nutrition. Review and explanation with models, verbal and written materials for utilization of the food label.   Education: General Nutrition Guidelines/Fats and Fiber: -Group instruction provided by verbal, written  material, models and posters to present the general guidelines for heart healthy nutrition. Gives an explanation and review of dietary fats and fiber.   Biometrics: Pre Biometrics - 12/05/19 1252      Pre Biometrics   Height  '5\' 7"'  (1.702 m)    Weight  182 lb 14.4 oz (83 kg)    BMI (Calculated)  28.64    Single Leg Stand  17.72 seconds        Nutrition Therapy Plan and Nutrition Goals:   Nutrition Assessments: Nutrition Assessments - 12/05/19 1256      MEDFICTS Scores   Pre Score  3       MEDIFICTS Score Key:          ?70 Need to make dietary changes          40-70 Heart Healthy Diet         ? 40 Therapeutic Level Cholesterol Diet  Nutrition Goals Re-Evaluation:   Nutrition Goals Discharge (Final Nutrition Goals Re-Evaluation):   Psychosocial: Target Goals: Acknowledge presence or absence of significant depression and/or stress, maximize coping skills, provide positive support system. Participant is able to verbalize types and ability to use techniques and skills needed for reducing stress and depression.   Education: Depression - Provides group verbal and written instruction on the correlation between heart/lung disease and depressed mood, treatment options, and the stigmas associated with seeking treatment.   Education: Sleep Hygiene -Provides group verbal and written instruction about how sleep can affect your health.  Define sleep hygiene, discuss sleep cycles and impact of sleep habits. Review good sleep hygiene tips.     Education: Stress and Anxiety: - Provides group verbal and written instruction about the health risks  of elevated stress and causes of high stress.  Discuss the correlation between heart/lung disease and anxiety and treatment options. Review healthy ways to manage with stress and anxiety.    Initial Review & Psychosocial Screening: Initial Psych Review & Screening - 12/04/19 1412      Initial Review   Current issues with  Current Sleep  Concerns;Current Stress Concerns    Source of Stress Concerns  Chronic Illness;Unable to perform yard/household activities;Unable to participate in former interests or hobbies    Comments  uses ambien for sleep 4-5 hours a night, recovering from surgery      [place]?  Yes   son and his wife (nurse practition), mother all live close by     Barriers   Psychosocial barriers to participate in program  The patient should benefit from training in stress management and relaxation.;There are no identifiable barriers or psychosocial needs.      Screening Interventions   Interventions  Encouraged to exercise;Provide feedback about the scores to participant;To provide support and resources with identified psychosocial needs    Expected Outcomes  Long Term Goal: Stressors or current issues are controlled or eliminated.;Short Term goal: Utilizing psychosocial counselor, staff and physician to assist with identification of specific Stressors or current issues interfering with healing process. Setting desired goal for each stressor or current issue identified.;Short Term goal: Identification and review with participant of any Quality of Life or Depression concerns found by scoring the questionnaire.;Long Term goal: The participant improves quality of Life and PHQ9 Scores as seen by post scores and/or verbalization of changes       Quality of Life Scores:  Quality of Life - 12/05/19 1259      Quality of Life   Select  Quality of Life      Quality of Life Scores   Health/Function Pre  26.14 %    Socioeconomic Pre  27.43 %    Psych/Spiritual Pre  25.71 %    Family Pre  30 %    GLOBAL Pre  26.81 %      Scores of 19 and below usually indicate a poorer quality of life in these areas.  A difference of  2-3 points is a clinically meaningful difference.  A difference of 2-3 points in the total score of the Quality of Life Index has been associated with significant improvement in  overall quality of life, self-image, physical symptoms, and general health in studies assessing change in quality of life.  PHQ-9: Recent Review Flowsheet Data    Depression screen [employer] 2/9 12/05/2019 10/22/2019 02/27/2019   Decreased Interest 0 0 0   Down, Depressed, Hopeless 0 0 0   PHQ - 2 Score 0 0 0   Altered sleeping 3  0 0   Tired, decreased energy 1 0 0   Change in appetite 3  0 0   Feeling bad or failure about yourself  0 0 0   Trouble concentrating 0 0 0   Moving slowly or fidgety/restless 0 0 0   Suicidal thoughts 0 0 0   PHQ-9 Score 7 0 0   Difficult doing work/chores - Not difficult at all Not difficult at all     Interpretation of Total Score  Total Score Depression Severity:  1-4 = Minimal depression, 5-9 = Mild depression, 10-14 = Moderate depression, 15-19 = Moderately severe depression, 20-27 = Severe depression   Psychosocial Evaluation and Intervention: Psychosocial Evaluation - 12/04/19 1415  Psychosocial Evaluation & Interventions   Interventions  Encouraged to exercise with the program and follow exercise prescription    Comments  Danaye is coming  to cardiac rehab after a mitral valve repair.  She has a great support system in her family that lives near by.  She is looking forward to rehab to get herself back.  She has been dealing with a leaky valve since her 30s and has always gotten by up until last year when her symptoms started to interfere with her life.  She gave up going to the gym after work with friends as she just was not able to go anymore.  She really wants to get her stamina back and be able to go again.  She also wants to make sure her heart is back in working order.    Expected Outcomes  Short: Attend rehab to get back to exercise Long: Build stamina back up to be able to go again.    Continue Psychosocial Services   Follow up required by staff       Psychosocial Re-Evaluation:   Psychosocial Discharge (Final Psychosocial  Re-Evaluation):   Vocational Rehabilitation: Provide vocational rehab assistance to qualifying candidates.   Vocational Rehab Evaluation & Intervention: Vocational Rehab - 12/04/19 1412      Initial Vocational Rehab Evaluation & Intervention   Assessment shows need for Vocational Rehabilitation  Yes   still working      Education: Education Goals: Education classes will be provided on a variety of topics geared toward better understanding of heart health and risk factor modification. Participant will state understanding/return demonstration of topics presented as noted by education test scores.  Learning Barriers/Preferences: Learning Barriers/Preferences - 12/04/19 1411      Learning Barriers/Preferences   Learning Barriers  Sight   glasses (needs new Rx)   Learning Preferences  Skilled Demonstration;Verbal Instruction       General Cardiac Education Topics:  AED/CPR: - Group verbal and written instruction with the use of models to demonstrate the basic use of the AED with the basic ABC's of resuscitation.   Anatomy & Physiology of the Heart: - Group verbal and written instruction and models provide basic cardiac anatomy and physiology, with the coronary electrical and arterial systems. Review of Valvular disease and Heart Failure   Cardiac Procedures: - Group verbal and written instruction to review commonly prescribed medications for heart disease. Reviews the medication, class of the drug, and side effects. Includes the steps to properly store meds and maintain the prescription regimen. (beta blockers and nitrates)   Cardiac Medications I: - Group verbal and written instruction to review commonly prescribed medications for heart disease. Reviews the medication, class of the drug, and side effects. Includes the steps to properly store meds and maintain the prescription regimen.   Cardiac Medications II: -Group verbal and written instruction to review commonly  prescribed medications for heart disease. Reviews the medication, class of the drug, and side effects. (all other drug classes)    Go Sex-Intimacy & Heart Disease, Get SMART - Goal Setting: - Group verbal and written instruction through game format to discuss heart disease and the return to sexual intimacy. Provides group verbal and written material to discuss and apply goal setting through the application of the S.M.A.R.T. Method.   Other Matters of the Heart: - Provides group verbal, written materials and models to describe Stable Angina and Peripheral Artery. Includes description of the disease process and treatment options available to the cardiac patient.  Infection Prevention: - Provides verbal and written material to individual with discussion of infection control including proper hand washing and proper equipment cleaning during exercise session.   Cardiac Rehab from 12/05/2019 in [facility] Cardiac and Pulmonary Rehab  Date  12/05/19  Educator  AS  Instruction Review Code  1- Verbalizes Understanding      Falls Prevention: - Provides verbal and written material to individual with discussion of falls prevention and safety.   Cardiac Rehab from 12/05/2019 in [facility] Cardiac and Pulmonary Rehab  Date  12/05/19  Educator  AS  Instruction Review Code  1- Verbalizes Understanding      Other: -Provides group and verbal instruction on various topics (see comments)   Knowledge Questionnaire Score: Knowledge Questionnaire Score - 12/05/19 1258      Knowledge Questionnaire Score   Pre Score  25/26       Core Components/Risk Factors/Patient Goals at Admission: Personal Goals and Risk Factors at Admission - 12/05/19 1253      Core Components/Risk Factors/Patient Goals on Admission    Weight Management  Yes;Weight Loss;Obesity    Intervention  Weight Management: Develop a combined nutrition and exercise program designed to reach desired caloric intake, while maintaining appropriate  intake of nutrient and fiber, sodium and fats, and appropriate energy expenditure required for the weight goal.;Weight Management: Provide education and appropriate resources to help participant work on and attain dietary goals.;Obesity: Provide education and appropriate resources to help participant work on and attain dietary goals.;Weight Management/Obesity: Establish reasonable short term and long term weight goals.    Admit Weight  182 lb 14.4 oz (83 kg)    Goal Weight: Short Term  172 lb (78 kg)    Goal Weight: Long Term  162 lb (73.5 kg)    Expected Outcomes  Short Term: Continue to assess and modify interventions until short term weight is achieved;Long Term: Adherence to nutrition and physical activity/exercise program aimed toward attainment of established weight goal;Weight Loss: Understanding of general recommendations for a balanced deficit meal plan, which promotes 1-2 lb weight loss per week and includes a negative energy balance of [phone] kcal/d;Understanding recommendations for meals to include 15-35% energy as protein, 25-35% energy from fat, 35-60% energy from carbohydrates, less than 262m of dietary cholesterol, 20-35 gm of total fiber daily;Understanding of distribution of calorie intake throughout the day with the consumption of 4-5 meals/snacks    Intervention  Provide education about signs/symptoms and action to take for hypo/hyperglycemia.;Provide education about proper nutrition, including hydration, and aerobic/resistive exercise prescription along with prescribed medications to achieve blood glucose in normal ranges: Fasting glucose 65-99 mg/dL    Expected Outcomes  Short Term: Participant verbalizes understanding of the signs/symptoms and immediate care of hyper/hypoglycemia, proper foot care and importance of medication, aerobic/resistive exercise and nutrition plan for blood glucose control.;Long Term: Attainment of HbA1C < 7%.    Intervention  Provide education on lifestyle  modifcations including regular physical activity/exercise, weight management, moderate sodium restriction and increased consumption of fresh fruit, vegetables, and low fat dairy, alcohol moderation, and smoking cessation.;Monitor prescription use compliance.    Expected Outcomes  Short Term: Continued assessment and intervention until BP is < 140/947mHG in hypertensive participants. < 130/8021mG in hypertensive participants with diabetes, heart failure or chronic kidney disease.;Long Term: Maintenance of blood pressure at goal levels.    Intervention  Provide education and support for participant on nutrition & aerobic/resistive exercise along with prescribed medications to achieve LDL <21m15mDL >40mg56m  Expected Outcomes  Short Term: Participant states understanding of desired cholesterol values and is compliant with medications prescribed. Participant is following exercise prescription and nutrition guidelines.;Long Term: Cholesterol controlled with medications as prescribed, with individualized exercise RX and with personalized nutrition plan. Value goals: LDL < 33m, HDL > 40 mg.       Education:Diabetes - Individual verbal and written instruction to review signs/symptoms of diabetes, desired ranges of glucose level fasting, after meals and with exercise. Acknowledge that pre and post exercise glucose checks will be done for 3 sessions at entry of program.   Education: Know Your Numbers and Risk Factors: -Group verbal and written instruction about important numbers in your health.  Discussion of what are risk factors and how they play a role in the disease process.  Review of Cholesterol, Blood Pressure, Diabetes, and BMI and the role they play in your overall health.   Core Components/Risk Factors/Patient Goals Review:    Core Components/Risk Factors/Patient Goals at Discharge (Final Review):    ITP Comments: ITP Comments    Row Name 12/04/19 1439 12/05/19 1310 12/10/19 1134 12/12/19  0529     ITP Comments  Completed virtual orientation today.  EP evaluation is scheduled for Wednesday 4/14 at 1130am.  [facility]  Completed 6MWT and gym orientation.  Initial ITP created and sent for review to Dr. MEmily Filbert Medical Director.  First full day of exercise!  Patient was oriented to gym and equipment including functions, settings, policies, and procedures.  Patient's individual exercise prescription and treatment plan were reviewed.  All starting workloads were established based on the results of the 6 minute walk test done at initial orientation visit.  The plan for exercise progression was also introduced and progression will be customized based on patient's performance and goals.  30 Day review completed. Medical Director review done, changes made as directed,and approval shown by signature of [employer] researcher  New to program       Comments:

## 2019-12-14 LAB — HEPATITIS B SURFACE ANTIGEN: Hepatitis B Surface Ag: NONREACTIVE

## 2019-12-14 LAB — HEPATITIS B CORE ANTIBODY, TOTAL: Hep B Core Total Ab: NONREACTIVE

## 2019-12-14 LAB — ANA: Anti Nuclear Antibody (ANA): NEGATIVE

## 2019-12-14 LAB — ALPHA-1 ANTITRYPSIN PHENOTYPE: A-1 Antitrypsin, Ser: 167 mg/dL (ref 83–199)

## 2019-12-14 LAB — HEPATITIS B SURFACE ANTIBODY,QUALITATIVE: Hep B S Ab: NONREACTIVE

## 2019-12-14 LAB — CERULOPLASMIN: Ceruloplasmin: 33 mg/dL (ref 18–53)

## 2019-12-14 LAB — HEPATITIS A ANTIBODY, TOTAL: Hepatitis A AB,Total: NONREACTIVE

## 2019-12-17 ENCOUNTER — Other Ambulatory Visit: Payer: Self-pay

## 2019-12-17 ENCOUNTER — Other Ambulatory Visit (HOSPITAL_COMMUNITY): Payer: Self-pay | Admitting: Nurse Practitioner

## 2019-12-17 ENCOUNTER — Encounter: Payer: Managed Care, Other (non HMO) | Admitting: *Deleted

## 2019-12-17 DIAGNOSIS — Z9889 Other specified postprocedural states: Secondary | ICD-10-CM | POA: Diagnosis not present

## 2019-12-17 LAB — GLUCOSE, CAPILLARY
Glucose-Capillary: 121 mg/dL — ABNORMAL HIGH (ref 70–99)
Glucose-Capillary: 134 mg/dL — ABNORMAL HIGH (ref 70–99)

## 2019-12-17 NOTE — Progress Notes (Signed)
Daily Session Note  Patient Details  Name: Bonnie Ryan MRN: [id number] Date of Birth: 03-05-1956 Referring Provider:     Cardiac Rehab from 12/05/2019 in [facility] Cardiac and Pulmonary Rehab  Referring Provider  End      Encounter Date: 12/17/2019  Check In: Session Check In - 12/17/19 1128      Check-In   Supervising physician immediately available to respond to emergencies  See telemetry face sheet for immediately available ER MD    Location  [facility]-Cardiac & Pulmonary Rehab    Staff Present  Renita Papa, RN Moises Blood, BS, ACSM CEP, Exercise Physiologist;Amanda Oletta Darter, [place], ACSM CEP, Exercise Physiologist    Virtual Visit  No    Medication changes reported      No    Fall or balance concerns reported     No    Warm-up and Cool-down  Performed on first and last piece of equipment    Resistance Training Performed  Yes    VAD Patient?  No    PAD/SET Patient?  No      Pain Assessment   Currently in Pain?  No/denies          Social History   Tobacco Use  Smoking Status Former Smoker  . Packs/day: 0.25  . Years: 20.00  . Pack years: 5.00  . Types: Cigarettes  . Quit date: 07/24/2015  . Years since quitting: 4.4  Smokeless Tobacco Never Used    Goals Met:  Independence with exercise equipment Exercise tolerated well No report of cardiac concerns or symptoms Strength training completed today  Goals Unmet:  Not Applicable  Comments: Pt able to follow exercise prescription today without complaint.  Will continue to monitor for progression.    Dr. Emily Filbert is Medical Director for [place] and [name].

## 2019-12-18 LAB — ANTI-SMOOTH MUSCLE ANTIBODY, IGG: Actin (Smooth Muscle) Antibody (IGG): <20 U (ref <20)

## 2019-12-18 LAB — MITOCHONDRIAL ANTIBODIES: Mitochondrial M2 Ab, IgG: <=20 U (ref <=20.0)

## 2019-12-19 ENCOUNTER — Encounter: Payer: Managed Care, Other (non HMO) | Admitting: *Deleted

## 2019-12-19 ENCOUNTER — Other Ambulatory Visit: Payer: Self-pay

## 2019-12-19 DIAGNOSIS — Z9889 Other specified postprocedural states: Secondary | ICD-10-CM | POA: Diagnosis not present

## 2019-12-19 NOTE — Progress Notes (Signed)
Daily Session Note  Patient Details  Name: Bonnie Ryan MRN: [id number] Date of Birth: 1955-09-12 Referring Provider:     Cardiac Rehab from 12/05/2019 in [facility] Cardiac and Pulmonary Rehab  Referring Provider  End      Encounter Date: 12/19/2019  Check In: Session Check In - 12/19/19 1118      Check-In   Supervising physician immediately available to respond to emergencies  See telemetry face sheet for immediately available ER MD    Location  [facility]-Cardiac & Pulmonary Rehab    Staff Present  Nada Maclachlan, BA, ACSM CEP, Exercise Physiologist;Maryjane Benedict Sherryll Burger, RN BSN;Joseph Hood RCP,RRT,BSRT    Virtual Visit  No    Medication changes reported      No    Fall or balance concerns reported     No    Warm-up and Cool-down  Performed on first and last piece of equipment    Resistance Training Performed  Yes    VAD Patient?  No    PAD/SET Patient?  No      Pain Assessment   Currently in Pain?  No/denies          Social History   Tobacco Use  Smoking Status Former Smoker  . Packs/day: 0.25  . Years: 20.00  . Pack years: 5.00  . Types: Cigarettes  . Quit date: 07/24/2015  . Years since quitting: 4.4  Smokeless Tobacco Never Used    Goals Met:  Independence with exercise equipment Exercise tolerated well No report of cardiac concerns or symptoms Strength training completed today  Goals Unmet:  Not Applicable  Comments: Pt able to follow exercise prescription today without complaint.  Will continue to monitor for progression.    Dr. Emily Filbert is Medical Director for [place] and [name].

## 2019-12-24 ENCOUNTER — Encounter: Payer: Managed Care, Other (non HMO) | Attending: Internal Medicine | Admitting: *Deleted

## 2019-12-24 ENCOUNTER — Other Ambulatory Visit: Payer: Self-pay

## 2019-12-24 DIAGNOSIS — I5033 Acute on chronic diastolic (congestive) heart failure: Secondary | ICD-10-CM | POA: Diagnosis present

## 2019-12-24 DIAGNOSIS — I38 Endocarditis, valve unspecified: Secondary | ICD-10-CM | POA: Diagnosis not present

## 2019-12-24 DIAGNOSIS — Z9889 Other specified postprocedural states: Secondary | ICD-10-CM | POA: Diagnosis not present

## 2019-12-24 NOTE — Progress Notes (Signed)
Daily Session Note  Patient Details  Name: Bonnie Ryan MRN: [id number] Date of Birth: [birth year] Referring Provider:     Cardiac Rehab from 12/05/2019 in [facility] Cardiac and Pulmonary Rehab  Referring Provider  End      Encounter Date: 12/24/2019  Check In: Session Check In - 12/24/19 1110      Check-In   Supervising physician immediately available to respond to emergencies  See telemetry face sheet for immediately available ER MD    Location  [facility]-Cardiac & Pulmonary Rehab    Staff Present  Renita Papa, RN Moises Blood, BS, ACSM CEP, Exercise Physiologist;Amanda Oletta Darter, [place], ACSM CEP, Exercise Physiologist    Virtual Visit  No    Medication changes reported      No    Fall or balance concerns reported     No    Warm-up and Cool-down  Performed on first and last piece of equipment    Resistance Training Performed  Yes    VAD Patient?  No    PAD/SET Patient?  No      Pain Assessment   Currently in Pain?  No/denies          Social History   Tobacco Use  Smoking Status Former Smoker  . Packs/day: 0.25  . Years: 20.00  . Pack years: 5.00  . Types: Cigarettes  . Quit date: 07/24/2015  . Years since quitting: 4.4  Smokeless Tobacco Never Used    Goals Met:  Independence with exercise equipment Exercise tolerated well Personal goals reviewed No report of cardiac concerns or symptoms Strength training completed today  Goals Unmet:  Not Applicable  Comments: Pt able to follow exercise prescription today without complaint.  Will continue to monitor for progression.    Dr. Emily Filbert is Medical Director for [place] and [name].

## 2019-12-26 ENCOUNTER — Other Ambulatory Visit: Payer: Self-pay

## 2019-12-26 ENCOUNTER — Encounter: Payer: Managed Care, Other (non HMO) | Admitting: *Deleted

## 2019-12-26 ENCOUNTER — Other Ambulatory Visit (HOSPITAL_COMMUNITY): Payer: Self-pay | Admitting: Nurse Practitioner

## 2019-12-26 ENCOUNTER — Other Ambulatory Visit: Payer: Self-pay | Admitting: Family

## 2019-12-26 DIAGNOSIS — I4891 Unspecified atrial fibrillation: Secondary | ICD-10-CM

## 2019-12-26 DIAGNOSIS — I340 Nonrheumatic mitral (valve) insufficiency: Secondary | ICD-10-CM

## 2019-12-26 DIAGNOSIS — I5033 Acute on chronic diastolic (congestive) heart failure: Secondary | ICD-10-CM | POA: Diagnosis not present

## 2019-12-26 DIAGNOSIS — Z9889 Other specified postprocedural states: Secondary | ICD-10-CM

## 2019-12-26 NOTE — Telephone Encounter (Signed)
Spoke w/ pt.  She states that she misunderstood Dr. Barry Dienes' instructions and stopped the warfarin after her visit w/ him. She reports that her last few EKGs have shown NSR and she feels good, so she does not want to resume warfarin.  She will keep her appt on 01/18/20 w Dr. Okey Dupre and call back if we can be of further assistance.  She asks that I not send in the refill on warfarin.

## 2019-12-26 NOTE — Progress Notes (Signed)
Daily Session Note  Patient Details  Name: Tajah Noguchi MRN: [id number] Date of Birth: 16-Jun-1956 Referring Provider:     Cardiac Rehab from 12/05/2019 in [facility] Cardiac and Pulmonary Rehab  Referring Provider  End      Encounter Date: 12/26/2019  Check In: Session Check In - 12/26/19 1154      Check-In   Supervising physician immediately available to respond to emergencies  See telemetry face sheet for immediately available ER MD    Location  [facility]Cardiac [facility] Pulmonary Rehab    Staff Present  Alberteen Sam, MA, RCEP, CCRP, CCET;Amanda Sommer, BA, ACSM CEP, Exercise Physiologist;Joseph Hood RCP,RRT,BSRT   Basilia Jumbo RN, BSN   Virtual Visit  No    Medication changes reported      No    Fall or balance concerns reported     No    Warm-up and Cool-down  Performed on first and last piece of equipment    Resistance Training Performed  Yes    VAD Patient?  No    PAD/SET Patient?  No      Pain Assessment   Currently in Pain?  No/denies          Social History   Tobacco Use  Smoking Status Former Smoker  . Packs/day: 0.25  . Years: 20.00  . Pack years: 5.00  . Types: Cigarettes  . Quit date: 07/24/2015  . Years since quitting: 4.4  Smokeless Tobacco Never Used    Goals Met:  Independence with exercise equipment Exercise tolerated well No report of cardiac concerns or symptoms  Goals Unmet:  Not Applicable  Comments: Pt able to follow exercise prescription today without complaint.  Will continue to monitor for progression.    Dr. Emily Filbert is Medical Director for [place] and [name].

## 2019-12-26 NOTE — Telephone Encounter (Signed)
She has upcoming appointment with Dr. Okey Dupre 01/18/20. Will likely need EKG at that time to confirm maintaining NSR. Last EKG done 10/31/19 showed NSR.   Alver Sorrow, NP

## 2019-12-26 NOTE — Telephone Encounter (Signed)
Prescription refill request received for warfarin. Called and spoke to pt who stated her cardiothoracic surgeon stated that she could stop taking her warfarin.    However, it was never stopped and per Dr. Barry Dienes note from 4/5 the note states      " Consider stopping warfarin if you remain in sinus rhythm when you meet with your cardiologist at your next office visit"  Will send to cardiology provider for verification.

## 2019-12-27 LAB — HM DIABETES EYE EXAM

## 2019-12-28 ENCOUNTER — Encounter: Payer: Managed Care, Other (non HMO) | Admitting: *Deleted

## 2019-12-28 ENCOUNTER — Other Ambulatory Visit: Payer: Self-pay

## 2019-12-28 DIAGNOSIS — Z9889 Other specified postprocedural states: Secondary | ICD-10-CM

## 2019-12-28 DIAGNOSIS — I5033 Acute on chronic diastolic (congestive) heart failure: Secondary | ICD-10-CM | POA: Diagnosis not present

## 2019-12-28 NOTE — Progress Notes (Signed)
Daily Session Note  Patient Details  Name: Makayela Secrest MRN: [id number] Date of Birth: 1956-03-07 Referring Provider:     Cardiac Rehab from 12/05/2019 in [facility] Cardiac and Pulmonary Rehab  Referring Provider  End      Encounter Date: 12/28/2019  Check In: Session Check In - 12/28/19 1109      Check-In   Supervising physician immediately available to respond to emergencies  See telemetry face sheet for immediately available ER MD    Location  [facility]-Cardiac & Pulmonary Rehab    Staff Present  Renita Papa, RN BSN;Joseph [address] [place], [place], [place], CCRP, CCET    Virtual Visit  No    Medication changes reported      No    Fall or balance concerns reported     No    Warm-up and Cool-down  Performed on first and last piece of equipment    Resistance Training Performed  Yes    VAD Patient?  No    PAD/SET Patient?  No      Pain Assessment   Currently in Pain?  No/denies          Social History   Tobacco Use  Smoking Status Former Smoker  . Packs/day: 0.25  . Years: 20.00  . Pack years: 5.00  . Types: Cigarettes  . Quit date: 07/24/2015  . Years since quitting: 4.4  Smokeless Tobacco Never Used    Goals Met:  Independence with exercise equipment Exercise tolerated well No report of cardiac concerns or symptoms Strength training completed today  Goals Unmet:  Not Applicable  Comments: Pt able to follow exercise prescription today without complaint.  Will continue to monitor for progression.    Dr. Emily Filbert is Medical Director for [place] and [name].

## 2019-12-31 ENCOUNTER — Encounter: Payer: Managed Care, Other (non HMO) | Admitting: *Deleted

## 2019-12-31 ENCOUNTER — Other Ambulatory Visit: Payer: Self-pay

## 2019-12-31 ENCOUNTER — Ambulatory Visit: Payer: Managed Care, Other (non HMO) | Admitting: Nurse Practitioner

## 2019-12-31 DIAGNOSIS — I5033 Acute on chronic diastolic (congestive) heart failure: Secondary | ICD-10-CM | POA: Diagnosis not present

## 2019-12-31 DIAGNOSIS — Z9889 Other specified postprocedural states: Secondary | ICD-10-CM

## 2019-12-31 NOTE — Progress Notes (Signed)
Daily Session Note  Patient Details  Name: Bonnie Ryan MRN: [id number] Date of Birth: 1956/05/05 Referring Provider:     Cardiac Rehab from 12/05/2019 in [facility] Cardiac and Pulmonary Rehab  Referring Provider  End      Encounter Date: 12/31/2019  Check In: Session Check In - 12/31/19 1136      Check-In   Supervising physician immediately available to respond to emergencies  See telemetry face sheet for immediately available ER MD    Location  [facility]-Cardiac & Pulmonary Rehab    Staff Present  Renita Papa, RN BSN;Joseph [address] [place], [place], ACSM CEP, Exercise Physiologist    Virtual Visit  No    Medication changes reported      No    Fall or balance concerns reported     No    Warm-up and Cool-down  Performed on first and last piece of equipment    Resistance Training Performed  Yes    VAD Patient?  No    PAD/SET Patient?  No      Pain Assessment   Currently in Pain?  No/denies          Social History   Tobacco Use  Smoking Status Former Smoker  . Packs/day: 0.25  . Years: 20.00  . Pack years: 5.00  . Types: Cigarettes  . Quit date: 07/24/2015  . Years since quitting: 4.4  Smokeless Tobacco Never Used    Goals Met:  Independence with exercise equipment Exercise tolerated well No report of cardiac concerns or symptoms Strength training completed today  Goals Unmet:  Not Applicable  Comments: Pt able to follow exercise prescription today without complaint.  Will continue to monitor for progression.    Dr. Emily Filbert is Medical Director for [place] and [name].

## 2020-01-01 ENCOUNTER — Other Ambulatory Visit: Payer: Self-pay | Admitting: Internal Medicine

## 2020-01-01 ENCOUNTER — Other Ambulatory Visit: Payer: Self-pay | Admitting: Family Medicine

## 2020-01-07 ENCOUNTER — Encounter: Payer: Managed Care, Other (non HMO) | Admitting: *Deleted

## 2020-01-07 ENCOUNTER — Other Ambulatory Visit: Payer: Self-pay

## 2020-01-07 DIAGNOSIS — I5033 Acute on chronic diastolic (congestive) heart failure: Secondary | ICD-10-CM | POA: Diagnosis not present

## 2020-01-07 DIAGNOSIS — Z9889 Other specified postprocedural states: Secondary | ICD-10-CM

## 2020-01-07 NOTE — Progress Notes (Signed)
Daily Session Note  Patient Details  Name: Bonnie Ryan MRN: [id number] Date of Birth: 1955/12/04 Referring Provider:     Cardiac Rehab from 12/05/2019 in [facility] Cardiac and Pulmonary Rehab  Referring Provider  End      Encounter Date: 01/07/2020  Check In: Session Check In - 01/07/20 1129      Check-In   Supervising physician immediately available to respond to emergencies  See telemetry face sheet for immediately available ER MD    Location  [facility]-Cardiac & Pulmonary Rehab    Staff Present  Earlean Shawl, BS, ACSM CEP, Exercise Physiologist;Jaksen Fiorella Sherryll Burger, RN Vickki Hearing, BA, ACSM CEP, Exercise Physiologist    Virtual Visit  No    Medication changes reported      No    Fall or balance concerns reported     No    Warm-up and Cool-down  Performed on first and last piece of equipment    Resistance Training Performed  Yes    VAD Patient?  No    PAD/SET Patient?  No      Pain Assessment   Currently in Pain?  No/denies          Social History   Tobacco Use  Smoking Status Former Smoker  . Packs/day: 0.25  . Years: 20.00  . Pack years: 5.00  . Types: Cigarettes  . Quit date: 07/24/2015  . Years since quitting: 4.4  Smokeless Tobacco Never Used    Goals Met:  Independence with exercise equipment Exercise tolerated well No report of cardiac concerns or symptoms Strength training completed today  Goals Unmet:  Not Applicable  Comments: Pt able to follow exercise prescription today without complaint.  Will continue to monitor for progression.    Dr. Emily Filbert is Medical Director for [name] and [name].

## 2020-01-09 ENCOUNTER — Encounter: Payer: Self-pay | Admitting: *Deleted

## 2020-01-09 DIAGNOSIS — Z9889 Other specified postprocedural states: Secondary | ICD-10-CM

## 2020-01-09 NOTE — Progress Notes (Signed)
Discharge Progress Report  Patient Details  Name: Bonnie Ryan MRN: [id number] Date of Birth: [birth year] Referring Provider:     Cardiac Rehab from 12/05/2019 in [facility] Cardiac and Pulmonary Rehab  Referring Provider  End       Number of Visits: 11  Reason for Discharge:  Early Exit:  Back to work  Smoking History:  Social History   Tobacco Use  Smoking Status Former Smoker  . Packs/day: 0.25  . Years: 20.00  . Pack years: 5.00  . Types: Cigarettes  . Quit date: 07/24/2015  . Years since quitting: 4.4  Smokeless Tobacco Never Used    Diagnosis:  S/P minimally invasive mitral valve repair  ADL UCSD:   Initial Exercise Prescription: Initial Exercise Prescription - 12/05/19 1200      Date of Initial Exercise RX and Referring Provider   Date  12/05/19    Referring Provider  End      Treadmill   MPH  2.5    Grade  0.5    Minutes  15    METs  3.09      NuStep   Level  3    SPM  80    Minutes  15    METs  3      Elliptical   Level  1    Speed  2.5    Minutes  15      T5 Nustep   Level  1    SPM  80    Minutes  15    METs  3      Prescription Details   Frequency (times per week)  3    Duration  Progress to 30 minutes of continuous aerobic without signs/symptoms of physical distress      Intensity   THRR 40-80% of Max Heartrate  115-143    Ratings of Perceived Exertion  11-15    Perceived Dyspnea  0-4      Resistance Training   Training Prescription  Yes    Weight  3 lb    Reps  10-15       Discharge Exercise Prescription (Final Exercise Prescription Changes): Exercise Prescription Changes - 12/26/19 1400      Response to Exercise   Blood Pressure (Admit)  112/60    Blood Pressure (Exercise)  152/74    Blood Pressure (Exit)  116/62    Heart Rate (Admit)  98 bpm    Heart Rate (Exercise)  105 bpm    Heart Rate (Exit)  88 bpm    Rating of Perceived Exertion (Exercise)  13    Symptoms  low BP    Duration  Continue with 30 min of aerobic  exercise without signs/symptoms of physical distress.    Intensity  THRR unchanged      Progression   Progression  Continue to progress workloads to maintain intensity without signs/symptoms of physical distress.    Average METs  3.3      Resistance Training   Training Prescription  Yes    Weight  3 lb    Reps  10-15      Interval Training   Interval Training  No      Treadmill   MPH  2.8    Grade  0.5    Minutes  15    METs  3.34      NuStep   Level  4    SPM  80    Minutes  15    METs  3.3       Functional Capacity: 6 Minute Walk    Row Name 12/05/19 1247         6 Minute Walk   Phase  Initial     Distance  1315 feet     Walk Time  6 minutes     # of Rest Breaks  0     MPH  2.5     METS  3.13     RPE  10     Perceived Dyspnea   1     Symptoms  No     Resting HR  87 bpm     Resting BP  124/78     Resting Oxygen Saturation   96 %     Exercise Oxygen Saturation  during 6 min walk  98 %     Max Ex. HR  99 bpm     Max Ex. BP  124/74     2 Minute Post BP  110/74        Psychological, QOL, Others - Outcomes: PHQ 2/9: Depression screen [facility] 2/9 12/28/2019 12/05/2019 10/22/2019 02/27/2019  Decreased Interest 0 0 0 0  Down, Depressed, Hopeless 0 0 0 0  PHQ - 2 Score 0 0 0 0  Altered sleeping 3 3 0 0  Tired, decreased energy 0 1 0 0  Change in appetite 0 3 0 0  Feeling bad or failure about yourself  0 0 0 0  Trouble concentrating 0 0 0 0  Moving slowly or fidgety/restless 0 0 0 0  Suicidal thoughts 0 0 0 0  PHQ-9 Score 3 7 0 0  Difficult doing work/chores Not difficult at all - Not difficult at all Not difficult at all    Quality of Life: Quality of Life - 12/05/19 1259      Quality of Life   Select  Quality of Life      Quality of Life Scores   Health/Function Pre  26.14 %    Socioeconomic Pre  27.43 %    Psych/Spiritual Pre  25.71 %    Family Pre  30 %    GLOBAL Pre  26.81 %       Nutrition & Weight - Outcomes: Pre Biometrics - 12/05/19 1252       Pre Biometrics   Height  5\' 7"  (1.702 m)    Weight  182 lb 14.4 oz (83 kg)    BMI (Calculated)  28.64    Single Leg Stand  17.72 seconds        Nutrition:   Nutrition Discharge: Nutrition Assessments - 12/05/19 1256      MEDFICTS Scores   Pre Score  3       Education Questionnaire Score: Knowledge Questionnaire Score - 12/05/19 1258      Knowledge Questionnaire Score   Pre Score  25/26

## 2020-01-09 NOTE — Progress Notes (Signed)
Cardiac Individual Treatment Plan  Patient Details  Name: Bertrice Leder MRN: [id number] Date of Birth: [birth year] Referring Provider:     Cardiac Rehab from 12/05/2019 in [facility] Cardiac and Pulmonary Rehab  Referring Provider  End      Initial Encounter Date:    Cardiac Rehab from 12/05/2019 in [facility] Cardiac and Pulmonary Rehab  Date  12/05/19      Visit Diagnosis: S/P minimally invasive mitral valve repair  Patient's Home Medications on Admission:  Current Outpatient Medications:    aspirin EC 81 MG tablet, Take 1 tablet (81 mg total) by mouth daily., Disp:  , Rfl:    furosemide (LASIX) 40 MG tablet, Take 1 tablet (40 mg total) by mouth as needed for fluid or edema., Disp: 90 tablet, Rfl: 1   Insulin Pen Needle (PEN NEEDLES) 32G X 6 MM MISC, 1 application by Does not apply route daily. DX E11.9, Disp: 100 each, Rfl: 0   KLOR-CON M20 20 MEQ tablet, TAKE 1 TABLET BY MOUTH EVERY DAY, Disp: 90 tablet, Rfl: 1   losartan (COZAAR) 25 MG tablet, TAKE 1 TABLET BY MOUTH EVERY DAY, Disp: 90 tablet, Rfl: 0   losartan (COZAAR) 50 MG tablet, Take 25 mg by mouth daily., Disp: , Rfl:    metFORMIN (GLUCOPHAGE) 500 MG tablet, Take 1 tablet (500 mg total) by mouth 2 (two) times daily with a meal., Disp: 180 tablet, Rfl: 3   metoprolol succinate (TOPROL-XL) 25 MG 24 hr tablet, TAKE 1 TABLET (25 MG TOTAL) BY MOUTH DAILY. PATIENT WILL CALL WHEN READY FOR REFILL., Disp: 30 tablet, Rfl: 1   ONETOUCH VERIO test strip, USE AS INSTRUCTED USE TO TEST BLOOD GLUCOSE DAILY. DX E11.9, Disp: 100 strip, Rfl: 3   rosuvastatin (CRESTOR) 20 MG tablet, Take 1 tablet (20 mg total) by mouth at bedtime., Disp: , Rfl:    VICTOZA 18 MG/3ML SOPN, Inject 0.3 mLs (1.8 mg total) into the skin daily., Disp: 5 pen, Rfl: 3   warfarin (COUMADIN) 5 MG tablet, TAKE 1.5 TABLETS ON MONDAYS AND WEDNESDAYS. TAKE 1 TABLET ON ALL OTHER DAYS. AS DIRECTED BY THE COUMADIN CLINIC., Disp: 32 tablet, Rfl: 1   zolpidem (AMBIEN CR) 6.25  MG CR tablet, Take 1 tablet (6.25 mg total) by mouth at bedtime as needed for sleep., Disp: 30 tablet, Rfl: 0  Past Medical History: Past Medical History:  Diagnosis Date   Diabetes mellitus    Previously diet-controlled   Endometriosis    Family history of adverse reaction to anesthesia    mother used to have vomiting after    Hypercholesteremia    Hypertension    Mitral Valve Prolapse w/ Mitral regurgitation    a. 10/2009 Echo: Mod MVP, mild MR; b. 07/2019 Echo: EF 60-65%, Gr2 DD, Mod MR; c. 07/2019 TEE: EF 60-65%. No rwma. Mod dil LA. Sev MR w/ sev bileaflet prolapse. Mild TR; d. 08/2019 s/p minimally invasive MV repair.   Non-obstructive CAD (coronary artery disease)    a. 09/2006 Cath: LAD 30, RCA 25; b.07/2011 Cath: LM Ca2+, 30-60mAD, RCA 30-40p, EF 55-65%, 2+ MR; c. 07/2019 Cath: LM 126m, LAD 5019mI nl, LCX 26m28mA 45p, 29m.36most-op atrial fibrillation (HCC) [place]. 08/2019 following MV repair-->Amio/warfarin initiated.  Initially prolonged QT w/ IV amio-->converted to sinus.   RLS (restless legs syndrome)    S/P minimally invasive mitral valve repair 08/30/2019   a. 08/2019 s/p Complex valvuloplasty including artificial Goretex neochord placement x12 wA25 plication of posterior  leaflet and 40 mm Sorin Memo 4D ring annuloplasty via right mini thoracotomy approach.   UTI (urinary tract infection)     Tobacco Use: Social History   Tobacco Use  Smoking Status Former Smoker   Packs/day: 0.25   Years: 20.00   Pack years: 5.00   Types: Cigarettes   Quit date: 07/24/2015   Years since quitting: 4.4  Smokeless Tobacco Never Used    Labs: Recent Chemical engineer    Labs for ITP Cardiac and Pulmonary Rehab Latest Ref Rng & Units 08/30/2019 08/30/2019 08/30/2019 08/30/2019 11/30/2019   Cholestrol <200 mg/dL - - - - 192   LDLCALC mg/dL (calc) - - - - 91   LDLDIRECT mg/dL - - - - -   HDL > OR = 50 mg/dL - - - - 58   Trlycerides <150 mg/dL - - - - 326(H)   Hemoglobin  A1c <5.7 % of total Hgb - - - - 8.2(H)   PHART 7.350 - 7.450 7.297(L) - 7.362 7.354 -   PCO2ART 32.0 - 48.0 mmHg 43.5 - 39.0 41.7 -   HCO3 20.0 - 28.0 mmol/L 21.2 - 22.1 23.4 -   TCO2 22 - 32 mmol/L '23 23 23 25 ' -   ACIDBASEDEF 0.0 - 2.0 mmol/L 5.0(H) - 3.0(H) 2.0 -   O2SAT % 98.0 - 100.0 97.0 -       Exercise Target Goals: Exercise Program Goal: Individual exercise prescription set using results from initial 6 min walk test and THRR while considering  patients activity barriers and safety.   Exercise Prescription Goal: Initial exercise prescription builds to 30-45 minutes a day of aerobic activity, 2-3 days per week.  Home exercise guidelines will be given to patient during program as part of exercise prescription that the participant will acknowledge.   Education: Aerobic Exercise & Resistance Training: - Gives group verbal and written instruction on the various components of exercise. Focuses on aerobic and resistive training programs and the benefits of this training and how to safely progress through these programs..   Education: Exercise & Equipment Safety: - Individual verbal instruction and demonstration of equipment use and safety with use of the equipment.   Cardiac Rehab from 12/05/2019 in [facility] Cardiac and Pulmonary Rehab  Date  12/05/19  Educator  AS  Instruction Review Code  1- Verbalizes Understanding      Education: Exercise Physiology & General Exercise Guidelines: - Group verbal and written instruction with models to review the exercise physiology of the cardiovascular system and associated critical values. Provides general exercise guidelines with specific guidelines to those with heart or lung disease.    Education: Flexibility, Balance, Mind/Body Relaxation: Provides group verbal/written instruction on the benefits of flexibility and balance training, including mind/body exercise modes such as yoga, pilates and tai chi.  Demonstration and skill practice  provided.   Activity Barriers & Risk Stratification: Activity Barriers & Cardiac Risk Stratification - 12/04/19 1410      Activity Barriers & Cardiac Risk Stratification   Activity Barriers  Deconditioning;Joint Problems;Muscular Weakness;Other (comment);Shortness of Breath;History of Falls    Comments  occasional knee pain (bilateral surgical history), broke ankles last sping    Cardiac Risk Stratification  Low       6 Minute Walk: 6 Minute Walk    Row Name 12/05/19 1247         6 Minute Walk   Phase  Initial     Distance  1315 feet     Walk Time  6 minutes     # of Rest Breaks  0     MPH  2.5     METS  3.13     RPE  10     Perceived Dyspnea   1     Symptoms  No     Resting HR  87 bpm     Resting BP  124/78     Resting Oxygen Saturation   96 %     Exercise Oxygen Saturation  during 6 min walk  98 %     Max Ex. HR  99 bpm     Max Ex. BP  124/74     2 Minute Post BP  110/74        Oxygen Initial Assessment:   Oxygen Re-Evaluation:   Oxygen Discharge (Final Oxygen Re-Evaluation):   Initial Exercise Prescription: Initial Exercise Prescription - 12/05/19 1200      Date of Initial Exercise RX and Referring Provider   Date  12/05/19    Referring Provider  End      Treadmill   MPH  2.5    Grade  0.5    Minutes  15    METs  3.09      NuStep   Level  3    SPM  80    Minutes  15    METs  3      Elliptical   Level  1    Speed  2.5    Minutes  15      T5 Nustep   Level  1    SPM  80    Minutes  15    METs  3      Prescription Details   Frequency (times per week)  3    Duration  Progress to 30 minutes of continuous aerobic without signs/symptoms of physical distress      Intensity   THRR 40-80% of Max Heartrate  115-143    Ratings of Perceived Exertion  11-15    Perceived Dyspnea  0-4      Resistance Training   Training Prescription  Yes    Weight  3 lb    Reps  10-15       Perform Capillary Blood Glucose checks as needed.  Exercise  Prescription Changes: Exercise Prescription Changes    Row Name 12/05/19 1200 12/17/19 1200 12/18/19 1400 12/26/19 1400       Response to Exercise   Blood Pressure (Admit)  124/78  --  110/60  112/60    Blood Pressure (Exercise)  124/74  --  140/80  152/74    Blood Pressure (Exit)  110/74  --  90/60  116/62    Heart Rate (Admit)  87 bpm  --  91 bpm  98 bpm    Heart Rate (Exercise)  99 bpm  --  117 bpm  105 bpm    Heart Rate (Exit)  96 bpm  --  84 bpm  88 bpm    Oxygen Saturation (Admit)  96 %  --  --  --    Oxygen Saturation (Exercise)  98 %  --  --  --    Rating of Perceived Exertion (Exercise)  10  --  13  13    Perceived Dyspnea (Exercise)  1  --  --  --    Symptoms  none  --  fatigue  low BP    Comments  --  --  elliptical ver difficult  --  Duration  --  --  Progress to 30 minutes of  aerobic without signs/symptoms of physical distress  Continue with 30 min of aerobic exercise without signs/symptoms of physical distress.    Intensity  --  --  THRR unchanged  THRR unchanged      Progression   Progression  --  --  Continue to progress workloads to maintain intensity without signs/symptoms of physical distress.  Continue to progress workloads to maintain intensity without signs/symptoms of physical distress.    Average METs  --  --  3.55  3.3      Resistance Training   Training Prescription  --  --  Yes  Yes    Weight  --  --  3 lb  3 lb    Reps  --  --  10-15  10-15      Interval Training   Interval Training  --  --  No  No      Treadmill   MPH  --  --  2.8  2.8    Grade  --  --  0.5  0.5    Minutes  --  --  15  15    METs  --  --  3.09  3.34      NuStep   Level  --  --  --  4    SPM  --  --  --  80    Minutes  --  --  --  15    METs  --  --  --  3.3      Elliptical   Level  --  --  1  --    Speed  --  --  2.5  --    Minutes  --  --  3  --      REL-XR   Level  --  --  1  --    Minutes  --  --  15  --    METs  --  --  4  --      Home Exercise Plan   Plans  to continue exercise at  --  [employer] (comment)  Forensic scientist (comment)  --    Frequency  --  Add 1 additional day to program exercise sessions.  Add 1 additional day to program exercise sessions.  --    Initial Home Exercises Provided  --  12/17/19  12/17/19  --       Exercise Comments:   Exercise Goals and Review: Exercise Goals    Row Name 12/05/19 1251             Exercise Goals   Increase Physical Activity  Yes       Intervention  Provide advice, education, support and counseling about physical activity/exercise needs.;Develop an individualized exercise prescription for aerobic and resistive training based on initial evaluation findings, risk stratification, comorbidities and participant's personal goals.       Expected Outcomes  Short Term: Attend rehab on a regular basis to increase amount of physical activity.;Long Term: Add in home exercise to make exercise part of routine and to increase amount of physical activity.;Long Term: Exercising regularly at least 3-5 days a week.       Increase Strength and Stamina  Yes       Intervention  Provide advice, education, support and counseling about physical activity/exercise needs.;Develop an individualized exercise prescription for aerobic and resistive training based on initial evaluation findings, risk stratification, comorbidities and participant's  personal goals.       Expected Outcomes  Short Term: Increase workloads from initial exercise prescription for resistance, speed, and METs.;Short Term: Perform resistance training exercises routinely during rehab and add in resistance training at home;Long Term: Improve cardiorespiratory fitness, muscular endurance and strength as measured by increased METs and functional capacity (6MWT)       Able to understand and use rate of perceived exertion (RPE) scale  Yes       Intervention  Provide education and explanation on how to use RPE scale       Expected Outcomes  Short Term:  Able to use RPE daily in rehab to express subjective intensity level;Long Term:  Able to use RPE to guide intensity level when exercising independently       Able to understand and use Dyspnea scale  Yes       Intervention  Provide education and explanation on how to use Dyspnea scale       Expected Outcomes  Short Term: Able to use Dyspnea scale daily in rehab to express subjective sense of shortness of breath during exertion;Long Term: Able to use Dyspnea scale to guide intensity level when exercising independently       Knowledge and understanding of Target Heart Rate Range (THRR)  Yes       Intervention  Provide education and explanation of THRR including how the numbers were predicted and where they are located for reference       Expected Outcomes  Short Term: Able to state/look up THRR;Short Term: Able to use daily as guideline for intensity in rehab;Long Term: Able to use THRR to govern intensity when exercising independently       Able to check pulse independently  Yes       Intervention  Provide education and demonstration on how to check pulse in carotid and radial arteries.;Review the importance of being able to check your own pulse for safety during independent exercise       Expected Outcomes  Short Term: Able to explain why pulse checking is important during independent exercise;Long Term: Able to check pulse independently and accurately       Understanding of Exercise Prescription  Yes       Intervention  Provide education, explanation, and written materials on patient's individual exercise prescription       Expected Outcomes  Short Term: Able to explain program exercise prescription;Long Term: Able to explain home exercise prescription to exercise independently          Exercise Goals Re-Evaluation : Exercise Goals Re-Evaluation    Row Name 12/10/19 1135 12/17/19 1243 12/24/19 1127 12/26/19 1457       Exercise Goal Re-Evaluation   Exercise Goals Review  Increase Physical  Activity;Able to understand and use rate of perceived exertion (RPE) scale;Knowledge and understanding of Target Heart Rate Range (THRR);Increase Strength and Stamina;Able to check pulse independently;Understanding of Exercise Prescription  Increase Physical Activity;Increase Strength and Stamina;Able to understand and use rate of perceived exertion (RPE) scale;Able to understand and use Dyspnea scale;Knowledge and understanding of Target Heart Rate Range (THRR);Able to check pulse independently;Understanding of Exercise Prescription  Increase Physical Activity;Increase Strength and Stamina;Able to understand and use rate of perceived exertion (RPE) scale;Able to understand and use Dyspnea scale;Knowledge and understanding of Target Heart Rate Range (THRR);Able to check pulse independently;Understanding of Exercise Prescription  Increase Physical Activity;Increase Strength and Stamina;Able to understand and use rate of perceived exertion (RPE) scale;Able to understand and use Dyspnea scale;Knowledge  and understanding of Target Heart Rate Range (THRR);Able to check pulse independently;Understanding of Exercise Prescription    Comments  Reviewed RPE and dyspnea scales, THR and program prescription with pt today.  Pt voiced understanding and was given a copy of goals to take home.  Reviewed home exercise with pt today.  Pt plans to walk or go to the gym for exercise.  Reviewed THR, pulse, RPE, sign and symptoms, NTG use, and when to call 911 or MD.  Also discussed weather considerations and indoor options.  Pt voiced understanding.  Patient reports that she is walking 2 days a week when not in rehab. She also got some hand weights to be able to start strength training at home. She has increased her speed of walking and her level on the nu-step in class and continues to make improvements in energy levels and strength and stamina.  --    Expected Outcomes  Short: Use RPE daily to regulate intensity. Long: Follow  program prescription in THR.  Short:  monitor HR/RPE when walking at home Long:  maintain exercise independently  Short: start using hand weights at home in addition to walking. Long: Become independent with exercise routine.  --       Discharge Exercise Prescription (Final Exercise Prescription Changes): Exercise Prescription Changes - 12/26/19 1400      Response to Exercise   Blood Pressure (Admit)  112/60    Blood Pressure (Exercise)  152/74    Blood Pressure (Exit)  116/62    Heart Rate (Admit)  98 bpm    Heart Rate (Exercise)  105 bpm    Heart Rate (Exit)  88 bpm    Rating of Perceived Exertion (Exercise)  13    Symptoms  low BP    Duration  Continue with 30 min of aerobic exercise without signs/symptoms of physical distress.    Intensity  THRR unchanged      Progression   Progression  Continue to progress workloads to maintain intensity without signs/symptoms of physical distress.    Average METs  3.3      Resistance Training   Training Prescription  Yes    Weight  3 lb    Reps  10-15      Interval Training   Interval Training  No      Treadmill   MPH  2.8    Grade  0.5    Minutes  15    METs  3.34      NuStep   Level  4    SPM  80    Minutes  15    METs  3.3       Nutrition:  Target Goals: Understanding of nutrition guidelines, daily intake of sodium <155m, cholesterol <2040m calories 30% from fat and 7% or less from saturated fats, daily to have 5 or more servings of fruits and vegetables.  Education: Controlling Sodium/Reading Food Labels -Group verbal and written material supporting the discussion of sodium use in heart healthy nutrition. Review and explanation with models, verbal and written materials for utilization of the food label.   Education: General Nutrition Guidelines/Fats and Fiber: -Group instruction provided by verbal, written material, models and posters to present the general guidelines for heart healthy nutrition. Gives an explanation  and review of dietary fats and fiber.   Biometrics: Pre Biometrics - 12/05/19 1252      Pre Biometrics   Height  '5\' 7"'  (1.702 m)    Weight  182 lb 14.4 oz (  83 kg)    BMI (Calculated)  28.64    Single Leg Stand  17.72 seconds        Nutrition Therapy Plan and Nutrition Goals:   Nutrition Assessments: Nutrition Assessments - 12/05/19 1256      MEDFICTS Scores   Pre Score  3       MEDIFICTS Score Key:          ?70 Need to make dietary changes          40-70 Heart Healthy Diet         ? 40 Therapeutic Level Cholesterol Diet  Nutrition Goals Re-Evaluation:   Nutrition Goals Discharge (Final Nutrition Goals Re-Evaluation):   Psychosocial: Target Goals: Acknowledge presence or absence of significant depression and/or stress, maximize coping skills, provide positive support system. Participant is able to verbalize types and ability to use techniques and skills needed for reducing stress and depression.   Education: Depression - Provides group verbal and written instruction on the correlation between heart/lung disease and depressed mood, treatment options, and the stigmas associated with seeking treatment.   Education: Sleep Hygiene -Provides group verbal and written instruction about how sleep can affect your health.  Define sleep hygiene, discuss sleep cycles and impact of sleep habits. Review good sleep hygiene tips.     Education: Stress and Anxiety: - Provides group verbal and written instruction about the health risks of elevated stress and causes of high stress.  Discuss the correlation between heart/lung disease and anxiety and treatment options. Review healthy ways to manage with stress and anxiety.    Initial Review & Psychosocial Screening: Initial Psych Review & Screening - 12/04/19 1412      Initial Review   Current issues with  Current Sleep Concerns;Current Stress Concerns    Source of Stress Concerns  Chronic Illness;Unable to perform yard/household  activities;Unable to participate in former interests or hobbies    Comments  uses ambien for sleep 4-5 hours a night, recovering from surgery      [place]?  Yes   son and his wife (nurse practition), mother all live close by     Barriers   Psychosocial barriers to participate in program  The patient should benefit from training in stress management and relaxation.;There are no identifiable barriers or psychosocial needs.      Screening Interventions   Interventions  Encouraged to exercise;Provide feedback about the scores to participant;To provide support and resources with identified psychosocial needs    Expected Outcomes  Long Term Goal: Stressors or current issues are controlled or eliminated.;Short Term goal: Utilizing psychosocial counselor, staff and physician to assist with identification of specific Stressors or current issues interfering with healing process. Setting desired goal for each stressor or current issue identified.;Short Term goal: Identification and review with participant of any Quality of Life or Depression concerns found by scoring the questionnaire.;Long Term goal: The participant improves quality of Life and PHQ9 Scores as seen by post scores and/or verbalization of changes       Quality of Life Scores:  Quality of Life - 12/05/19 1259      Quality of Life   Select  Quality of Life      Quality of Life Scores   Health/Function Pre  26.14 %    Socioeconomic Pre  27.43 %    Psych/Spiritual Pre  25.71 %    Family Pre  30 %    GLOBAL Pre  26.81 %  Scores of 19 and below usually indicate a poorer quality of life in these areas.  A difference of  2-3 points is a clinically meaningful difference.  A difference of 2-3 points in the total score of the Quality of Life Index has been associated with significant improvement in overall quality of life, self-image, physical symptoms, and general health in studies assessing change in quality  of life.  PHQ-9: Recent Review Flowsheet Data    Depression screen [facility] 2/9 12/28/2019 12/05/2019 10/22/2019 02/27/2019   Decreased Interest 0 0 0 0   Down, Depressed, Hopeless 0 0 0 0   PHQ - 2 Score 0 0 0 0   Altered sleeping 3 3  0 0   Tired, decreased energy 0 1 0 0   Change in appetite 0 3  0 0   Feeling bad or failure about yourself  0 0 0 0   Trouble concentrating 0 0 0 0   Moving slowly or fidgety/restless 0 0 0 0   Suicidal thoughts 0 0 0 0   PHQ-9 Score 3 7 0 0   Difficult doing work/chores Not difficult at all - Not difficult at all Not difficult at all     Interpretation of Total Score  Total Score Depression Severity:  1-4 = Minimal depression, 5-9 = Mild depression, 10-14 = Moderate depression, 15-19 = Moderately severe depression, 20-27 = Severe depression   Psychosocial Evaluation and Intervention: Psychosocial Evaluation - 12/04/19 1415      Psychosocial Evaluation & Interventions   Interventions  Encouraged to exercise with the program and follow exercise prescription    Comments  Jossilyn is coming  to cardiac rehab after a mitral valve repair.  She has a great support system in her family that lives near by.  She is looking forward to rehab to get herself back.  She has been dealing with a leaky valve since her 68s and has always gotten by up until last year when her symptoms started to interfere with her life.  She gave up going to the gym after work with friends as she just was not able to go anymore.  She really wants to get her stamina back and be able to go again.  She also wants to make sure her heart is back in working order.    Expected Outcomes  Short: Attend rehab to get back to exercise Long: Build stamina back up to be able to go again.    Continue Psychosocial Services   Follow up required by staff       Psychosocial Re-Evaluation: Psychosocial Re-Evaluation    [place] Name 12/24/19 1138 12/28/19 1106           Psychosocial Re-Evaluation   Current issues  with  Current Sleep Concerns;Current Stress Concerns  Current Sleep Concerns;Current Stress Concerns      Comments  Patient reports that she still does not sleep very well at night. She wakes up a lot thoughout the night. She has worked with her doctor and has sleep aids to use when needed. She does not feel drained during the day however and is able to function well during the day.  Reviewed patient health questionnaire (PHQ-9) with patient for follow up. Previously, patients score indicated signs/symptoms of depression.  Reviewed to see if patient is improving symptom wise while in program.  Score improved and patient states that it is because she has been having more energy due to able to exercise more. She has been interviewing  for a new job recently and may not be able to finish the program. She is going to let us know if she gets a new job and try to finish the program completely.      Expected Outcomes  Short: Keep working with doctor and working on establishing a good sleeping routine/pattern. Long: maintain good mental health and stress managment.  Short: Continue to attend [facility] regularly for regular exercise and social engagement. Long: Continue to improve symptoms and manage a positive mental state.      Interventions  Encouraged to attend Cardiac Rehabilitation for the exercise  Encouraged to attend Cardiac Rehabilitation for the exercise      Continue Psychosocial Services   Follow up required by staff  No Follow up required         Psychosocial Discharge (Final Psychosocial Re-Evaluation): Psychosocial Re-Evaluation - 12/28/19 1106      Psychosocial Re-Evaluation   Current issues with  Current Sleep Concerns;Current Stress Concerns    Comments  Reviewed patient health questionnaire (PHQ-9) with patient for follow up. Previously, patients score indicated signs/symptoms of depression.  Reviewed to see if patient is improving symptom wise while in program.  Score improved and  patient states that it is because she has been having more energy due to able to exercise more. She has been interviewing for a new job recently and may not be able to finish the program. She is going to let us know if she gets a new job and try to finish the program completely.    Expected Outcomes  Short: Continue to attend [facility] regularly for regular exercise and social engagement. Long: Continue to improve symptoms and manage a positive mental state.    Interventions  Encouraged to attend Cardiac Rehabilitation for the exercise    Continue Psychosocial Services   No Follow up required       Vocational Rehabilitation: Provide vocational rehab assistance to qualifying candidates.   Vocational Rehab Evaluation & Intervention: Vocational Rehab - 12/04/19 1412      Initial Vocational Rehab Evaluation & Intervention   Assessment shows need for Vocational Rehabilitation  Yes   still working      Education: Education Goals: Education classes will be provided on a variety of topics geared toward better understanding of heart health and risk factor modification. Participant will state understanding/return demonstration of topics presented as noted by education test scores.  Learning Barriers/Preferences: Learning Barriers/Preferences - 12/04/19 1411      Learning Barriers/Preferences   Learning Barriers  Sight   glasses (needs new Rx)   Learning Preferences  Skilled Demonstration;Verbal Instruction       General Cardiac Education Topics:  AED/CPR: - Group verbal and written instruction with the use of models to demonstrate the basic use of the AED with the basic ABC's of resuscitation.   Anatomy & Physiology of the Heart: - Group verbal and written instruction and models provide basic cardiac anatomy and physiology, with the coronary electrical and arterial systems. Review of Valvular disease and Heart Failure   Cardiac Procedures: - Group verbal and written  instruction to review commonly prescribed medications for heart disease. Reviews the medication, class of the drug, and side effects. Includes the steps to properly store meds and maintain the prescription regimen. (beta blockers and nitrates)   Cardiac Medications I: - Group verbal and written instruction to review commonly prescribed medications for heart disease. Reviews the medication, class of the drug, and side effects. Includes the steps to properly store  meds and maintain the prescription regimen.   Cardiac Medications II: -Group verbal and written instruction to review commonly prescribed medications for heart disease. Reviews the medication, class of the drug, and side effects. (all other drug classes)    Go Sex-Intimacy & Heart Disease, Get SMART - Goal Setting: - Group verbal and written instruction through game format to discuss heart disease and the return to sexual intimacy. Provides group verbal and written material to discuss and apply goal setting through the application of the S.M.A.R.T. Method.   Other Matters of the Heart: - Provides group verbal, written materials and models to describe Stable Angina and Peripheral Artery. Includes description of the disease process and treatment options available to the cardiac patient.   Infection Prevention: - Provides verbal and written material to individual with discussion of infection control including proper hand washing and proper equipment cleaning during exercise session.   Cardiac Rehab from 12/05/2019 in [facility]iac [facility] Re[facility]  Date  12/05/19  Educator  AS  Instruction Review Code  1- Verbalizes Understanding      Falls Prevention: - Provides verbal and written material to individual with discussion of falls prevention and safety.   Cardiac Rehab from 12/05/2019 in [facility] Cardiac and Pulmonary Rehab  Date  12/05/19  Educator  AS  Instruction Review Code  1- Verbalizes Understanding      Other: -Provides  group and verbal instruction on various topics (see comments)   Knowledge Questionnaire Score: Knowledge Questionnaire Score - 12/05/19 1258      Knowledge Questionnaire Score   Pre Score  25/26       Core Components/Risk Factors/Patient Goals at Admission: Personal Goals and Risk Factors at Admission - 12/05/19 1253      Core Components/Risk Factors/Patient Goals on Admission    Weight Management  Yes;Weight Loss;Obesity    Intervention  Weight Management: Develop a combined nutrition and exercise program designed to reach desired caloric intake, while maintaining appropriate intake of nutrient and fiber, sodium and fats, and appropriate energy expenditure required for the weight goal.;Weight Management: Provide education and appropriate resources to help participant work on and attain dietary goals.;Obesity: Provide education and appropriate resources to help participant work on and attain dietary goals.;Weight Management/Obesity: Establish reasonable short term and long term weight goals.    Admit Weight  182 lb 14.4 oz (83 kg)    Goal Weight: Short Term  172 lb (78 kg)    Goal Weight: Long Term  162 lb (73.5 kg)    Expected Outcomes  Short Term: Continue to assess and modify interventions until short term weight is achieved;Long Term: Adherence to nutrition and physical activity/exercise program aimed toward attainment of established weight goal;Weight Loss: Understanding of general recommendations for a balanced deficit meal plan, which promotes 1-2 lb weight loss per week and includes a negative energy balance of [phone] kcal/d;Understanding recommendations for meals to include 15-35% energy as protein, 25-35% energy from fat, 35-60% energy from carbohydrates, less than 232m of dietary cholesterol, 20-35 gm of total fiber daily;Understanding of distribution of calorie intake throughout the day with the consumption of 4-5 meals/snacks    Intervention  Provide education about  signs/symptoms and action to take for hypo/hyperglycemia.;Provide education about proper nutrition, including hydration, and aerobic/resistive exercise prescription along with prescribed medications to achieve blood glucose in normal ranges: Fasting glucose 65-99 mg/dL    Expected Outcomes  Short Term: Participant verbalizes understanding of the signs/symptoms and immediate care of hyper/hypoglycemia, proper foot care and importance  of medication, aerobic/resistive exercise and nutrition plan for blood glucose control.;Long Term: Attainment of HbA1C < 7%.    Intervention  Provide education on lifestyle modifcations including regular physical activity/exercise, weight management, moderate sodium restriction and increased consumption of fresh fruit, vegetables, and low fat dairy, alcohol moderation, and smoking cessation.;Monitor prescription use compliance.    Expected Outcomes  Short Term: Continued assessment and intervention until BP is < 140/74m HG in hypertensive participants. < 130/875mHG in hypertensive participants with diabetes, heart failure or chronic kidney disease.;Long Term: Maintenance of blood pressure at goal levels.    Intervention  Provide education and support for participant on nutrition & aerobic/resistive exercise along with prescribed medications to achieve LDL <7064mHDL >64m68m  Expected Outcomes  Short Term: Participant states understanding of desired cholesterol values and is compliant with medications prescribed. Participant is following exercise prescription and nutrition guidelines.;Long Term: Cholesterol controlled with medications as prescribed, with individualized exercise RX and with personalized nutrition plan. Value goals: LDL < 70mg41mL > 40 mg.       Education:Diabetes - Individual verbal and written instruction to review signs/symptoms of diabetes, desired ranges of glucose level fasting, after meals and with exercise. Acknowledge that pre and post exercise  glucose checks will be done for 3 sessions at entry of program.   Education: Know Your Numbers and Risk Factors: -Group verbal and written instruction about important numbers in your health.  Discussion of what are risk factors and how they play a role in the disease process.  Review of Cholesterol, Blood Pressure, Diabetes, and BMI and the role they play in your overall health.   Core Components/Risk Factors/Patient Goals Review:  Goals and Risk Factor Review    Row Name 12/24/19 1132             Core Components/Risk Factors/Patient Goals Review   Personal Goals Review  Weight Management/Obesity;Lipids;Diabetes       Review  Patient reports taking all meds as prescribed and monitoring blood sugars at home. Weight has been steady with no large increases or decreases. Patient has not yet met with program dietician.       Expected Outcomes  Short: set up appointment with program dietician. Long: continue to control risk factors with health healthy life style.          Core Components/Risk Factors/Patient Goals at Discharge (Final Review):  Goals and Risk Factor Review - 12/24/19 1132      Core Components/Risk Factors/Patient Goals Review   Personal Goals Review  Weight Management/Obesity;Lipids;Diabetes    Review  Patient reports taking all meds as prescribed and monitoring blood sugars at home. Weight has been steady with no large increases or decreases. Patient has not yet met with program dietician.    Expected Outcomes  Short: set up appointment with program dietician. Long: continue to control risk factors with health healthy life style.       ITP Comments: ITP Comments    Row Name 12/04/19 1439 12/05/19 1310 12/10/19 1134 12/12/19 0529 01/09/20 0550   ITP Comments  Completed virtual orientation today.  EP evaluation is scheduled for Wednesday 4/14 at 1130am.  [facility]  Completed 6MWT and gym orientation.  Initial ITP  created and sent for review to Dr. Mark Emily Filbertical Director.  First full day of exercise!  Patient was oriented to gym and equipment including functions, settings, policies, and procedures.  Patient's individual exercise prescription and treatment plan  were reviewed.  All starting workloads were established based on the results of the 6 minute walk test done at initial orientation visit.  The plan for exercise progression was also introduced and progression will be customized based on patient's performance and goals.  30 Day review completed. Medical Director review done, changes made as directed,and approval shown by signature of Market researcher.  New to program  30 Day review completed. ITP review done, changes made as directed,and approval shown by signature of  Scientist, research (life sciences).      Comments:

## 2020-01-09 NOTE — Progress Notes (Signed)
Cardiac Individual Treatment Plan  Patient Details  Name: Bonnie Ryan MRN: [id number] Date of Birth: 1955/12/13 Referring Provider:     Cardiac Rehab from 12/05/2019 in [facility] Cardiac and Pulmonary Rehab  Referring Provider  End      Initial Encounter Date:    Cardiac Rehab from 12/05/2019 in [facility] Cardiac and Pulmonary Rehab  Date  12/05/19      Visit Diagnosis: S/P minimally invasive mitral valve repair  Patient's Home Medications on Admission:  Current Outpatient Medications:  .  aspirin EC 81 MG tablet, Take 1 tablet (81 mg total) by mouth daily., Disp:  , Rfl:  .  furosemide (LASIX) 40 MG tablet, Take 1 tablet (40 mg total) by mouth as needed for fluid or edema., Disp: 90 tablet, Rfl: 1 .  Insulin Pen Needle (PEN NEEDLES) 32G X 6 MM MISC, 1 application by Does not apply route daily. DX E11.9, Disp: 100 each, Rfl: 0 .  KLOR-CON M20 20 MEQ tablet, TAKE 1 TABLET BY MOUTH EVERY DAY, Disp: 90 tablet, Rfl: 1 .  losartan (COZAAR) 25 MG tablet, TAKE 1 TABLET BY MOUTH EVERY DAY, Disp: 90 tablet, Rfl: 0 .  losartan (COZAAR) 50 MG tablet, Take 25 mg by mouth daily., Disp: , Rfl:  .  metFORMIN (GLUCOPHAGE) 500 MG tablet, Take 1 tablet (500 mg total) by mouth 2 (two) times daily with a meal., Disp: 180 tablet, Rfl: 3 .  metoprolol succinate (TOPROL-XL) 25 MG 24 hr tablet, TAKE 1 TABLET (25 MG TOTAL) BY MOUTH DAILY. PATIENT WILL CALL WHEN READY FOR REFILL., Disp: 30 tablet, Rfl: 1 .  ONETOUCH VERIO test strip, USE AS INSTRUCTED USE TO TEST BLOOD GLUCOSE DAILY. DX E11.9, Disp: 100 strip, Rfl: 3 .  rosuvastatin (CRESTOR) 20 MG tablet, Take 1 tablet (20 mg total) by mouth at bedtime., Disp: , Rfl:  .  VICTOZA 18 MG/3ML SOPN, Inject 0.3 mLs (1.8 mg total) into the skin daily., Disp: 5 pen, Rfl: 3 .  warfarin (COUMADIN) 5 MG tablet, TAKE 1.5 TABLETS ON MONDAYS AND WEDNESDAYS. TAKE 1 TABLET ON ALL OTHER DAYS. AS DIRECTED BY THE COUMADIN CLINIC., Disp: 32 tablet, Rfl: 1 .  zolpidem (AMBIEN CR) 6.25  MG CR tablet, Take 1 tablet (6.25 mg total) by mouth at bedtime as needed for sleep., Disp: 30 tablet, Rfl: 0  Past Medical History: Past Medical History:  Diagnosis Date  . Diabetes mellitus    Previously diet-controlled  . Endometriosis   . Family history of adverse reaction to anesthesia    mother used to have vomiting after   . Hypercholesteremia   . Hypertension   . Mitral Valve Prolapse w/ Mitral regurgitation    a. 10/2009 Echo: Mod MVP, mild MR; b. 07/2019 Echo: EF 60-65%, Gr2 DD, Mod MR; c. 07/2019 TEE: EF 60-65%. No rwma. Mod dil LA. Sev MR w/ sev bileaflet prolapse. Mild TR; d. 08/2019 s/p minimally invasive MV repair.  . Non-obstructive CAD (coronary artery disease)    a. 09/2006 Cath: LAD 30, RCA 25; b.07/2011 Cath: LM Ca2+, 30-51mAD, RCA 30-40p, EF 55-65%, 2+ MR; c. 07/2019 Cath: LM 14m, LAD 5032mI nl, LCX 69m68mA 45p, 36m.44mPosMarland Kitchen-op atrial fibrillation (HCC) [place]. 08/2019 following MV repair-->Amio/warfarin initiated.  Initially prolonged QT w/ IV amio-->converted to sinus.  . RLS (restless legs syndrome)   . S/P minimally invasive mitral valve repair 08/30/2019   a. 08/2019 s/p Complex valvuloplasty including artificial Goretex neochord placement x12 wE08 plication of posterior  leaflet and 40 mm Sorin Memo 4D ring annuloplasty via right mini thoracotomy approach.  Marland Kitchen UTI (urinary tract infection)     Tobacco Use: Social History   Tobacco Use  Smoking Status Former Smoker  . Packs/day: 0.25  . Years: 20.00  . Pack years: 5.00  . Types: Cigarettes  . Quit date: 07/24/2015  . Years since quitting: 4.4  Smokeless Tobacco Never Used    Labs: Recent Chemical engineer    Labs for ITP Cardiac and Pulmonary Rehab Latest Ref Rng & Units 08/30/2019 08/30/2019 08/30/2019 08/30/2019 11/30/2019   Cholestrol <200 mg/dL - - - - 192   LDLCALC mg/dL (calc) - - - - 91   LDLDIRECT mg/dL - - - - -   HDL > OR = 50 mg/dL - - - - 58   Trlycerides <150 mg/dL - - - - 326(H)   Hemoglobin  A1c <5.7 % of total Hgb - - - - 8.2(H)   PHART 7.350 - 7.450 7.297(L) - 7.362 7.354 -   PCO2ART 32.0 - 48.0 mmHg 43.5 - 39.0 41.7 -   HCO3 20.0 - 28.0 mmol/L 21.2 - 22.1 23.4 -   TCO2 22 - 32 mmol/L _0 -   ACIDBASEDEF 0.0 - 2.0 mmol/L 5.0(H) - 3.0(H) 2.0 -   O2SAT % 98.0 - 100.0 97.0 -       Exercise Target Goals: Exercise Program Goal: Individual exercise prescription set using results from initial 6 min walk test and THRR while considering  patient's activity barriers and safety.   Exercise Prescription Goal: Initial exercise prescription builds to 30-45 minutes a day of aerobic activity, 2-3 days per week.  Home exercise guidelines will be given to patient during program as part of exercise prescription that the participant will acknowledge.   Education: Aerobic Exercise & Resistance Training: - Gives group verbal and written instruction on the various components of exercise. Focuses on aerobic and resistive training programs and the benefits of this training and how to safely progress through these programs..   Education: Exercise & Equipment Safety: - Individual verbal instruction and demonstration of equipment use and safety with use of the equipment.   Cardiac Rehab from 12/05/2019 in [facility] Cardiac and Pulmonary Rehab  Date  12/05/19  Educator  AS  Instruction Review Code  1- Verbalizes Understanding      Education: Exercise Physiology & General Exercise Guidelines: - Group verbal and written instruction with models to review the exercise physiology of the cardiovascular system and associated critical values. Provides general exercise guidelines with specific guidelines to those with heart or lung disease.    Education: Flexibility, Balance, Mind/Body Relaxation: Provides group verbal/written instruction on the benefits of flexibility and balance training, including mind/body exercise modes such as yoga, pilates and tai chi.  Demonstration and skill practice  provided.   Activity Barriers & Risk Stratification: Activity Barriers & Cardiac Risk Stratification - 12/04/19 1410      Activity Barriers & Cardiac Risk Stratification   Activity Barriers  Deconditioning;Joint Problems;Muscular Weakness;Other (comment);Shortness of Breath;History of Falls    Comments  occasional knee pain (bilateral surgical history), broke ankles last sping    Cardiac Risk Stratification  Low       6 Minute Walk: 6 Minute Walk    Row Name 12/05/19 1247         6 Minute Walk   Phase  Initial     Distance  1315 feet     Walk Time  6 minutes     # of Rest Breaks  0     MPH  2.5     METS  3.13     RPE  10     Perceived Dyspnea   1     Symptoms  No     Resting HR  87 bpm     Resting BP  124/78     Resting Oxygen Saturation   96 %     Exercise Oxygen Saturation  during 6 min walk  98 %     Max Ex. HR  99 bpm     Max Ex. BP  124/74     2 Minute Post BP  110/74        Oxygen Initial Assessment:   Oxygen Re-Evaluation:   Oxygen Discharge (Final Oxygen Re-Evaluation):   Initial Exercise Prescription: Initial Exercise Prescription - 12/05/19 1200      Date of Initial Exercise RX and Referring Provider   Date  12/05/19    Referring Provider  End      Treadmill   MPH  2.5    Grade  0.5    Minutes  15    METs  3.09      NuStep   Level  3    SPM  80    Minutes  15    METs  3      Elliptical   Level  1    Speed  2.5    Minutes  15      T5 Nustep   Level  1    SPM  80    Minutes  15    METs  3      Prescription Details   Frequency (times per week)  3    Duration  Progress to 30 minutes of continuous aerobic without signs/symptoms of physical distress      Intensity   THRR 40-80% of Max Heartrate  115-143    Ratings of Perceived Exertion  11-15    Perceived Dyspnea  0-4      Resistance Training   Training Prescription  Yes    Weight  3 lb    Reps  10-15       Perform Capillary Blood Glucose checks as needed.  Exercise  Prescription Changes: Exercise Prescription Changes    Row Name 12/05/19 1200 12/17/19 1200 12/18/19 1400 12/26/19 1400       Response to Exercise   Blood Pressure (Admit)  124/78  --  110/60  112/60    Blood Pressure (Exercise)  124/74  --  140/80  152/74    Blood Pressure (Exit)  110/74  --  90/60  116/62    Heart Rate (Admit)  87 bpm  --  91 bpm  98 bpm    Heart Rate (Exercise)  99 bpm  --  117 bpm  105 bpm    Heart Rate (Exit)  96 bpm  --  84 bpm  88 bpm    Oxygen Saturation (Admit)  96 %  --  --  --    Oxygen Saturation (Exercise)  98 %  --  --  --    Rating of Perceived Exertion (Exercise)  10  --  13  13    Perceived Dyspnea (Exercise)  1  --  --  --    Symptoms  none  --  fatigue  low BP    Comments  --  --  elliptical ver difficult  --  Duration  --  --  Progress to 30 minutes of  aerobic without signs/symptoms of physical distress  Continue with 30 min of aerobic exercise without signs/symptoms of physical distress.    Intensity  --  --  THRR unchanged  THRR unchanged      Progression   Progression  --  --  Continue to progress workloads to maintain intensity without signs/symptoms of physical distress.  Continue to progress workloads to maintain intensity without signs/symptoms of physical distress.    Average METs  --  --  3.55  3.3      Resistance Training   Training Prescription  --  --  Yes  Yes    Weight  --  --  3 lb  3 lb    Reps  --  --  10-15  10-15      Interval Training   Interval Training  --  --  No  No      Treadmill   MPH  --  --  2.8  2.8    Grade  --  --  0.5  0.5    Minutes  --  --  15  15    METs  --  --  3.09  3.34      NuStep   Level  --  --  --  4    SPM  --  --  --  80    Minutes  --  --  --  15    METs  --  --  --  3.3      Elliptical   Level  --  --  1  --    Speed  --  --  2.5  --    Minutes  --  --  3  --      REL-XR   Level  --  --  1  --    Minutes  --  --  15  --    METs  --  --  4  --      Home Exercise Plan   Plans  to continue exercise at  --  [employer] (comment)  Forensic scientist (comment)  --    Frequency  --  Add 1 additional day to program exercise sessions.  Add 1 additional day to program exercise sessions.  --    Initial Home Exercises Provided  --  12/17/19  12/17/19  --       Exercise Comments:   Exercise Goals and Review: Exercise Goals    Row Name 12/05/19 1251             Exercise Goals   Increase Physical Activity  Yes       Intervention  Provide advice, education, support and counseling about physical activity/exercise needs.;Develop an individualized exercise prescription for aerobic and resistive training based on initial evaluation findings, risk stratification, comorbidities and participant's personal goals.       Expected Outcomes  Short Term: Attend rehab on a regular basis to increase amount of physical activity.;Long Term: Add in home exercise to make exercise part of routine and to increase amount of physical activity.;Long Term: Exercising regularly at least 3-5 days a week.       Increase Strength and Stamina  Yes       Intervention  Provide advice, education, support and counseling about physical activity/exercise needs.;Develop an individualized exercise prescription for aerobic and resistive training based on initial evaluation findings, risk stratification, comorbidities and participant's  personal goals.       Expected Outcomes  Short Term: Increase workloads from initial exercise prescription for resistance, speed, and METs.;Short Term: Perform resistance training exercises routinely during rehab and add in resistance training at home;Long Term: Improve cardiorespiratory fitness, muscular endurance and strength as measured by increased METs and functional capacity (6MWT)       Able to understand and use rate of perceived exertion (RPE) scale  Yes       Intervention  Provide education and explanation on how to use RPE scale       Expected Outcomes  Short Term:  Able to use RPE daily in rehab to express subjective intensity level;Long Term:  Able to use RPE to guide intensity level when exercising independently       Able to understand and use Dyspnea scale  Yes       Intervention  Provide education and explanation on how to use Dyspnea scale       Expected Outcomes  Short Term: Able to use Dyspnea scale daily in rehab to express subjective sense of shortness of breath during exertion;Long Term: Able to use Dyspnea scale to guide intensity level when exercising independently       Knowledge and understanding of Target Heart Rate Range (THRR)  Yes       Intervention  Provide education and explanation of THRR including how the numbers were predicted and where they are located for reference       Expected Outcomes  Short Term: Able to state/look up THRR;Short Term: Able to use daily as guideline for intensity in rehab;Long Term: Able to use THRR to govern intensity when exercising independently       Able to check pulse independently  Yes       Intervention  Provide education and demonstration on how to check pulse in carotid and radial arteries.;Review the importance of being able to check your own pulse for safety during independent exercise       Expected Outcomes  Short Term: Able to explain why pulse checking is important during independent exercise;Long Term: Able to check pulse independently and accurately       Understanding of Exercise Prescription  Yes       Intervention  Provide education, explanation, and written materials on patient's individual exercise prescription       Expected Outcomes  Short Term: Able to explain program exercise prescription;Long Term: Able to explain home exercise prescription to exercise independently          Exercise Goals Re-Evaluation : Exercise Goals Re-Evaluation    Row Name 12/10/19 1135 12/17/19 1243 12/24/19 1127 12/26/19 1457       Exercise Goal Re-Evaluation   Exercise Goals Review  Increase Physical  Activity;Able to understand and use rate of perceived exertion (RPE) scale;Knowledge and understanding of Target Heart Rate Range (THRR);Increase Strength and Stamina;Able to check pulse independently;Understanding of Exercise Prescription  Increase Physical Activity;Increase Strength and Stamina;Able to understand and use rate of perceived exertion (RPE) scale;Able to understand and use Dyspnea scale;Knowledge and understanding of Target Heart Rate Range (THRR);Able to check pulse independently;Understanding of Exercise Prescription  Increase Physical Activity;Increase Strength and Stamina;Able to understand and use rate of perceived exertion (RPE) scale;Able to understand and use Dyspnea scale;Knowledge and understanding of Target Heart Rate Range (THRR);Able to check pulse independently;Understanding of Exercise Prescription  Increase Physical Activity;Increase Strength and Stamina;Able to understand and use rate of perceived exertion (RPE) scale;Able to understand and use Dyspnea scale;Knowledge  and understanding of Target Heart Rate Range (THRR);Able to check pulse independently;Understanding of Exercise Prescription    Comments  Reviewed RPE and dyspnea scales, THR and program prescription with pt today.  Pt voiced understanding and was given a copy of goals to take home.  Reviewed home exercise with pt today.  Pt plans to walk or go to the gym for exercise.  Reviewed THR, pulse, RPE, sign and symptoms, NTG use, and when to call 911 or MD.  Also discussed weather considerations and indoor options.  Pt voiced understanding.  Patient reports that she is walking 2 days a week when not in rehab. She also got some hand weights to be able to start strength training at home. She has increased her speed of walking and her level on the nu-step in class and continues to make improvements in energy levels and strength and stamina.  --    Expected Outcomes  Short: Use RPE daily to regulate intensity. Long: Follow  program prescription in THR.  Short:  monitor HR/RPE when walking at home Long:  maintain exercise independently  Short: start using hand weights at home in addition to walking. Long: Become independent with exercise routine.  --       Discharge Exercise Prescription (Final Exercise Prescription Changes): Exercise Prescription Changes - 12/26/19 1400      Response to Exercise   Blood Pressure (Admit)  112/60    Blood Pressure (Exercise)  152/74    Blood Pressure (Exit)  116/62    Heart Rate (Admit)  98 bpm    Heart Rate (Exercise)  105 bpm    Heart Rate (Exit)  88 bpm    Rating of Perceived Exertion (Exercise)  13    Symptoms  low BP    Duration  Continue with 30 min of aerobic exercise without signs/symptoms of physical distress.    Intensity  THRR unchanged      Progression   Progression  Continue to progress workloads to maintain intensity without signs/symptoms of physical distress.    Average METs  3.3      Resistance Training   Training Prescription  Yes    Weight  3 lb    Reps  10-15      Interval Training   Interval Training  No      Treadmill   MPH  2.8    Grade  0.5    Minutes  15    METs  3.34      NuStep   Level  4    SPM  80    Minutes  15    METs  3.3       Nutrition:  Target Goals: Understanding of nutrition guidelines, daily intake of sodium <1533m, cholesterol <2050m calories 30% from fat and 7% or less from saturated fats, daily to have 5 or more servings of fruits and vegetables.  Education: Controlling Sodium/Reading Food Labels -Group verbal and written material supporting the discussion of sodium use in heart healthy nutrition. Review and explanation with models, verbal and written materials for utilization of the food label.   Education: General Nutrition Guidelines/Fats and Fiber: -Group instruction provided by verbal, written material, models and posters to present the general guidelines for heart healthy nutrition. Gives an explanation  and review of dietary fats and fiber.   Biometrics: Pre Biometrics - 12/05/19 1252      Pre Biometrics   Height  _0  (1.702 m)    Weight  182 lb 14.4 oz (  83 kg)    BMI (Calculated)  28.64    Single Leg Stand  17.72 seconds        Nutrition Therapy Plan and Nutrition Goals:   Nutrition Assessments: Nutrition Assessments - 12/05/19 1256      MEDFICTS Scores   Pre Score  3       MEDIFICTS Score Key:          ?70 Need to make dietary changes          40-70 Heart Healthy Diet         ? 40 Therapeutic Level Cholesterol Diet  Nutrition Goals Re-Evaluation:   Nutrition Goals Discharge (Final Nutrition Goals Re-Evaluation):   Psychosocial: Target Goals: Acknowledge presence or absence of significant depression and/or stress, maximize coping skills, provide positive support system. Participant is able to verbalize types and ability to use techniques and skills needed for reducing stress and depression.   Education: Depression - Provides group verbal and written instruction on the correlation between heart/lung disease and depressed mood, treatment options, and the stigmas associated with seeking treatment.   Education: Sleep Hygiene -Provides group verbal and written instruction about how sleep can affect your health.  Define sleep hygiene, discuss sleep cycles and impact of sleep habits. Review good sleep hygiene tips.     Education: Stress and Anxiety: - Provides group verbal and written instruction about the health risks of elevated stress and causes of high stress.  Discuss the correlation between heart/lung disease and anxiety and treatment options. Review healthy ways to manage with stress and anxiety.    Initial Review & Psychosocial Screening: Initial Psych Review & Screening - 12/04/19 1412      Initial Review   Current issues with  Current Sleep Concerns;Current Stress Concerns    Source of Stress Concerns  Chronic Illness;Unable to perform yard/household  activities;Unable to participate in former interests or hobbies    Comments  uses ambien for sleep 4-5 hours a night, recovering from surgery      [place]?  Yes   son and his wife (nurse practition), mother all live close by     Barriers   Psychosocial barriers to participate in program  The patient should benefit from training in stress management and relaxation.;There are no identifiable barriers or psychosocial needs.      Screening Interventions   Interventions  Encouraged to exercise;Provide feedback about the scores to participant;To provide support and resources with identified psychosocial needs    Expected Outcomes  Long Term Goal: Stressors or current issues are controlled or eliminated.;Short Term goal: Utilizing psychosocial counselor, staff and physician to assist with identification of specific Stressors or current issues interfering with healing process. Setting desired goal for each stressor or current issue identified.;Short Term goal: Identification and review with participant of any Quality of Life or Depression concerns found by scoring the questionnaire.;Long Term goal: The participant improves quality of Life and PHQ9 Scores as seen by post scores and/or verbalization of changes       Quality of Life Scores:  Quality of Life - 12/05/19 1259      Quality of Life   Select  Quality of Life      Quality of Life Scores   Health/Function Pre  26.14 %    Socioeconomic Pre  27.43 %    Psych/Spiritual Pre  25.71 %    Family Pre  30 %    GLOBAL Pre  26.81 %  Scores of 19 and below usually indicate a poorer quality of life in these areas.  A difference of  2-3 points is a clinically meaningful difference.  A difference of 2-3 points in the total score of the Quality of Life Index has been associated with significant improvement in overall quality of life, self-image, physical symptoms, and general health in studies assessing change in quality  of life.  PHQ-9: Recent Review Flowsheet Data    Depression screen [facility] 2/9 12/28/2019 12/05/2019 10/22/2019 02/27/2019   Decreased Interest 0 0 0 0   Down, Depressed, Hopeless 0 0 0 0   PHQ - 2 Score 0 0 0 0   Altered sleeping 3 3  0 0   Tired, decreased energy 0 1 0 0   Change in appetite 0 3  0 0   Feeling bad or failure about yourself  0 0 0 0   Trouble concentrating 0 0 0 0   Moving slowly or fidgety/restless 0 0 0 0   Suicidal thoughts 0 0 0 0   PHQ-9 Score 3 7 0 0   Difficult doing work/chores Not difficult at all - Not difficult at all Not difficult at all     Interpretation of Total Score  Total Score Depression Severity:  1-4 = Minimal depression, 5-9 = Mild depression, 10-14 = Moderate depression, 15-19 = Moderately severe depression, 20-27 = Severe depression   Psychosocial Evaluation and Intervention: Psychosocial Evaluation - 12/04/19 1415      Psychosocial Evaluation & Interventions   Interventions  Encouraged to exercise with the program and follow exercise prescription    Comments  Pamella is coming  to cardiac rehab after a mitral valve repair.  She has a great support system in her family that lives near by.  She is looking forward to rehab to get herself back.  She has been dealing with a leaky valve since her 46s and has always gotten by up until last year when her symptoms started to interfere with her life.  She gave up going to the gym after work with friends as she just was not able to go anymore.  She really wants to get her stamina back and be able to go again.  She also wants to make sure her heart is back in working order.    Expected Outcomes  Short: Attend rehab to get back to exercise Long: Build stamina back up to be able to go again.    Continue Psychosocial Services   Follow up required by staff       Psychosocial Re-Evaluation: Psychosocial Re-Evaluation    [place] Name 12/24/19 1138 12/28/19 1106           Psychosocial Re-Evaluation   Current issues  with  Current Sleep Concerns;Current Stress Concerns  Current Sleep Concerns;Current Stress Concerns      Comments  Patient reports that she still does not sleep very well at night. She wakes up a lot thoughout the night. She has worked with her doctor and has sleep aids to use when needed. She does not feel drained during the day however and is able to function well during the day.  Reviewed patient health questionnaire (PHQ-9) with patient for follow up. Previously, patients score indicated signs/symptoms of depression.  Reviewed to see if patient is improving symptom wise while in program.  Score improved and patient states that it is because she has been having more energy due to able to exercise more. She has been interviewing  for a new job recently and may not be able to finish the program. She is going to let us know if she gets a new job and try to finish the program completely.      Expected Outcomes  Short: Keep working with doctor and working on establishing a good sleeping routine/pattern. Long: maintain good mental health and stress managment.  Short: Continue to attend [facility] regularly for regular exercise and social engagement. Long: Continue to improve symptoms and manage a positive mental state.      Interventions  Encouraged to attend Cardiac Rehabilitation for the exercise  Encouraged to attend Cardiac Rehabilitation for the exercise      Continue Psychosocial Services   Follow up required by staff  No Follow up required         Psychosocial Discharge (Final Psychosocial Re-Evaluation): Psychosocial Re-Evaluation - 12/28/19 1106      Psychosocial Re-Evaluation   Current issues with  Current Sleep Concerns;Current Stress Concerns    Comments  Reviewed patient health questionnaire (PHQ-9) with patient for follow up. Previously, patients score indicated signs/symptoms of depression.  Reviewed to see if patient is improving symptom wise while in program.  Score improved and  patient states that it is because she has been having more energy due to able to exercise more. She has been interviewing for a new job recently and may not be able to finish the program. She is going to let us know if she gets a new job and try to finish the program completely.    Expected Outcomes  Short: Continue to attend [facility] regularly for regular exercise and social engagement. Long: Continue to improve symptoms and manage a positive mental state.    Interventions  Encouraged to attend Cardiac Rehabilitation for the exercise    Continue Psychosocial Services   No Follow up required       Vocational Rehabilitation: Provide vocational rehab assistance to qualifying candidates.   Vocational Rehab Evaluation & Intervention: Vocational Rehab - 12/04/19 1412      Initial Vocational Rehab Evaluation & Intervention   Assessment shows need for Vocational Rehabilitation  Yes   still working      Education: Education Goals: Education classes will be provided on a variety of topics geared toward better understanding of heart health and risk factor modification. Participant will state understanding/return demonstration of topics presented as noted by education test scores.  Learning Barriers/Preferences: Learning Barriers/Preferences - 12/04/19 1411      Learning Barriers/Preferences   Learning Barriers  Sight   glasses (needs new Rx)   Learning Preferences  Skilled Demonstration;Verbal Instruction       General Cardiac Education Topics:  AED/CPR: - Group verbal and written instruction with the use of models to demonstrate the basic use of the AED with the basic ABC's of resuscitation.   Anatomy & Physiology of the Heart: - Group verbal and written instruction and models provide basic cardiac anatomy and physiology, with the coronary electrical and arterial systems. Review of Valvular disease and Heart Failure   Cardiac Procedures: - Group verbal and written  instruction to review commonly prescribed medications for heart disease. Reviews the medication, class of the drug, and side effects. Includes the steps to properly store meds and maintain the prescription regimen. (beta blockers and nitrates)   Cardiac Medications I: - Group verbal and written instruction to review commonly prescribed medications for heart disease. Reviews the medication, class of the drug, and side effects. Includes the steps to properly store  meds and maintain the prescription regimen.   Cardiac Medications II: -Group verbal and written instruction to review commonly prescribed medications for heart disease. Reviews the medication, class of the drug, and side effects. (all other drug classes)    Go Sex-Intimacy & Heart Disease, Get SMART - Goal Setting: - Group verbal and written instruction through game format to discuss heart disease and the return to sexual intimacy. Provides group verbal and written material to discuss and apply goal setting through the application of the S.M.A.R.T. Method.   Other Matters of the Heart: - Provides group verbal, written materials and models to describe Stable Angina and Peripheral Artery. Includes description of the disease process and treatment options available to the cardiac patient.   Infection Prevention: - Provides verbal and written material to individual with discussion of infection control including proper hand washing and proper equipment cleaning during exercise session.   Cardiac Rehab from 12/05/2019 in [facility] Cardiac and Pulmonary Rehab  Date  12/05/19  Educator  AS  Instruction Review Code  1- Verbalizes Understanding      Falls Prevention: - Provides verbal and written material to individual with discussion of falls prevention and safety.   Cardiac Rehab from 12/05/2019 in [facility] Cardiac and Pulmonary Rehab  Date  12/05/19  Educator  AS  Instruction Review Code  1- Verbalizes Understanding      Other: -Provides  group and verbal instruction on various topics (see comments)   Knowledge Questionnaire Score: Knowledge Questionnaire Score - 12/05/19 1258      Knowledge Questionnaire Score   Pre Score  25/26       Core Components/Risk Factors/Patient Goals at Admission: Personal Goals and Risk Factors at Admission - 12/05/19 1253      Core Components/Risk Factors/Patient Goals on Admission    Weight Management  Yes;Weight Loss;Obesity    Intervention  Weight Management: Develop a combined nutrition and exercise program designed to reach desired caloric intake, while maintaining appropriate intake of nutrient and fiber, sodium and fats, and appropriate energy expenditure required for the weight goal.;Weight Management: Provide education and appropriate resources to help participant work on and attain dietary goals.;Obesity: Provide education and appropriate resources to help participant work on and attain dietary goals.;Weight Management/Obesity: Establish reasonable short term and long term weight goals.    Admit Weight  182 lb 14.4 oz (83 kg)    Goal Weight: Short Term  172 lb (78 kg)    Goal Weight: Long Term  162 lb (73.5 kg)    Expected Outcomes  Short Term: Continue to assess and modify interventions until short term weight is achieved;Long Term: Adherence to nutrition and physical activity/exercise program aimed toward attainment of established weight goal;Weight Loss: Understanding of general recommendations for a balanced deficit meal plan, which promotes 1-2 lb weight loss per week and includes a negative energy balance of [phone] kcal/d;Understanding recommendations for meals to include 15-35% energy as protein, 25-35% energy from fat, 35-60% energy from carbohydrates, less than 26m of dietary cholesterol, 20-35 gm of total fiber daily;Understanding of distribution of calorie intake throughout the day with the consumption of 4-5 meals/snacks    Intervention  Provide education about  signs/symptoms and action to take for hypo/hyperglycemia.;Provide education about proper nutrition, including hydration, and aerobic/resistive exercise prescription along with prescribed medications to achieve blood glucose in normal ranges: Fasting glucose 65-99 mg/dL    Expected Outcomes  Short Term: Participant verbalizes understanding of the signs/symptoms and immediate care of hyper/hypoglycemia, proper foot care and importance  of medication, aerobic/resistive exercise and nutrition plan for blood glucose control.;Long Term: Attainment of HbA1C < 7%.    Intervention  Provide education on lifestyle modifcations including regular physical activity/exercise, weight management, moderate sodium restriction and increased consumption of fresh fruit, vegetables, and low fat dairy, alcohol moderation, and smoking cessation.;Monitor prescription use compliance.    Expected Outcomes  Short Term: Continued assessment and intervention until BP is < 140/1m HG in hypertensive participants. < 130/834mHG in hypertensive participants with diabetes, heart failure or chronic kidney disease.;Long Term: Maintenance of blood pressure at goal levels.    Intervention  Provide education and support for participant on nutrition & aerobic/resistive exercise along with prescribed medications to achieve LDL <7062mHDL >68m80m  Expected Outcomes  Short Term: Participant states understanding of desired cholesterol values and is compliant with medications prescribed. Participant is following exercise prescription and nutrition guidelines.;Long Term: Cholesterol controlled with medications as prescribed, with individualized exercise RX and with personalized nutrition plan. Value goals: LDL < 70mg24mL > 40 mg.       Education:Diabetes - Individual verbal and written instruction to review signs/symptoms of diabetes, desired ranges of glucose level fasting, after meals and with exercise. Acknowledge that pre and post exercise  glucose checks will be done for 3 sessions at entry of program.   Education: Know Your Numbers and Risk Factors: -Group verbal and written instruction about important numbers in your health.  Discussion of what are risk factors and how they play a role in the disease process.  Review of Cholesterol, Blood Pressure, Diabetes, and BMI and the role they play in your overall health.   Core Components/Risk Factors/Patient Goals Review:  Goals and Risk Factor Review    Row Name 12/24/19 1132             Core Components/Risk Factors/Patient Goals Review   Personal Goals Review  Weight Management/Obesity;Lipids;Diabetes       Review  Patient reports taking all meds as prescribed and monitoring blood sugars at home. Weight has been steady with no large increases or decreases. Patient has not yet met with program dietician.       Expected Outcomes  Short: set up appointment with program dietician. Long: continue to control risk factors with health healthy life style.          Core Components/Risk Factors/Patient Goals at Discharge (Final Review):  Goals and Risk Factor Review - 12/24/19 1132      Core Components/Risk Factors/Patient Goals Review   Personal Goals Review  Weight Management/Obesity;Lipids;Diabetes    Review  Patient reports taking all meds as prescribed and monitoring blood sugars at home. Weight has been steady with no large increases or decreases. Patient has not yet met with program dietician.    Expected Outcomes  Short: set up appointment with program dietician. Long: continue to control risk factors with health healthy life style.       ITP Comments: ITP Comments    Row Name 12/04/19 1439 12/05/19 1310 12/10/19 1134 12/12/19 0529 01/09/20 0550   ITP Comments  Completed virtual orientation today.  EP evaluation is scheduled for Wednesday 4/14 at 1130am.  [facility].  Completed 6MWT and gym orientation.  Initial ITP  created and sent for review to Dr. Mark Emily Filbertical Director.  First full day of exercise!  Patient was oriented to gym and equipment including functions, settings, policies, and procedures.  Patient's individual exercise prescription and treatment plan  were reviewed.  All starting workloads were established based on the results of the 6 minute walk test done at initial orientation visit.  The plan for exercise progression was also introduced and progression will be customized based on patient's performance and goals.  30 Day review completed. Medical Director review done, changes made as directed,and approval shown by signature of Market researcher.  New to program  30 Day review completed. ITP review done, changes made as directed,and approval shown by signature of  Scientist, research (life sciences).   [place] Name 01/09/20 0637           ITP Comments  Caled this AM to say she is starting her new job this week instead of June 1. Asked to be discharged.          Comments: discharged -returning to work

## 2020-01-10 ENCOUNTER — Institutional Professional Consult (permissible substitution): Payer: Managed Care, Other (non HMO) | Admitting: Pulmonary Disease

## 2020-01-18 ENCOUNTER — Ambulatory Visit: Payer: Managed Care, Other (non HMO) | Admitting: Internal Medicine

## 2020-01-18 NOTE — Progress Notes (Deleted)
Follow-up Outpatient Visit Date: 01/18/2020  Primary Care Provider: Theadore Nan, NP [address] [place] [address]  Chief Complaint: ***  HPI:  Bonnie Ryan is a 64 y.o. female with history of mitral valve prolapse with severe regurgitation status post mitral valve repair in 08/2019 complicated by postoperative atrial fibrillation, nonobstructive coronary artery disease, hypertension, and type 2 diabetes mellitus, who presents for follow-up of mitral valve disease.  She was last seen in our office in early March by Gillian Shields, NP, at which time she reported continued fatigue and exertional dyspnea.  She was frustrated that medication changes had not improved her symptoms.  She was continued on furosemide 40 mg daily as needed for edema and weight gain as well as losartan and metoprolol succinate.  She was seen last month by Dr. Constance Goltz and was felt to be doing well.  She has subsequently been participating in cardiac rehab.  --------------------------------------------------------------------------------------------------  Past Medical History:  Diagnosis Date  . Diabetes mellitus    Previously diet-controlled  . Endometriosis   . Family history of adverse reaction to anesthesia    mother used to have vomiting after   . Hypercholesteremia   . Hypertension   . Mitral Valve Prolapse w/ Mitral regurgitation    a. 10/2009 Echo: Mod MVP, mild MR; b. 07/2019 Echo: EF 60-65%, Gr2 DD, Mod MR; c. 07/2019 TEE: EF 60-65%. No rwma. Mod dil LA. Sev MR w/ sev bileaflet prolapse. Mild TR; d. 08/2019 s/p minimally invasive MV repair.  . Non-obstructive CAD (coronary artery disease)    a. 09/2006 Cath: LAD 30, RCA 25; b.07/2011 Cath: LM Ca2+, 30-18mLAD, RCA 30-40p, EF 55-65%, 2+ MR; c. 07/2019 Cath: LM 47m/d, LAD 69m, RI nl, LCX 11m, RCA 45p, 43m.  Marland Kitchen Post-op atrial fibrillation (HCC)    a. 08/2019 following MV repair-->Amio/warfarin initiated.  Initially prolonged QT w/ IV  amio-->converted to sinus.  . RLS (restless legs syndrome)   . S/P minimally invasive mitral valve repair 08/30/2019   a. 08/2019 s/p Complex valvuloplasty including artificial Goretex neochord placement x12 with plication of posterior leaflet and 40 mm Sorin Memo 4D ring annuloplasty via right mini thoracotomy approach.  Marland Kitchen UTI (urinary tract infection)    Past Surgical History:  Procedure Laterality Date  . ABDOMINAL HYSTERECTOMY     tah/bso  . APPENDECTOMY    . BUBBLE STUDY  08/09/2019   Procedure: BUBBLE STUDY;  Surgeon: Sande Rives, MD;  Location: [facility] ENDOSCOPY;  Service: Cardiology;;  . CHOLECYSTECTOMY    . KNEE SURGERY    . LAPAROSCOPY    . MITRAL VALVE REPAIR Right 08/30/2019   Procedure: MINIMALLY INVASIVE MITRAL VALVE REPAIR (MVR) USING MEMO 4D SIZE 40;  Surgeon: Purcell Nails, MD;  Location: [facility] OR;  Service: Open Heart Surgery;  Laterality: Right;  . RIGHT/LEFT HEART CATH AND CORONARY ANGIOGRAPHY N/A 08/09/2019   Procedure: RIGHT/LEFT HEART CATH AND CORONARY ANGIOGRAPHY;  Surgeon: Yvonne Kendall, MD;  Location: [facility] INVASIVE CV LAB;  Service: Cardiovascular;  Laterality: N/A;  . TEE WITHOUT CARDIOVERSION N/A 08/09/2019   Procedure: TRANSESOPHAGEAL ECHOCARDIOGRAM (TEE);  Surgeon: Sande Rives, MD;  Location[facility] ENDOSCOPY;  Service: Cardiology;  Laterality: N/A;  . TEE WITHOUT CARDIOVERSION N/A 08/30/2019   Procedure: TRANSESOPHAGEAL ECHOCARDIOGRAM (TEE);  Surgeon: Purcell Nails, MD;  [facility] OR;  Service: Open Heart Surgery;  Laterality: N/A;  . US ECHOCARDIOGRAPHY     EF 55-60%    No outpatient medications have been marked as  taking for the 01/18/20 encounter (Appointment) with Rylinn Linzy, Harrell Gave, MD.    Allergies: Penicillins  Social History   Tobacco Use  . Smoking status: Former Smoker    Packs/day: 0.25    Years: 20.00    Pack years: 5.00    Types: Cigarettes    Quit date: 07/24/2015    Years since quitting: 4.4  . Smokeless tobacco: Never  Used  Substance Use Topics  . Alcohol use: Not Currently    Alcohol/week: 5.0 standard drinks    Types: 5 Standard drinks or equivalent per week  . Drug use: No    Family History  Problem Relation Age of Onset  . Diabetes Mother   . Breast cancer Mother 5  . Hypertension Father   . Ovarian cancer Neg Hx   . Colon cancer Neg Hx   . Heart disease Neg Hx     Review of Systems: A 12-system review of systems was performed and was negative except as noted in the HPI.  --------------------------------------------------------------------------------------------------  Physical Exam: LMP  (LMP Unknown)   General:  *** HEENT: No conjunctival pallor or scleral icterus. Facemask in place. Neck: Supple without lymphadenopathy, thyromegaly, JVD, or HJR. Lungs: Normal work of breathing. Clear to auscultation bilaterally without wheezes or crackles. Heart: Regular rate and rhythm without murmurs, rubs, or gallops. Non-displaced PMI. Abd: Bowel sounds present. Soft, NT/ND without hepatosplenomegaly Ext: No lower extremity edema. Radial, PT, and DP pulses are 2+ bilaterally. Skin: Warm and dry without rash.  EKG:  ***  Lab Results  Component Value Date   WBC 7.2 10/19/2019   HGB 13.5 10/19/2019   HCT 40.7 10/19/2019   MCV 86 10/19/2019   PLT 226 10/19/2019    Lab Results  Component Value Date   NA 142 11/30/2019   K 3.5 11/30/2019   CL 99 11/30/2019   CO2 26 11/30/2019   BUN 11 11/30/2019   CREATININE 1.06 (H) 11/30/2019   GLUCOSE 143 (H) 11/30/2019   ALT 27 11/30/2019    Lab Results  Component Value Date   CHOL 192 11/30/2019   HDL 58 11/30/2019   LDLCALC 91 11/30/2019   LDLDIRECT 240.5 09/08/2006   TRIG 326 (H) 11/30/2019   CHOLHDL 3.3 11/30/2019    --------------------------------------------------------------------------------------------------  ASSESSMENT AND PLAN: Harrell Gave Almeda Ezra, MD 01/18/2020 6:10 AM

## 2020-01-25 ENCOUNTER — Other Ambulatory Visit: Payer: Self-pay | Admitting: Family Medicine

## 2020-02-13 ENCOUNTER — Encounter: Payer: Self-pay | Admitting: Nurse Practitioner

## 2020-02-13 DIAGNOSIS — E118 Type 2 diabetes mellitus with unspecified complications: Secondary | ICD-10-CM

## 2020-02-13 DIAGNOSIS — E785 Hyperlipidemia, unspecified: Secondary | ICD-10-CM

## 2020-02-13 MED ORDER — PEN NEEDLES 31G X 6 MM MISC: 3 refills | Status: DC

## 2020-02-13 MED ORDER — ROSUVASTATIN CALCIUM 40 MG PO TABS: 40.0000 mg | ORAL_TABLET | Freq: Every day | ORAL | 3 refills | Status: DC

## 2020-02-13 NOTE — Telephone Encounter (Signed)
1. I printed her Ambien Rx in April- and the notes say it was faxed to pharmacy. Please call the pharmacy and see if they got the Rx in April . Did Jacinta  pick it up?  She was to use this en route to PULM sleep testing. Is she needing refills?   2. Yes, I refilled her Crestor 40 gm daily today but -    3. I need to see her back in the office for an office visit and I ordered  labs to check her liver and lipid panel gain on higher dose of Crestor.    4. Her needles were refilled by Dr. Kathie Rhodes and I ordered a different kind today that should get approved.   5. Please arrange OV with me.

## 2020-02-14 ENCOUNTER — Other Ambulatory Visit: Payer: Self-pay | Admitting: Nurse Practitioner

## 2020-02-14 DIAGNOSIS — G4700 Insomnia, unspecified: Secondary | ICD-10-CM

## 2020-02-14 MED ORDER — ZOLPIDEM TARTRATE ER 6.25 MG PO TBCR: 6.2500 mg | EXTENDED_RELEASE_TABLET | Freq: Every evening | ORAL | 0 refills | Status: DC | PRN

## 2020-02-14 NOTE — Progress Notes (Unsigned)
Please let her know the [facility]  RX is here for her to pick up to take to her pharmacy.   She needs an appt with me, please arrange.

## 2020-02-14 NOTE — Progress Notes (Signed)
LMTCB to scheduled follow up appt and that rx is ready to be picked up

## 2020-02-18 NOTE — Progress Notes (Signed)
Patient could not come to the office to pick up rx. I pulled rx from the front and called in to pharmacy. I have asked the patient several times to call the office and schedule a follow up appt.

## 2020-02-27 ENCOUNTER — Other Ambulatory Visit: Payer: Self-pay

## 2020-02-27 ENCOUNTER — Ambulatory Visit
Admission: EM | Admit: 2020-02-27 | Discharge: 2020-02-27 | Disposition: A | Discharge status: Home / Self Care | Payer: Managed Care, Other (non HMO) | Attending: Emergency Medicine | Admitting: Emergency Medicine

## 2020-02-27 DIAGNOSIS — Z20822 Contact with and (suspected) exposure to covid-19: Secondary | ICD-10-CM | POA: Diagnosis not present

## 2020-02-27 DIAGNOSIS — R059 Cough, unspecified: Secondary | ICD-10-CM

## 2020-02-27 DIAGNOSIS — R05 Cough: Secondary | ICD-10-CM | POA: Diagnosis not present

## 2020-02-27 DIAGNOSIS — R5383 Other fatigue: Secondary | ICD-10-CM

## 2020-02-27 MED ORDER — BENZONATATE 100 MG PO CAPS
100.0000 mg | ORAL_CAPSULE | Freq: Three times a day (TID) | ORAL | 0 refills | Status: DC | PRN
Start: 2020-02-27 — End: 2020-04-11

## 2020-02-27 NOTE — ED Triage Notes (Signed)
Patient reports fatigue x1 week and cough, congestion, and diarrhea x2 days. Reports she gets up to use the restroom and drink water, but is sleeping most of the time. Reports she took alka-seltzer cold and flu, which provided some relief of her symptoms.

## 2020-02-27 NOTE — ED Provider Notes (Signed)
Bonnie Ryan    CSN: [id number] Arrival date & time: 02/27/20  6967      History   Chief Complaint Chief Complaint  Patient presents with  . Cough  . Nasal Congestion  . Diarrhea  . Fatigue    HPI Bonnie Ryan is a 64 y.o. female.   Patient presents with fatigue x1 week.  She reports nonproductive cough, congestion, diarrhea x2 days.  She states she is drinking plenty of oral fluids.  She denies fever, chills, shortness of breath, vomiting, rash, or other symptoms.  Treatment attempted at home with Alka-Seltzer cold and flu.  2 coworkers tested positive for COVID this week.  Patient declines point-of-care testing; she would prefer PCR.  The history is provided by the patient.    Past Medical History:  Diagnosis Date  . Diabetes mellitus    Previously diet-controlled  . Endometriosis   . Family history of adverse reaction to anesthesia    mother used to have vomiting after   . Hypercholesteremia   . Hypertension   . Mitral Valve Prolapse w/ Mitral regurgitation    a. 10/2009 Echo: Mod MVP, mild MR; b. 07/2019 Echo: EF 60-65%, Gr2 DD, Mod MR; c. 07/2019 TEE: EF 60-65%. No rwma. Mod dil LA. Sev MR w/ sev bileaflet prolapse. Mild TR; d. 08/2019 s/p minimally invasive MV repair.  . Non-obstructive CAD (coronary artery disease)    a. 09/2006 Cath: LAD 30, RCA 25; b.07/2011 Cath: LM Ca2+, 30-37mLAD, RCA 30-40p, EF 55-65%, 2+ MR; c. 07/2019 Cath: LM 33m/d, LAD 28m, RI nl, LCX 91m, RCA 45p, 37m.  Marland Kitchen Post-op atrial fibrillation (HCC)    a. 08/2019 following MV repair-->Amio/warfarin initiated.  Initially prolonged QT w/ IV amio-->converted to sinus.  . RLS (restless legs syndrome)   . S/P minimally invasive mitral valve repair 08/30/2019   a. 08/2019 s/p Complex valvuloplasty including artificial Goretex neochord placement x12 with plication of posterior leaflet and 40 mm Sorin Memo 4D ring annuloplasty via right mini thoracotomy approach.  Marland Kitchen UTI (urinary tract infection)      Patient Active Problem List   Diagnosis Date Noted  . Anemia 10/25/2019  . Atrial fibrillation (HCC) 09/07/2019  . Encounter for therapeutic drug monitoring 09/07/2019  . S/P minimally invasive mitral valve repair 08/30/2019  . Mitral valve prolapse 08/08/2019  . Nonrheumatic mitral valve regurgitation 08/08/2019  . Acute on chronic diastolic heart failure due to valvular disease (HCC) 08/08/2019  . Type 2 diabetes mellitus with complication, without long-term current use of insulin (HCC) 04/11/2018  . Carotid stenosis 04/11/2018  . Unstable bladder 03/17/2016  . Vaginal atrophy 03/17/2016  . Cystocele 03/17/2016  . Nocturia 03/17/2016  . Urinary frequency 03/17/2016  . Stress incontinence 03/17/2016  . Abnormal resting ECG findings 02/26/2013  . Dizziness 08/11/2011  . Shortness of breath 07/30/2011  . Essential hypertension 07/30/2011  . HLD (hyperlipidemia) 07/30/2011  . Edema 07/22/2011  . CAD, NATIVE VESSEL 10/28/2009  . MITRAL VALVE PROLAPSE 10/28/2009  . CHEST PAIN 10/28/2009    Past Surgical History:  Procedure Laterality Date  . ABDOMINAL HYSTERECTOMY     tah/bso  . APPENDECTOMY    . BUBBLE STUDY  08/09/2019   Procedure: BUBBLE STUDY;  Surgeon: Sande Rives, MD;  Location: [facility] ENDOSCOPY;  Service: Cardiology;;  . CHOLECYSTECTOMY    . KNEE SURGERY    . LAPAROSCOPY    . MITRAL VALVE REPAIR Right 08/30/2019   Procedure: MINIMALLY INVASIVE MITRAL VALVE REPAIR (MVR) USING MEMO 4D SIZE  40;  Surgeon: Purcell Nailswen, Clarence H, MD;  [facility];  Service: Open Heart Surgery;  Laterality: Right;  . RIGHT/LEFT HEART CATH AND CORONARY ANGIOGRAPHY N/A 08/09/2019   Procedure: RIGHT/LEFT HEART CATH AND CORONARY ANGIOGRAPHY;  Surgeon: Yvonne KendallEnd, Christopher, MD;  Location: [facility] INVASIVE CV LAB;  Service: Cardiovascular;  Laterality: N/A;  . TEE WITHOUT CARDIOVERSION N/A 08/09/2019   Procedure: TRANSESOPHAGEAL ECHOCARDIOGRAM (TEE);  Surgeon: Sande Rives'Neal, Marinette Thomas, MD;  [facility];  Service: Cardiology;  Laterality: N/A;  . TEE WITHOUT CARDIOVERSION N/A 08/30/2019   Procedure: TRANSESOPHAGEAL ECHOCARDIOGRAM (TEE);  Surgeon: Purcell Nailswen, Clarence H, MD;  [facility];  Service: Open Heart Surgery;  Laterality: N/A;  . US ECHOCARDIOGRAPHY     EF 55-60%    OB History    Gravida  1   Para  1   Term  1   Preterm      AB      Living  1     SAB      TAB      Ectopic      Multiple      Live Births  1            Home Medications    Prior to Admission medications   Medication Sig Start Date End Date Taking? Authorizing Provider  aspirin EC 81 MG tablet Take 1 tablet (81 mg total) by mouth daily. 08/10/19   End, Cristal Deerhristopher, MD  benzonatate (TESSALON) 100 MG capsule Take 1 capsule (100 mg total) by mouth 3 (three) times daily as needed for cough. 02/27/20   Mickie Bailate, Katyra Tomassetti H, NP  furosemide (LASIX) 40 MG tablet Take 1 tablet (40 mg total) by mouth as needed for fluid or edema. 10/31/19 01/29/20  Alver SorrowWalker, Caitlin S, NP  Insulin Pen Needle (PEN NEEDLES) 31G X 6 MM MISC Use to inject Victoza 02/13/20   Theadore NanMills, Kimberly A, NP  KLOR-CON M20 20 MEQ tablet TAKE 1 TABLET BY MOUTH EVERY DAY 01/01/20   End, Cristal Deerhristopher, MD  losartan (COZAAR) 25 MG tablet TAKE 1 TABLET BY MOUTH EVERY DAY 12/11/19   End, Cristal Deerhristopher, MD  losartan (COZAAR) 50 MG tablet Take 25 mg by mouth daily.    [provider]  metFORMIN (GLUCOPHAGE) 500 MG tablet Take 1 tablet (500 mg total) by mouth 2 (two) times daily with a meal. 10/22/19   Theadore NanMills, Kimberly A, NP  metoprolol succinate (TOPROL-XL) 25 MG 24 hr tablet TAKE 1 TABLET (25 MG TOTAL) BY MOUTH DAILY. PATIENT WILL CALL WHEN READY FOR REFILL. 11/26/19   End, Cristal Deerhristopher, MD  [facility] test strip USE AS INSTRUCTED USE TO TEST BLOOD GLUCOSE DAILY. DX E11.9 01/01/20   Glori LuisSonnenberg, Eric G, MD  rosuvastatin (CRESTOR) 40 MG tablet Take 1 tablet (40 mg total) by mouth daily. 02/13/20   Theadore NanMills, Kimberly A, NP  VICTOZA 18 MG/3ML SOPN Inject 0.3 mLs  (1.8 mg total) into the skin daily. 10/22/19   Theadore NanMills, Kimberly A, NP  warfarin (COUMADIN) 5 MG tablet TAKE 1.5 TABLETS ON MONDAYS AND WEDNESDAYS. TAKE 1 TABLET ON ALL OTHER DAYS. AS DIRECTED BY THE COUMADIN CLINIC. 11/26/19   End, Cristal Deerhristopher, MD  zolpidem (AMBIEN CR) 6.25 MG CR tablet Take 1 tablet (6.25 mg total) by mouth at bedtime as needed for sleep. 02/14/20   Theadore NanMills, Kimberly A, NP    Family History Family History  Problem Relation Age of Onset  . Diabetes Mother   . Breast cancer Mother [id number]  . Hypertension Father   .  Ovarian cancer Neg Hx   . Colon cancer Neg Hx   . Heart disease Neg Hx     Social History Social History   Tobacco Use  . Smoking status: Former Smoker    Packs/day: 0.25    Years: 20.00    Pack years: 5.00    Types: Cigarettes    Quit date: 07/24/2015    Years since quitting: 4.6  . Smokeless tobacco: Never Used  Vaping Use  . Vaping Use: Never assessed  Substance Use Topics  . Alcohol use: Not Currently    Alcohol/week: 5.0 standard drinks    Types: 5 Standard drinks or equivalent per week  . Drug use: No     Allergies   Penicillins   Review of Systems Review of Systems  Constitutional: Positive for fatigue. Negative for chills and fever.  HENT: Positive for congestion. Negative for ear pain and sore throat.   Eyes: Negative for pain and visual disturbance.  Respiratory: Positive for cough. Negative for shortness of breath.   Cardiovascular: Negative for chest pain and palpitations.  Gastrointestinal: Positive for diarrhea. Negative for abdominal pain, nausea and vomiting.  Genitourinary: Negative for dysuria and hematuria.  Musculoskeletal: Negative for arthralgias and back pain.  Skin: Negative for color change and rash.  Neurological: Negative for seizures and syncope.  All other systems reviewed and are negative.    Physical Exam Triage Vital Signs ED Triage Vitals  Enc Vitals Group     BP 02/27/20 0943 107/69     Pulse Rate 02/27/20  0943 92     Resp 02/27/20 0943 16     Temp 02/27/20 0943 99.9 F (37.7 C)     Temp src --      SpO2 02/27/20 0943 95 %     Weight --      Height --      Head Circumference --      Peak Flow --      Pain Score 02/27/20 0940 0     Pain Loc --      Pain Edu? --      Excl. in GC? --    No data found.  Updated Vital Signs BP 107/69   Pulse 92   Temp 99.9 F (37.7 C)   Resp 16   LMP  (LMP Unknown)   SpO2 95%   Visual Acuity Right Eye Distance:   Left Eye Distance:   Bilateral Distance:    Right Eye Near:   Left Eye Near:    Bilateral Near:     Physical Exam Vitals and nursing note reviewed.  Constitutional:      General: She is not in acute distress.    Appearance: She is well-developed. She is ill-appearing.  HENT:     Head: Normocephalic and atraumatic.     Right Ear: Tympanic membrane normal.     Left Ear: Tympanic membrane normal.     Nose: Congestion present.     Mouth/Throat:     Mouth: Mucous membranes are moist.     Pharynx: Oropharynx is clear.  Eyes:     Conjunctiva/sclera: Conjunctivae normal.  Cardiovascular:     Rate and Rhythm: Normal rate and regular rhythm.     Heart sounds: No murmur heard.   Pulmonary:     Effort: Pulmonary effort is normal. No respiratory distress.     Breath sounds: Normal breath sounds.  Abdominal:     Palpations: Abdomen is soft.     Tenderness: There is  no abdominal tenderness. There is no guarding or rebound.  Musculoskeletal:     Cervical back: Neck supple.  Skin:    General: Skin is warm and dry.     Findings: No rash.  Neurological:     General: No focal deficit present.     Mental Status: She is alert and oriented to person, place, and time.     Gait: Gait normal.  Psychiatric:        Mood and Affect: Mood normal.        Behavior: Behavior normal.      UC Treatments / Results  Labs (all labs ordered are listed, but only abnormal results are displayed) Labs Reviewed  NOVEL CORONAVIRUS, NAA     EKG   Radiology No results found.  Procedures Procedures (including critical care time)  Medications Ordered in UC Medications - No data to display  Initial Impression / Assessment and Plan / UC Course  I have reviewed the triage vital signs and the nursing notes.  Pertinent labs & imaging results that were available during my care of the patient were reviewed by me and considered in my medical decision making (see chart for details).   Fatigue, cough.  Exposure to Covid.  Patient declined POC test.  Treating cough with Tessalon Perles.  Instructed patient to take OTC Imodium as needed for diarrhea.  Discussed hydration and rest.  PCR Covid pending.  Instructed patient to self quarantine until her test result is back.  Instructed her to go to the emergency department if she has acute worsening symptoms.  Patient agrees to plan of care.       Final Clinical Impressions(s) / UC Diagnoses   Final diagnoses:  Exposure to COVID-19 virus  Fatigue, unspecified type  Cough     Discharge Instructions     Take the Tessalon Perles as needed for cough.  Take over-the-counter Imodium as needed for diarrhea.    Your COVID test is pending.  You should self quarantine until the test result is back.    Take Tylenol as needed for fever or discomfort.  Rest and keep yourself hydrated.    Go to the emergency department if you develop shortness of breath, severe diarrhea, high fever not relieved by Tylenol or ibuprofen, or other concerning symptoms.       ED Prescriptions    Medication Sig Dispense Auth. Provider   benzonatate (TESSALON) 100 MG capsule Take 1 capsule (100 mg total) by mouth 3 (three) times daily as needed for cough. 21 capsule Mickie Bail, NP     PDMP not reviewed this encounter.   Mickie Bail, NP 02/27/20 1005

## 2020-02-27 NOTE — Discharge Instructions (Addendum)
Take the [facility] as needed for cough.  Take over-the-counter Imodium as needed for diarrhea.    Your COVID test is pending.  You should self quarantine until the test result is back.    Take Tylenol as needed for fever or discomfort.  Rest and keep yourself hydrated.    Go to the emergency department if you develop shortness of breath, severe diarrhea, high fever not relieved by Tylenol or ibuprofen, or other concerning symptoms.

## 2020-02-28 LAB — NOVEL CORONAVIRUS, NAA: SARS-CoV-2, NAA: DETECTED — AB

## 2020-02-28 LAB — SARS-COV-2, NAA 2 DAY TAT

## 2020-02-29 ENCOUNTER — Telehealth: Payer: Self-pay | Admitting: Nurse Practitioner

## 2020-02-29 ENCOUNTER — Telehealth: Payer: Self-pay | Admitting: Physician Assistant

## 2020-02-29 NOTE — Telephone Encounter (Signed)
Called to discuss with Merlyn Lot about Covid symptoms and the use of bamlanivimab/etesevimab or casirivimab/imdevimab, a monoclonal antibody infusion for those with mild to moderate Covid symptoms and at a high risk of hospitalization.     Pt is qualified for this infusion at the monoclonal antibody infusion center due to co-morbid conditions and/or a member of an at-risk group, however declines infusion at this time. She would like to think more about it and speak with her family. Symptoms tier reviewed as well as criteria for ending isolation.  Symptoms reviewed that would warrant ED/Hospital evaluation. Preventative practices reviewed. Patient verbalized understanding. Patient advised to call back if he decides that he does want to get infusion. Callback number to the infusion center given. Patient advised to go to Urgent care or ED with severe symptoms. Last date pt would be eligible for infusion is 7/16.      Patient Active Problem List   Diagnosis Date Noted  . Anemia 10/25/2019  . Atrial fibrillation (HCC) 09/07/2019  . Encounter for therapeutic drug monitoring 09/07/2019  . S/P minimally invasive mitral valve repair 08/30/2019  . Mitral valve prolapse 08/08/2019  . Nonrheumatic mitral valve regurgitation 08/08/2019  . Acute on chronic diastolic heart failure due to valvular disease (HCC) 08/08/2019  . Type 2 diabetes mellitus with complication, without long-term current use of insulin (HCC) 04/11/2018  . Carotid stenosis 04/11/2018  . Unstable bladder 03/17/2016  . Vaginal atrophy 03/17/2016  . Cystocele 03/17/2016  . Nocturia 03/17/2016  . Urinary frequency 03/17/2016  . Stress incontinence 03/17/2016  . Abnormal resting ECG findings 02/26/2013  . Dizziness 08/11/2011  . Shortness of breath 07/30/2011  . Essential hypertension 07/30/2011  . HLD (hyperlipidemia) 07/30/2011  . Edema 07/22/2011  . CAD, NATIVE VESSEL 10/28/2009  . MITRAL VALVE PROLAPSE 10/28/2009  . CHEST  PAIN 10/28/2009    Cline Crock PA-C

## 2020-02-29 NOTE — Telephone Encounter (Signed)
Pt cancelled appt, LVM to see if pt wanted to r/s BL" [place] [facility] said about 2 months ago  "ATC pt 3 times to r/s appt appt, so closed referral BL" [place] [facility] said about 1 hour ago  "Rejection Reason - Patient did not respond"  Pulmonary at [facility] said about 1 hour ago

## 2020-02-29 NOTE — Telephone Encounter (Signed)
Please call her and advise that I do recommend that she take the immunoglobulin infusion to help decrease risk of severe Covid illness. Please set up a virtual visit with me in the next week.

## 2020-03-01 ENCOUNTER — Encounter: Payer: Self-pay | Admitting: Nurse Practitioner

## 2020-03-04 ENCOUNTER — Other Ambulatory Visit: Payer: Self-pay | Admitting: Internal Medicine

## 2020-03-04 NOTE — Telephone Encounter (Signed)
Please schedule overdue 2-3 month F/U appointment with Dr. Okey Dupre. Thank you!

## 2020-03-05 ENCOUNTER — Telehealth: Payer: Self-pay | Admitting: Nurse Practitioner

## 2020-03-05 NOTE — Telephone Encounter (Signed)
See other message. LMTCB

## 2020-03-05 NOTE — Telephone Encounter (Signed)
Please call the patient for a Covid symptom follow-up. She read my message asking her to set up a video visit, but has not responded. I recommended immunoglobulin infusion- did she get this? How is she doing?

## 2020-03-05 NOTE — Telephone Encounter (Signed)
LMTCB

## 2020-03-06 NOTE — Telephone Encounter (Signed)
Scheduled

## 2020-03-10 NOTE — Telephone Encounter (Signed)
LMTCB

## 2020-03-12 NOTE — Telephone Encounter (Signed)
Spoke with patient today and she did not want to have the infusion done and time has passed by now for it. Patient states she has started to feel much better.

## 2020-04-02 IMAGING — CT CT CTA ABD/PEL W/CM AND/OR W/O CM
2 of 6 series · 13 of 46 positions shown, 15 images · IV contrast (OMNIPAQUE)
Comparison: None.

CLINICAL DATA: Mitral valve prolapse, mitral regurgitation
preoperative assessment prior to planned mitral valve repair.

EXAM:
CT ANGIOGRAPHY CHEST, ABDOMEN AND PELVIS
TECHNIQUE: Multidetector CT imaging through the chest, abdomen and pelvis was
performed using the standard protocol during bolus administration of
intravenous contrast. Multiplanar reconstructed images and MIPs were
obtained and reviewed to evaluate the vascular anatomy.
CONTRAST:  100mL OMNIPAQUE IOHEXOL 350 MG/ML SOLN

[Series 5: axial arterial · axial · arterial · 0.75mm/px · z∈[-584,-26]mm · 10 of 208 slices shown, 12 images]
[im 11/208  soft-tissue]
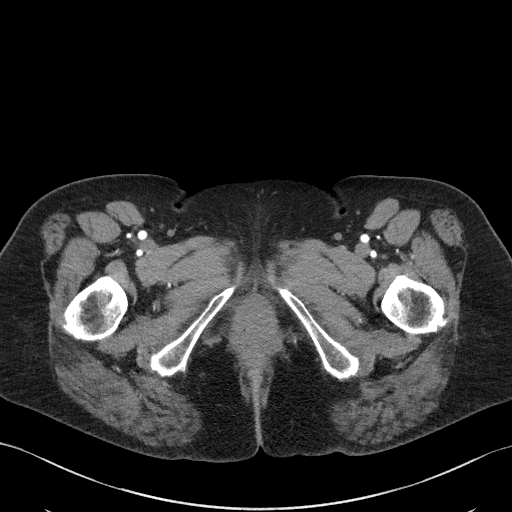
[im 11/208  bone]
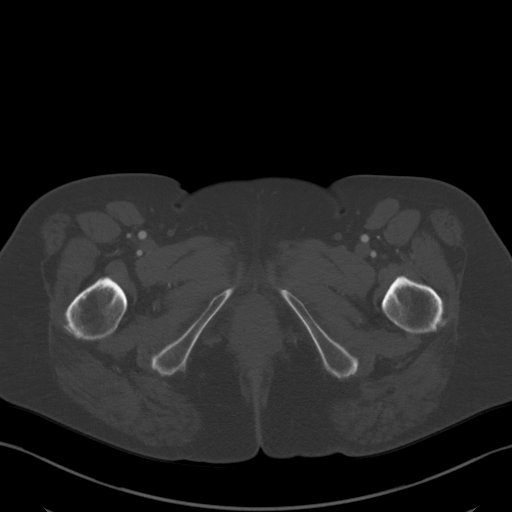
[im 33/208  soft-tissue]
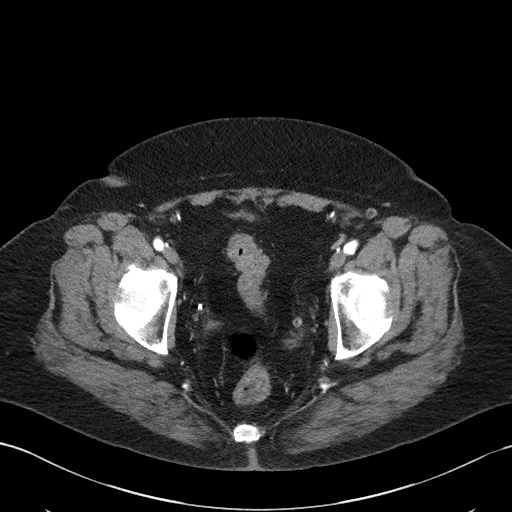
[im 55/208  soft-tissue]
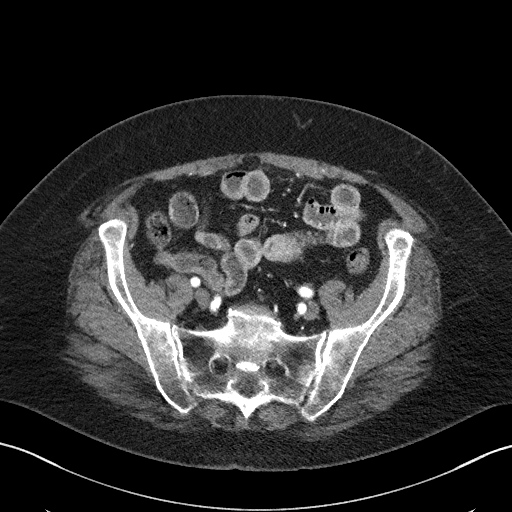
[im 77/208  soft-tissue]
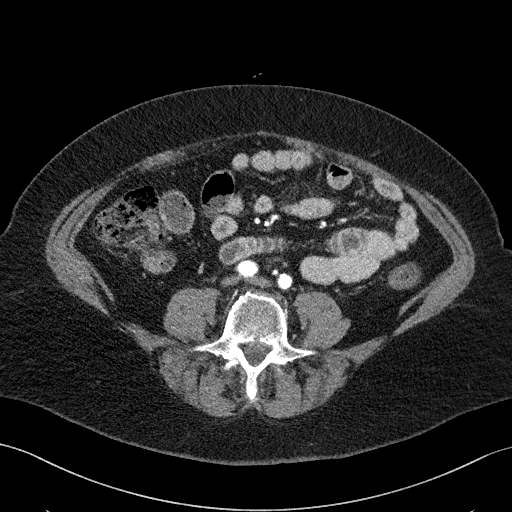
[im 99/208  soft-tissue]
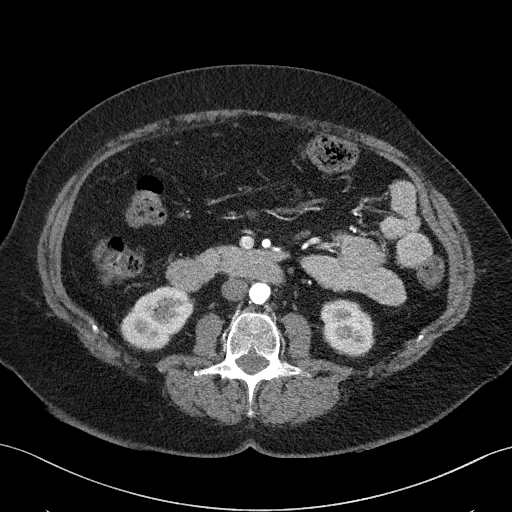
[im 109/208  soft-tissue]
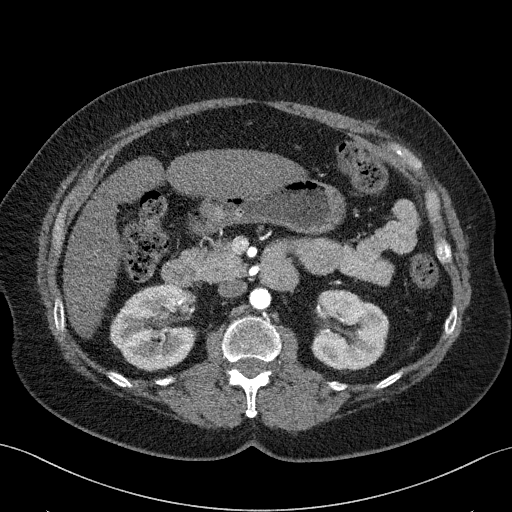
[im 131/208  soft-tissue]
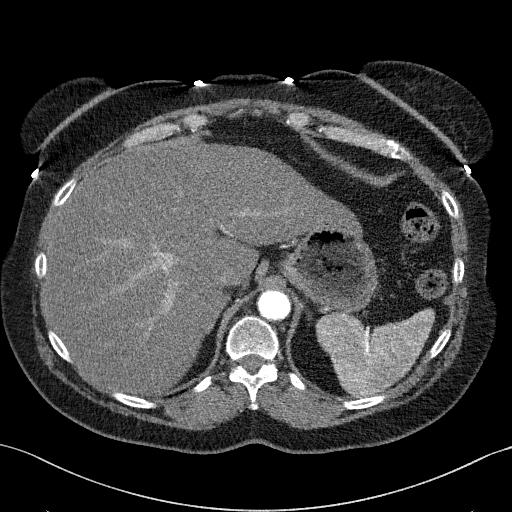
[im 153/208  soft-tissue]
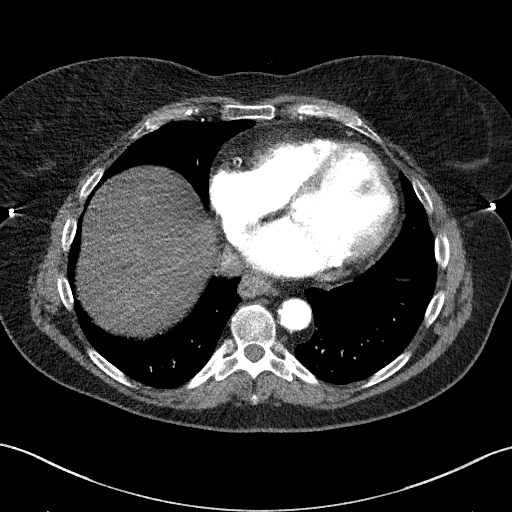
[im 175/208  soft-tissue]
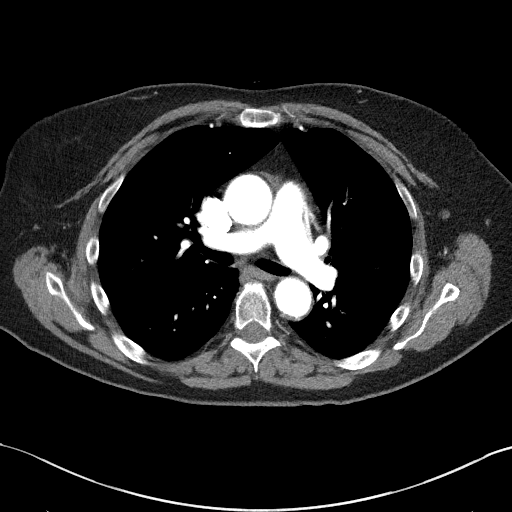
[im 175/208  bone]
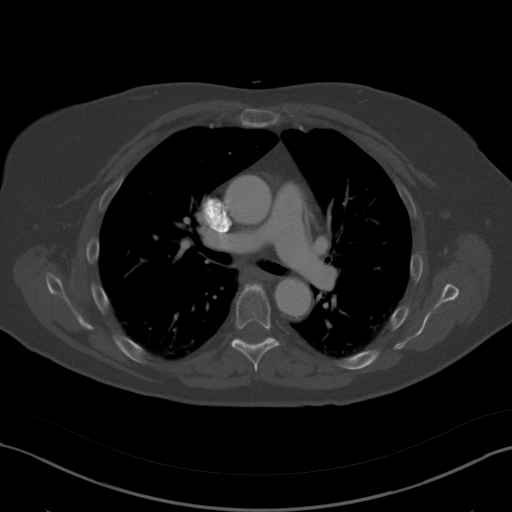
[im 197/208  soft-tissue]
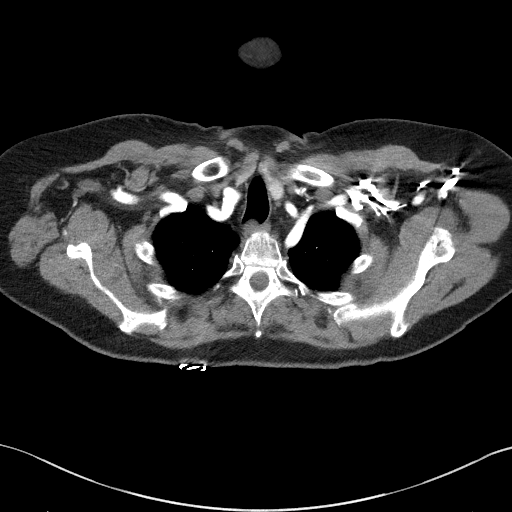

[Series 8: coronals · coronal · 0.78mm/px · 3 of 168 slices shown]
[im 42/168  soft-tissue]
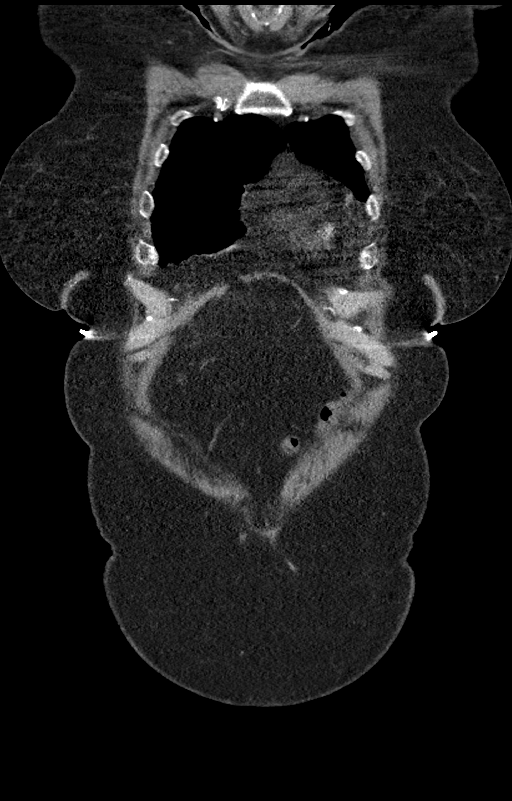
[im 84/168  soft-tissue]
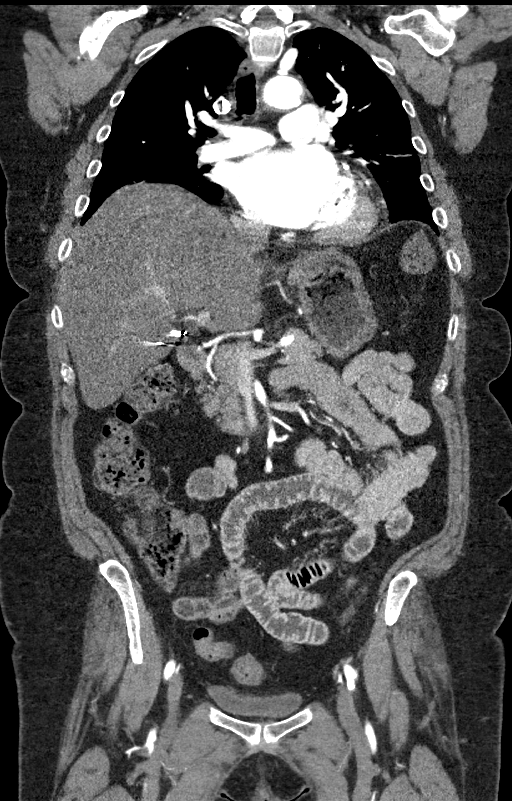
[im 126/168  soft-tissue]
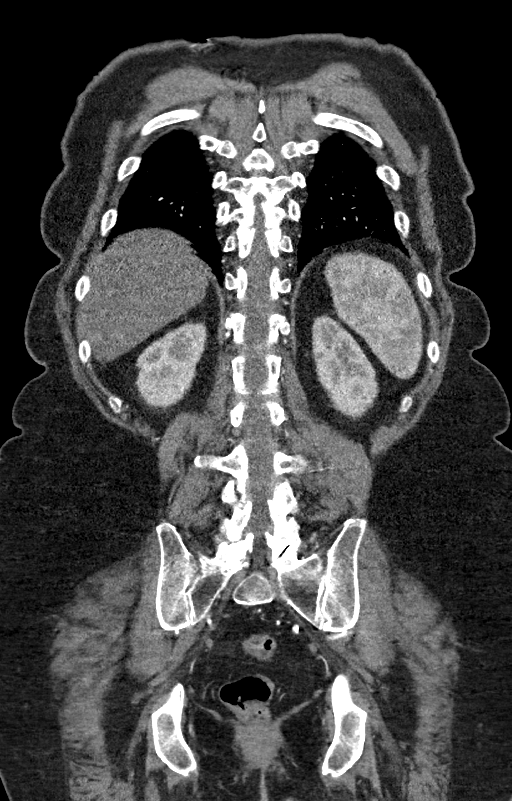

[13 of 46 positions shown; findings below may reference images not displayed]

FINDINGS: CTA CHEST FINDINGS

Cardiovascular: The left atrium appears dilated. No pericardial
fluid. Calcified plaque is noted of the coronary arteries in a 3
vessel distribution. The thoracic aorta is normal in caliber and
demonstrates mild atherosclerosis at the level of the arch and
descending thoracic aorta. Calcified plaque at the origin of the
left subclavian artery causes approximately 40% maximal stenosis.
Eccentric noncalcified plaque in the innominate artery causes
approximately 40-50% stenosis. Other visualized great vessels
demonstrate normal patency and are moderately tortuous. Central
pulmonary arteries are normal in caliber.

Mediastinum/Nodes: No enlarged mediastinal, hilar, or axillary lymph
nodes. Thyroid gland, trachea, and esophagus demonstrate no
significant findings. There is a small hiatal hernia.

Lungs/Pleura: Region scarring and atelectasis in the lingula. There
is no evidence of pulmonary edema, consolidation, pneumothorax,
nodule or pleural fluid.

Musculoskeletal: No chest wall abnormality. No acute or significant
osseous findings.

Review of the MIP images confirms the above findings.

CTA ABDOMEN AND PELVIS FINDINGS

VASCULAR

Aorta: The abdominal aorta demonstrates scattered calcified and
noncalcified atherosclerotic plaque without evidence of aneurysmal
disease or stenosis.

Celiac: Normally patent. Normally patent branch vessels.

SMA: Normally patent. Normally patent visualized branch vessels.

Renals: Bilateral single renal arteries demonstrate mild
atherosclerosis without evidence of stenosis.

IMA: Normally patent.

Inflow: Calcified plaque in both common iliac arteries without
evidence significant stenosis or aneurysmal disease. Bilateral
external and internal iliac arteries demonstrate normal patency.
Mild plaque in both common femoral arteries without evidence of
obstructive disease. The left femoral bifurcation is high and
located just above the inguinal ligament at the level of the
acetabular roof. The right femoral bifurcation is normal in position
at the level of the right femoral neck.

Review of the MIP images confirms the above findings.

NON-VASCULAR

Hepatobiliary: Diffuse hepatic steatosis without evidence focal
lesion or overt cirrhosis. The gallbladder has been removed. No
biliary ductal dilatation identified.

Pancreas: Unremarkable. No pancreatic ductal dilatation or
surrounding inflammatory changes.

Spleen: Normal in size without focal abnormality.

Adrenals/Urinary Tract: Adrenal glands are unremarkable. Kidneys are
normal, without renal calculi, focal lesion, or hydronephrosis.
Bladder is unremarkable.

Stomach/Bowel: Bowel shows no evidence of obstruction, ileus or
inflammation. No obvious focal lesions. No free air.

Lymphatic: No enlarged lymph nodes identified.

Reproductive: Status post hysterectomy. No adnexal masses.

Other: No abdominal wall hernia or abnormality. No abdominopelvic
ascites.

Musculoskeletal: Degenerative disc disease with disc space narrowing
at L4-5. No fractures or bony lesions identified.

Review of the MIP images confirms the above findings.
IMPRESSION: 1. Mild atherosclerosis of the thoracic and abdominal aorta without
evidence of aneurysmal disease or stenosis.
2. Atherosclerosis at the origin of the left subclavian artery and
in the innominate artery without significant stenosis.
3. Calcified atherosclerosis of the coronary arteries in a 3 vessel
distribution.
4. Left atrial enlargement.
5. Small hiatal hernia.
6. Left femoral bifurcation is high and located just above the
inguinal ligament at the level of the acetabular roof. The right
femoral bifurcation is normal in position at the level of the right
femoral neck.
7. Hepatic steatosis without focal lesion or overt cirrhosis.

## 2020-04-08 MED ORDER — METOPROLOL SUCCINATE ER 25 MG PO TB24: 25.0000 mg | ORAL_TABLET | Freq: Every day | ORAL | 0 refills | Status: DC

## 2020-04-09 ENCOUNTER — Other Ambulatory Visit: Payer: Self-pay

## 2020-04-09 ENCOUNTER — Other Ambulatory Visit (INDEPENDENT_AMBULATORY_CARE_PROVIDER_SITE_OTHER): Payer: Managed Care, Other (non HMO)

## 2020-04-09 DIAGNOSIS — E118 Type 2 diabetes mellitus with unspecified complications: Secondary | ICD-10-CM | POA: Diagnosis not present

## 2020-04-09 DIAGNOSIS — E876 Hypokalemia: Secondary | ICD-10-CM

## 2020-04-09 DIAGNOSIS — E785 Hyperlipidemia, unspecified: Secondary | ICD-10-CM | POA: Diagnosis not present

## 2020-04-10 ENCOUNTER — Telehealth: Payer: Self-pay | Admitting: *Deleted

## 2020-04-10 LAB — COMPREHENSIVE METABOLIC PANEL
ALT: 22 U/L (ref 0–35)
AST: 28 U/L (ref 0–37)
Albumin: 4.5 g/dL (ref 3.5–5.2)
Alkaline Phosphatase: 56 U/L (ref 39–117)
BUN: 10 mg/dL (ref 6–23)
CO2: 35 mEq/L — ABNORMAL HIGH (ref 19–32)
Calcium: 9.4 mg/dL (ref 8.4–10.5)
Chloride: 93 mEq/L — ABNORMAL LOW (ref 96–112)
Creatinine, Ser: 0.88 mg/dL (ref 0.40–1.20)
GFR: 64.73 mL/min (ref >60.00)
Glucose, Bld: 103 mg/dL — ABNORMAL HIGH (ref 70–99)
Potassium: 2.8 mEq/L — CL (ref 3.5–5.1)
Sodium: 142 mEq/L (ref 135–145)
Total Bilirubin: 0.8 mg/dL (ref 0.2–1.2)
Total Protein: 7 g/dL (ref 6.0–8.3)

## 2020-04-10 LAB — LIPID PANEL
Cholesterol: 121 mg/dL (ref 0–200)
HDL: 43.2 mg/dL (ref >39.00)
NonHDL: 77.42
Total CHOL/HDL Ratio: 3
Triglycerides: 252 mg/dL — ABNORMAL HIGH (ref 0.0–149.0)
VLDL: 50.4 mg/dL — ABNORMAL HIGH (ref 0.0–40.0)

## 2020-04-10 LAB — LDL CHOLESTEROL, DIRECT: Direct LDL: 47 mg/dL

## 2020-04-10 MED ORDER — POTASSIUM CHLORIDE CRYS ER 20 MEQ PO TBCR: EXTENDED_RELEASE_TABLET | ORAL | 1 refills | Status: DC

## 2020-04-10 NOTE — Addendum Note (Signed)
Addended by: Amedeo Kinsman A on: 04/10/2020 11:09 AM   Modules accepted: Orders

## 2020-04-10 NOTE — Telephone Encounter (Signed)
CRITICAL VALUE STICKER  CRITICAL VALUE: Potassium (L): 2.8  RECEIVER (on-site recipient of call): Silvestre Moment, CMA/XT  DATE & TIME NOTIFIED: 04/10/2020  MESSENGER (representative from lab): Henderson Cloud  MD NOTIFIED: Bunnie Domino, NP   TIME OF NOTIFICATION: 10:38am  RESPONSE:

## 2020-04-11 ENCOUNTER — Other Ambulatory Visit: Payer: Self-pay

## 2020-04-11 ENCOUNTER — Encounter: Payer: Self-pay | Admitting: Nurse Practitioner

## 2020-04-11 ENCOUNTER — Ambulatory Visit (INDEPENDENT_AMBULATORY_CARE_PROVIDER_SITE_OTHER): Payer: Managed Care, Other (non HMO) | Admitting: Nurse Practitioner

## 2020-04-11 VITALS — BP 108/68 | HR 91 | Temp 98.4°F | Ht 67.0 in | Wt 173.0 lb

## 2020-04-11 DIAGNOSIS — E785 Hyperlipidemia, unspecified: Secondary | ICD-10-CM

## 2020-04-11 DIAGNOSIS — E118 Type 2 diabetes mellitus with unspecified complications: Secondary | ICD-10-CM | POA: Diagnosis not present

## 2020-04-11 DIAGNOSIS — I10 Essential (primary) hypertension: Secondary | ICD-10-CM | POA: Diagnosis not present

## 2020-04-11 DIAGNOSIS — Z1231 Encounter for screening mammogram for malignant neoplasm of breast: Secondary | ICD-10-CM

## 2020-04-11 DIAGNOSIS — K760 Fatty (change of) liver, not elsewhere classified: Secondary | ICD-10-CM | POA: Diagnosis not present

## 2020-04-11 NOTE — Progress Notes (Signed)
Established Patient Office Visit  Subjective:  Patient ID: Bonnie Ryan, female    DOB: January 31, 1956  Age: 64 y.o. MRN: [id number]  CC:  Chief Complaint  Patient presents with  . Follow-up    medication refill/check    HPI Bonnie Ryan is a 64 year old patient with HTN, nonobstructive CAD, MVP with minimally invasive mitral valve surgery 08/30/2019 and postop course complicated by A. fib with RVR, LVEF 60%, former smoker, lifelong antibiotic prophylaxis for dental care, carotid stenosis, hepatic steatosis, HLD, RLS, T2DM,  positive Covid infection 02/27/2020 treated as outpatient recovered without MAB. She presents today for routine 38-monthfollow-up visit.  Patient presents today feeling well and has no concerns   Laboratory studies:  11/30/2019: HCV non reactive, hep B surface antigen negative, hep B surface antibody negative, hep B core total antibody negative, hep a antibody total negative.  12/05/2019: ceruloplasmin 33, ANA negative, alpha-1 antitrypsin 167, MM, mitochondrial antibody<20, ACSMA <20  Laboratory studies done on 04/09/2020 revealed potassium of 2.8, CO2 35, glucose 103, normal liver, lipid panel improved . PLAN: Kdur 20 BID x 3 days and then QD, See me tomorrow for OV and will need MEt B next week.  .  Immunizations:Shingrix- needs it done here- never completed series, needs Prevnar Declines [facility], [facility].  Declines Covid vaccine since she had the virus in July. Needs Twinrix for FL and no immunity to Hep A/B.   Flu shot: recommended -decided to wait Diet: healthy Dm Exercise: not reg  Colonoscopy:a bunch of polyps- maybe done 2018 [facility]-  Dexa:declined Pap Smear: declined Mammogram: 09/19/18 DUE  Dentist: UTD Vision: UTD Smoker: Previous smoker, 5 pack years, quit date 07/24/2015  T2DM: A1c 8.2 in April. Not at goal <8.0  Remains on Victoza 1.8 mg daily, Metformin 500 mg twice daily.  She does not check  blood sugars at home.  Needs urine microalbumin.  Foot exam negative in April. Dilated eye exam done 12/27/2019.  HTN: At goal: Remains on losartan 25 mg daily, Toprol-XL 25 mg daily, Klor-Con 20 mEq listed, not taking, she is off of furosemide.  Does not check blood pressures at home. BP Readings from Last 3 Encounters:  04/11/20 108/68  02/27/20 107/69  11/30/19 118/62   Insomnia: Ambien 6.25 mg as needed-controlled substance contract signed April 2021  HLD: At goal: Crestor 40 mg daily and tolerating the dose increase from April well.  She has noted no myalgias.  Triglycerides are improving, LDL at goal  Hepatic steatosis: Ct angio found this and no cirrhosis. Recent LFTs are normal, specialty liver labs unremarkable as noted above.  Patient has no right upper quadrant pain.  She is working on diet and exercise.  She does need hepatitis A B vaccine.    Past Medical History:  Diagnosis Date  . Diabetes mellitus    Previously diet-controlled  . Endometriosis   . Family history of adverse reaction to anesthesia    mother used to have vomiting after   . Hepatic steatosis 04/15/2020  . Hypercholesteremia   . Hypertension   . Mitral Valve Prolapse w/ Mitral regurgitation    a. 10/2009 Echo: Mod MVP, mild MR; b. 07/2019 Echo: EF 60-65%, Gr2 DD, Mod MR; c. 07/2019 TEE: EF 60-65%. No rwma. Mod dil LA. Sev MR w/ sev bileaflet prolapse. Mild TR; d. 08/2019 s/p minimally invasive MV repair.  . Non-obstructive CAD (coronary artery disease)    a. 09/2006 Cath:  LAD 30, RCA 25; b.07/2011 Cath: LM Ca2+, 30-107mAD, RCA 30-40p, EF 55-65%, 2+ MR; c. 07/2019 Cath: LM 179m, LAD 5017mI nl, LCX 40m34mA 45p, 18m.38mPosMarland Kitchen-op atrial fibrillation (HCC) [place]. 08/2019 following MV repair-->Amio/warfarin initiated.  Initially prolonged QT w/ IV amio-->converted to sinus.  . RLS (restless legs syndrome)   . S/P minimally invasive mitral valve repair 08/30/2019   a. 08/2019 s/p Complex valvuloplasty including  artificial Goretex neochord placement x12 wT24 plication of posterior leaflet and 40 mm Sorin Memo 4D ring annuloplasty via right mini thoracotomy approach.  . UTIMarland Kitchen(urinary tract infection)     Past Surgical History:  Procedure Laterality Date  . ABDOMINAL HYSTERECTOMY     tah/bso  . APPENDECTOMY    . BUBBLE STUDY  08/09/2019   Procedure: BUBBLE STUDY;  Surgeon: O'NeaGeralynn Rile  [facility];;  . CHOLECYSTECTOMY    . KNEE SURGERY    . LAPAROSCOPY    . MITRAL VALVE REPAIR Right 08/30/2019   Procedure: MINIMALLY INVASIVE MITRAL VALVE REPAIR (MVR) USING MEMO 4D SIZE 40;  Surgeon: Owen,Rexene Alberts  [facility] [place] Open Heart Surgery;  Laterality: Right;  . RIGHT/LEFT HEART CATH AND CORONARY ANGIOGRAPHY N/A 08/09/2019   Procedure: RIGHT/LEFT HEART CATH AND CORONARY ANGIOGRAPHY;  Surgeon: End, Nelva Bush  [facility] [place];  Service: Cardiovascular;  Laterality: N/A;  . TEE WITHOUT CARDIOVERSION N/A 08/09/2019   Procedure: TRANSESOPHAGEAL ECHOCARDIOGRAM (TEE);  Surgeon: O'NeaGeralynn Rile  [facility];  Laterality: N/A;  . TEE WITHOUT CARDIOVERSION N/A 08/30/2019   Procedure: TRANSESOPHAGEAL ECHOCARDIOGRAM (TEE);  Surgeon: Owen,Rexene Alberts  [facility] [place]: Open Heart Surgery;  Laterality: N/A;  . [place]HY     EF 55-60%    Family History  Problem Relation Age of Onset  . Diabetes Mother   . Breast cancer Mother 41  .55ypertension Father   . Ovarian cancer Neg Hx   . Colon cancer Neg Hx   . Heart disease Neg Hx     Social History   Socioeconomic History  . Marital status: Divorced    Spouse name: Not on file  . Number of children: Not on file  . Years of education: Not on file  . Highest education level: Not on file  Occupational History  . Not on file  Tobacco Use  . Smoking status: Former Smoker    Packs/day: 0.25    Years: 20.00     Pack years: 5.00    Types: Cigarettes    Quit date: 07/24/2015    Years since quitting: 4.7  . Smokeless tobacco: Never Used  Vaping Use  . Vaping Use: Never assessed  Substance and Sexual Activity  . Alcohol use: Not Currently    Alcohol/week: 5.0 standard drinks    Types: 5 Standard drinks or equivalent per week  . Drug use: No  . Sexual activity: Not Currently    Birth control/protection: Surgical    Comment: Hysterectomy  Other Topics Concern  . Not on file  Social History Narrative  . Not on file   Social Determinants of Health   Financial Resource Strain:   . Difficulty of Paying Living Expenses: Not on file  Food Insecurity:   . Worried About RunniCharity fundraiserhe Last Year: Not on file  . Ran Out of Food in the Last Year: Not on  file  Transportation Needs:   . Film/video editor (Medical): Not on file  . Lack of Transportation (Non-Medical): Not on file  Physical Activity:   . Days of Exercise per Week: Not on file  . Minutes of Exercise per Session: Not on file  Stress:   . Feeling of Stress : Not on file  Social Connections:   . Frequency of Communication with Friends and Family: Not on file  . Frequency of Social Gatherings with Friends and Family: Not on file  . Attends Religious Services: Not on file  . Active Member of Clubs or Organizations: Not on file  . Attends Archivist Meetings: Not on file  . Marital Status: Not on file  Intimate Partner Violence:   . Fear of Current or Ex-Partner: Not on file  . Emotionally Abused: Not on file  . Physically Abused: Not on file  . Sexually Abused: Not on file    Outpatient Medications Prior to Visit  Medication Sig Dispense Refill  . aspirin EC 81 MG tablet Take 1 tablet (81 mg total) by mouth daily.    . Insulin Pen Needle (PEN NEEDLES) 31G X 6 MM MISC Use to inject Victoza 100 each 3  . losartan (COZAAR) 25 MG tablet TAKE 1 TABLET BY MOUTH EVERY DAY 90 tablet 0  . metFORMIN (GLUCOPHAGE)  500 MG tablet Take 1 tablet (500 mg total) by mouth 2 (two) times daily with a meal. 180 tablet 3  . metoprolol succinate (TOPROL-XL) 25 MG 24 hr tablet Take 1 tablet (25 mg total) by mouth daily. Please keep upcoming appointment for further refills. Thanks! 30 tablet 0  . ONETOUCH VERIO test strip USE AS INSTRUCTED USE TO TEST BLOOD GLUCOSE DAILY. DX E11.9 100 strip 3  . potassium chloride SA (KLOR-CON M20) 20 MEQ tablet Take 1 tab twice daily for 3 days and then 1 tab daily 90 tablet 1  . rosuvastatin (CRESTOR) 40 MG tablet Take 1 tablet (40 mg total) by mouth daily. 90 tablet 3  . VICTOZA 18 MG/3ML SOPN Inject 0.3 mLs (1.8 mg total) into the skin daily. 5 pen 3  . zolpidem (AMBIEN CR) 6.25 MG CR tablet Take 1 tablet (6.25 mg total) by mouth at bedtime as needed for sleep. 30 tablet 0  . benzonatate (TESSALON) 100 MG capsule Take 1 capsule (100 mg total) by mouth 3 (three) times daily as needed for cough. 21 capsule 0  . furosemide (LASIX) 40 MG tablet Take 1 tablet (40 mg total) by mouth as needed for fluid or edema. 90 tablet 1  . losartan (COZAAR) 50 MG tablet Take 25 mg by mouth daily.    Marland Kitchen warfarin (COUMADIN) 5 MG tablet TAKE 1.5 TABLETS ON MONDAYS AND WEDNESDAYS. TAKE 1 TABLET ON ALL OTHER DAYS. AS DIRECTED BY THE COUMADIN CLINIC. 32 tablet 1   No facility-administered medications prior to visit.    Allergies  Allergen Reactions  . Penicillins Other (See Comments)    Convulsions  Did it involve swelling of the face/tongue/throat, SOB, or low BP? No Did it involve sudden or severe rash/hives, skin peeling, or any reaction on the inside of your mouth or nose? No Did you need to seek medical attention at a hospital or doctor's office? No When did it last happen?Childhood allergy If all above answers are "NO", may proceed with cephalosporin use.     Review of Systems  Constitutional: Negative.        No residual Covid symptoms.  HENT: Negative.   Eyes: Negative.     Respiratory: Negative.   Cardiovascular: Negative.   Gastrointestinal: Negative.   Endocrine: Negative.   Genitourinary: Negative.   Musculoskeletal: Negative.   Skin: Negative.   Neurological: Negative.   Hematological: Negative.   Psychiatric/Behavioral: Negative.        No concerns about depression or anxiety.      Objective:    Physical Exam Vitals reviewed.  Constitutional:      Appearance: Normal appearance. She is normal weight.  Eyes:     Pupils: Pupils are equal, round, and reactive to light.  Cardiovascular:     Rate and Rhythm: Normal rate and regular rhythm.     Pulses: Normal pulses.     Heart sounds: Normal heart sounds. No murmur heard.   Pulmonary:     Effort: Pulmonary effort is normal.     Breath sounds: Normal breath sounds.  Abdominal:     Palpations: Abdomen is soft.     Tenderness: There is no abdominal tenderness.  Musculoskeletal:        General: Normal range of motion.     Cervical back: Normal range of motion.  Skin:    General: Skin is warm and dry.  Neurological:     General: No focal deficit present.     Mental Status: She is alert and oriented to person, place, and time.  Psychiatric:        Mood and Affect: Mood normal.        Behavior: Behavior normal.     BP 108/68 (BP Location: Left Arm, Patient Position: Sitting, Cuff Size: Normal)   Pulse 91   Temp 98.4 F (36.9 C) (Oral)   Ht _0  (1.702 m)   Wt 173 lb (78.5 kg)   LMP  (LMP Unknown)   SpO2 97%   BMI 27.10 kg/m  Wt Readings from Last 3 Encounters:  04/11/20 173 lb (78.5 kg)  12/05/19 182 lb 14.4 oz (83 kg)  11/30/19 180 lb 9.6 oz (81.9 kg)   Pulse Readings from Last 3 Encounters:  04/11/20 91  02/27/20 92  11/30/19 90    BP Readings from Last 3 Encounters:  04/11/20 108/68  02/27/20 107/69  11/30/19 118/62    Lab Results  Component Value Date   CHOL 121 04/09/2020   HDL 43.20 04/09/2020   LDLCALC 91 11/30/2019   LDLDIRECT 47.0 04/09/2020   TRIG 252.0  (H) 04/09/2020   CHOLHDL 3 04/09/2020      Health Maintenance Due  Topic Date Due  . TETANUS/TDAP  Never done  . INFLUENZA VACCINE  03/23/2020    There are no preventive care reminders to display for this patient.  Lab Results  Component Value Date   TSH 1.540 10/19/2019   Lab Results  Component Value Date   WBC 7.2 10/19/2019   HGB 13.5 10/19/2019   HCT 40.7 10/19/2019   MCV 86 10/19/2019   PLT 226 10/19/2019   Lab Results  Component Value Date   NA 142 04/09/2020   K 2.8 (LL) 04/09/2020   CO2 35 (H) 04/09/2020   GLUCOSE 103 (H) 04/09/2020   BUN 10 04/09/2020   CREATININE 0.88 04/09/2020   BILITOT 0.8 04/09/2020   ALKPHOS 56 04/09/2020   AST 28 04/09/2020   ALT 22 04/09/2020   PROT 7.0 04/09/2020   ALBUMIN 4.5 04/09/2020   CALCIUM 9.4 04/09/2020   ANIONGAP 14 10/24/2019   GFR 64.73 04/09/2020   Lab Results  Component  Value Date   CHOL 121 04/09/2020   Lab Results  Component Value Date   HDL 43.20 04/09/2020   Lab Results  Component Value Date   LDLCALC 91 11/30/2019   Lab Results  Component Value Date   TRIG 252.0 (H) 04/09/2020   Lab Results  Component Value Date   CHOLHDL 3 04/09/2020   Lab Results  Component Value Date   HGBA1C 8.2 (H) 11/30/2019      Assessment & Plan:   Problem List Items Addressed This Visit      Cardiovascular and Mediastinum   Essential hypertension    Blood pressure is at goal continue with current medication.Remains on losartan 25 mg daily, Toprol-XL 25 mg daily.         Digestive   Hepatic steatosis    Ct angio found this and no cirrhosis. Recent LFTs are normal, specialty liver labs negative for autoimmune, Wilson's, alpha-1 antitrypsin, and she has she needs a Twinrix vaccine . Patient has no right upper quadrant pain.  She is working on diet and exercise.         Endocrine   Type 2 diabetes mellitus with complication, without long-term current use of insulin (HCC)     A1c 8.2 in April. Not at goal  <8.0  Remains on Victoza 1.8 mg daily, Metformin 500 mg twice daily.  She does not check blood sugars at home.  Needs urine microalbumin.  Foot exam negative in April.  Needs documented dilated eye exam.       Relevant Orders   Hemoglobin A1c     Other   HLD (hyperlipidemia)    At goal: Crestor 40 mg daily and tolerating the dose increase from April well.  She has noted no myalgias.  Triglycerides are improving, LDL at goal       Encounter for screening mammogram for malignant neoplasm of breast - Primary   Relevant Orders   MM 3D SCREEN BREAST BILATERAL      No orders of the defined types were placed in this encounter.  Please get records from [facility] re her last colonoscopy and when she is due.  She needs lab appt for next Mon or Tues to check th potassium.   Please call and schedule your 3D mammogram as discussed.   [facility]  [address] [place], [place] [phone]  Flu shot today- had to leave unable to wait.  You are in need for Shingles vaccine - please return at your convenience for first vaccine to be followed in 2-6 month with the booster.   Please return in 3 mos for f/up visit and labs , micro albumin, Twin RX vaccine discussion   Follow-up: Return in about 3 months (around 07/12/2020).  This visit occurred during the SARS-CoV-2 public health emergency.  Safety protocols were in place, including screening questions prior to the visit, additional usage of staff PPE, and extensive cleaning of exam room while observing appropriate contact time as indicated for disinfecting solutions.   Denice Paradise, NP

## 2020-04-11 NOTE — Patient Instructions (Addendum)
Please get records from [facility] re her last colonoscopy and when she is due.    She needs lab appt for next Mon or Tues to check th potassium.    Please call and schedule your 3D mammogram as discussed.   [facility]  [address] [place], [place] [phone]   Flu shot today  You are in need for Shingles vaccine - please return at your convenience for first vaccine to be followed in 2-6 month with the booster.   Please return in 3 mos for f/up visit and labs.

## 2020-04-14 ENCOUNTER — Other Ambulatory Visit (INDEPENDENT_AMBULATORY_CARE_PROVIDER_SITE_OTHER): Payer: Managed Care, Other (non HMO)

## 2020-04-14 ENCOUNTER — Other Ambulatory Visit: Payer: Self-pay

## 2020-04-14 DIAGNOSIS — E876 Hypokalemia: Secondary | ICD-10-CM

## 2020-04-15 ENCOUNTER — Encounter: Payer: Self-pay | Admitting: Nurse Practitioner

## 2020-04-15 DIAGNOSIS — K760 Fatty (change of) liver, not elsewhere classified: Secondary | ICD-10-CM

## 2020-04-15 HISTORY — DX: Fatty (change of) liver, not elsewhere classified: K76.0

## 2020-04-15 LAB — BASIC METABOLIC PANEL
BUN: 6 mg/dL (ref 6–23)
CO2: 28 mEq/L (ref 19–32)
Calcium: 9.2 mg/dL (ref 8.4–10.5)
Chloride: 101 mEq/L (ref 96–112)
Creatinine, Ser: 0.83 mg/dL (ref 0.40–1.20)
GFR: 69.25 mL/min (ref >60.00)
Glucose, Bld: 103 mg/dL — ABNORMAL HIGH (ref 70–99)
Potassium: 3.2 mEq/L — ABNORMAL LOW (ref 3.5–5.1)
Sodium: 141 mEq/L (ref 135–145)

## 2020-04-15 MED ORDER — POTASSIUM CHLORIDE ER 10 MEQ PO TBCR: 10.0000 meq | EXTENDED_RELEASE_TABLET | Freq: Every day | ORAL | 0 refills | Status: DC

## 2020-04-15 NOTE — Addendum Note (Signed)
Addended by: Amedeo Kinsman A on: 04/15/2020 02:10 PM   Modules accepted: Orders

## 2020-04-15 NOTE — Assessment & Plan Note (Signed)
Ct angio found this and no cirrhosis. Recent LFTs are normal, specialty liver labs negative for autoimmune, Wilson's, alpha-1 antitrypsin, and she has she needs a Twinrix vaccine . Patient has no right upper quadrant pain.  She is working on diet and exercise.

## 2020-04-15 NOTE — Assessment & Plan Note (Signed)
Blood pressure is at goal continue with current medication.Remains on losartan 25 mg daily, Toprol-XL 25 mg daily.

## 2020-04-15 NOTE — Assessment & Plan Note (Signed)
At goal: Crestor 40 mg daily and tolerating the dose increase from April well.  She has noted no myalgias.  Triglycerides are improving, LDL at goal

## 2020-04-15 NOTE — Assessment & Plan Note (Addendum)
A1c 8.2 in April. Not at goal <8.0  Remains on Victoza 1.8 mg daily, Metformin 500 mg twice daily.  She does not check blood sugars at home.  Needs urine microalbumin.  Foot exam negative in April.  12/27/2019: dilated eye exam.

## 2020-04-16 ENCOUNTER — Telehealth: Payer: Self-pay | Admitting: Nurse Practitioner

## 2020-04-16 NOTE — Telephone Encounter (Signed)
Hi Kim,   Absolutely! I'll collect and send it  your way. We typically do them in the office and get results back the next day, but if you want results same day I can have [facility] on [facility] way out and the result will be back sooner. I'll plan on collecting in clinic unless you let me know you would prefer same day result. I've set myself a reminder.  Best, [employer]

## 2020-04-16 NOTE — Telephone Encounter (Signed)
Thank you and it is not stat.

## 2020-04-16 NOTE — Telephone Encounter (Signed)
Luther Parody,    Can you draw a Bmet on her when she comes to see you 05/02/20?  If you are not able to do that in your clinic, please  let me know. Thank you.   Selena Batten

## 2020-04-18 ENCOUNTER — Ambulatory Visit: Payer: Managed Care, Other (non HMO) | Admitting: Family

## 2020-04-30 ENCOUNTER — Other Ambulatory Visit: Payer: Self-pay | Admitting: Internal Medicine

## 2020-05-01 ENCOUNTER — Other Ambulatory Visit: Payer: Self-pay | Admitting: Nurse Practitioner

## 2020-05-01 DIAGNOSIS — E1169 Type 2 diabetes mellitus with other specified complication: Secondary | ICD-10-CM

## 2020-05-02 ENCOUNTER — Ambulatory Visit: Payer: Commercial Managed Care - PPO | Admitting: Family

## 2020-05-02 ENCOUNTER — Other Ambulatory Visit: Payer: Self-pay

## 2020-05-02 ENCOUNTER — Encounter: Payer: Self-pay | Admitting: Family

## 2020-05-02 VITALS — BP 110/80 | HR 85 | Ht 67.0 in | Wt 172.6 lb

## 2020-05-02 DIAGNOSIS — I5022 Chronic systolic (congestive) heart failure: Secondary | ICD-10-CM | POA: Diagnosis not present

## 2020-05-02 DIAGNOSIS — I10 Essential (primary) hypertension: Secondary | ICD-10-CM

## 2020-05-02 DIAGNOSIS — I2510 Atherosclerotic heart disease of native coronary artery without angina pectoris: Secondary | ICD-10-CM

## 2020-05-02 NOTE — Progress Notes (Signed)
Office Visit    Patient Name: Bonnie Ryan Date of Encounter: 05/02/2020  Primary Care Provider:  Theadore Nan, NP Primary Cardiologist:  Yvonne Kendall, MD Electrophysiologist:  None   Chief Complaint    Bonnie Ryan is a 64 y.o. female with a hx of MVP s/p minimally invasive MV repair January 2021 with post op atrial fibrillation, HTN, DM2, nonobstructive CAD   presents today for follow up of HFrEF.   Past Medical History    Past Medical History:  Diagnosis Date  . Diabetes mellitus    Previously diet-controlled  . Endometriosis   . Family history of adverse reaction to anesthesia    mother used to have vomiting after   . Hepatic steatosis 04/15/2020  . Hypercholesteremia   . Hypertension   . Mitral Valve Prolapse w/ Mitral regurgitation    a. 10/2009 Echo: Mod MVP, mild MR; b. 07/2019 Echo: EF 60-65%, Gr2 DD, Mod MR; c. 07/2019 TEE: EF 60-65%. No rwma. Mod dil LA. Sev MR w/ sev bileaflet prolapse. Mild TR; d. 08/2019 s/p minimally invasive MV repair.  . Non-obstructive CAD (coronary artery disease)    a. 09/2006 Cath: LAD 30, RCA 25; b.07/2011 Cath: LM Ca2+, 30-8mLAD, RCA 30-40p, EF 55-65%, 2+ MR; c. 07/2019 Cath: LM 62m/d, LAD 11m, RI nl, LCX 38m, RCA 45p, 7m.  Marland Kitchen Post-op atrial fibrillation (HCC)    a. 08/2019 following MV repair-->Amio/warfarin initiated.  Initially prolonged QT w/ IV amio-->converted to sinus.  . RLS (restless legs syndrome)   . S/P minimally invasive mitral valve repair 08/30/2019   a. 08/2019 s/p Complex valvuloplasty including artificial Goretex neochord placement x12 with plication of posterior leaflet and 40 mm Sorin Memo 4D ring annuloplasty via right mini thoracotomy approach.  Marland Kitchen UTI (urinary tract infection)    Past Surgical History:  Procedure Laterality Date  . ABDOMINAL HYSTERECTOMY     tah/bso  . APPENDECTOMY    . BUBBLE STUDY  08/09/2019   Procedure: BUBBLE STUDY;  Surgeon: Sande Rives, MD;  Location: [facility] ENDOSCOPY;   Service: Cardiology;;  . CHOLECYSTECTOMY    . KNEE SURGERY    . LAPAROSCOPY    . MITRAL VALVE REPAIR Right 08/30/2019   Procedure: MINIMALLY INVASIVE MITRAL VALVE REPAIR (MVR) USING MEMO 4D SIZE 40;  Surgeon: Purcell Nails, MD;  Location: [facility] OR;  Service: Open Heart Surgery;  Laterality: Right;  . RIGHT/LEFT HEART CATH AND CORONARY ANGIOGRAPHY N/A 08/09/2019   Procedure: RIGHT/LEFT HEART CATH AND CORONARY ANGIOGRAPHY;  Surgeon: Yvonne Kendall, MD;  Location: [facility] INVASIVE CV LAB;  Service: Cardiovascular;  Laterality: N/A;  . TEE WITHOUT CARDIOVERSION N/A 08/09/2019   Procedure: TRANSESOPHAGEAL ECHOCARDIOGRAM (TEE);  Surgeon: Sande Rives, MD;  Location: [facility] ENDOSCOPY;  Service: Cardiology;  Laterality: N/A;  . TEE WITHOUT CARDIOVERSION N/A 08/30/2019   Procedure: TRANSESOPHAGEAL ECHOCARDIOGRAM (TEE);  Surgeon: Purcell Nails, MD;  Location: [facility] OR;  Service: Open Heart Surgery;  Laterality: N/A;  . US ECHOCARDIOGRAPHY     EF 55-60%    Allergies  Allergies  Allergen Reactions  . Penicillins Other (See Comments)    Convulsions  Did it involve swelling of the face/tongue/throat, SOB, or low BP? No Did it involve sudden or severe rash/hives, skin peeling, or any reaction on the inside of your mouth or nose? No Did you need to seek medical attention at a hospital or doctor's office? No When did it last happen?Childhood allergy If all above answers are "NO", may proceed with cephalosporin use.  History of Present Illness    Bonnie Ryan is a 64 y.o. female with a hx of MVP s/p minimally invasive MV repair January 2021 with post op atrial fibrillation, HTN, DM2, nonobstructive CAD. She was last seen 10/31/19  Mitral valve prolapse diagnosed >10 years ago. Prior evaluation with normal LVef, moderate MR, and nonobstructive CAD by cath 2012. Established with Dr. Liliane Channel 2020 noting decline in functional capacity. Echocardiogram 06/2019 with normal LVEF and moderate to  severe MR. TEE with severe bileaflet MVP and severe MR. Diagnostic catheterization with nonobstructive CAD. Referred to CT surgery and underwent successful minimally invasive mitral valve repair on August 30, 2019. Postoperatively she had atrial fibrillation treated with IV amiodarone. Converted to SR and amiodarone was discontinued due to prolonged QT. After discussion with EP, started Amiodarone 200mg  daily. Follow up with A. Fib clinic 09/11/19 she was in atrial fib with fatigue. Amiodarone increased to 200mg  BID and then reduced to daily one week later as she was in SR with QTc 536. On 09/27/19 her Amiodarone was reduced to 100mg  daily due to QTc 520. She was in the office 10/19/19 for echocardiogram 10/19/2019 and noted to be symptomatic by the technician.  As such, she was transitioned into an office visit and noted worsening she is shortness of breath and dyspnea on exertion and lightheadedness within the last several weeks.  Orthostatic blood pressures were normal.  Her losartan was reduced due to low normal blood pressure with systolic 100-110.  Seen by Dr. 10/24/19.  Her echo was read as 30 to 35% on his personal review of images estimated closer to 45%.  Noted to not be unexpected have some decline in LVEF following treatment of severe MR.  Her furosemide 20 to 40 mg daily based on weight and edema was continued as well as her losartan 25 mg daily.  Her metoprolol tartrate was transitioned to metoprolol succinate.  Her amiodarone was discontinued as it was concerning to potentially be contributing to her symptoms.  Seen in follow up 10/31/19. She was maintaining NSR despite discontinuation of amiodarone.  Presents today for follow up. She had COVID in July and reports no residual symptoms.  Her warfarin has been discontinued. Reports no palpitations. She has gotten a labradoodle puppy since last seen and notices improvement in her sleep and enjoys being active taking the dog outside.   Reports no  shortness of breath nor dyspnea on exertion. Reports no chest pain, pressure, or tightness. No  orthopnea, PND. Reports no palpitations. She continues to take Lasix 40mg  a few times per week for intermittent lower extremity edema. She has been following with her PCP for hypokalemia.  EKGs/Labs/Other Studies Reviewed:   The following studies were reviewed today:  EKG:  EKG is ordered today.  The ekg ordered today demonstrates SR 85 bpm and single PVC with no acute ST/T wave changes  Recent Labs: 09/02/2019: Magnesium 2.1 10/19/2019: Hemoglobin 13.5; Platelets 226; TSH 1.540 04/09/2020: ALT 22 04/14/2020: BUN 6; Creatinine, Ser 0.83; Potassium 3.2; Sodium 141  Recent Lipid Panel    Component Value Date/Time   CHOL 121 04/09/2020 1528   TRIG 252.0 (H) 04/09/2020 1528   HDL 43.20 04/09/2020 1528   CHOLHDL 3 04/09/2020 1528   VLDL 50.4 (H) 04/09/2020 1528   LDLCALC 91 11/30/2019 1653   LDLDIRECT 47.0 04/09/2020 1528    Home Medications   Current Meds  Medication Sig  . aspirin EC 81 MG tablet Take 1 tablet (81 mg total) by mouth  daily.  . Insulin Pen Needle (PEN NEEDLES) 31G X 6 MM MISC Use to inject Victoza  . losartan (COZAAR) 25 MG tablet TAKE 1 TABLET BY MOUTH EVERY DAY  . metFORMIN (GLUCOPHAGE) 500 MG tablet Take 1 tablet (500 mg total) by mouth 2 (two) times daily with a meal.  . metoprolol succinate (TOPROL-XL) 25 MG 24 hr tablet TAKE 1 TABLET (25 MG TOTAL) BY MOUTH DAILY. PATIENT WILL CALL WHEN READY FOR REFILL.  Marland Kitchen ONETOUCH VERIO test strip USE AS INSTRUCTED USE TO TEST BLOOD GLUCOSE DAILY. DX E11.9  . rosuvastatin (CRESTOR) 40 MG tablet Take 1 tablet (40 mg total) by mouth daily.  Marland Kitchen VICTOZA 18 MG/3ML SOPN INJECT 1.8 MG UNDER THE SKIN ONCE DAILY  . zolpidem (AMBIEN CR) 6.25 MG CR tablet Take 1 tablet (6.25 mg total) by mouth at bedtime as needed for sleep.    Review of Systems      Review of Systems  Constitutional: Negative for chills, fever and malaise/fatigue.    Cardiovascular: Positive for leg swelling. Negative for chest pain, dyspnea on exertion, irregular heartbeat, near-syncope, orthopnea, palpitations and syncope.  Respiratory: Negative for cough, shortness of breath and wheezing.   Gastrointestinal: Negative for melena, nausea and vomiting.  Genitourinary: Negative for hematuria.  Neurological: Negative for dizziness, light-headedness and weakness.   All other systems reviewed and are otherwise negative except as noted above.  Physical Exam    VS:  BP 110/80 (BP Location: Left Arm, Patient Position: Sitting, Cuff Size: Normal)   Pulse 85   Ht 5\' 7"  (1.702 m)   Wt 172 lb 9.6 oz (78.3 kg)   LMP  (LMP Unknown)   SpO2 97%   BMI 27.03 kg/m  , BMI Body mass index is 27.03 kg/m. GEN: Well nourished, well developed, in no acute distress. HEENT: normal. Neck: Supple, no JVD, carotid bruits, or masses. Cardiac: RRR, no murmurs, rubs, or gallops. No clubbing, cyanosis, edema.  Radials/PT 2+ and equal bilaterally.  Respiratory:  Respirations regular and unlabored, clear to auscultation bilaterally. GI: Soft, nontender, nondistended, BS + x 4. MS: No deformity or atrophy. Skin: Warm and dry, no rash. Neuro:  Strength and sensation are intact. Psych: Normal affect.  Assessment & Plan    1. Mitral regurgitation s/p repair - Euvolemic on exam . Echo 10/19/19 with LVEF read as 30-35% but 45% by review of Dr. 10/21/19. She politely declines repeat EKG today for monitoring. Will discuss timing of follow up echo with her primary cardiologist.  Not unexpected to have some decline in LVEF following treatment of severe MR. Continue Lasix 40mg  as needed for fluid retention based on weight. Tells me she does not feel the 20mg  is sufficient. Continue Losartan 25mg  daily and Toprol XL 25mg  daily.   2. Chronic systolic heart failure - EF recently reduced 30-35% (on Dr. Okey Dupre read, approx 45%). LVEF 60-65% prior to MV repair. Continue Losartan, Lasix, Toprol. NYHA I  with no dyspnea. Intermittent LE edema well controlled on Lasix 40mg  PRN.   3. Hypokalemia - Likely secondary to recent COVID infection in July and Lasix usage. BMP today for monitoring. She presently is taking KDur daily and Lasix 40mg  four times per week.  4. Postoperative atrial fibrillation - Maintaining NSR off amiodarone. Warfarin has been discontinued. No evidence of recurrent atrial fibrillation.  5. Nonobstructive CAD - Mild to moderate by preop cath. Continue medical therapy including aspirin, beta blocker, statin.   6. HLD - History of markedly elevated triglycerides. Recent  lab work with LDL <70 and improvement in triglycerides. Presently on Crestor 40mg   Disposition: Follow up in 4-6 month(s) with Dr. Okey DupreEnd or APP.    Alver Sorrowaitlin S Korin Hartwell, NP 05/02/2020, 3:21 PM

## 2020-05-02 NOTE — Patient Instructions (Addendum)
Medication Instructions:  No medication changes today.  *If you need a refill on your cardiac medications before your next appointment, please call your pharmacy*   Lab Work: Your physician recommends that you have lab work today: BMP  If you have labs (blood work) drawn today and your tests are completely normal, you will receive your results only by: Marland Kitchen MyChart Message (if you have MyChart) OR . A paper copy in the mail If you have any lab test that is abnormal or we need to change your treatment, we will call you to review the results.   Testing/Procedures: Your EKG today shows normal sinus rhythm with an occasional early beat. This is very common.   Follow-Up: At [facility], you and your health needs are our priority.  As part of our continuing mission to provide you with exceptional heart care, we have created designated Provider Care Teams.  These Care Teams include your primary Cardiologist (physician) and Advanced Practice Providers (APPs -  Physician Assistants and Nurse Practitioners) who all work together to provide you with the care you need, when you need it.  We recommend signing up for the patient portal called "MyChart".  Sign up information is provided on this After Visit Summary.  MyChart is used to connect with patients for Virtual Visits (Telemedicine).  Patients are able to view lab/test results, encounter notes, upcoming appointments, etc.  Non-urgent messages can be sent to your provider as well.   To learn more about what you can do with MyChart, go to [email or web address].    Your next appointment:   4-6 month(s)  The format for your next appointment:   In Person  Provider:    You may see Yvonne Kendall, MD or one of the following Advanced Practice Providers on your designated Care Team:   Nicolasa Ducking, NP  Eula Listen, PA-C  Gillian Shields, NP  Marisue Ivan, PA-C  Other Instructions

## 2020-05-03 LAB — BASIC METABOLIC PANEL
BUN/Creatinine Ratio: 11 — ABNORMAL LOW (ref 12–28)
BUN: 9 mg/dL (ref 8–27)
CO2: 28 mmol/L (ref 20–29)
Calcium: 9.6 mg/dL (ref 8.7–10.3)
Chloride: 98 mmol/L (ref 96–106)
Creatinine, Ser: 0.81 mg/dL (ref 0.57–1.00)
GFR calc Af Amer: 89 mL/min/{1.73_m2} (ref >59)
GFR calc non Af Amer: 78 mL/min/{1.73_m2} (ref >59)
Glucose: 151 mg/dL — ABNORMAL HIGH (ref 65–99)
Potassium: 3.2 mmol/L — ABNORMAL LOW (ref 3.5–5.2)
Sodium: 141 mmol/L (ref 134–144)

## 2020-05-04 ENCOUNTER — Other Ambulatory Visit: Payer: Self-pay | Admitting: Nurse Practitioner

## 2020-05-04 DIAGNOSIS — E876 Hypokalemia: Secondary | ICD-10-CM

## 2020-05-05 ENCOUNTER — Telehealth: Payer: Self-pay

## 2020-05-05 NOTE — Telephone Encounter (Signed)
Alver Sorrow, NP  P Cv Div [place] Triage Also, repeat BMP at [facility] in 1-2 weeks.    Attempted to call patient. [facility] 05/05/2020

## 2020-05-05 NOTE — Telephone Encounter (Signed)
-----   Message from Alver Sorrow, NP sent at 05/03/2020  8:59 AM EDT ----- Normal kidney function. Her potassium remains mildly depleted.   She has been taking Furosemide 40mg  about 4x per week since her MV repair. She has been taking Potassium daily. Recommend she take Potassium daily and take an additional on days she is taking Lasix.   Nursing team: Please confirm the doses/frequency of Furosemide and Potassium she has been taking. She was fairly certain in our office visit, but did not have pill bottles with her.

## 2020-05-07 NOTE — Telephone Encounter (Signed)
Call to patient to review labs.    Pt verbalized understanding and has no further questions at this time.    Advised pt to call for any further questions or concerns.  rx updated.

## 2020-05-10 ENCOUNTER — Other Ambulatory Visit: Payer: Self-pay | Admitting: Family

## 2020-05-10 DIAGNOSIS — I5022 Chronic systolic (congestive) heart failure: Secondary | ICD-10-CM

## 2020-05-16 ENCOUNTER — Encounter: Payer: Self-pay | Admitting: Nurse Practitioner

## 2020-06-01 ENCOUNTER — Other Ambulatory Visit: Payer: Self-pay | Admitting: Internal Medicine

## 2020-06-03 ENCOUNTER — Other Ambulatory Visit: Payer: Self-pay | Admitting: Nurse Practitioner

## 2020-06-03 ENCOUNTER — Encounter: Payer: Self-pay | Admitting: Nurse Practitioner

## 2020-06-03 ENCOUNTER — Other Ambulatory Visit: Payer: Self-pay

## 2020-06-03 DIAGNOSIS — G4700 Insomnia, unspecified: Secondary | ICD-10-CM

## 2020-06-03 HISTORY — DX: Insomnia, unspecified: G47.00

## 2020-06-03 MED ORDER — ZOLPIDEM TARTRATE ER 6.25 MG PO TBCR: 6.2500 mg | EXTENDED_RELEASE_TABLET | Freq: Every evening | ORAL | 0 refills | Status: DC | PRN

## 2020-06-03 MED ORDER — METFORMIN HCL 500 MG PO TABS: 500.0000 mg | ORAL_TABLET | Freq: Two times a day (BID) | ORAL | 3 refills | Status: DC

## 2020-06-03 NOTE — Progress Notes (Signed)
Please let her know Ambien is refilled. I checked the [place] PDMP  and no suspicious activity noted. She has a controlled substance contract dated April 2021 on file.

## 2020-06-04 NOTE — Progress Notes (Signed)
Spoke with patient and is aware rx was sent to pharmacy.

## 2020-06-11 ENCOUNTER — Other Ambulatory Visit: Payer: Self-pay | Admitting: Nurse Practitioner

## 2020-06-11 MED ORDER — ZOLPIDEM TARTRATE ER 6.25 MG PO TBCR: 6.2500 mg | EXTENDED_RELEASE_TABLET | Freq: Every evening | ORAL | 0 refills | Status: DC | PRN

## 2020-06-12 ENCOUNTER — Other Ambulatory Visit: Payer: Self-pay

## 2020-07-14 ENCOUNTER — Ambulatory Visit: Payer: Managed Care, Other (non HMO) | Admitting: Nurse Practitioner

## 2020-09-01 ENCOUNTER — Ambulatory Visit: Payer: Managed Care, Other (non HMO) | Admitting: Thoracic Surgery (Cardiothoracic Vascular Surgery)

## 2020-09-05 ENCOUNTER — Other Ambulatory Visit: Payer: Self-pay | Admitting: Internal Medicine

## 2020-09-08 ENCOUNTER — Ambulatory Visit: Payer: Commercial Managed Care - PPO | Admitting: Thoracic Surgery (Cardiothoracic Vascular Surgery)

## 2020-09-19 ENCOUNTER — Encounter: Payer: Self-pay | Admitting: Nurse Practitioner

## 2020-09-19 ENCOUNTER — Telehealth: Payer: Commercial Managed Care - PPO | Admitting: Nurse Practitioner

## 2020-09-19 DIAGNOSIS — N390 Urinary tract infection, site not specified: Secondary | ICD-10-CM

## 2020-09-19 MED ORDER — CIPROFLOXACIN HCL 500 MG PO TABS: 500.0000 mg | ORAL_TABLET | Freq: Two times a day (BID) | ORAL | 0 refills | Status: DC

## 2020-09-19 NOTE — Progress Notes (Signed)
Acute Office Visit  Subjective:    Patient ID: Bonnie Ryan, female    DOB: 1956-06-10, 65 y.o.   MRN: [id number]  Chief Complaint  Patient presents with  . Urinary Tract Infection    X3-4 days    HPI Patient is in today for UTI. She is having abdominal pain. [facility]   Past Medical History:  Diagnosis Date  . Diabetes mellitus    Previously diet-controlled  . Endometriosis   . Family history of adverse reaction to anesthesia    mother used to have vomiting after   . Hepatic steatosis 04/15/2020  . Hypercholesteremia   . Hypertension   . Insomnia 06/03/2020  . Mitral Valve Prolapse w/ Mitral regurgitation    a. 10/2009 Echo: Mod MVP, mild MR; b. 07/2019 Echo: EF 60-65%, Gr2 DD, Mod MR; c. 07/2019 TEE: EF 60-65%. No rwma. Mod dil LA. Sev MR w/ sev bileaflet prolapse. Mild TR; d. 08/2019 s/p minimally invasive MV repair.  . Non-obstructive CAD (coronary artery disease)    a. 09/2006 Cath: LAD 30, RCA 25; b.07/2011 Cath: LM Ca2+, 30-66mLAD, RCA 30-40p, EF 55-65%, 2+ MR; c. 07/2019 Cath: LM 35m/d, LAD 63m, RI nl, LCX 15m, RCA 45p, 14m.  Marland Kitchen Post-op atrial fibrillation (HCC)    a. 08/2019 following MV repair-->Amio/warfarin initiated.  Initially prolonged QT w/ IV amio-->converted to sinus.  . RLS (restless legs syndrome)   . S/P minimally invasive mitral valve repair 08/30/2019   a. 08/2019 s/p Complex valvuloplasty including artificial Goretex neochord placement x12 with plication of posterior leaflet and 40 mm Sorin Memo 4D ring annuloplasty via right mini thoracotomy approach.  Marland Kitchen UTI (urinary tract infection)     Past Surgical History:  Procedure Laterality Date  . ABDOMINAL HYSTERECTOMY     tah/bso  . APPENDECTOMY    . BUBBLE STUDY  08/09/2019   Procedure: BUBBLE STUDY;  Surgeon: Sande Rives, MD;  Location: [facility] ENDOSCOPY;  Service: Cardiology;;  . CHOLECYSTECTOMY    . KNEE SURGERY    . LAPAROSCOPY    . MITRAL VALVE REPAIR Right 08/30/2019    Procedure: MINIMALLY INVASIVE MITRAL VALVE REPAIR (MVR) USING MEMO 4D SIZE 40;  Surgeon: Purcell Nails, MD;  Location: [facility] OR;  Service: Open Heart Surgery;  Laterality: Right;  . RIGHT/LEFT HEART CATH AND CORONARY ANGIOGRAPHY N/A 08/09/2019   Procedure: RIGHT/LEFT HEART CATH AND CORONARY ANGIOGRAPHY;  Surgeon: Yvonne Kendall, MD;  Location: [facility] INVASIVE CV LAB;  Service: Cardiovascular;  Laterality: N/A;  . TEE WITHOUT CARDIOVERSION N/A 08/09/2019   Procedure: TRANSESOPHAGEAL ECHOCARDIOGRAM (TEE);  Surgeon: Sande Rives, MD;  Location: [facility] ENDOSCOPY;  Service: Cardiology;  Laterality: N/A;  . TEE WITHOUT CARDIOVERSION N/A 08/30/2019   Procedure: TRANSESOPHAGEAL ECHOCARDIOGRAM (TEE);  Surgeon: Purcell Nails, MD;  Location: [facility] OR;  Service: Open Heart Surgery;  Laterality: N/A;  . US ECHOCARDIOGRAPHY     EF 55-60%    Family History  Problem Relation Age of Onset  . Diabetes Mother   . Breast cancer Mother 11  . Hypertension Father   . Ovarian cancer Neg Hx   . Colon cancer Neg Hx   . Heart disease Neg Hx     Social History   Socioeconomic History  . Marital status: Divorced    Spouse name: Not on file  . Number of children: Not on file  . Years of education: Not on file  . Highest education level: Not on file  Occupational History  .  Not on file  Tobacco Use  . Smoking status: Former Smoker    Packs/day: 0.25    Years: 20.00    Pack years: 5.00    Types: Cigarettes    Quit date: 07/24/2015    Years since quitting: 5.1  . Smokeless tobacco: Never Used  Vaping Use  . Vaping Use: Not on file  Substance and Sexual Activity  . Alcohol use: Not Currently    Alcohol/week: 5.0 standard drinks    Types: 5 Standard drinks or equivalent per week  . Drug use: No  . Sexual activity: Not Currently    Birth control/protection: Surgical    Comment: Hysterectomy  Other Topics Concern  . Not on file  Social History Narrative  . Not on file   Social Determinants of  Health   Financial Resource Strain: Not on file  Food Insecurity: Not on file  Transportation Needs: Not on file  Physical Activity: Not on file  Stress: Not on file  Social Connections: Not on file  Intimate Partner Violence: Not on file    Outpatient Medications Prior to Visit  Medication Sig Dispense Refill  . furosemide (LASIX) 40 MG tablet TAKE 1 TABLET (40 MG TOTAL) BY MOUTH AS NEEDED FOR FLUID OR EDEMA. 90 tablet 1  . aspirin EC 81 MG tablet Take 1 tablet (81 mg total) by mouth daily.    . Insulin Pen Needle (PEN NEEDLES) 31G X 6 MM MISC Use to inject Victoza 100 each 3  . losartan (COZAAR) 25 MG tablet TAKE 1 TABLET BY MOUTH EVERY DAY 90 tablet 0  . metFORMIN (GLUCOPHAGE) 500 MG tablet TAKE 1 TABLET (500 MG TOTAL) BY MOUTH 2 (TWO) TIMES DAILY WITH A MEAL. 180 tablet 3  . metoprolol succinate (TOPROL-XL) 25 MG 24 hr tablet TAKE 1 TABLET (25 MG TOTAL) BY MOUTH DAILY. PATIENT WILL CALL WHEN READY FOR REFILL. 30 tablet 6  . ONETOUCH VERIO test strip USE AS INSTRUCTED USE TO TEST BLOOD GLUCOSE DAILY. DX E11.9 100 strip 3  . potassium chloride SA (KLOR-CON) 20 MEQ tablet Take 20 mEq by mouth daily. Take an additional 20 mEq (40 mEq total) on days you take lasix.    Marland Kitchen rosuvastatin (CRESTOR) 40 MG tablet Take 1 tablet (40 mg total) by mouth daily. 90 tablet 3  . VICTOZA 18 MG/3ML SOPN INJECT 1.8 MG UNDER THE SKIN ONCE DAILY 9 mL 6  . zolpidem (AMBIEN CR) 6.25 MG CR tablet Take 1 tablet (6.25 mg total) by mouth at bedtime as needed for sleep. 30 tablet 0   No facility-administered medications prior to visit.    Allergies  Allergen Reactions  . Penicillins Other (See Comments)    Convulsions  Did it involve swelling of the face/tongue/throat, SOB, or low BP? No Did it involve sudden or severe rash/hives, skin peeling, or any reaction on the inside of your mouth or nose? No Did you need to seek medical attention at a hospital or doctor's office? No When did it last  happen?Childhood allergy If all above answers are "NO", may proceed with cephalosporin use.     Review of Systems  Gastrointestinal: Positive for abdominal pain.  Genitourinary: Positive for dysuria. Negative for difficulty urinating, flank pain, frequency, hematuria and urgency.       Objective:    Physical Exam  LMP  (LMP Unknown)  Wt Readings from Last 3 Encounters:  05/02/20 172 lb 9.6 oz (78.3 kg)  04/11/20 173 lb (78.5 kg)  12/05/19 182 lb  14.4 oz (83 kg)    Health Maintenance Due  Topic Date Due  . TETANUS/TDAP  Never done  . COLONOSCOPY (Pts 45-88yrs Insurance coverage will need to be confirmed)  Never done  . INFLUENZA VACCINE  03/23/2020  . HEMOGLOBIN A1C  05/31/2020  . MAMMOGRAM  09/19/2020    There are no preventive care reminders to display for this patient.   Lab Results  Component Value Date   TSH 1.540 10/19/2019   Lab Results  Component Value Date   WBC 7.2 10/19/2019   HGB 13.5 10/19/2019   HCT 40.7 10/19/2019   MCV 86 10/19/2019   PLT 226 10/19/2019   Lab Results  Component Value Date   NA 141 05/02/2020   K 3.2 (L) 05/02/2020   CO2 28 05/02/2020   GLUCOSE 151 (H) 05/02/2020   BUN 9 05/02/2020   CREATININE 0.81 05/02/2020   BILITOT 0.8 04/09/2020   ALKPHOS 56 04/09/2020   AST 28 04/09/2020   ALT 22 04/09/2020   PROT 7.0 04/09/2020   ALBUMIN 4.5 04/09/2020   CALCIUM 9.6 05/02/2020   ANIONGAP 14 10/24/2019   GFR 69.25 04/14/2020   Lab Results  Component Value Date   CHOL 121 04/09/2020   Lab Results  Component Value Date   HDL 43.20 04/09/2020   Lab Results  Component Value Date   LDLCALC 91 11/30/2019   Lab Results  Component Value Date   TRIG 252.0 (H) 04/09/2020   Lab Results  Component Value Date   CHOLHDL 3 04/09/2020   Lab Results  Component Value Date   HGBA1C 8.2 (H) 11/30/2019       Assessment & Plan:   Problem List Items Addressed This Visit      Genitourinary   UTI (urinary tract  infection)    -she states [facility] and is having dysuria x 3-4 days that is getting worse -Rx. cipro -we discussed that [facility] test will not get a culture or test for antibiotic resistance -if symptoms get worse or fail to resolve over the weekend, she will need to see her PCP          Meds ordered this encounter  Medications  . ciprofloxacin (CIPRO) 500 MG tablet    Sig: Take 1 tablet (500 mg total) by mouth 2 (two) times daily.    Dispense:  14 tablet    Refill:  0   Date:  09/19/2020   Location of Patient: Home Location of Provider: Office Consent was obtain for visit to be over via telehealth. I verified that I am speaking with the correct person using two identifiers.  I connected with  Bonnie Ryan on 09/19/20 via telephone and verified that I am speaking with the correct person using two identifiers.   I discussed the limitations of evaluation and management by telemedicine. The patient expressed understanding and agreed to proceed.  Time spent: 9 min   Heather Roberts, NP

## 2020-09-19 NOTE — Assessment & Plan Note (Signed)
-  she states she took [facility] showed [facility] and is having dysuria x 3-4 days that is getting worse -Rx. cipro -we discussed that [facility] will not get a culture or test for antibiotic resistance -if symptoms get worse or fail to resolve over the weekend, she will need to see her PCP

## 2020-09-29 ENCOUNTER — Ambulatory Visit: Payer: Self-pay | Admitting: Thoracic Surgery (Cardiothoracic Vascular Surgery)

## 2020-10-08 ENCOUNTER — Other Ambulatory Visit: Payer: Self-pay

## 2020-10-08 ENCOUNTER — Ambulatory Visit: Payer: Commercial Managed Care - PPO | Admitting: Internal Medicine

## 2020-10-08 ENCOUNTER — Encounter: Payer: Self-pay | Admitting: Internal Medicine

## 2020-10-08 ENCOUNTER — Ambulatory Visit (INDEPENDENT_AMBULATORY_CARE_PROVIDER_SITE_OTHER): Payer: Commercial Managed Care - PPO

## 2020-10-08 VITALS — BP 110/70 | HR 88 | Ht 67.0 in | Wt 184.4 lb

## 2020-10-08 DIAGNOSIS — I5022 Chronic systolic (congestive) heart failure: Secondary | ICD-10-CM | POA: Diagnosis not present

## 2020-10-08 DIAGNOSIS — I2510 Atherosclerotic heart disease of native coronary artery without angina pectoris: Secondary | ICD-10-CM

## 2020-10-08 DIAGNOSIS — I340 Nonrheumatic mitral (valve) insufficiency: Secondary | ICD-10-CM | POA: Diagnosis not present

## 2020-10-08 DIAGNOSIS — N390 Urinary tract infection, site not specified: Secondary | ICD-10-CM

## 2020-10-08 DIAGNOSIS — R002 Palpitations: Secondary | ICD-10-CM

## 2020-10-08 DIAGNOSIS — Z8679 Personal history of other diseases of the circulatory system: Secondary | ICD-10-CM

## 2020-10-08 DIAGNOSIS — E785 Hyperlipidemia, unspecified: Secondary | ICD-10-CM

## 2020-10-08 DIAGNOSIS — E1169 Type 2 diabetes mellitus with other specified complication: Secondary | ICD-10-CM

## 2020-10-08 NOTE — Progress Notes (Signed)
Follow-up Outpatient Visit Date: 10/08/2020  Primary Care Provider: Patient, No Pcp Per No address on file  Chief Complaint: Follow-up mitral regurgitation  HPI:  Bonnie Ryan is a 65 y.o. female with history of mitral valve prolapse with severe regurgitation status post mitral valve repair in 08/2019 complicated by postoperative atrial fibrillation, nonobstructive coronary artery disease, hypertension, and type 2 diabetes mellitus, who presents for follow-up of mitral valve disease.  She was last seen in our office by Gillian Shields, NP, 04/2020.  At that time, she was feeling well, having recovered from COVID-19 in 02/2020.  She was using furosemide a few times a week for intermittent dependent edema.  Today, [name] reports that she continues to feel better and is back at work.  She still notes that her energy is not normal.  She reports occasional palpitations over the last 2 months, describing it as a "fish flopping" in her chest.  It happened about 6 times, most recently 10 to 14 days ago.  Episodes usually last 3 to 4 minutes, always when she is lying down.  There are no associated symptoms.  She has noticed a couple of random "twinges" of chest pain that are not exertional.  She denies significant shortness of breath as well as edema.  She has occasional orthostatic lightheadedness but has not passed out or fallen.  --------------------------------------------------------------------------------------------------  Past Medical History:  Diagnosis Date  . Diabetes mellitus    Previously diet-controlled  . Endometriosis   . Family history of adverse reaction to anesthesia    mother used to have vomiting after   . Hepatic steatosis 04/15/2020  . Hypercholesteremia   . Hypertension   . Insomnia 06/03/2020  . Mitral Valve Prolapse w/ Mitral regurgitation    a. 10/2009 Echo: Mod MVP, mild MR; b. 07/2019 Echo: EF 60-65%, Gr2 DD, Mod MR; c. 07/2019 TEE: EF 60-65%. No rwma. Mod dil LA. Sev  MR w/ sev bileaflet prolapse. Mild TR; d. 08/2019 s/p minimally invasive MV repair.  . Non-obstructive CAD (coronary artery disease)    a. 09/2006 Cath: LAD 30, RCA 25; b.07/2011 Cath: LM Ca2+, 30-85mLAD, RCA 30-40p, EF 55-65%, 2+ MR; c. 07/2019 Cath: LM 21m/d, LAD 36m, RI nl, LCX 52m, RCA 45p, 56m.  Marland Kitchen Post-op atrial fibrillation (HCC)    a. 08/2019 following MV repair-->Amio/warfarin initiated.  Initially prolonged QT w/ IV amio-->converted to sinus.  . RLS (restless legs syndrome)   . S/P minimally invasive mitral valve repair 08/30/2019   a. 08/2019 s/p Complex valvuloplasty including artificial Goretex neochord placement x12 with plication of posterior leaflet and 40 mm Sorin Memo 4D ring annuloplasty via right mini thoracotomy approach.  Marland Kitchen UTI (urinary tract infection)    Past Surgical History:  Procedure Laterality Date  . ABDOMINAL HYSTERECTOMY     tah/bso  . APPENDECTOMY    . BUBBLE STUDY  08/09/2019   Procedure: BUBBLE STUDY;  Surgeon: Sande Rives, MD;  Location: [facility] ENDOSCOPY;  Service: Cardiology;;  . CHOLECYSTECTOMY    . KNEE SURGERY    . LAPAROSCOPY    . MITRAL VALVE REPAIR Right 08/30/2019   Procedure: MINIMALLY INVASIVE MITRAL VALVE REPAIR (MVR) USING MEMO 4D SIZE 40;  Surgeon: Purcell Nails, MD;  Location: [facility] OR;  Service: Open Heart Surgery;  Laterality: Right;  . RIGHT/LEFT HEART CATH AND CORONARY ANGIOGRAPHY N/A 08/09/2019   Procedure: RIGHT/LEFT HEART CATH AND CORONARY ANGIOGRAPHY;  Surgeon: Yvonne Kendall, MD;  Location: [facility] INVASIVE CV LAB;  Service: Cardiovascular;  Laterality:  N/A;  . TEE WITHOUT CARDIOVERSION N/A 08/09/2019   Procedure: TRANSESOPHAGEAL ECHOCARDIOGRAM (TEE);  Surgeon: Sande Rives, MD;  Location: [facility] ENDOSCOPY;  Service: Cardiology;  Laterality: N/A;  . TEE WITHOUT CARDIOVERSION N/A 08/30/2019   Procedure: TRANSESOPHAGEAL ECHOCARDIOGRAM (TEE);  Surgeon: Purcell Nails, MD;  Location: [facility] OR;  Service: Open Heart Surgery;  Laterality:  N/A;  . US ECHOCARDIOGRAPHY     EF 55-60%    Current Meds  Medication Sig  . aspirin EC 81 MG tablet Take 1 tablet (81 mg total) by mouth daily.  . furosemide (LASIX) 40 MG tablet TAKE 1 TABLET (40 MG TOTAL) BY MOUTH AS NEEDED FOR FLUID OR EDEMA.  . Insulin Pen Needle (PEN NEEDLES) 31G X 6 MM MISC Use to inject Victoza  . losartan (COZAAR) 25 MG tablet TAKE 1 TABLET BY MOUTH EVERY DAY  . metFORMIN (GLUCOPHAGE) 500 MG tablet TAKE 1 TABLET (500 MG TOTAL) BY MOUTH 2 (TWO) TIMES DAILY WITH A MEAL.  . metoprolol succinate (TOPROL-XL) 25 MG 24 hr tablet TAKE 1 TABLET (25 MG TOTAL) BY MOUTH DAILY. PATIENT WILL CALL WHEN READY FOR REFILL.  Marland Kitchen ONETOUCH VERIO test strip USE AS INSTRUCTED USE TO TEST BLOOD GLUCOSE DAILY. DX E11.9  . potassium chloride SA (KLOR-CON) 20 MEQ tablet Take 20 mEq by mouth daily. Take an additional 20 mEq (40 mEq total) on days you take lasix.  Marland Kitchen rosuvastatin (CRESTOR) 40 MG tablet Take 1 tablet (40 mg total) by mouth daily.  Marland Kitchen VICTOZA 18 MG/3ML SOPN INJECT 1.8 MG UNDER THE SKIN ONCE DAILY  . zolpidem (AMBIEN CR) 6.25 MG CR tablet Take 1 tablet (6.25 mg total) by mouth at bedtime as needed for sleep.    Allergies: Penicillins  Social History   Tobacco Use  . Smoking status: Former Smoker    Packs/day: 0.25    Years: 20.00    Pack years: 5.00    Types: Cigarettes    Quit date: 07/24/2015    Years since quitting: 5.2  . Smokeless tobacco: Never Used  Substance Use Topics  . Alcohol use: Not Currently    Alcohol/week: 5.0 standard drinks    Types: 5 Standard drinks or equivalent per week  . Drug use: No    Family History  Problem Relation Age of Onset  . Diabetes Mother   . Breast cancer Mother 33  . Hypertension Father   . Ovarian cancer Neg Hx   . Colon cancer Neg Hx   . Heart disease Neg Hx     Review of Systems: [name] recently had urinary tract symptoms and was prescribed an through a telehealth visit.  Symptoms have improved though she still  has some urinary frequency.  Otherwise, a 12-system review of systems was performed and was negative except as noted in the HPI.  --------------------------------------------------------------------------------------------------  Physical Exam: BP 110/70 (BP Location: Left Arm, Patient Position: Sitting, Cuff Size: Normal)   Pulse 88   Ht 5\' 7"  (1.702 m)   Wt 184 lb 6 oz (83.6 kg)   LMP  (LMP Unknown)   SpO2 98%   BMI 28.88 kg/m   General:  NAD. Neck: No JVD or HJR. Lungs: Clear to auscultation bilaterally without wheezes or crackles. Heart: Regular rate and rhythm without murmurs, rubs, or gallops. Abdomen: Soft, nontender, nondistended. Extremities: No lower extremity edema.  EKG: Normal sinus rhythm with first-degree AV block, low voltage, nonspecific ST changes, and mild QT prolongation.  Compared with prior tracing from 05/02/2020, PVCs are  no longer present.  PR interval is minimally longer.  Lab Results  Component Value Date   WBC 7.2 10/19/2019   HGB 13.5 10/19/2019   HCT 40.7 10/19/2019   MCV 86 10/19/2019   PLT 226 10/19/2019    Lab Results  Component Value Date   NA 141 05/02/2020   K 3.2 (L) 05/02/2020   CL 98 05/02/2020   CO2 28 05/02/2020   BUN 9 05/02/2020   CREATININE 0.81 05/02/2020   GLUCOSE 151 (H) 05/02/2020   ALT 22 04/09/2020    Lab Results  Component Value Date   CHOL 121 04/09/2020   HDL 43.20 04/09/2020   LDLCALC 91 11/30/2019   LDLDIRECT 47.0 04/09/2020   TRIG 252.0 (H) 04/09/2020   CHOLHDL 3 04/09/2020    --------------------------------------------------------------------------------------------------  ASSESSMENT AND PLAN: Mitral regurgitation status post repair: Overall, Ms. Wardrip is doing much better though she feels like her stamina is still not all the way back to normal.  Some of this may be due to deconditioning, and I have encouraged her to gradually increase her activity as tolerated.  She does not have any signs or  symptoms of overt heart failure today.  Importance of SBE prophylaxis was reinforced.  Palpitations and history of atrial fibrillation: EKG today shows sinus rhythm though Ms. Ferring has experienced some intermittent palpitations over the last few months.  It should be noted that she had postoperative atrial fibrillation following her mitral valve repair.  Her history of significant mitral valve disease also places her at increased risk for recurrent atrial fibrillation.  We have therefore agreed to perform a 14-day event monitor for further characterization.  Chronic HFrEF: Patient appears euvolemic with improving symptoms and no obvious findings of overt heart failure.  Echo shortly after mitral valve repair showed LVEF of 30-35%.  We will continue current regimen of losartan and metoprolol.  We will also arrange for a repeat echo at the patient's convenience to reassess her LVEF and mitral valve.  Hyperlipidemia associated with type 2 diabetes mellitus: LDL well controlled on last check.  Continue current medications in the setting of moderate CAD by catheterization last year.  Coronary artery disease: Moderate CAD noted on cath prior to mitral valve repair.  I do not believe that her exertional dyspnea is driven primarily by CAD.  Continue aspirin and statin therapy.  Urinary tract infection: Patient recently with UTI symptoms treated with a course of antibiotics.  Symptoms have improved though she still has some urgency.  I encouraged her to reach out to her PCP or be evaluated at urgent care to ensure that her UTI has been adequately treated.  Follow-up: Return to clinic in 3 months.  Yvonne Kendall, MD 10/08/2020 3:26 PM

## 2020-10-08 NOTE — Patient Instructions (Signed)
Medication Instructions:  Your physician recommends that you continue on your current medications as directed. Please refer to the Current Medication list given to you today.  *If you need a refill on your cardiac medications before your next appointment, please call your pharmacy*  Testing/Procedures: Your physician has recommended that you wear a Zio monitor for 14 days. This monitor is a medical device that records the heart's electrical activity. Doctors most often use these monitors to diagnose arrhythmias. Arrhythmias are problems with the speed or rhythm of the heartbeat. The monitor is a small device applied to your chest. You can wear one while you do your normal daily activities. While wearing this monitor if you have any symptoms to push the button and record what you felt. Once you have worn this monitor for the period of time provider prescribed (Usually 14 days), you will return the monitor device in the postage paid box. Once it is returned they will download the data collected and provide [place] with a report which the provider will then review and we will call you with those results. Important tips:  1. Avoid showering during the first 24 hours of wearing the monitor. 2. Avoid excessive sweating to help maximize wear time. 3. Do not submerge the device, no hot tubs, and no swimming pools. 4. Keep any lotions or oils away from the patch. 5. After 24 hours you may shower with the patch on. Take brief showers with your back facing the shower head.  6. Do not remove patch once it has been placed because that will interrupt data and decrease adhesive wear time. 7. Push the button when you have any symptoms and write down what you were feeling. 8. Once you have completed wearing your monitor, remove and place into box which has postage paid and place in your outgoing mailbox.  9. If for some reason you have misplaced your box then call our office and we can provide another box and/or mail it  off for you.   Follow-Up: At [facility], you and your health needs are our priority.  As part of our continuing mission to provide you with exceptional heart care, we have created designated Provider Care Teams.  These Care Teams include your primary Cardiologist (physician) and Advanced Practice Providers (APPs -  Physician Assistants and Nurse Practitioners) who all work together to provide you with the care you need, when you need it.  We recommend signing up for the patient portal called "MyChart".  Sign up information is provided on this After Visit Summary.  MyChart is used to connect with patients for Virtual Visits (Telemedicine).  Patients are able to view lab/test results, encounter notes, upcoming appointments, etc.  Non-urgent messages can be sent to your provider as well.   To learn more about what you can do with MyChart, go to [email or web address].    Your next appointment:   3 month(s)  The format for your next appointment:   In Person  Provider:   You may see Yvonne Kendall, MD or one of the following Advanced Practice Providers on your designated Care Team:    Nicolasa Ducking, NP  Eula Listen, PA-C  Marisue Ivan, PA-C  [place], [place]  Gillian Shields, NP

## 2020-10-10 ENCOUNTER — Encounter: Payer: Self-pay | Admitting: Internal Medicine

## 2020-10-10 ENCOUNTER — Telehealth: Payer: Self-pay | Admitting: *Deleted

## 2020-10-10 DIAGNOSIS — R002 Palpitations: Secondary | ICD-10-CM | POA: Insufficient documentation

## 2020-10-10 DIAGNOSIS — I5022 Chronic systolic (congestive) heart failure: Secondary | ICD-10-CM | POA: Insufficient documentation

## 2020-10-10 DIAGNOSIS — Z8679 Personal history of other diseases of the circulatory system: Secondary | ICD-10-CM | POA: Insufficient documentation

## 2020-10-10 DIAGNOSIS — E1169 Type 2 diabetes mellitus with other specified complication: Secondary | ICD-10-CM | POA: Insufficient documentation

## 2020-10-10 DIAGNOSIS — Z9889 Other specified postprocedural states: Secondary | ICD-10-CM

## 2020-10-10 NOTE — Telephone Encounter (Signed)
-----   Message from Yvonne Kendall, MD sent at 10/10/2020  7:08 AM EST ----- Merrily Pew,  I was reviewing Ms. Rana's chart after our visit and think that it would be prudent to repeat an echocardiogram as it has been about a year since her mitral valve repair.  Do you mind reaching out to Ms. Lopezperez to let her know this recommendation and arrange for the echo when she has completed her current event monitor?  If any questions come up, please let me know.  Thanks.  Thayer Ohm

## 2020-10-10 NOTE — Telephone Encounter (Signed)
Patient agreeable to echo and verbalized understanding. Routing to scheduling to reach back out to patient to schedule.

## 2020-10-21 NOTE — Telephone Encounter (Signed)
Echo on 10/30/20.

## 2020-10-22 DIAGNOSIS — R002 Palpitations: Secondary | ICD-10-CM

## 2020-10-27 ENCOUNTER — Other Ambulatory Visit: Payer: Self-pay | Admitting: Internal Medicine

## 2020-10-30 ENCOUNTER — Other Ambulatory Visit: Payer: Self-pay

## 2020-10-30 ENCOUNTER — Ambulatory Visit (INDEPENDENT_AMBULATORY_CARE_PROVIDER_SITE_OTHER): Payer: Commercial Managed Care - PPO

## 2020-10-30 DIAGNOSIS — Z9889 Other specified postprocedural states: Secondary | ICD-10-CM | POA: Diagnosis not present

## 2020-10-30 LAB — ECHOCARDIOGRAM COMPLETE
AR max vel: 2.05 cm2
AV Area VTI: 2 cm2
AV Area mean vel: 1.94 cm2
AV Mean grad: 3 mmHg
AV Peak grad: 5.6 mmHg
Ao pk vel: 1.18 m/s
Area-P 1/2: 3.15 cm2
MV VTI: 1.84 cm2

## 2020-10-30 MED ORDER — PERFLUTREN LIPID MICROSPHERE
1.0000 mL | INTRAVENOUS | Status: AC | PRN
  Administered 2020-10-30: 2 mL via INTRAVENOUS

## 2020-11-05 ENCOUNTER — Telehealth: Payer: Self-pay | Admitting: *Deleted

## 2020-11-05 DIAGNOSIS — I471 Supraventricular tachycardia: Secondary | ICD-10-CM

## 2020-11-05 NOTE — Telephone Encounter (Signed)
Results released to My Chart. No answer. Left detailed message with results, ok per DPR, and to call back if any questions. Referral placed and routing to scheduling to reach out.

## 2020-11-05 NOTE — Telephone Encounter (Signed)
-----   Message from Yvonne Kendall, MD sent at 11/05/2020  8:41 AM EDT ----- Please let Ms. Harton know that her monitor did not show evidence of atrial fibrillation but was notable for multiple episodes of elevated heart rates consistent with supraventricular tachycardia.  The longest episode lasted 48 minutes.  I recommend that we refer Ms. Boreman to see Dr. Lalla Brothers for further management of her SVT.

## 2020-11-11 NOTE — Telephone Encounter (Signed)
Scheduled to see DR Lalla Brothers in April.

## 2020-11-13 ENCOUNTER — Other Ambulatory Visit: Payer: Self-pay | Admitting: Family

## 2020-11-13 DIAGNOSIS — I5022 Chronic systolic (congestive) heart failure: Secondary | ICD-10-CM

## 2020-11-13 NOTE — Telephone Encounter (Signed)
Rx request sent to pharmacy.  

## 2020-11-26 ENCOUNTER — Encounter: Payer: Self-pay | Admitting: Cardiology

## 2020-11-26 ENCOUNTER — Ambulatory Visit: Payer: Commercial Managed Care - PPO | Admitting: Cardiology

## 2020-11-26 ENCOUNTER — Other Ambulatory Visit: Payer: Self-pay

## 2020-11-26 VITALS — BP 104/66 | HR 92 | Ht 67.0 in | Wt 182.0 lb

## 2020-11-26 DIAGNOSIS — Z8679 Personal history of other diseases of the circulatory system: Secondary | ICD-10-CM | POA: Diagnosis not present

## 2020-11-26 DIAGNOSIS — I471 Supraventricular tachycardia: Secondary | ICD-10-CM | POA: Diagnosis not present

## 2020-11-26 DIAGNOSIS — R002 Palpitations: Secondary | ICD-10-CM

## 2020-11-26 MED ORDER — METOPROLOL SUCCINATE ER 50 MG PO TB24
50.0000 mg | ORAL_TABLET | Freq: Every day | ORAL | 3 refills | Status: DC
Start: 2020-11-26 — End: 2021-10-30

## 2020-11-26 NOTE — Progress Notes (Signed)
Electrophysiology Office Note:    Date:  11/26/2020   ID:  Bonnie Ryan, DOB 23-Nov-1955, MRN [id number]  PCP:  Patient, No Pcp Per (Inactive)  [facility] Cardiologist:  Yvonne Kendall, MD  [facility] Electrophysiologist:  Lanier Prude, MD   Referring MD: Yvonne Kendall, MD   Chief Complaint: SVT  History of Present Illness:    Bonnie Ryan is a 65 y.o. female who presents for an evaluation of SVT at the request of Dr. Okey Dupre. Their medical history includes mitral valve prolapse with severe regurgitation post mitral valve repair in January 2021.  The postoperative course was complicated by atrial fibrillation.  Her medical history also includes hypertension, type 2 diabetes.  She last saw Dr. Okey Dupre on October 08, 2020.  At that appointment she reported palpitations occurring intermittently over the last 2 months.  Over 2 months.  She has had 6 episodes.  Each episode lasts 3 to 4 minutes and is usually occurring when she is laying flat.  At the appoint with Dr. Okey Dupre, a ZIO monitor was ordered.  Past Medical History:  Diagnosis Date  . Diabetes mellitus    Previously diet-controlled  . Endometriosis   . Family history of adverse reaction to anesthesia    mother used to have vomiting after   . Hepatic steatosis 04/15/2020  . Hypercholesteremia   . Hypertension   . Insomnia 06/03/2020  . Mitral Valve Prolapse w/ Mitral regurgitation    a. 10/2009 Echo: Mod MVP, mild MR; b. 07/2019 Echo: EF 60-65%, Gr2 DD, Mod MR; c. 07/2019 TEE: EF 60-65%. No rwma. Mod dil LA. Sev MR w/ sev bileaflet prolapse. Mild TR; d. 08/2019 s/p minimally invasive MV repair.  . Non-obstructive CAD (coronary artery disease)    a. 09/2006 Cath: LAD 30, RCA 25; b.07/2011 Cath: LM Ca2+, 30-68mLAD, RCA 30-40p, EF 55-65%, 2+ MR; c. 07/2019 Cath: LM 95m/d, LAD 36m, RI nl, LCX 61m, RCA 45p, 64m.  Marland Kitchen Post-op atrial fibrillation (HCC)    a. 08/2019 following MV repair-->Amio/warfarin initiated.  Initially  prolonged QT w/ IV amio-->converted to sinus.  . RLS (restless legs syndrome)   . S/P minimally invasive mitral valve repair 08/30/2019   a. 08/2019 s/p Complex valvuloplasty including artificial Goretex neochord placement x12 with plication of posterior leaflet and 40 mm Sorin Memo 4D ring annuloplasty via right mini thoracotomy approach.  Marland Kitchen UTI (urinary tract infection)     Past Surgical History:  Procedure Laterality Date  . ABDOMINAL HYSTERECTOMY     tah/bso  . APPENDECTOMY    . BUBBLE STUDY  08/09/2019   Procedure: BUBBLE STUDY;  Surgeon: Sande Rives, MD;  Location: [facility] ENDOSCOPY;  Service: Cardiology;;  . CHOLECYSTECTOMY    . KNEE SURGERY    . LAPAROSCOPY    . MITRAL VALVE REPAIR Right 08/30/2019   Procedure: MINIMALLY INVASIVE MITRAL VALVE REPAIR (MVR) USING MEMO 4D SIZE 40;  Surgeon: Purcell Nails, MD;  Location: [facility] OR;  Service: Open Heart Surgery;  Laterality: Right;  . RIGHT/LEFT HEART CATH AND CORONARY ANGIOGRAPHY N/A 08/09/2019   Procedure: RIGHT/LEFT HEART CATH AND CORONARY ANGIOGRAPHY;  Surgeon: Yvonne Kendall, MD;  Location: [facility] INVASIVE CV LAB;  Service: Cardiovascular;  Laterality: N/A;  . TEE WITHOUT CARDIOVERSION N/A 08/09/2019   Procedure: TRANSESOPHAGEAL ECHOCARDIOGRAM (TEE);  Surgeon: Sande Rives, MD;  Location: [facility] ENDOSCOPY;  Service: Cardiology;  Laterality: N/A;  . TEE WITHOUT CARDIOVERSION N/A 08/30/2019   Procedure: TRANSESOPHAGEAL ECHOCARDIOGRAM (TEE);  Surgeon: Purcell Nails, MD;  Location: [facility] OR;  Service: Open Heart Surgery;  Laterality: N/A;  . US ECHOCARDIOGRAPHY     EF 55-60%    Current Medications: Current Meds  Medication Sig  . aspirin EC 81 MG tablet Take 1 tablet (81 mg total) by mouth daily.  . furosemide (LASIX) 40 MG tablet TAKE 1 TABLET (40 MG TOTAL) BY MOUTH AS NEEDED FOR FLUID OR EDEMA.  . Insulin Pen Needle (PEN NEEDLES) 31G X 6 MM MISC Use to inject Victoza  . losartan (COZAAR) 25 MG tablet TAKE 1 TABLET BY MOUTH  EVERY DAY  . metFORMIN (GLUCOPHAGE) 500 MG tablet TAKE 1 TABLET (500 MG TOTAL) BY MOUTH 2 (TWO) TIMES DAILY WITH A MEAL.  . metoprolol succinate (TOPROL-XL) 50 MG 24 hr tablet Take 1 tablet (50 mg total) by mouth daily. Take with or immediately following a meal.  . ONETOUCH VERIO test strip USE AS INSTRUCTED USE TO TEST BLOOD GLUCOSE DAILY. DX E11.9  . potassium chloride SA (KLOR-CON) 20 MEQ tablet Take 20 mEq by mouth daily. Take an additional 20 mEq (40 mEq total) on days you take lasix.  Marland Kitchen rosuvastatin (CRESTOR) 40 MG tablet Take 1 tablet (40 mg total) by mouth daily.  Marland Kitchen VICTOZA 18 MG/3ML SOPN INJECT 1.8 MG UNDER THE SKIN ONCE DAILY  . zolpidem (AMBIEN CR) 6.25 MG CR tablet Take 1 tablet (6.25 mg total) by mouth at bedtime as needed for sleep.  . [DISCONTINUED] metoprolol succinate (TOPROL-XL) 25 MG 24 hr tablet Take 1 tablet (25 mg total) by mouth daily.     Allergies:   Penicillins   Social History   Socioeconomic History  . Marital status: Divorced    Spouse name: Not on file  . Number of children: Not on file  . Years of education: Not on file  . Highest education level: Not on file  Occupational History  . Not on file  Tobacco Use  . Smoking status: Former Smoker    Packs/day: 0.25    Years: 20.00    Pack years: 5.00    Types: Cigarettes    Quit date: 07/24/2015    Years since quitting: 5.3  . Smokeless tobacco: Never Used  Vaping Use  . Vaping Use: Not on file  Substance and Sexual Activity  . Alcohol use: Not Currently    Alcohol/week: 5.0 standard drinks    Types: 5 Standard drinks or equivalent per week  . Drug use: No  . Sexual activity: Not Currently    Birth control/protection: Surgical    Comment: Hysterectomy  Other Topics Concern  . Not on file  Social History Narrative  . Not on file   Social Determinants of Health   Financial Resource Strain: Not on file  Food Insecurity: Not on file  Transportation Needs: Not on file  Physical Activity: Not on  file  Stress: Not on file  Social Connections: Not on file     Family History: The patient's family history includes Breast cancer (age of onset: 58) in her mother; Diabetes in her mother; Hypertension in her father. There is no history of Ovarian cancer, Colon cancer, or Heart disease.  ROS:   Please see the history of present illness.    All other systems reviewed and are negative.  EKGs/Labs/Other Studies Reviewed:    The following studies were reviewed today:  November 03, 2020 ZIO monitor personally reviewed 1.8% PVC burden 4 beat run of NSVT Episodes labeled "SVT" had relatively slow heart rates of 100 220 bpm  and appear to have a P wave preceding each QRS.  This suggests an ectopic atrial tachycardia versus sinus tachycardia.    October 30, 2020 echo personally reviewed Left ventricular function normal, 55% Right ventricular function normal Mildly dilated left atrium No MR Evidence of repaired mitral valve No other valvular abnormalities  EKG:  The ekg ordered today demonstrates sinus rhythm  Recent Labs: 04/09/2020: ALT 22 05/02/2020: BUN 9; Creatinine, Ser 0.81; Potassium 3.2; Sodium 141  Recent Lipid Panel    Component Value Date/Time   CHOL 121 04/09/2020 1528   TRIG 252.0 (H) 04/09/2020 1528   HDL 43.20 04/09/2020 1528   CHOLHDL 3 04/09/2020 1528   VLDL 50.4 (H) 04/09/2020 1528   LDLCALC 91 11/30/2019 1653   LDLDIRECT 47.0 04/09/2020 1528    Physical Exam:    VS:  BP 104/66 (BP Location: Left Arm, Patient Position: Sitting, Cuff Size: Normal)   Pulse 92   Ht 5\' 7"  (1.702 m)   Wt 182 lb (82.6 kg)   LMP  (LMP Unknown)   SpO2 96%   BMI 28.51 kg/m     Wt Readings from Last 3 Encounters:  11/26/20 182 lb (82.6 kg)  10/08/20 184 lb 6 oz (83.6 kg)  05/02/20 172 lb 9.6 oz (78.3 kg)     GEN:  Well nourished, well developed in no acute distress HEENT: Normal NECK: No JVD; No carotid bruits LYMPHATICS: No lymphadenopathy CARDIAC: RRR, no murmurs, rubs,  gallops RESPIRATORY:  Clear to auscultation without rales, wheezing or rhonchi  ABDOMEN: Soft, non-tender, non-distended MUSCULOSKELETAL:  No edema; No deformity  SKIN: Warm and dry NEUROLOGIC:  Alert and oriented x 3 PSYCHIATRIC:  Normal affect   ASSESSMENT:    1. SVT (supraventricular tachycardia) (HCC)   2. Palpitations   3. History of atrial fibrillation    PLAN:    In order of problems listed above:  1. Palpitations ZIO monitor shows several episodes of elevated heart rates that appear to be in atrial tachycardia.  Differential diagnosis would also include atrial flutter although I do not think this is likely based on the morphology seen on the ZIO monitor.  For now, would plan to increase her dose of metoprolol to 50 mg by mouth once daily and see how this impacts her palpitations.  I will plan to see her in follow-up in 3 months.  Medication Adjustments/Labs and Tests Ordered: Current medicines are reviewed at length with the patient today.  Concerns regarding medicines are outlined above.  Orders Placed This Encounter  Procedures  . EKG 12-Lead   Meds ordered this encounter  Medications  . metoprolol succinate (TOPROL-XL) 50 MG 24 hr tablet    Sig: Take 1 tablet (50 mg total) by mouth daily. Take with or immediately following a meal.    Dispense:  90 tablet    Refill:  3     Signed, 07/02/20, MD, [facility]  11/26/2020 10:12 PM    Electrophysiology [facility]

## 2020-11-26 NOTE — Patient Instructions (Addendum)
Medication Instructions:  Your physician has recommended you make the following change in your medication:   ** Begin Metoprolol Succinate50mg  - 1 tablet by mouth daily   *If you need a refill on your cardiac medications before your next appointment, please call your pharmacy*   Lab Work: None ordered.  If you have labs (blood work) drawn today and your tests are completely normal, you will receive your results only by: Marland Kitchen MyChart Message (if you have MyChart) OR . A paper copy in the mail If you have any lab test that is abnormal or we need to change your treatment, we will call you to review the results.   Testing/Procedures: None ordered.    Follow-Up: At [facility], you and your health needs are our priority.  As part of our continuing mission to provide you with exceptional heart care, we have created designated Provider Care Teams.  These Care Teams include your primary Cardiologist (physician) and Advanced Practice Providers (APPs -  Physician Assistants and Nurse Practitioners) who all work together to provide you with the care you need, when you need it.  We recommend signing up for the patient portal called "MyChart".  Sign up information is provided on this After Visit Summary.  MyChart is used to connect with patients for Virtual Visits (Telemedicine).  Patients are able to view lab/test results, encounter notes, upcoming appointments, etc.  Non-urgent messages can be sent to your provider as well.   To learn more about what you can do with MyChart, go to [email or web address].    Your next appointment:   3 month(s)  The format for your next appointment:   In Person  Provider:   Steffanie Dunn, MD

## 2020-11-28 ENCOUNTER — Other Ambulatory Visit: Payer: Self-pay | Admitting: Internal Medicine

## 2021-01-07 ENCOUNTER — Ambulatory Visit: Payer: Commercial Managed Care - PPO | Admitting: Internal Medicine

## 2021-01-26 ENCOUNTER — Other Ambulatory Visit: Payer: Self-pay | Admitting: Internal Medicine

## 2021-01-27 ENCOUNTER — Other Ambulatory Visit: Payer: Self-pay | Admitting: Internal Medicine

## 2021-01-27 NOTE — Telephone Encounter (Signed)
Rx request sent to pharmacy.  

## 2021-03-01 ENCOUNTER — Other Ambulatory Visit: Payer: Self-pay | Admitting: Internal Medicine

## 2021-03-02 NOTE — Telephone Encounter (Signed)
Patient declined to schedule at this time.  She is seeing Lalla Brothers 7/13 and wants to ask if both are needed.

## 2021-03-02 NOTE — Telephone Encounter (Signed)
Please schedule overdue F/U appointment with Dr. Okey Dupre. Patient cancelled last scheduled office visit. Thank you!

## 2021-03-04 ENCOUNTER — Encounter: Payer: Self-pay | Admitting: Cardiology

## 2021-03-04 ENCOUNTER — Ambulatory Visit: Payer: Commercial Managed Care - PPO | Admitting: Cardiology

## 2021-03-04 ENCOUNTER — Other Ambulatory Visit: Payer: Self-pay

## 2021-03-04 VITALS — BP 110/80 | HR 88 | Ht 67.0 in | Wt 181.0 lb

## 2021-03-04 DIAGNOSIS — R002 Palpitations: Secondary | ICD-10-CM | POA: Diagnosis not present

## 2021-03-04 DIAGNOSIS — I471 Supraventricular tachycardia: Secondary | ICD-10-CM | POA: Diagnosis not present

## 2021-03-04 DIAGNOSIS — I5022 Chronic systolic (congestive) heart failure: Secondary | ICD-10-CM

## 2021-03-04 DIAGNOSIS — Z9889 Other specified postprocedural states: Secondary | ICD-10-CM | POA: Diagnosis not present

## 2021-03-04 NOTE — Patient Instructions (Addendum)

## 2021-03-04 NOTE — Progress Notes (Signed)
Electrophysiology Office Follow up Visit Note:    Date:  03/04/2021   ID:  Bonnie Ryan, DOB 10/03/1955, MRN [id number]  PCP:  Patient, No Pcp Per (Inactive)  [facility] Cardiologist:  Yvonne Kendall, MD  [facility] Electrophysiologist:  Lanier Prude, MD    Interval History:    Bonnie Ryan is a 65 y.o. female who presents for a follow up visit. They were last seen in clinic 11/26/2020 for palpitations. She has a hx of MV repair in 08/2019.   At that appointment, her metoprolol was increased to 50mg  PO daily and f/u was scheduled for today to assess response to therapy.  Today she tells me she has not noticed a huge change in her symptoms.  She still has intermittent dizziness when she stands up from a seated position.  Occasional palpitations that come on at rest and with exertion.  She also has some muscle spasm type pain in her neck and right shoulder.    Past Medical History:  Diagnosis Date   Diabetes mellitus    Previously diet-controlled   Endometriosis    Family history of adverse reaction to anesthesia    mother used to have vomiting after    Hepatic steatosis 04/15/2020   Hypercholesteremia    Hypertension    Insomnia 06/03/2020   Mitral Valve Prolapse w/ Mitral regurgitation    a. 10/2009 Echo: Mod MVP, mild MR; b. 07/2019 Echo: EF 60-65%, Gr2 DD, Mod MR; c. 07/2019 TEE: EF 60-65%. No rwma. Mod dil LA. Sev MR w/ sev bileaflet prolapse. Mild TR; d. 08/2019 s/p minimally invasive MV repair.   Non-obstructive CAD (coronary artery disease)    a. 09/2006 Cath: LAD 30, RCA 25; b.07/2011 Cath: LM Ca2+, 30-87mLAD, RCA 30-40p, EF 55-65%, 2+ MR; c. 07/2019 Cath: LM 1m/d, LAD 61m, RI nl, LCX 8m, RCA 45p, 2m.   Post-op atrial fibrillation (HCC)    a. 08/2019 following MV repair-->Amio/warfarin initiated.  Initially prolonged QT w/ IV amio-->converted to sinus.   RLS (restless legs syndrome)    S/P minimally invasive mitral valve repair 08/30/2019   a. 08/2019 s/p  Complex valvuloplasty including artificial Goretex neochord placement x12 with plication of posterior leaflet and 40 mm Sorin Memo 4D ring annuloplasty via right mini thoracotomy approach.   UTI (urinary tract infection)     Past Surgical History:  Procedure Laterality Date   ABDOMINAL HYSTERECTOMY     tah/bso   APPENDECTOMY     BUBBLE STUDY  08/09/2019   Procedure: BUBBLE STUDY;  Surgeon: 08/11/2019, MD;  Location: [facility] ENDOSCOPY;  Service: Cardiology;;   CHOLECYSTECTOMY     KNEE SURGERY     LAPAROSCOPY     MITRAL VALVE REPAIR Right 08/30/2019   Procedure: MINIMALLY INVASIVE MITRAL VALVE REPAIR (MVR) USING MEMO 4D SIZE 40;  Surgeon: 10/28/2019, MD;  Location: [facility] OR;  Service: Open Heart Surgery;  Laterality: Right;   RIGHT/LEFT HEART CATH AND CORONARY ANGIOGRAPHY N/A 08/09/2019   Procedure: RIGHT/LEFT HEART CATH AND CORONARY ANGIOGRAPHY;  Surgeon: 08/11/2019, MD;  Location: [facility] INVASIVE CV LAB;  Service: Cardiovascular;  Laterality: N/A;   TEE WITHOUT CARDIOVERSION N/A 08/09/2019   Procedure: TRANSESOPHAGEAL ECHOCARDIOGRAM (TEE);  Surgeon: 08/11/2019, MD;  Location: [facility] ENDOSCOPY;  Service: Cardiology;  Laterality: N/A;   TEE WITHOUT CARDIOVERSION N/A 08/30/2019   Procedure: TRANSESOPHAGEAL ECHOCARDIOGRAM (TEE);  Surgeon: 10/28/2019, MD;  Location: [facility] OR;  Service: Open Heart Surgery;  Laterality: N/A;   [facility] ECHOCARDIOGRAPHY  EF 55-60%    Current Medications: Current Meds  Medication Sig   furosemide (LASIX) 40 MG tablet TAKE 1 TABLET (40 MG TOTAL) BY MOUTH AS NEEDED FOR FLUID OR EDEMA.   Insulin Pen Needle (PEN NEEDLES) 31G X 6 MM MISC Use to inject Victoza   KLOR-CON M20 20 MEQ tablet TAKE 1 TABLET BY MOUTH EVERY DAY   losartan (COZAAR) 25 MG tablet TAKE 1 TABLET BY MOUTH EVERY DAY   metFORMIN (GLUCOPHAGE) 500 MG tablet TAKE 1 TABLET (500 MG TOTAL) BY MOUTH 2 (TWO) TIMES DAILY WITH A MEAL.   metoprolol succinate (TOPROL-XL) 50 MG 24 hr tablet  Take 1 tablet (50 mg total) by mouth daily. Take with or immediately following a meal.   ONETOUCH VERIO test strip USE AS INSTRUCTED USE TO TEST BLOOD GLUCOSE DAILY. DX E11.9   rosuvastatin (CRESTOR) 40 MG tablet Take 1 tablet (40 mg total) by mouth daily.   VICTOZA 18 MG/3ML SOPN INJECT 1.8 MG UNDER THE SKIN ONCE DAILY   zolpidem (AMBIEN CR) 6.25 MG CR tablet Take 1 tablet (6.25 mg total) by mouth at bedtime as needed for sleep.     Allergies:   Penicillins   Social History   Socioeconomic History   Marital status: Divorced    Spouse name: Not on file   Number of children: Not on file   Years of education: Not on file   Highest education level: Not on file  Occupational History   Not on file  Tobacco Use   Smoking status: Former    Packs/day: 0.25    Years: 20.00    Pack years: 5.00    Types: Cigarettes    Quit date: 07/24/2015    Years since quitting: 5.6   Smokeless tobacco: Never  Vaping Use   Vaping Use: Not on file  Substance and Sexual Activity   Alcohol use: Not Currently    Alcohol/week: 5.0 standard drinks    Types: 5 Standard drinks or equivalent per week   Drug use: No   Sexual activity: Not Currently    Birth control/protection: Surgical    Comment: Hysterectomy  Other Topics Concern   Not on file  Social History Narrative   Not on file   Social Determinants of Health   Financial Resource Strain: Not on file  Food Insecurity: Not on file  Transportation Needs: Not on file  Physical Activity: Not on file  Stress: Not on file  Social Connections: Not on file     Family History: The patient's family history includes Breast cancer (age of onset: 26) in her mother; Diabetes in her mother; Hypertension in her father. There is no history of Ovarian cancer, Colon cancer, or Heart disease.  ROS:   Please see the history of present illness.    All other systems reviewed and are negative.  EKGs/Labs/Other Studies Reviewed:    The following studies were  reviewed today:   EKG:  The ekg ordered today demonstrates sinus rhythm with a first-degree AV delay and PVC  Recent Labs: 04/09/2020: ALT 22 05/02/2020: BUN 9; Creatinine, Ser 0.81; Potassium 3.2; Sodium 141  Recent Lipid Panel    Component Value Date/Time   CHOL 121 04/09/2020 1528   TRIG 252.0 (H) 04/09/2020 1528   HDL 43.20 04/09/2020 1528   CHOLHDL 3 04/09/2020 1528   VLDL 50.4 (H) 04/09/2020 1528   LDLCALC 91 11/30/2019 1653   LDLDIRECT 47.0 04/09/2020 1528    Physical Exam:    VS:  BP  110/80 (BP Location: Left Arm, Patient Position: Sitting, Cuff Size: Normal)   Pulse 88   Ht 5\' 7"  (1.702 m)   Wt 181 lb (82.1 kg)   LMP  (LMP Unknown)   SpO2 97%   BMI 28.35 kg/m     Wt Readings from Last 3 Encounters:  03/04/21 181 lb (82.1 kg)  11/26/20 182 lb (82.6 kg)  10/08/20 184 lb 6 oz (83.6 kg)     GEN:  Well nourished, well developed in no acute distress HEENT: Normal NECK: No JVD; No carotid bruits LYMPHATICS: No lymphadenopathy CARDIAC: RRR, no murmurs, rubs, gallops RESPIRATORY:  Clear to auscultation without rales, wheezing or rhonchi  ABDOMEN: Soft, non-tender, non-distended MUSCULOSKELETAL:  No edema; No deformity  SKIN: Warm and dry NEUROLOGIC:  Alert and oriented x 3 PSYCHIATRIC:  Normal affect   ASSESSMENT:    1. SVT (supraventricular tachycardia) (HCC)   2. Palpitations   3. S/P mitral valve repair   4. Chronic HFrEF (heart failure with reduced ejection fraction) (HCC)    PLAN:    In order of problems listed above:   1. SVT (supraventricular tachycardia) (HCC) Review of rhythm strips appear to be in atrial tachycardia.  No evidence of atrial flutter or fibrillation.  I suspect some of her lightheadedness with changes in position are related to her beta-blocker use.  I have asked her to wean the metoprolol down to see if she has any improvement in her symptoms.  She can cut it to 25 mg by mouth daily for 3 to 4 days and then decrease it further to  12.5 mg by mouth daily and then ultimately discontinue it to see if this helps her lightheadedness.  If recurrence of SVT while weaning the metoprolol, she should restart it.    2. Palpitations See #1  3. S/P mitral valve repair Euvolemic.  4. Chronic HFrEF (heart failure with reduced ejection fraction) (HCC) Recovered ejection fraction.  March 2022 EF 55%.  Continue home regimen.  Follow-up as needed   Medication Adjustments/Labs and Tests Ordered: Current medicines are reviewed at length with the patient today.  Concerns regarding medicines are outlined above.  Orders Placed This Encounter  Procedures   EKG 12-Lead   No orders of the defined types were placed in this encounter.    Signed, April 2022, MD, [facility], [facility] 03/04/2021 2:16 PM    Electrophysiology [facility]

## 2021-03-26 ENCOUNTER — Other Ambulatory Visit: Payer: Self-pay | Admitting: Internal Medicine

## 2021-03-26 ENCOUNTER — Other Ambulatory Visit: Payer: Self-pay

## 2021-03-26 MED ORDER — LOSARTAN POTASSIUM 25 MG PO TABS: 25.0000 mg | ORAL_TABLET | Freq: Every day | ORAL | 0 refills | Status: DC

## 2021-03-26 NOTE — Telephone Encounter (Signed)
Please reschedule F/U appointment. Patient cancelled last office visit due to family emergency. Thank you!

## 2021-04-27 ENCOUNTER — Other Ambulatory Visit: Payer: Self-pay | Admitting: Internal Medicine

## 2021-04-27 DIAGNOSIS — I5022 Chronic systolic (congestive) heart failure: Secondary | ICD-10-CM

## 2021-05-01 ENCOUNTER — Ambulatory Visit: Payer: Commercial Managed Care - PPO | Admitting: Internal Medicine

## 2021-05-01 ENCOUNTER — Other Ambulatory Visit: Payer: Self-pay

## 2021-05-01 ENCOUNTER — Encounter: Payer: Self-pay | Admitting: Internal Medicine

## 2021-05-01 ENCOUNTER — Other Ambulatory Visit
Admission: RE | Admit: 2021-05-01 | Discharge: 2021-05-01 | Disposition: A | Discharge status: Home / Self Care | Payer: Commercial Managed Care - PPO | Source: Ambulatory Visit | Attending: Internal Medicine | Admitting: Internal Medicine

## 2021-05-01 VITALS — BP 90/70 | HR 90 | Ht 67.0 in | Wt 181.0 lb

## 2021-05-01 DIAGNOSIS — E118 Type 2 diabetes mellitus with unspecified complications: Secondary | ICD-10-CM

## 2021-05-01 DIAGNOSIS — E785 Hyperlipidemia, unspecified: Secondary | ICD-10-CM

## 2021-05-01 DIAGNOSIS — I10 Essential (primary) hypertension: Secondary | ICD-10-CM | POA: Insufficient documentation

## 2021-05-01 DIAGNOSIS — I4819 Other persistent atrial fibrillation: Secondary | ICD-10-CM

## 2021-05-01 DIAGNOSIS — Z79899 Other long term (current) drug therapy: Secondary | ICD-10-CM

## 2021-05-01 DIAGNOSIS — I2510 Atherosclerotic heart disease of native coronary artery without angina pectoris: Secondary | ICD-10-CM

## 2021-05-01 DIAGNOSIS — E1169 Type 2 diabetes mellitus with other specified complication: Secondary | ICD-10-CM

## 2021-05-01 DIAGNOSIS — I25119 Atherosclerotic heart disease of native coronary artery with unspecified angina pectoris: Secondary | ICD-10-CM | POA: Diagnosis not present

## 2021-05-01 DIAGNOSIS — I5022 Chronic systolic (congestive) heart failure: Secondary | ICD-10-CM

## 2021-05-01 DIAGNOSIS — I471 Supraventricular tachycardia: Secondary | ICD-10-CM | POA: Diagnosis not present

## 2021-05-01 LAB — LIPID PANEL
Cholesterol: 135 mg/dL (ref 0–200)
HDL: 42 mg/dL (ref >40)
LDL Cholesterol: 36 mg/dL (ref 0–99)
Total CHOL/HDL Ratio: 3.2 RATIO
Triglycerides: 284 mg/dL — ABNORMAL HIGH (ref <150)
VLDL: 57 mg/dL — ABNORMAL HIGH (ref 0–40)

## 2021-05-01 LAB — COMPREHENSIVE METABOLIC PANEL
ALT: 33 U/L (ref 0–44)
AST: 45 U/L — ABNORMAL HIGH (ref 15–41)
Albumin: 4.3 g/dL (ref 3.5–5.0)
Alkaline Phosphatase: 65 U/L (ref 38–126)
Anion gap: 9 (ref 5–15)
BUN: 19 mg/dL (ref 8–23)
CO2: 28 mmol/L (ref 22–32)
Calcium: 9.3 mg/dL (ref 8.9–10.3)
Chloride: 100 mmol/L (ref 98–111)
Creatinine, Ser: 1.16 mg/dL — ABNORMAL HIGH (ref 0.44–1.00)
GFR, Estimated: 53 mL/min — ABNORMAL LOW (ref >60)
Glucose, Bld: 156 mg/dL — ABNORMAL HIGH (ref 70–99)
Potassium: 3.2 mmol/L — ABNORMAL LOW (ref 3.5–5.1)
Sodium: 137 mmol/L (ref 135–145)
Total Bilirubin: 0.7 mg/dL (ref 0.3–1.2)
Total Protein: 7 g/dL (ref 6.5–8.1)

## 2021-05-01 LAB — CBC
HCT: 38.4 % (ref 36.0–46.0)
Hemoglobin: 13.4 g/dL (ref 12.0–15.0)
MCH: 32 pg (ref 26.0–34.0)
MCHC: 34.9 g/dL (ref 30.0–36.0)
MCV: 91.6 fL (ref 80.0–100.0)
Platelets: 179 10*3/uL (ref 150–400)
RBC: 4.19 MIL/uL (ref 3.87–5.11)
RDW: 13.5 % (ref 11.5–15.5)
WBC: 6.8 10*3/uL (ref 4.0–10.5)
nRBC: 0 % (ref 0.0–0.2)

## 2021-05-01 LAB — TSH: TSH: 1.17 u[IU]/mL (ref 0.350–4.500)

## 2021-05-01 LAB — HEMOGLOBIN A1C
Hgb A1c MFr Bld: 7 % — ABNORMAL HIGH (ref 4.8–5.6)
Mean Plasma Glucose: 154.2 mg/dL

## 2021-05-01 MED ORDER — METFORMIN HCL 500 MG PO TABS: 500.0000 mg | ORAL_TABLET | Freq: Two times a day (BID) | ORAL | 2 refills | Status: DC

## 2021-05-01 NOTE — Patient Instructions (Signed)
Medication Instructions:   Your physician recommends that you continue on your current medications as directed. Please refer to the Current Medication list given to you today.  We have sent in refills for your Metformin.  *If you need a refill on your cardiac medications before your next appointment, please call your pharmacy*   Lab Work:  Your physician recommends that you have lab work TODAY [facility]: CBC, CMET, TSH, Lipid panel, Hgb A1c  -  Please go to the [facility]. You will check in at the front desk to the right as you walk into the atrium. Valet Parking is offered if needed. - No appointment needed. You may go any day between 7 am and 6 pm.   If you have labs (blood work) drawn today and your tests are completely normal, you will receive your results only by: MyChart Message (if you have MyChart) OR A paper copy in the mail If you have any lab test that is abnormal or we need to change your treatment, we will call you to review the results.   Testing/Procedures:  None ordered   Follow-Up: At [facility], you and your health needs are our priority.  As part of our continuing mission to provide you with exceptional heart care, we have created designated Provider Care Teams.  These Care Teams include your primary Cardiologist (physician) and Advanced Practice Providers (APPs -  Physician Assistants and Nurse Practitioners) who all work together to provide you with the care you need, when you need it.  We recommend signing up for the patient portal called "MyChart".  Sign up information is provided on this After Visit Summary.  MyChart is used to connect with patients for Virtual Visits (Telemedicine).  Patients are able to view lab/test results, encounter notes, upcoming appointments, etc.  Non-urgent messages can be sent to your provider as well.   To learn more about what you can do with MyChart, go to [email or web address].    Your next appointment:    6 month(s)  The format for your next appointment:   In Person  Provider:   You may see Yvonne Kendall, MD or one of the following Advanced Practice Providers on your designated Care Team:   Nicolasa Ducking, NP Eula Listen, PA-C Marisue Ivan, PA-C [place], [place]

## 2021-05-01 NOTE — Progress Notes (Signed)
Follow-up Outpatient Visit Date: 05/01/2021  Primary Care Provider: Patient, No Pcp Per (Inactive) No address on file  Chief Complaint: Fatigue and lightheadedness  HPI:  Bonnie Ryan is a 65 y.o. female with history of mitral valve prolapse with severe regurgitation status post mitral valve repair in 08/2019 complicated by postoperative atrial fibrillation, SVT, HFrEF with recovered ejection fraction due to nonischemic cardiomyopathy, nonobstructive coronary artery disease, hypertension, and type 2 diabetes mellitus, who presents for follow-up of mitral regurgitation.  I last saw her in February, which time she complained of palpitations.  Subsequent event monitor showed multiple episodes of SVT.  She was referred to Dr. Lalla Brothers, who has been titrating her metoprolol.  Echocardiogram in March showed normal LVEF with grade 2 diastolic dysfunction and appropriate function of mitral valve repair.  Today, Bonnie Ryan reports that she feels about the same as at prior visits.  Her energy level is still not as good as she would like it to be though she is learning to live with this and has excepted that she will not feel as well as she did 20 years ago.  She continues to have episodic palpitations with associated dizziness.  This is causing her to limit her activities, including avoiding the gym, as she is fearful that she may fall or pass out when other people are around.  Events often happen on a daily basis, though most are brief and do not cause significant dizziness.  She reports a few episodes of sporadic "angina" that she describes as a sharp pain in the center of her chest that usually lasts a few minutes.  It is not related to exertion but sometimes corresponds to her palpitations and lightheadedness.  She has stable 1 pillow orthopnea and is using her as needed furosemide on a daily basis.  She is currently without a PCP, as several providers at her prior practice have left and she wishes to be seen  somewhere else.  --------------------------------------------------------------------------------------------------  Past Medical History:  Diagnosis Date   Diabetes mellitus    Previously diet-controlled   Endometriosis    Family history of adverse reaction to anesthesia    mother used to have vomiting after    Hepatic steatosis 04/15/2020   Hypercholesteremia    Hypertension    Insomnia 06/03/2020   Mitral Valve Prolapse w/ Mitral regurgitation    a. 10/2009 Echo: Mod MVP, mild MR; b. 07/2019 Echo: EF 60-65%, Gr2 DD, Mod MR; c. 07/2019 TEE: EF 60-65%. No rwma. Mod dil LA. Sev MR w/ sev bileaflet prolapse. Mild TR; d. 08/2019 s/p minimally invasive MV repair.   Non-obstructive CAD (coronary artery disease)    a. 09/2006 Cath: LAD 30, RCA 25; b.07/2011 Cath: LM Ca2+, 30-8mLAD, RCA 30-40p, EF 55-65%, 2+ MR; c. 07/2019 Cath: LM 70m/d, LAD 47m, RI nl, LCX 24m, RCA 45p, 26m.   Post-op atrial fibrillation (HCC)    a. 08/2019 following MV repair-->Amio/warfarin initiated.  Initially prolonged QT w/ IV amio-->converted to sinus.   RLS (restless legs syndrome)    S/P minimally invasive mitral valve repair 08/30/2019   a. 08/2019 s/p Complex valvuloplasty including artificial Goretex neochord placement x12 with plication of posterior leaflet and 40 mm Sorin Memo 4D ring annuloplasty via right mini thoracotomy approach.   UTI (urinary tract infection)    Past Surgical History:  Procedure Laterality Date   ABDOMINAL HYSTERECTOMY     tah/bso   APPENDECTOMY     BUBBLE STUDY  08/09/2019   Procedure: BUBBLE STUDY;  Surgeon: Sande Rives, MD;  Location: [facility] ENDOSCOPY;  Service: Cardiology;;   CHOLECYSTECTOMY     KNEE SURGERY     LAPAROSCOPY     MITRAL VALVE REPAIR Right 08/30/2019   Procedure: MINIMALLY INVASIVE MITRAL VALVE REPAIR (MVR) USING MEMO 4D SIZE 40;  Surgeon: Purcell Nails, MD;  Location: [facility] OR;  Service: Open Heart Surgery;  Laterality: Right;   RIGHT/LEFT HEART CATH AND  CORONARY ANGIOGRAPHY N/A 08/09/2019   Procedure: RIGHT/LEFT HEART CATH AND CORONARY ANGIOGRAPHY;  Surgeon: Yvonne Kendall, MD;  Location: [facility] INVASIVE CV LAB;  Service: Cardiovascular;  Laterality: N/A;   TEE WITHOUT CARDIOVERSION N/A 08/09/2019   Procedure: TRANSESOPHAGEAL ECHOCARDIOGRAM (TEE);  Surgeon: Sande Rives, MD;  Location[facility] ENDOSCOPY;  Service: Cardiology;  Laterality: N/A;   TEE WITHOUT CARDIOVERSION N/A 08/30/2019   Procedure: TRANSESOPHAGEAL ECHOCARDIOGRAM (TEE);  Surgeon: Purcell Nails, MD;  Location: [facility] OR;  Service: Open Heart Surgery;  Laterality: N/A;   US ECHOCARDIOGRAPHY     EF 55-60%     Current Meds  Medication Sig   aspirin EC 81 MG tablet Take 1 tablet (81 mg total) by mouth daily.   furosemide (LASIX) 40 MG tablet TAKE 1 TABLET (40 MG TOTAL) BY MOUTH AS NEEDED FOR FLUID OR EDEMA.   Insulin Pen Needle (PEN NEEDLES) 31G X 6 MM MISC Use to inject Victoza   KLOR-CON M20 20 MEQ tablet TAKE 1 TABLET BY MOUTH EVERY DAY   losartan (COZAAR) 25 MG tablet TAKE 1 TABLET BY MOUTH EVERY DAY   metFORMIN (GLUCOPHAGE) 500 MG tablet TAKE 1 TABLET (500 MG TOTAL) BY MOUTH 2 (TWO) TIMES DAILY WITH A MEAL.   metoprolol succinate (TOPROL-XL) 50 MG 24 hr tablet Take 1 tablet (50 mg total) by mouth daily. Take with or immediately following a meal.   ONETOUCH VERIO test strip USE AS INSTRUCTED USE TO TEST BLOOD GLUCOSE DAILY. DX E11.9   rosuvastatin (CRESTOR) 40 MG tablet Take 1 tablet (40 mg total) by mouth daily.   VICTOZA 18 MG/3ML SOPN INJECT 1.8 MG UNDER THE SKIN ONCE DAILY    Allergies: Penicillins  Social History   Tobacco Use   Smoking status: Former    Packs/day: 0.25    Years: 20.00    Pack years: 5.00    Types: Cigarettes    Quit date: 07/24/2015    Years since quitting: 5.7   Smokeless tobacco: Never  Substance Use Topics   Alcohol use: Yes    Alcohol/week: 5.0 standard drinks    Types: 5 Standard drinks or equivalent per week    Comment:  occassionally   Drug use: No    Family History  Problem Relation Age of Onset   Diabetes Mother    Breast cancer Mother 52   Hypertension Father    Ovarian cancer Neg Hx    Colon cancer Neg Hx    Heart disease Neg Hx     Review of Systems: A 12-system review of systems was performed and was negative except as noted in the HPI.  --------------------------------------------------------------------------------------------------  Physical Exam: BP 90/70 (BP Location: Left Arm, Patient Position: Sitting, Cuff Size: Large)   Pulse 90   Ht 5\' 7"  (1.702 m)   Wt 181 lb (82.1 kg)   LMP  (LMP Unknown)   SpO2 96%   BMI 28.35 kg/m   General:  NAD. Neck: No JVD or HJR. Lungs: Clear to auscultation bilaterally without wheezes or crackles. Heart: Regular rate and rhythm without murmurs, rubs,  or gallops. Abdomen: Soft, nontender, nondistended. Extremities: No lower extremity edema.  EKG: Normal sinus rhythm with first-degree AV block (PR interval 210 ms) and nonspecific ST/T changes.  Compared with prior tracing from 03/04/2021, nonspecific ST/T changes are more pronounced in the lateral precordial leads.  Lab Results  Component Value Date   WBC 7.2 10/19/2019   HGB 13.5 10/19/2019   HCT 40.7 10/19/2019   MCV 86 10/19/2019   PLT 226 10/19/2019    Lab Results  Component Value Date   NA 141 05/02/2020   K 3.2 (L) 05/02/2020   CL 98 05/02/2020   CO2 28 05/02/2020   BUN 9 05/02/2020   CREATININE 0.81 05/02/2020   GLUCOSE 151 (H) 05/02/2020   ALT 22 04/09/2020    Lab Results  Component Value Date   CHOL 121 04/09/2020   HDL 43.20 04/09/2020   LDLCALC 91 11/30/2019   LDLDIRECT 47.0 04/09/2020   TRIG 252.0 (H) 04/09/2020   CHOLHDL 3 04/09/2020    --------------------------------------------------------------------------------------------------  ASSESSMENT AND PLAN: PSVT: Ms. Baillie continues to have daily palpitations, some of which cause her to be quite lightheaded  and are intermittently associated with chest discomfort.  She has been working with Dr. Lalla Brothers with careful titration of her metoprolol.  Overall, she does not notice much improvement.  I will reach out to Dr. Lalla Brothers to see if any other options, including an antiarrhythmic, would be available to help improve her lifestyle limiting PSVT.  I will check a CMP and TSH today.  Chronic HFrEF due to valvular heart disease with normalization of LVEF: Ms. Mcquaid appears euvolemic on exam but still complains of NYHA class II-III heart failure symptoms.  I think she would benefit from regular activity to help improve her stamina, though she is reluctant to do this out of fear of becoming dizzy or passing out.  We will continue her current medications.  I will reach out to Dr. Lalla Brothers, as above, to see if there are any other options for management of her PSVT.  Nonobstructive coronary artery disease and chest pain: Is faring notes occasional chest pain that is not exertional.  It seems to go along with her palpitations.  Catheterization prior to her mitral valve repair showed moderate CAD.  We discussed noninvasive ischemia testing but have agreed to defer this given stable symptoms.  Continue secondary prevention with metoprolol, rosuvastatin, and low-dose aspirin.  I will check a CBC and CMP today.  Hyperlipidemia associated with type 2 diabetes mellitus: Continue rosuvastatin 40 mg daily for target LDL less than 70.  I will check a CMP, lipid panel, and hemoglobin A1c today.  We will refill Ms. Kennard's metformin while she establishes with a new PCP.  Follow-up: Return to clinic in 6 months.  Yvonne Kendall, MD 05/01/2021 4:27 PM

## 2021-05-02 ENCOUNTER — Encounter: Payer: Self-pay | Admitting: Internal Medicine

## 2021-05-02 DIAGNOSIS — I471 Supraventricular tachycardia: Secondary | ICD-10-CM | POA: Insufficient documentation

## 2021-05-04 ENCOUNTER — Telehealth: Payer: Self-pay | Admitting: Internal Medicine

## 2021-05-04 DIAGNOSIS — E876 Hypokalemia: Secondary | ICD-10-CM

## 2021-05-04 DIAGNOSIS — Z79899 Other long term (current) drug therapy: Secondary | ICD-10-CM

## 2021-05-04 DIAGNOSIS — I5022 Chronic systolic (congestive) heart failure: Secondary | ICD-10-CM

## 2021-05-04 MED ORDER — FUROSEMIDE 40 MG PO TABS: ORAL_TABLET | ORAL | 1 refills | Status: AC

## 2021-05-04 NOTE — Telephone Encounter (Signed)
Bonnie Kendall, MD  05/04/2021  7:17 AM EDT     Blood counts and thyroid function are normal.  Triglycerides are mildly elevated, similar to a year ago.  Kidney function is a little worse compared with a year ago.  Potassium is also low.  I recommend that we decrease furosemide to 20 mg daily as needed for edema and repeat a BMP in 1-2 weeks.  She should try to drink a little more water, if possible.

## 2021-05-04 NOTE — Telephone Encounter (Signed)
I spoke with the patient regarding his lab results and Dr. Serita Kyle recommendations to:  1) Decrease furosemide to 20 mg once daily as needed for edema 2) Repeat a BMP in 1-2 weeks 3) Increase water intake to hydrate the kidneys  The patient voices understanding of these results and recommendations. She does confirm that she has been using lasix 40 mg once daily most days for lower extremity edema. I inquired if this has given her complete resolution of her swelling and she advised it some days she sees a difference and other days, she does not see much effect.   I have advised the patient that some of her swelling may be due to venous insufficiency and that the fluid pill will not really help in this regard. I have advised she: 1) Decrease the furosemide to 20 mg once daily as needed as per Dr. 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

## 2021-05-13 NOTE — Addendum Note (Signed)
Addended by: Lanny Hurst on: 05/13/2021 05:11 PM   Modules accepted: Orders

## 2021-05-13 NOTE — Telephone Encounter (Signed)
Per scheduling: Patient is unable to come into our office for her labwork. She would like to go to the [facility] Please change order.   BM[facility].

## 2021-05-14 ENCOUNTER — Other Ambulatory Visit: Payer: Commercial Managed Care - PPO

## 2021-05-15 ENCOUNTER — Telehealth: Payer: Self-pay | Admitting: Nurse Practitioner

## 2021-05-15 DIAGNOSIS — I5022 Chronic systolic (congestive) heart failure: Secondary | ICD-10-CM

## 2021-05-15 MED ORDER — ROSUVASTATIN CALCIUM 40 MG PO TABS: 40.0000 mg | ORAL_TABLET | Freq: Every day | ORAL | 3 refills | Status: DC

## 2021-05-15 NOTE — Telephone Encounter (Signed)
Patient calling in and needing her rosuvastatin sent in. Former [employer] patient that has not yet established with a new provider.    Pended for your approval or denial.  Nailyn Dearinger,cma

## 2021-05-15 NOTE — Telephone Encounter (Signed)
This patient has not been seen in 13 months in our office.  I have declined this medication.  She needs to establish with a new PCP.  She should contact her cardiologist to obtain a refill from them as she has been seeing them.

## 2021-05-15 NOTE — Telephone Encounter (Signed)
Patient calling in and needing her rosuvastatin sent in. Former [employer] patient that has not yet established with a new provider.   Pended for your approval or denial.

## 2021-05-15 NOTE — Telephone Encounter (Signed)
Please refill the patient's rosuvastatin 40 mg daily with 90 tablets and 3 refills.  Also, can we place a referral for her to establish with primary care at [facility], as she has not had any success in establishing with a [facility] practice?  Please let me know if any issues come up.  Thanks.  Yvonne Kendall, MD [facility]

## 2021-05-15 NOTE — Telephone Encounter (Signed)
I called the patient and informed her that the medication requested was denied due to her not being seen in over 13 months, I informed  the patient that she could get the medication from her cardiologist and she understood.  Bonnie Ryan,cma

## 2021-05-28 ENCOUNTER — Telehealth: Payer: Commercial Managed Care - PPO | Admitting: Internal Medicine

## 2021-05-28 ENCOUNTER — Telehealth: Payer: Self-pay

## 2021-05-28 NOTE — Telephone Encounter (Signed)
-----   Message from Wiliam Ke, RN sent at 05/13/2021  1:07 PM EDT ----- Regarding: virtual visit Lanier Prude, MD sent to End, Cristal Deer, MD; Wiliam Ke, RN; Baird Lyons, RN; Sampson Goon, RN   I can chat with her via virtual visit to discuss starting an antiarrhythmic drug.    Please schedule virtual visit with Dr. Lalla Brothers to discuss starting AAD.  Thank you Boneta Lucks

## 2021-05-28 NOTE — Progress Notes (Deleted)
{Choose 1 Note Type (Video or Telephone):[phone]}    Date:  05/28/2021   ID:  Bonnie Ryan, DOB [birth year], MRN [id number] The patient was identified using 2 identifiers.  {Patient [phone]::"Home"} {Provider Location:[phone]::"Home Office"}   PCP:  Patient, No Pcp Per (Inactive)   [facility] Providers Cardiologist:  Yvonne Kendall, MD Electrophysiologist:  Lanier Prude, MD { Click to update primary MD,subspecialty MD or APP then REFRESH:1}    Evaluation Performed:  {Choose Visit Type:[id number]::"Follow-Up Visit"}  Chief Complaint:  ***  History of Present Illness:    Bonnie Ryan is a 65 y.o. female with ***  The patient {does/does not:200015} have symptoms concerning for COVID-19 infection (fever, chills, cough, or new shortness of breath).    Past Medical History:  Diagnosis Date   Diabetes mellitus    Previously diet-controlled   Endometriosis    Family history of adverse reaction to anesthesia    mother used to have vomiting after    Hepatic steatosis 04/15/2020   Hypercholesteremia    Hypertension    Insomnia 06/03/2020   Mitral Valve Prolapse w/ Mitral regurgitation    a. 10/2009 Echo: Mod MVP, mild MR; b. 07/2019 Echo: EF 60-65%, Gr2 DD, Mod MR; c. 07/2019 TEE: EF 60-65%. No rwma. Mod dil LA. Sev MR w/ sev bileaflet prolapse. Mild TR; d. 08/2019 s/p minimally invasive MV repair.   Non-obstructive CAD (coronary artery disease)    a. 09/2006 Cath: LAD 30, RCA 25; b.07/2011 Cath: LM Ca2+, 30-2mLAD, RCA 30-40p, EF 55-65%, 2+ MR; c. 07/2019 Cath: LM 61m/d, LAD 67m, RI nl, LCX 67m, RCA 45p, 32m.   Post-op atrial fibrillation (HCC)    a. 08/2019 following MV repair-->Amio/warfarin initiated.  Initially prolonged QT w/ IV amio-->converted to sinus.   RLS (restless legs syndrome)    S/P minimally invasive mitral valve repair 08/30/2019   a. 08/2019 s/p Complex valvuloplasty including artificial Goretex neochord placement x12 with plication of  posterior leaflet and 40 mm Sorin Memo 4D ring annuloplasty via right mini thoracotomy approach.   UTI (urinary tract infection)    Past Surgical History:  Procedure Laterality Date   ABDOMINAL HYSTERECTOMY     tah/bso   APPENDECTOMY     BUBBLE STUDY  08/09/2019   Procedure: BUBBLE STUDY;  Surgeon: Sande Rives, MD;  Location: [facility] ENDOSCOPY;  Service: Cardiology;;   CHOLECYSTECTOMY     KNEE SURGERY     LAPAROSCOPY     MITRAL VALVE REPAIR Right 08/30/2019   Procedure: MINIMALLY INVASIVE MITRAL VALVE REPAIR (MVR) USING MEMO 4D SIZE 40;  Surgeon: Purcell Nails, MD;  Location: [facility] OR;  Service: Open Heart Surgery;  Laterality: Right;   RIGHT/LEFT HEART CATH AND CORONARY ANGIOGRAPHY N/A 08/09/2019   Procedure: RIGHT/LEFT HEART CATH AND CORONARY ANGIOGRAPHY;  Surgeon: Yvonne Kendall, MD;  Location: [facility] INVASIVE CV LAB;  Service: Cardiovascular;  Laterality: N/A;   TEE WITHOUT CARDIOVERSION N/A 08/09/2019   Procedure: TRANSESOPHAGEAL ECHOCARDIOGRAM (TEE);  Surgeon: Sande Rives, MD;  Location: [facility] ENDOSCOPY;  Service: Cardiology;  Laterality: N/A;   TEE WITHOUT CARDIOVERSION N/A 08/30/2019   Procedure: TRANSESOPHAGEAL ECHOCARDIOGRAM (TEE);  Surgeon: Purcell Nails, MD;  Location: [facility] OR;  Service: Open Heart Surgery;  Laterality: N/A;   US ECHOCARDIOGRAPHY     EF 55-60%     No outpatient medications have been marked as taking for the 05/28/21 encounter (Appointment) with Chue Berkovich, Cristal Deer, MD.     Allergies:   Penicillins   Social History   Tobacco Use  Smoking status: Former    Packs/day: 0.25    Years: 20.00    Pack years: 5.00    Types: Cigarettes    Quit date: 07/24/2015    Years since quitting: 5.8   Smokeless tobacco: Never  Substance Use Topics   Alcohol use: Yes    Alcohol/week: 5.0 standard drinks    Types: 5 Standard drinks or equivalent per week    Comment: occassionally   Drug use: No     Family Hx: The patient's family history includes Breast  cancer (age of onset: 4) in her mother; Diabetes in her mother; Hypertension in her father. There is no history of Ovarian cancer, Colon cancer, or Heart disease.  ROS:   Please see the history of present illness.    *** All other systems reviewed and are negative.   Prior CV studies:   The following studies were reviewed today:  ***  Labs/Other Tests and Data Reviewed:    EKG:  {EKG/Telemetry Strips Reviewed:[phone]}  Recent Labs: 05/01/2021: ALT 33; BUN 19; Creatinine, Ser 1.16; Hemoglobin 13.4; Platelets 179; Potassium 3.2; Sodium 137; TSH 1.170   Recent Lipid Panel Lab Results  Component Value Date/Time   CHOL 135 05/01/2021 05:14 PM   TRIG 284 (H) 05/01/2021 05:14 PM   HDL 42 05/01/2021 05:14 PM   CHOLHDL 3.2 05/01/2021 05:14 PM   LDLCALC 36 05/01/2021 05:14 PM   LDLCALC 91 11/30/2019 04:53 PM   LDLDIRECT 47.0 04/09/2020 03:28 PM    Wt Readings from Last 3 Encounters:  05/01/21 181 lb (82.1 kg)  03/04/21 181 lb (82.1 kg)  11/26/20 182 lb (82.6 kg)     Risk Assessment/Calculations:   {Does this patient have ATRIAL FIBRILLATION?[phone]}      Objective:    Vital Signs:  LMP  (LMP Unknown)    {[facility] Virtual Exam (Optional):[phone]::"VITAL SIGNS:  reviewed"}  ASSESSMENT & PLAN:    ***   {Are you ordering a CV Procedure (e.g. stress test, cath, DCCV, TEE, etc)?   Press F2        :[id number]}    COVID-19 Education: The signs and symptoms of COVID-19 were discussed with the patient and how to seek care for testing (follow up with PCP or arrange E-visit).  ***The importance of social distancing was discussed today.  Time:   Today, I have spent *** minutes with the patient with telehealth technology discussing the above problems.     Medication Adjustments/Labs and Tests Ordered: Current medicines are reviewed at length with the patient today.  Concerns regarding medicines are outlined above.   Tests Ordered: No orders of the defined types  were placed in this encounter.   Medication Changes: No orders of the defined types were placed in this encounter.   Follow Up:  {F/U Format:[phone]} {follow up:15908}  Signed, Yvonne Kendall, MD  05/28/2021 8:04 AM    [facility]

## 2021-05-28 NOTE — Telephone Encounter (Signed)
Patient states she does not feel like she needs a virtual visit, and asks if she needs to be on any more medication to call her pharmacy . Please call and discuss with patient.

## 2021-06-01 NOTE — Telephone Encounter (Signed)
Sent mychart message to Pt advising need office visit-virtual or inperson-prior to starting AAD therapy.  Await response.

## 2021-06-01 NOTE — Telephone Encounter (Signed)
Pt has viewed MyChart response.  Await further needs.

## 2021-06-16 ENCOUNTER — Other Ambulatory Visit: Payer: Self-pay | Admitting: Internal Medicine

## 2021-06-16 DIAGNOSIS — Z1231 Encounter for screening mammogram for malignant neoplasm of breast: Secondary | ICD-10-CM

## 2021-07-27 ENCOUNTER — Other Ambulatory Visit: Payer: Self-pay | Admitting: Cardiology

## 2021-07-28 NOTE — Telephone Encounter (Signed)
This is a Bonnie Ryan pt 

## 2021-10-26 ENCOUNTER — Other Ambulatory Visit: Payer: Self-pay | Admitting: Cardiology

## 2021-10-27 NOTE — Telephone Encounter (Signed)
Please contact pt for future appointment. Pt due for 6 month f/u. 

## 2021-10-30 NOTE — Telephone Encounter (Signed)
Scheduled

## 2022-01-24 ENCOUNTER — Other Ambulatory Visit: Payer: Self-pay | Admitting: Internal Medicine

## 2022-01-25 ENCOUNTER — Other Ambulatory Visit: Payer: Self-pay | Admitting: Cardiology

## 2022-02-04 NOTE — Progress Notes (Signed)
Cardiology Office Note:    Date:  02/05/2022   ID:  Bonnie Ryan, DOB April 29, 1956, MRN [id number]  PCP:  Patient, No Pcp Per  [facility] Cardiologist:  Yvonne Kendallhristopher End, MD  [facility] Electrophysiologist:  Lanier PrudeAMERON T LAMBERT, MD  Referring MD: No ref. provider found  Chief Complaint: 6 month follow-up  History of Present Illness:    Bonnie Ryan is a 66 y.o. 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   Mitral valve prolapse diagnosed >10 years ago. Prior evaluation with normal LVef, moderate MR, and nonobstructive CAD by cath 2012. Established with Dr. Liliane ChannelEnd ealry 2020 noting decline in functional capacity. Echocardiogram 06/2019 with normal LVEF and moderate to severe MR. TEE with severe bileaflet MVP and severe MR. Diagnostic catheterization with nonobstructive CAD. Referred to CT surgery and underwent successful minimally invasive mitral valve repair on August 30, 2019. Postoperatively she had atrial fibrillation treated with IV amiodarone. Converted to SR and amiodarone was discontinued due to prolonged QT. After discussion with EP, started Amiodarone 200mg  daily. Follow up with A. Fib clinic 09/11/19 she was in atrial fib with fatigue. Amiodarone increased to 200mg  BID and then reduced to daily one week later as she was in SR with QTc 536. On 09/27/19 her Amiodarone was reduced to 100mg  daily due to QTc 520. She was in the office 10/19/19 for echocardiogram 10/19/2019 and noted to be symptomatic by the technician.  As such, she was transitioned into an office visit and noted worsening she is shortness of breath and dyspnea on exertion and lightheadedness within the last several weeks.  Orthostatic blood pressures were normal.  Her losartan was reduced due to low normal blood pressure with systolic  100-110.  Seen by Dr. Okey DupreEnd 10/24/19.  Her echo was read as 30 to 35% on his personal review of images estimated closer to 45%.  Noted to not be unexpected have some decline in LVEF following treatment of severe MR.  Her furosemide 20 to 40 mg daily based on weight and edema was continued as well as her losartan 25 mg daily.  Her metoprolol tartrate was transitioned to metoprolol succinate.  Her amiodarone was discontinued as it was concerning to potentially be contributing to her symptoms.  Seen in follow up 10/31/19. She was maintaining NSR despite discontinuation of amiodarone.  Echo March 2022 showed normal LVEF, G2DD and normally functioning mitral valve.   The patient was last seen 05/01/21 and was complaining of persistent fatigue, dizziness, lightheadedness and palpitations. Dr. Lalla BrothersLambert had been titrating her metoprolol. AAA was considered given symptoms. Regular activity was recommended. It was recommended she do a virtual visit with EP, but this was never scheduled.   Today, EKG shows NSR. BP is low, but this is normal for her. She has persistent dizziness and lightheadedness. There are no triggers, can occur at rest or with exertion. Due to her symptoms she is afraid of going to the gym and developing dizziness. She feels she has been gaining weight since she has not been going to the gym. She plans on being more active over the summer. We discussed EP visit to consider AAA, and she is agreeable. She denies chest pain, significant SOB, LLE, orthopnea, or pnd.   Past Medical History:  Diagnosis Date   Diabetes mellitus    Previously diet-controlled   Endometriosis  Family history of adverse reaction to anesthesia    mother used to have vomiting after    Hepatic steatosis 04/15/2020   Hypercholesteremia    Hypertension    Insomnia 06/03/2020   Mitral Valve Prolapse w/ Mitral regurgitation    a. 10/2009 Echo: Mod MVP, mild MR; b. 07/2019 Echo: EF 60-65%, Gr2 DD, Mod MR; c. 07/2019 TEE: EF  60-65%. No rwma. Mod dil LA. Sev MR w/ sev bileaflet prolapse. Mild TR; d. 08/2019 s/p minimally invasive MV repair.   Non-obstructive CAD (coronary artery disease)    a. 09/2006 Cath: LAD 30, RCA 25; b.07/2011 Cath: LM Ca2+, 30-70mLAD, RCA 30-40p, EF 55-65%, 2+ MR; c. 07/2019 Cath: LM 60m/d, LAD 85m, RI nl, LCX 56m, RCA 45p, 13m.   Post-op atrial fibrillation (HCC)    a. 08/2019 following MV repair-->Amio/warfarin initiated.  Initially prolonged QT w/ IV amio-->converted to sinus.   RLS (restless legs syndrome)    S/P minimally invasive mitral valve repair 08/30/2019   a. 08/2019 s/p Complex valvuloplasty including artificial Goretex neochord placement x12 with plication of posterior leaflet and 40 mm Sorin Memo 4D ring annuloplasty via right mini thoracotomy approach.   UTI (urinary tract infection)     Past Surgical History:  Procedure Laterality Date   ABDOMINAL HYSTERECTOMY     tah/bso   APPENDECTOMY     BUBBLE STUDY  08/09/2019   Procedure: BUBBLE STUDY;  Surgeon: Sande Rives, MD;  Location: [facility] ENDOSCOPY;  Service: Cardiology;;   CHOLECYSTECTOMY     KNEE SURGERY     LAPAROSCOPY     MITRAL VALVE REPAIR Right 08/30/2019   Procedure: MINIMALLY INVASIVE MITRAL VALVE REPAIR (MVR) USING MEMO 4D SIZE 40;  Surgeon: Purcell Nails, MD;  Location: [facility] OR;  Service: Open Heart Surgery;  Laterality: Right;   RIGHT/LEFT HEART CATH AND CORONARY ANGIOGRAPHY N/A 08/09/2019   Procedure: RIGHT/LEFT HEART CATH AND CORONARY ANGIOGRAPHY;  Surgeon: Yvonne Kendall, MD;  Location: [facility] INVASIVE CV LAB;  Service: Cardiovascular;  Laterality: N/A;   TEE WITHOUT CARDIOVERSION N/A 08/09/2019   Procedure: TRANSESOPHAGEAL ECHOCARDIOGRAM (TEE);  Surgeon: Sande Rives, MD;  Location: [facility] ENDOSCOPY;  Service: Cardiology;  Laterality: N/A;   TEE WITHOUT CARDIOVERSION N/A 08/30/2019   Procedure: TRANSESOPHAGEAL ECHOCARDIOGRAM (TEE);  Surgeon: Purcell Nails, MD;  Location: [facility] OR;  Service: Open Heart  Surgery;  Laterality: N/A;   US ECHOCARDIOGRAPHY     EF 55-60%    Current Medications: Current Meds  Medication Sig   aspirin EC 81 MG tablet Take 1 tablet (81 mg total) by mouth daily.   furosemide (LASIX) 40 MG tablet Take 0.5 tablet (20 mg) by mouth once daily as needed   Insulin Pen Needle (PEN NEEDLES) 31G X 6 MM MISC Use to inject Victoza   KLOR-CON M20 20 MEQ tablet TAKE 1 TABLET BY MOUTH EVERY DAY   losartan (COZAAR) 25 MG tablet TAKE 1 TABLET BY MOUTH EVERY DAY   metoprolol succinate (TOPROL-XL) 50 MG 24 hr tablet TAKE 1 TABLET BY MOUTH EVERY DAY WITH OR IMMEDIATELY FOLLOWING A MEAL   ONETOUCH VERIO test strip USE AS INSTRUCTED USE TO TEST BLOOD GLUCOSE DAILY. DX E11.9   rosuvastatin (CRESTOR) 40 MG tablet Take 1 tablet (40 mg total) by mouth daily.   VICTOZA 18 MG/3ML SOPN INJECT 1.8 MG UNDER THE SKIN ONCE DAILY     Allergies:   Penicillins   Social History   Socioeconomic History   Marital status: Divorced    Spouse  name: Not on file   Number of children: Not on file   Years of education: Not on file   Highest education level: Not on file  Occupational History   Not on file  Tobacco Use   Smoking status: Former    Packs/day: 0.25    Years: 20.00    Total pack years: 5.00    Types: Cigarettes    Quit date: 07/24/2015    Years since quitting: 6.5   Smokeless tobacco: Never  Vaping Use   Vaping Use: Not on file  Substance and Sexual Activity   Alcohol use: Yes    Alcohol/week: 5.0 standard drinks of alcohol    Types: 5 Standard drinks or equivalent per week    Comment: occassionally   Drug use: No   Sexual activity: Not Currently    Birth control/protection: Surgical    Comment: Hysterectomy  Other Topics Concern   Not on file  Social History Narrative   Not on file   Social Determinants of Health   Financial Resource Strain: Not on file  Food Insecurity: Not on file  Transportation Needs: Not on file  Physical Activity: Not on file  Stress: Not on  file  Social Connections: Not on file     Family History: The patient's family history includes Breast cancer (age of onset: 47) in her mother; Diabetes in her mother; Hypertension in her father. There is no history of Ovarian cancer, Colon cancer, or Heart disease.  ROS:   Please see the history of present illness.     All other systems reviewed and are negative.  EKGs/Labs/Other Studies Reviewed:    The following studies were reviewed today:  Echo 10/2020  1. Left ventricular ejection fraction, by estimation, is 55 to 60%. The  left ventricle has normal function. The left ventricle has no regional  wall motion abnormalities. Left ventricular diastolic parameters are  consistent with Grade II diastolic  dysfunction (pseudonormalization).   2. Right ventricular systolic function is normal. The right ventricular  size is normal.   3. Left atrial size was mildly dilated.   4. The mitral valve has been repaired/replaced. No evidence of mitral  valve regurgitation. There is a prosthetic annuloplasty ring present in  the mitral position. Procedure Date: 08/2019.   5. The aortic valve is tricuspid. Aortic valve regurgitation is not  visualized. Mild aortic valve sclerosis is present, with no evidence of  aortic valve stenosis.   6. The inferior vena cava is dilated in size with >50% respiratory  variability, suggesting right atrial pressure of 8 mmHg.   Heart monitor 10/2020 The patient was monitored for 14 days. The predominant rhythm was sinus with an average rate of 84 bpm (range 67-111 bpm in sinus). There were rare PAC's and occasional PVC's (burden 1.8%). There were 5 episodes of nonsustained ventricular tachycardia lasting up to 5 beats with a maximum rate of 184 bpm. 178 atrial runs occurred, lasting up to 48:20 minutes with a maximum rate of 122 bpm. No prolong pause or atrial fibrillation was observed. Patient triggered events correspond to sinus rhythm and PVC's.    Predominantly sinus rhythm with multiple runs of SVT (lasting up to 48 minutes), as well as occasional PVC's, rare PAC's, and rare episodes of NSVT.  Echo 09/2019  1. Left ventricular ejection fraction, by estimation, is 30 to 35%. The  left ventricle has moderately decreased function. The left ventricle  demonstrates global hypokinesis, unable to exclude regional wall motion  abnormality. There  is mild left  ventricular hypertrophy. Left ventricular diastolic parameters are  consistent with Grade I diastolic dysfunction (impaired relaxation).   2. Right ventricular systolic function is normal. The right ventricular  size is normal. There is normal pulmonary artery systolic pressure. The  estimated right ventricular systolic pressure is 28.8 mmHg.   3. s/p mitral valve annuloplasty 08/30/2019, The mitral valve is not well  visualized, No evidence of mitral valve regurgitation. No evidence of  mitral stenosis.   4. Challenging image quality, definity used.   5. Compared to echo dated 08/03/2019, EF has decreased from 60%. mitral  valve regurgitation has resolved.   R/L heart cath 07/2019 Conclusions: Mild to moderate, non-obstructive coronary artery disease. Normal left and right heart filling pressure. Normal Fick cardiac output/index.   Recommendations: Decrease furosemide to 20 mg daily as needed for edema/weight gain.  Additional potassium supplementation given prior to discharge home today. Proceed with cardiac surgery evaluation for mitral valve intervention. Medical therapy and risk factor modification to prevent progression of coronary artery disease.   Yvonne Kendall, MD [facility]     EKG:  EKG is  ordered today.  The ekg ordered today demonstrates NSR 72bpm, nonspecific ST/T wave changes  Recent Labs: 05/01/2021: ALT 33; BUN 19; Creatinine, Ser 1.16; Hemoglobin 13.4; Platelets 179; Potassium 3.2; Sodium 137; TSH 1.170  Recent Lipid Panel    Component Value  Date/Time   CHOL 135 05/01/2021 1714   TRIG 284 (H) 05/01/2021 1714   HDL 42 05/01/2021 1714   CHOLHDL 3.2 05/01/2021 1714   VLDL 57 (H) 05/01/2021 1714   LDLCALC 36 05/01/2021 1714   LDLCALC 91 11/30/2019 1653   LDLDIRECT 47.0 04/09/2020 1528     Physical Exam:    VS:  BP 90/64 (BP Location: Left Arm, Patient Position: Sitting, Cuff Size: Normal)   Pulse 72   Ht 5\' 7"  (1.702 m)   Wt 186 lb (84.4 kg)   LMP  (LMP Unknown)   BMI 29.13 kg/m     Wt Readings from Last 3 Encounters:  02/05/22 186 lb (84.4 kg)  05/01/21 181 lb (82.1 kg)  03/04/21 181 lb (82.1 kg)     GEN:  Well nourished, well developed in no acute distress HEENT: Normal NECK: No JVD; No carotid bruits LYMPHATICS: No lymphadenopathy CARDIAC: RRR, no murmurs, rubs, gallops RESPIRATORY:  Clear to auscultation without rales, wheezing or rhonchi  ABDOMEN: Soft, non-tender, non-distended MUSCULOSKELETAL:  No edema; No deformity  SKIN: Warm and dry NEUROLOGIC:  Alert and oriented x 3 PSYCHIATRIC:  Normal affect   ASSESSMENT:    1. Dizziness   2. Mitral valve prolapse   3. Coronary artery disease involving native coronary artery of native heart without angina pectoris   4. Palpitations   5. SVT (supraventricular tachycardia) (HCC)   6. Chronic systolic heart failure (HCC)   7. Chronic systolic (congestive) heart failure (HCC)   8. History of atrial fibrillation   9. Coronary artery disease involving native coronary artery of native heart with angina pectoris (HCC)   10. Hyperlipidemia, mixed    PLAN:    In order of problems listed above:  Dizziness/Palpitations pSVT H/o post-op Afib Previously on amiodarone for post-op Afib; it was discontinued due to SOB and dizziness. Today EKG shows NSR. Also previously on warfarin. She reports persistent dizziness that affects her quality of life. Dizziness may be from low BP vs pSVT. She is on Toprol XL 50mg  daily for rate control. BP is low, however this  is normal  for her. Metoprolol dose has been titrated in the past with no real improvement of symptoms. At the last visit it was suggested she see EP to consider AAA, however appointment was never made. I recommended she see EP as a virtual visit.   HFrEF due valvular heart disease with normalization of EF Patient is euvolemic on exam. She reports exertional dyspnea, but is not active at baseline due to fear of dizziness. I recommend she increase activity level. I will update annual echocardiogram. Most recent echo 10/2020 showed LVEF 55-60%, G2DD, no WMA, normally functioning mitral valve.   Nonobstructive CAD Cardiac cath prior to her MV repair showed moderate CAD. She denies chest pain. Continue Aspirin, statin and metoprolol. No further ischemic work-up required.   HLD LDL 36 04/2021. Continued statin therapy.   Disposition: Follow up in 1 year(s) with MD/APP   Signed, Aeisha Minarik David Stall, PA-C  02/05/2022 10:07 AM    [facility]

## 2022-02-05 ENCOUNTER — Telehealth: Payer: Self-pay | Admitting: Cardiology

## 2022-02-05 ENCOUNTER — Encounter: Payer: Self-pay | Admitting: Medical

## 2022-02-05 ENCOUNTER — Ambulatory Visit: Payer: Commercial Managed Care - PPO | Admitting: Medical

## 2022-02-05 VITALS — BP 90/64 | HR 72 | Ht 67.0 in | Wt 186.0 lb

## 2022-02-05 DIAGNOSIS — I2510 Atherosclerotic heart disease of native coronary artery without angina pectoris: Secondary | ICD-10-CM

## 2022-02-05 DIAGNOSIS — R42 Dizziness and giddiness: Secondary | ICD-10-CM | POA: Diagnosis not present

## 2022-02-05 DIAGNOSIS — R002 Palpitations: Secondary | ICD-10-CM

## 2022-02-05 DIAGNOSIS — I341 Nonrheumatic mitral (valve) prolapse: Secondary | ICD-10-CM | POA: Diagnosis not present

## 2022-02-05 DIAGNOSIS — I25119 Atherosclerotic heart disease of native coronary artery with unspecified angina pectoris: Secondary | ICD-10-CM

## 2022-02-05 DIAGNOSIS — Z8679 Personal history of other diseases of the circulatory system: Secondary | ICD-10-CM

## 2022-02-05 DIAGNOSIS — I5022 Chronic systolic (congestive) heart failure: Secondary | ICD-10-CM

## 2022-02-05 DIAGNOSIS — E782 Mixed hyperlipidemia: Secondary | ICD-10-CM

## 2022-02-05 DIAGNOSIS — I471 Supraventricular tachycardia: Secondary | ICD-10-CM

## 2022-02-05 NOTE — Telephone Encounter (Signed)
Patient AVS needs appt with Lalla Brothers, patient stated at checkout will schedule later.

## 2022-02-05 NOTE — Patient Instructions (Signed)
Medication Instructions:  Your physician recommends that you continue on your current medications as directed. Please refer to the Current Medication list given to you today.  *If you need a refill on your cardiac medications before your next appointment, please call your pharmacy*   Lab Work: None ordered If you have labs (blood work) drawn today and your tests are completely normal, you will receive your results only by: MyChart Message (if you have MyChart) OR A paper copy in the mail If you have any lab test that is abnormal or we need to change your treatment, we will call you to review the results.   Testing/Procedures: Your physician has requested that you have an echocardiogram. Echocardiography is a painless test that uses sound waves to create images of your heart. It provides your doctor with information about the size and shape of your heart and how well your heart's chambers and valves are working. This procedure takes approximately one hour. There are no restrictions for this procedure.    Follow-Up: At [facility], you and your health needs are our priority.  As part of our continuing mission to provide you with exceptional heart care, we have created designated Provider Care Teams.  These Care Teams include your primary Cardiologist (physician) and Advanced Practice Providers (APPs -  Physician Assistants and Nurse Practitioners) who all work together to provide you with the care you need, when you need it.  We recommend signing up for the patient portal called "MyChart".  Sign up information is provided on this After Visit Summary.  MyChart is used to connect with patients for Virtual Visits (Telemedicine).  Patients are able to view lab/test results, encounter notes, upcoming appointments, etc.  Non-urgent messages can be sent to your provider as well.   To learn more about what you can do with MyChart, go to [email or web address].    Your next appointment:     Virtual appt with EP (Dr. Lalla Brothers)    Your physician wants you to follow-up in: 1 year You will receive a reminder letter in the mail two months in advance. If you don't receive a letter, please call our office to schedule the follow-up appointment.   The format for your next appointment:   In Person  Provider:   You may see Yvonne Kendall, MD or one of the following Advanced Practice Providers on your designated Care Team:   Nicolasa Ducking, NP Eula Listen, PA-C Cadence Fransico Michael, PA-C{   Other Instructions N/A  Important Information About Sugar

## 2022-02-20 ENCOUNTER — Other Ambulatory Visit: Payer: Self-pay | Admitting: Internal Medicine

## 2022-03-12 ENCOUNTER — Ambulatory Visit (INDEPENDENT_AMBULATORY_CARE_PROVIDER_SITE_OTHER): Payer: Commercial Managed Care - PPO

## 2022-03-12 DIAGNOSIS — I341 Nonrheumatic mitral (valve) prolapse: Secondary | ICD-10-CM

## 2022-03-12 LAB — ECHOCARDIOGRAM COMPLETE
AR max vel: 2.22 cm2
AV Area VTI: 2.31 cm2
AV Area mean vel: 2.19 cm2
AV Mean grad: 3 mmHg
AV Peak grad: 5.3 mmHg
Ao pk vel: 1.15 m/s
Area-P 1/2: 2.46 cm2
Calc EF: 63.7 %
MV VTI: 2.17 cm2
S' Lateral: 3.4 cm
Single Plane A2C EF: 64.3 %
Single Plane A4C EF: 63.1 %

## 2022-04-19 ENCOUNTER — Other Ambulatory Visit: Payer: Self-pay | Admitting: Cardiology

## 2022-04-28 ENCOUNTER — Ambulatory Visit: Payer: Commercial Managed Care - PPO | Attending: Cardiology | Admitting: Cardiology

## 2022-04-28 ENCOUNTER — Encounter: Payer: Self-pay | Admitting: Cardiology

## 2022-04-28 VITALS — BP 110/60 | HR 69 | Ht 67.0 in | Wt 182.0 lb

## 2022-04-28 DIAGNOSIS — R42 Dizziness and giddiness: Secondary | ICD-10-CM

## 2022-04-28 DIAGNOSIS — Z9889 Other specified postprocedural states: Secondary | ICD-10-CM

## 2022-04-28 DIAGNOSIS — I5022 Chronic systolic (congestive) heart failure: Secondary | ICD-10-CM | POA: Diagnosis not present

## 2022-04-28 DIAGNOSIS — I341 Nonrheumatic mitral (valve) prolapse: Secondary | ICD-10-CM | POA: Diagnosis not present

## 2022-04-28 NOTE — Progress Notes (Signed)
Electrophysiology Office Follow up Visit Note:    Date:  04/28/2022   ID:  Bonnie Ryan, DOB 09-May-1956, MRN [id number]  PCP:  Patient, No Pcp Per  [facility] Cardiologist:  Yvonne Kendall, MD  [facility] Electrophysiologist:  Lanier Prude, MD    Interval History:    Bonnie Ryan is a 66 y.o. female who presents for a follow up visit. They were last seen in clinic March 04, 2021.  That visit was for palpitations.  She was on a beta-blocker at the time of our last appointment.  We plan to wean her metoprolol given symptoms of dizziness and lightheadedness.  The patient also carries a history of mitral valve repair and heart failure with recovered ejection fraction.  The patient last saw Cadence Furth in clinic on February 05, 2022.  At the appointment with cadence, the patient reported persistent dizziness and lightheadedness.  Today she reports continued episodes of dizziness and lightheadedness that have been ongoing for years.  She thinks they may be more frequent recently.  She has not lost consciousness.  No clear triggers.  She tells me that sometimes she feels palpitations but these episodes of lightheadedness and dizziness do not correspond to when she experiences palpitations.    Past Medical History:  Diagnosis Date   Diabetes mellitus    Previously diet-controlled   Endometriosis    Family history of adverse reaction to anesthesia    mother used to have vomiting after    Hepatic steatosis 04/15/2020   Hypercholesteremia    Hypertension    Insomnia 06/03/2020   Mitral Valve Prolapse w/ Mitral regurgitation    a. 10/2009 Echo: Mod MVP, mild MR; b. 07/2019 Echo: EF 60-65%, Gr2 DD, Mod MR; c. 07/2019 TEE: EF 60-65%. No rwma. Mod dil LA. Sev MR w/ sev bileaflet prolapse. Mild TR; d. 08/2019 s/p minimally invasive MV repair.   Non-obstructive CAD (coronary artery disease)    a. 09/2006 Cath: LAD 30, RCA 25; b.07/2011 Cath: LM Ca2+, 30-17mLAD, RCA 30-40p, EF 55-65%, 2+  MR; c. 07/2019 Cath: LM 54m/d, LAD 87m, RI nl, LCX 79m, RCA 45p, 15m.   Post-op atrial fibrillation (HCC)    a. 08/2019 following MV repair-->Amio/warfarin initiated.  Initially prolonged QT w/ IV amio-->converted to sinus.   RLS (restless legs syndrome)    S/P minimally invasive mitral valve repair 08/30/2019   a. 08/2019 s/p Complex valvuloplasty including artificial Goretex neochord placement x12 with plication of posterior leaflet and 40 mm Sorin Memo 4D ring annuloplasty via right mini thoracotomy approach.   UTI (urinary tract infection)     Past Surgical History:  Procedure Laterality Date   ABDOMINAL HYSTERECTOMY     tah/bso   APPENDECTOMY     BUBBLE STUDY  08/09/2019   Procedure: BUBBLE STUDY;  Surgeon: Sande Rives, MD;  Location: [facility] ENDOSCOPY;  Service: Cardiology;;   CHOLECYSTECTOMY     KNEE SURGERY     LAPAROSCOPY     MITRAL VALVE REPAIR Right 08/30/2019   Procedure: MINIMALLY INVASIVE MITRAL VALVE REPAIR (MVR) USING MEMO 4D SIZE 40;  Surgeon: Purcell Nails, MD;  Location: [facility] OR;  Service: Open Heart Surgery;  Laterality: Right;   RIGHT/LEFT HEART CATH AND CORONARY ANGIOGRAPHY N/A 08/09/2019   Procedure: RIGHT/LEFT HEART CATH AND CORONARY ANGIOGRAPHY;  Surgeon: Yvonne Kendall, MD;  Location: [facility] INVASIVE CV LAB;  Service: Cardiovascular;  Laterality: N/A;   TEE WITHOUT CARDIOVERSION N/A 08/09/2019   Procedure: TRANSESOPHAGEAL ECHOCARDIOGRAM (TEE);  Surgeon: Sande Rives,  MD;  Location: [place];  Service: Cardiology;  Laterality: N/A;   TEE WITHOUT CARDIOVERSION N/A 08/30/2019   Procedure: TRANSESOPHAGEAL ECHOCARDIOGRAM (TEE);  Surgeon: Rexene Alberts, MD;  Location: [place];  Service: Open Heart Surgery;  Laterality: N/A;   US ECHOCARDIOGRAPHY     EF 55-60%    Current Medications: Current Meds  Medication Sig   aspirin EC 81 MG tablet Take 1 tablet (81 mg total) by mouth daily.   furosemide (LASIX) 40 MG tablet Take 0.5 tablet (20 mg) by mouth once  daily as needed   Insulin Pen Needle (PEN NEEDLES) 31G X 6 MM MISC Use to inject Victoza   KLOR-CON M20 20 MEQ tablet TAKE 1 TABLET BY MOUTH EVERY DAY   losartan (COZAAR) 25 MG tablet TAKE 1 TABLET BY MOUTH EVERY DAY   metFORMIN (GLUCOPHAGE) 500 MG tablet Take 1 tablet (500 mg total) by mouth 2 (two) times daily with a meal.   metoprolol succinate (TOPROL-XL) 50 MG 24 hr tablet TAKE 1 TABLET BY MOUTH EVERY DAY WITH OR IMMEDIATELY FOLLOWING A MEAL   ONETOUCH VERIO test strip USE AS INSTRUCTED USE TO TEST BLOOD GLUCOSE DAILY. DX E11.9   rosuvastatin (CRESTOR) 40 MG tablet Take 1 tablet (40 mg total) by mouth daily.   VICTOZA 18 MG/3ML SOPN INJECT 1.8 MG UNDER THE SKIN ONCE DAILY     Allergies:   Penicillins   Social History   Socioeconomic History   Marital status: Divorced    Spouse name: Not on file   Number of children: Not on file   Years of education: Not on file   Highest education level: Not on file  Occupational History   Not on file  Tobacco Use   Smoking status: Former    Packs/day: 0.25    Years: 20.00    Total pack years: 5.00    Types: Cigarettes    Quit date: 07/24/2015    Years since quitting: 6.7   Smokeless tobacco: Never  Vaping Use   Vaping Use: Not on file  Substance and Sexual Activity   Alcohol use: Yes    Alcohol/week: 5.0 standard drinks of alcohol    Types: 5 Standard drinks or equivalent per week    Comment: occassionally   Drug use: No   Sexual activity: Not Currently    Birth control/protection: Surgical    Comment: Hysterectomy  Other Topics Concern   Not on file  Social History Narrative   Not on file   Social Determinants of Health   Financial Resource Strain: Not on file  Food Insecurity: Not on file  Transportation Needs: Not on file  Physical Activity: Not on file  Stress: Not on file  Social Connections: Not on file     Family History: The patient's family history includes Breast cancer (age of onset: 63) in her mother;  Diabetes in her mother; Hypertension in her father. There is no history of Ovarian cancer, Colon cancer, or Heart disease.  ROS:   Please see the history of present illness.    All other systems reviewed and are negative.  EKGs/Labs/Other Studies Reviewed:    The following studies were reviewed today:  November 03, 2020 ZIO monitor The patient was monitored for 14 days. The predominant rhythm was sinus with an average rate of 84 bpm (range 67-111 bpm in sinus). There were rare PAC's and occasional PVC's (burden 1.8%). There were 5 episodes of nonsustained ventricular tachycardia lasting up to 5 beats with a maximum  rate of 184 bpm. 178 atrial runs occurred, lasting up to 48:20 minutes with a maximum rate of 122 bpm. No prolong pause or atrial fibrillation was observed. Patient triggered events correspond to sinus rhythm and PVC's.      Recent Labs: 05/01/2021: ALT 33; BUN 19; Creatinine, Ser 1.16; Hemoglobin 13.4; Platelets 179; Potassium 3.2; Sodium 137; TSH 1.170  Recent Lipid Panel    Component Value Date/Time   CHOL 135 05/01/2021 1714   TRIG 284 (H) 05/01/2021 1714   HDL 42 05/01/2021 1714   CHOLHDL 3.2 05/01/2021 1714   VLDL 57 (H) 05/01/2021 1714   LDLCALC 36 05/01/2021 1714   LDLCALC 91 11/30/2019 1653   LDLDIRECT 47.0 04/09/2020 1528    Physical Exam:    VS:  BP 110/60 (BP Location: Left Arm, Patient Position: Sitting, Cuff Size: Normal)   Pulse 69   Ht 5\' 7"  (1.702 m)   Wt 182 lb (82.6 kg)   LMP  (LMP Unknown)   SpO2 98%   BMI 28.51 kg/m     Wt Readings from Last 3 Encounters:  04/28/22 182 lb (82.6 kg)  02/05/22 186 lb (84.4 kg)  05/01/21 181 lb (82.1 kg)     GEN:  Well nourished, well developed in no acute distress HEENT: Normal NECK: No JVD; No carotid bruits LYMPHATICS: No lymphadenopathy CARDIAC: RRR, no murmurs, rubs, gallops RESPIRATORY:  Clear to auscultation without rales, wheezing or rhonchi  ABDOMEN: Soft, non-tender,  non-distended MUSCULOSKELETAL:  No edema; No deformity  SKIN: Warm and dry NEUROLOGIC:  Alert and oriented x 3 PSYCHIATRIC:  Normal affect        ASSESSMENT:    1. SVT (supraventricular tachycardia) (HCC)   2. Chronic systolic (congestive) heart failure (HCC)   3. Mitral valve prolapse   4. S/P mitral valve repair    PLAN:    In order of problems listed above:  #Lightheadedness I suspect her symptoms are related to dysautonomia.  No clear arrhythmic cause based on prior ZIO monitor results.  I do not think repeat heart monitoring is indicated at this time.  I would recommend continuing her current medical therapy including metoprolol succinate.  #Chronic systolic heart failure Recovered ejection fraction.  NYHA class I.  Warm and dry on exam.  Continue current medical therapy including Lasix daily, losartan, metoprolol.  #Mitral valve repair history Repair intact based on exam and echo findings.  Follow-up with EP on an as-needed basis.  Continue routine general cardiology follow-up.   Medication Adjustments/Labs and Tests Ordered: Current medicines are reviewed at length with the patient today.  Concerns regarding medicines are outlined above.  No orders of the defined types were placed in this encounter.  No orders of the defined types were placed in this encounter.    Signed, 07/01/21, MD, [facility], [facility] 04/28/2022 4:12 PM    Electrophysiology [facility]

## 2022-04-28 NOTE — Patient Instructions (Signed)
Medication Instructions:  none *If you need a refill on your cardiac medications before your next appointment, please call your pharmacy*   Lab Work: none If you have labs (blood work) drawn today and your tests are completely normal, you will receive your results only by: MyChart Message (if you have MyChart) OR A paper copy in the mail If you have any lab test that is abnormal or we need to change your treatment, we will call you to review the results.   Testing/Procedures: none   Follow-Up: At [facility], you and your health needs are our priority.  As part of our continuing mission to provide you with exceptional heart care, we have created designated Provider Care Teams.  These Care Teams include your primary Cardiologist (physician) and Advanced Practice Providers (APPs -  Physician Assistants and Nurse Practitioners) who all work together to provide you with the care you need, when you need it.  We recommend signing up for the patient portal called "MyChart".  Sign up information is provided on this After Visit Summary.  MyChart is used to connect with patients for Virtual Visits (Telemedicine).  Patients are able to view lab/test results, encounter notes, upcoming appointments, etc.  Non-urgent messages can be sent to your provider as well.   To learn more about what you can do with MyChart, go to [email or web address].    Your next appointment:   As needed    Provider:   Cameron Lambert, MD    Other Instructions none  Important Information About Sugar       

## 2022-05-06 ENCOUNTER — Encounter: Payer: Self-pay | Admitting: *Deleted

## 2022-05-06 DIAGNOSIS — Z006 Encounter for examination for normal comparison and control in clinical research program: Secondary | ICD-10-CM

## 2022-05-06 NOTE — Research (Unsigned)
Message left for Bonnie Ryan to see if she is interested in coming in for [employer] research. Encouraged her to call back.

## 2022-05-19 ENCOUNTER — Other Ambulatory Visit: Payer: Self-pay | Admitting: Internal Medicine

## 2022-09-30 ENCOUNTER — Encounter (HOSPITAL_COMMUNITY): Payer: Self-pay | Admitting: *Deleted

## 2022-12-22 ENCOUNTER — Other Ambulatory Visit: Payer: Self-pay | Admitting: Internal Medicine

## 2022-12-22 DIAGNOSIS — Z1231 Encounter for screening mammogram for malignant neoplasm of breast: Secondary | ICD-10-CM

## 2023-01-17 ENCOUNTER — Other Ambulatory Visit: Payer: Self-pay | Admitting: Internal Medicine

## 2023-01-18 ENCOUNTER — Ambulatory Visit
Admission: RE | Admit: 2023-01-18 | Discharge: 2023-01-18 | Disposition: A | Discharge status: Home / Self Care | Payer: Commercial Managed Care - PPO | Source: Ambulatory Visit | Attending: Internal Medicine | Admitting: Internal Medicine

## 2023-01-18 DIAGNOSIS — Z1231 Encounter for screening mammogram for malignant neoplasm of breast: Secondary | ICD-10-CM | POA: Insufficient documentation

## 2023-01-18 NOTE — Telephone Encounter (Signed)
Hi,   Could you schedule this patient a 12 month follow up visit? The patient was last seen by Cadence on 02-05-22. Thank you so much.

## 2023-01-18 NOTE — Telephone Encounter (Signed)
Pt scheduled on 7/5

## 2023-01-31 ENCOUNTER — Other Ambulatory Visit: Payer: Self-pay | Admitting: Internal Medicine

## 2023-01-31 DIAGNOSIS — N1832 Chronic kidney disease, stage 3b: Secondary | ICD-10-CM

## 2023-02-03 ENCOUNTER — Ambulatory Visit
Admission: RE | Admit: 2023-02-03 | Discharge: 2023-02-03 | Disposition: A | Discharge status: Home / Self Care | Payer: Commercial Managed Care - PPO | Source: Ambulatory Visit | Attending: Internal Medicine | Admitting: Internal Medicine

## 2023-02-03 DIAGNOSIS — N1832 Chronic kidney disease, stage 3b: Secondary | ICD-10-CM | POA: Insufficient documentation

## 2023-02-23 NOTE — Progress Notes (Signed)
Cardiology Office Note:    Date:  02/25/2023   ID:  Chery Olvey, DOB 12-01-1955, MRN [id number]  PCP:  Enid Baas, MD  [facility] Cardiologist:  Yvonne Kendall, MD  [facility] Electrophysiologist:  Lanier Prude, MD   Referring MD: No ref. provider found   Chief Complaint: 1 year follow-up  History of Present Illness:    Bonnie Ryan is a 67 y.o. 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   Mitral valve prolapse diagnosed >10 years ago. Prior evaluation with normal LVEf, moderate MR, and nonobstructive CAD by cath 2012. Established with Dr. Okey Dupre early 2020 noting decline in functional capacity. Echocardiogram 06/2019 with normal LVEF and moderate to severe MR. TEE with severe bileaflet MVP and severe MR. Diagnostic catheterization with nonobstructive CAD. Referred to CT surgery and underwent successful minimally invasive mitral valve repair on August 30, 2019. Postoperatively she had atrial fibrillation treated with IV amiodarone. Converted to SR and amiodarone was discontinued due to prolonged QT. After discussion with EP, started Amiodarone 200mg  daily. Follow up with A. Fib clinic 09/11/19 she was in atrial fib with fatigue. Amiodarone increased to 200mg  BID and then reduced to daily one week later as she was in SR with QTc 536. On 09/27/19 her Amiodarone was reduced to 100mg  daily due to QTc 520. She was in the office 10/19/19 for echocardiogram 10/19/2019 and noted to be symptomatic by the technician.  As such, she was transitioned into an office visit and noted worsening she is shortness of breath and dyspnea on exertion and lightheadedness within the last several weeks.  Orthostatic blood pressures were normal.  Her losartan was reduced due to low normal blood pressure with systolic 100-110.   Seen  by Dr. Okey Dupre on 10/24/19. Her echo was read as 30 to 35% on his personal review of images estimated closer to 45%.  Noted to not be unexpected have some decline in LVEF following treatment of severe MR.  Her furosemide was increased from 20 to 40 mg daily based on weight and edema was continued as well as her losartan 25 mg daily.  Her metoprolol tartrate was transitioned to metoprolol succinate.  Her amiodarone was discontinued as it was concerning to potentially be contributing to her symptoms.   Seen in follow up 10/31/19. She was maintaining NSR despite discontinuation of amiodarone.   Echo March 2022 showed normal LVEF, G2DD and normally functioning mitral valve.    The patient was seen 05/01/21 and was complaining of persistent fatigue, dizziness, lightheadedness and palpitations. Dr. Lalla Brothers had been titrating her metoprolol. AA was considered given symptoms. Regular activity was recommended. It was recommended she do a virtual visit with EP, but this was never scheduled.  She was last seen 02/05/22 reporting dizziness from low BP vs p SVT. She was sent back to EP. EP felt dizziness was due to dysautonomia.   Today, the patient reports persistent unchanged dizziness. Orthostatics are negative today. BP is low, but this seems to be normal  for the patient. She reports chest pain. Center of the chest and goes through to the back. It lasts 3-4 minutes. It's a sharp pain and she has trouble breathing. IT occurs once a week.    Past Medical History:  Diagnosis Date   Diabetes mellitus    Previously diet-controlled  Endometriosis    Family history of adverse reaction to anesthesia    mother used to have vomiting after    Hepatic steatosis 04/15/2020   Hypercholesteremia    Hypertension    Insomnia 06/03/2020   Mitral Valve Prolapse w/ Mitral regurgitation    a. 10/2009 Echo: Mod MVP, mild MR; b. 07/2019 Echo: EF 60-65%, Gr2 DD, Mod MR; c. 07/2019 TEE: EF 60-65%. No rwma. Mod dil LA. Sev MR w/ sev  bileaflet prolapse. Mild TR; d. 08/2019 s/p minimally invasive MV repair.   Non-obstructive CAD (coronary artery disease)    a. 09/2006 Cath: LAD 30, RCA 25; b.07/2011 Cath: LM Ca2+, 30-24mLAD, RCA 30-40p, EF 55-65%, 2+ MR; c. 07/2019 Cath: LM 54m/d, LAD 29m, RI nl, LCX 18m, RCA 45p, 57m.   Post-op atrial fibrillation (HCC)    a. 08/2019 following MV repair-->Amio/warfarin initiated.  Initially prolonged QT w/ IV amio-->converted to sinus.   RLS (restless legs syndrome)    S/P minimally invasive mitral valve repair 08/30/2019   a. 08/2019 s/p Complex valvuloplasty including artificial Goretex neochord placement x12 with plication of posterior leaflet and 40 mm Sorin Memo 4D ring annuloplasty via right mini thoracotomy approach.   UTI (urinary tract infection)     Past Surgical History:  Procedure Laterality Date   ABDOMINAL HYSTERECTOMY     tah/bso   APPENDECTOMY     BUBBLE STUDY  08/09/2019   Procedure: BUBBLE STUDY;  Surgeon: Sande Rives, MD;  Location: [facility] ENDOSCOPY;  Service: Cardiology;;   CHOLECYSTECTOMY     KNEE SURGERY     LAPAROSCOPY     MITRAL VALVE REPAIR Right 08/30/2019   Procedure: MINIMALLY INVASIVE MITRAL VALVE REPAIR (MVR) USING MEMO 4D SIZE 40;  Surgeon: Purcell Nails, MD;  Location: [facility] OR;  Service: Open Heart Surgery;  Laterality: Right;   RIGHT/LEFT HEART CATH AND CORONARY ANGIOGRAPHY N/A 08/09/2019   Procedure: RIGHT/LEFT HEART CATH AND CORONARY ANGIOGRAPHY;  Surgeon: Yvonne Kendall, MD;  Location: [facility] INVASIVE CV LAB;  Service: Cardiovascular;  Laterality: N/A;   TEE WITHOUT CARDIOVERSION N/A 08/09/2019   Procedure: TRANSESOPHAGEAL ECHOCARDIOGRAM (TEE);  Surgeon: Sande Rives, MD;  Location: [facility] ENDOSCOPY;  Service: Cardiology;  Laterality: N/A;   TEE WITHOUT CARDIOVERSION N/A 08/30/2019   Procedure: TRANSESOPHAGEAL ECHOCARDIOGRAM (TEE);  Surgeon: Purcell Nails, MD;  Location: [facility] OR;  Service: Open Heart Surgery;  Laterality: N/A;   US  ECHOCARDIOGRAPHY     EF 55-60%    Current Medications: Current Meds  Medication Sig   fenofibrate (TRICOR) 145 MG tablet Take 145 mg by mouth daily.   furosemide (LASIX) 40 MG tablet Take 0.5 tablet (20 mg) by mouth once daily as needed   losartan (COZAAR) 25 MG tablet TAKE 1 TABLET BY MOUTH EVERY DAY   MOUNJARO 12.5 MG/0.5ML Pen Inject 12.5 mg into the skin once a week.   nitroGLYCERIN (NITROSTAT) 0.4 MG SL tablet Place 1 tablet (0.4 mg total) under the tongue every 5 (five) minutes as needed for chest pain.   ONETOUCH VERIO test strip USE AS INSTRUCTED USE TO TEST BLOOD GLUCOSE DAILY. DX E11.9   potassium chloride SA (KLOR-CON M20) 20 MEQ tablet TAKE 1 TABLET BY MOUTH EVERY DAY   rosuvastatin (CRESTOR) 20 MG tablet Take 20 mg by mouth daily.   [DISCONTINUED] metoprolol succinate (TOPROL-XL) 50 MG 24 hr tablet TAKE 1 TABLET BY MOUTH EVERY DAY WITH OR IMMEDIATELY FOLLOWING A MEAL     Allergies:   Penicillins   Social History  Socioeconomic History   Marital status: Divorced    Spouse name: Not on file   Number of children: Not on file   Years of education: Not on file   Highest education level: Not on file  Occupational History   Not on file  Tobacco Use   Smoking status: Former    Packs/day: 0.25    Years: 20.00    Additional pack years: 0.00    Total pack years: 5.00    Types: Cigarettes    Quit date: 07/24/2015    Years since quitting: 7.5   Smokeless tobacco: Never  Vaping Use   Vaping Use: Not on file  Substance and Sexual Activity   Alcohol use: Yes    Alcohol/week: 5.0 standard drinks of alcohol    Types: 5 Standard drinks or equivalent per week    Comment: occassionally   Drug use: No   Sexual activity: Not Currently    Birth control/protection: Surgical    Comment: Hysterectomy  Other Topics Concern   Not on file  Social History Narrative   Not on file   Social Determinants of Health   Financial Resource Strain: Not on file  Food Insecurity: Not on  file  Transportation Needs: Not on file  Physical Activity: Not on file  Stress: Not on file  Social Connections: Not on file     Family History: The patient's family history includes Breast cancer (age of onset: 67) in her mother; Diabetes in her mother; Hypertension in her father. There is no history of Ovarian cancer, Colon cancer, or Heart disease.  ROS:   Please see the history of present illness.     All other systems reviewed and are negative.  EKGs/Labs/Other Studies Reviewed:    The following studies were reviewed today:  Echo 10/2020  1. Left ventricular ejection fraction, by estimation, is 55 to 60%. The  left ventricle has normal function. The left ventricle has no regional  wall motion abnormalities. Left ventricular diastolic parameters are  consistent with Grade II diastolic  dysfunction (pseudonormalization).   2. Right ventricular systolic function is normal. The right ventricular  size is normal.   3. Left atrial size was mildly dilated.   4. The mitral valve has been repaired/replaced. No evidence of mitral  valve regurgitation. There is a prosthetic annuloplasty ring present in  the mitral position. Procedure Date: 08/2019.   5. The aortic valve is tricuspid. Aortic valve regurgitation is not  visualized. Mild aortic valve sclerosis is present, with no evidence of  aortic valve stenosis.   6. The inferior vena cava is dilated in size with >50% respiratory  variability, suggesting right atrial pressure of 8 mmHg.    Heart monitor 10/2020 The patient was monitored for 14 days. The predominant rhythm was sinus with an average rate of 84 bpm (range 67-111 bpm in sinus). There were rare PAC's and occasional PVC's (burden 1.8%). There were 5 episodes of nonsustained ventricular tachycardia lasting up to 5 beats with a maximum rate of 184 bpm. 178 atrial runs occurred, lasting up to 48:20 minutes with a maximum rate of 122 bpm. No prolong pause or atrial  fibrillation was observed. Patient triggered events correspond to sinus rhythm and PVC's.   Predominantly sinus rhythm with multiple runs of SVT (lasting up to 48 minutes), as well as occasional PVC's, rare PAC's, and rare episodes of NSVT.   Echo 09/2019  1. Left ventricular ejection fraction, by estimation, is 30 to 35%. The  left ventricle  has moderately decreased function. The left ventricle  demonstrates global hypokinesis, unable to exclude regional wall motion  abnormality. There is mild left  ventricular hypertrophy. Left ventricular diastolic parameters are  consistent with Grade I diastolic dysfunction (impaired relaxation).   2. Right ventricular systolic function is normal. The right ventricular  size is normal. There is normal pulmonary artery systolic pressure. The  estimated right ventricular systolic pressure is 28.8 mmHg.   3. s/p mitral valve annuloplasty 08/30/2019, The mitral valve is not well  visualized, No evidence of mitral valve regurgitation. No evidence of  mitral stenosis.   4. Challenging image quality, definity used.   5. Compared to echo dated 08/03/2019, EF has decreased from 60%. mitral  valve regurgitation has resolved.    R/L heart cath 07/2019 Conclusions: Mild to moderate, non-obstructive coronary artery disease. Normal left and right heart filling pressure. Normal Fick cardiac output/index.   Recommendations: Decrease furosemide to 20 mg daily as needed for edema/weight gain.  Additional potassium supplementation given prior to discharge home today. Proceed with cardiac surgery evaluation for mitral valve intervention. Medical therapy and risk factor modification to prevent progression of coronary artery disease.   Yvonne Kendall, MD [facility]  EKG:  EKG is ordered today.  The ekg ordered today demonstrates NSR, 1st degree AV block, TWI V1 and V2  Recent Labs: No results found for requested labs within last 365 days.  Recent Lipid  Panel    Component Value Date/Time   CHOL 135 05/01/2021 1714   TRIG 284 (H) 05/01/2021 1714   HDL 42 05/01/2021 1714   CHOLHDL 3.2 05/01/2021 1714   VLDL 57 (H) 05/01/2021 1714   LDLCALC 36 05/01/2021 1714   LDLCALC 91 11/30/2019 1653   LDLDIRECT 47.0 04/09/2020 1528     Physical Exam:    VS:  BP (!) 90/58 (BP Location: Left Arm, Patient Position: Sitting, Cuff Size: Normal)   Pulse 78   Ht 5\' 7"  (1.702 m)   Wt 170 lb (77.1 kg)   LMP  (LMP Unknown)   SpO2 98%   BMI 26.63 kg/m     Wt Readings from Last 3 Encounters:  02/25/23 170 lb (77.1 kg)  04/28/22 182 lb (82.6 kg)  02/05/22 186 lb (84.4 kg)     GEN:  Well nourished, well developed in no acute distress HEENT: Normal NECK: No JVD; No carotid bruits LYMPHATICS: No lymphadenopathy CARDIAC: RRR, + murmur, no rubs, gallops RESPIRATORY:  Clear to auscultation without rales, wheezing or rhonchi  ABDOMEN: Soft, non-tender, non-distended MUSCULOSKELETAL:  No edema; No deformity  SKIN: Warm and dry NEUROLOGIC:  Alert and oriented x 3 PSYCHIATRIC:  Normal affect   ASSESSMENT:    1. Dysautonomia (HCC)   2. Dizziness   3. Chronic systolic heart failure (HCC)   4. Coronary artery disease involving native coronary artery of native heart with other form of angina pectoris (HCC)   5. SVT (supraventricular tachycardia)   6. History of atrial fibrillation   7. Mitral valve prolapse   8. Hyperlipidemia, mixed    PLAN:    In order of problems listed above:  Dizziness Dysautonomia This is a chronic issue since CABG surgery. She previously saw EP and they felt dizziness is due to dysautonomia. Prior heart monitor was unremarkable. BP low today, but orthostatics negative. I will decrease Toprol to 25mg  daily. Recommended abdominal binder and compression socks for symptoms.   Chest pain Nonobstructive CAD Cardiac cath prior to MV repair showed moderate CAD. She  reports sharp chest pain once a week, low suspicion for cardiac  etiology. I will give her SL NTG to use as needed. We discuissed noninvasive cardiac testing, but she would like to wait at this time. Continue Toprol and Crestor.   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 Echo 02/2022 showed LVEF 55-60%, no WMA, mild LVH, G1DD.Continue lasix 20mg  daily, Losartan 25mg  daily, Toprol 25mg  daily and potassium. I will update an echo today.   pSVT H/o post-op Afib Patient was previously on warfarin and amiodarone. No further episodes of Afib. Continue Toprol for rate control.   HLD LDL 26. Continue Crestor.    Disposition: Follow up in 1 year(s) with MD/APP    Signed, Zaira Iacovelli David Stall, PA-C  02/25/2023 2:55 PM    [facility]

## 2023-02-25 ENCOUNTER — Encounter: Payer: Self-pay | Admitting: Medical

## 2023-02-25 ENCOUNTER — Ambulatory Visit: Payer: Commercial Managed Care - PPO | Attending: Medical | Admitting: Medical

## 2023-02-25 VITALS — BP 90/58 | HR 78 | Ht 67.0 in | Wt 170.0 lb

## 2023-02-25 DIAGNOSIS — I5022 Chronic systolic (congestive) heart failure: Secondary | ICD-10-CM

## 2023-02-25 DIAGNOSIS — I471 Supraventricular tachycardia, unspecified: Secondary | ICD-10-CM

## 2023-02-25 DIAGNOSIS — R42 Dizziness and giddiness: Secondary | ICD-10-CM

## 2023-02-25 DIAGNOSIS — I341 Nonrheumatic mitral (valve) prolapse: Secondary | ICD-10-CM

## 2023-02-25 DIAGNOSIS — I25118 Atherosclerotic heart disease of native coronary artery with other forms of angina pectoris: Secondary | ICD-10-CM | POA: Diagnosis not present

## 2023-02-25 DIAGNOSIS — G901 Familial dysautonomia [Riley-Day]: Secondary | ICD-10-CM | POA: Diagnosis not present

## 2023-02-25 DIAGNOSIS — Z8679 Personal history of other diseases of the circulatory system: Secondary | ICD-10-CM

## 2023-02-25 DIAGNOSIS — E782 Mixed hyperlipidemia: Secondary | ICD-10-CM

## 2023-02-25 MED ORDER — NITROGLYCERIN 0.4 MG SL SUBL: 0.4000 mg | SUBLINGUAL_TABLET | SUBLINGUAL | 3 refills | Status: AC | PRN

## 2023-02-25 MED ORDER — METOPROLOL SUCCINATE ER 25 MG PO TB24: 25.0000 mg | ORAL_TABLET | Freq: Every day | ORAL | 3 refills | Status: DC

## 2023-02-25 NOTE — Patient Instructions (Signed)
Medication Instructions:  DECREASE the Metoprolol (Toprol) to 25 mg once daily  Take Nitroglycerin as needed for chest pain: Place one tablet under the tongue as needed every 5 minutes for chest pain. If you have to use three tablets with no relief, please call 911 or go to your closest Emergency Department.    *If you need a refill on your cardiac medications before your next appointment, please call your pharmacy*   Lab Work: None ordered If you have labs (blood work) drawn today and your tests are completely normal, you will receive your results only by: MyChart Message (if you have MyChart) OR A paper copy in the mail If you have any lab test that is abnormal or we need to change your treatment, we will call you to review the results.   Testing/Procedures: Your physician has requested that you have an echocardiogram. Echocardiography is a painless test that uses sound waves to create images of your heart. It provides your doctor with information about the size and shape of your heart and how well your heart's chambers and valves are working.   You may receive an ultrasound enhancing agent through an IV if needed to better visualize your heart during the echo. This procedure takes approximately one hour.  There are no restrictions for this procedure.  This will take place at [address] [facility], [place] [address]    Follow-Up: At [facility], you and your health needs are our priority.  As part of our continuing mission to provide you with exceptional heart care, we have created designated Provider Care Teams.  These Care Teams include your primary Cardiologist (physician) and Advanced Practice Providers (APPs -  Physician Assistants and Nurse Practitioners) who all work together to provide you with the care you need, when you need it.  We recommend signing up for the patient portal called "MyChart".  Sign up information is provided on this  After Visit Summary.  MyChart is used to connect with patients for Virtual Visits (Telemedicine).  Patients are able to view lab/test results, encounter notes, upcoming appointments, etc.  Non-urgent messages can be sent to your provider as well.   To learn more about what you can do with MyChart, go to [email or web address].    Your next appointment:   12 month(s)  Provider:   You may see Yvonne Kendall, MD or one of the following Advanced Practice Providers on your designated Care Team:   Nicolasa Ducking, NP Eula Listen, PA-C Cadence Fransico Michael, PA-C Charlsie Quest, NP

## 2023-02-27 ENCOUNTER — Other Ambulatory Visit: Payer: Self-pay | Admitting: Internal Medicine

## 2023-03-17 ENCOUNTER — Ambulatory Visit: Payer: Commercial Managed Care - PPO

## 2023-03-17 DIAGNOSIS — I341 Nonrheumatic mitral (valve) prolapse: Secondary | ICD-10-CM

## 2023-03-17 LAB — ECHOCARDIOGRAM COMPLETE
Area-P 1/2: 3.03 cm2
S' Lateral: 3.4 cm

## 2023-03-17 MED ORDER — PERFLUTREN LIPID MICROSPHERE
1.0000 mL | INTRAVENOUS | Status: AC | PRN
Start: 2023-03-17 — End: 2023-03-17
  Administered 2023-03-17: 2 mL via INTRAVENOUS

## 2023-04-18 ENCOUNTER — Other Ambulatory Visit: Payer: Self-pay | Admitting: Internal Medicine

## 2023-06-15 ENCOUNTER — Encounter: Payer: Self-pay | Admitting: Internal Medicine

## 2023-06-15 ENCOUNTER — Other Ambulatory Visit: Payer: Self-pay

## 2023-06-22 ENCOUNTER — Encounter
Admission: RE | Disposition: A | Discharge status: Home / Self Care | Payer: Self-pay | Source: Home / Self Care | Attending: Internal Medicine

## 2023-06-22 ENCOUNTER — Ambulatory Visit: Payer: Commercial Managed Care - PPO | Admitting: Anesthesiology

## 2023-06-22 ENCOUNTER — Encounter: Payer: Self-pay | Admitting: Internal Medicine

## 2023-06-22 ENCOUNTER — Ambulatory Visit
Admission: RE | Admit: 2023-06-22 | Discharge: 2023-06-22 | Disposition: A | Discharge status: Home / Self Care | Payer: Commercial Managed Care - PPO | Attending: Internal Medicine | Admitting: Internal Medicine

## 2023-06-22 ENCOUNTER — Other Ambulatory Visit: Payer: Self-pay

## 2023-06-22 DIAGNOSIS — E1122 Type 2 diabetes mellitus with diabetic chronic kidney disease: Secondary | ICD-10-CM | POA: Diagnosis not present

## 2023-06-22 DIAGNOSIS — I5020 Unspecified systolic (congestive) heart failure: Secondary | ICD-10-CM | POA: Insufficient documentation

## 2023-06-22 DIAGNOSIS — Z1211 Encounter for screening for malignant neoplasm of colon: Secondary | ICD-10-CM | POA: Insufficient documentation

## 2023-06-22 DIAGNOSIS — D128 Benign neoplasm of rectum: Secondary | ICD-10-CM | POA: Diagnosis not present

## 2023-06-22 DIAGNOSIS — E7800 Pure hypercholesterolemia, unspecified: Secondary | ICD-10-CM | POA: Insufficient documentation

## 2023-06-22 DIAGNOSIS — Z7985 Long-term (current) use of injectable non-insulin antidiabetic drugs: Secondary | ICD-10-CM | POA: Insufficient documentation

## 2023-06-22 DIAGNOSIS — I358 Other nonrheumatic aortic valve disorders: Secondary | ICD-10-CM | POA: Diagnosis not present

## 2023-06-22 DIAGNOSIS — Z79899 Other long term (current) drug therapy: Secondary | ICD-10-CM | POA: Insufficient documentation

## 2023-06-22 DIAGNOSIS — K5730 Diverticulosis of large intestine without perforation or abscess without bleeding: Secondary | ICD-10-CM | POA: Diagnosis not present

## 2023-06-22 DIAGNOSIS — Z87891 Personal history of nicotine dependence: Secondary | ICD-10-CM | POA: Insufficient documentation

## 2023-06-22 DIAGNOSIS — K640 First degree hemorrhoids: Secondary | ICD-10-CM | POA: Insufficient documentation

## 2023-06-22 DIAGNOSIS — I4891 Unspecified atrial fibrillation: Secondary | ICD-10-CM | POA: Insufficient documentation

## 2023-06-22 DIAGNOSIS — N189 Chronic kidney disease, unspecified: Secondary | ICD-10-CM | POA: Insufficient documentation

## 2023-06-22 DIAGNOSIS — I2510 Atherosclerotic heart disease of native coronary artery without angina pectoris: Secondary | ICD-10-CM | POA: Insufficient documentation

## 2023-06-22 DIAGNOSIS — Z860100 Personal history of colon polyps, unspecified: Secondary | ICD-10-CM | POA: Diagnosis not present

## 2023-06-22 DIAGNOSIS — G2581 Restless legs syndrome: Secondary | ICD-10-CM | POA: Insufficient documentation

## 2023-06-22 DIAGNOSIS — I341 Nonrheumatic mitral (valve) prolapse: Secondary | ICD-10-CM | POA: Diagnosis not present

## 2023-06-22 DIAGNOSIS — D124 Benign neoplasm of descending colon: Secondary | ICD-10-CM | POA: Insufficient documentation

## 2023-06-22 DIAGNOSIS — I130 Hypertensive heart and chronic kidney disease with heart failure and stage 1 through stage 4 chronic kidney disease, or unspecified chronic kidney disease: Secondary | ICD-10-CM | POA: Insufficient documentation

## 2023-06-22 HISTORY — DX: Chronic kidney disease, unspecified: N18.9

## 2023-06-22 HISTORY — PX: POLYPECTOMY: SHX5525

## 2023-06-22 HISTORY — PX: COLONOSCOPY WITH PROPOFOL: SHX5780

## 2023-06-22 LAB — GLUCOSE, CAPILLARY: Glucose-Capillary: 77 mg/dL (ref 70–99)

## 2023-06-22 SURGERY — COLONOSCOPY WITH PROPOFOL
Anesthesia: General

## 2023-06-22 MED ORDER — SODIUM CHLORIDE 0.9 % IV SOLN: INTRAVENOUS | Status: DC

## 2023-06-22 MED ORDER — LIDOCAINE HCL (CARDIAC) PF 100 MG/5ML IV SOSY
PREFILLED_SYRINGE | INTRAVENOUS | Status: DC | PRN
  Administered 2023-06-22: 50 mg via INTRAVENOUS

## 2023-06-22 MED ORDER — PROPOFOL 500 MG/50ML IV EMUL
INTRAVENOUS | Status: DC | PRN
  Administered 2023-06-22: 150 ug/kg/min via INTRAVENOUS

## 2023-06-22 MED ORDER — PROPOFOL 10 MG/ML IV BOLUS
INTRAVENOUS | Status: DC | PRN
  Administered 2023-06-22: 80 mg via INTRAVENOUS

## 2023-06-22 MED ORDER — EPHEDRINE SULFATE-NACL 50-0.9 MG/10ML-% IV SOSY
PREFILLED_SYRINGE | INTRAVENOUS | Status: DC | PRN
  Administered 2023-06-22 (×2): 10 mg via INTRAVENOUS

## 2023-06-22 NOTE — Interval H&P Note (Signed)
History and Physical Interval Note:  06/22/2023 12:25 PM  Bonnie Ryan  has presented today for surgery, with the diagnosis of Z86.0100 (ICD-10-CM) - Personal history of colon polyps, unspecified.  The various methods of treatment have been discussed with the patient and family. After consideration of risks, benefits and other options for treatment, the patient has consented to  Procedure(s) with comments: COLONOSCOPY WITH PROPOFOL (N/A) - pt requests early as a surgical intervention.  The patient's history has been reviewed, patient examined, no change in status, stable for surgery.  I have reviewed the patient's chart and labs.  Questions were answered to the patient's satisfaction.     [place], [place]

## 2023-06-22 NOTE — Anesthesia Procedure Notes (Signed)
Procedure Name: MAC Date/Time: 06/22/2023 12:49 PM  Performed by: Cheral Bay, CRNAPre-anesthesia Checklist: Patient identified, Emergency Drugs available, Suction available, Patient being monitored and Timeout performed Patient Re-evaluated:Patient Re-evaluated prior to induction Oxygen Delivery Method: Nasal cannula Induction Type: IV induction Placement Confirmation: positive ETCO2 and CO2 detector

## 2023-06-22 NOTE — Anesthesia Preprocedure Evaluation (Addendum)
Anesthesia Evaluation  Patient identified by MRN, date of birth, ID band Patient awake    Reviewed: Allergy & Precautions, H&P , NPO status , Patient's Chart, lab work & pertinent test results  Airway Mallampati: II  TM Distance: >3 FB Neck ROM: full    Dental no notable dental hx.    Pulmonary former smoker   Pulmonary exam normal        Cardiovascular hypertension, + CAD (Non-obstructive)  Normal cardiovascular exam  S/p minimally invasive mitral valve repair 08/30/2019  Echo 10/2020  1. Left ventricular ejection fraction, by estimation, is 55 to 60%. The  left ventricle has normal function. The left ventricle has no regional  wall motion abnormalities. Left ventricular diastolic parameters are  consistent with Grade II diastolic  dysfunction (pseudonormalization).   2. Right ventricular systolic function is normal. The right ventricular  size is normal.   3. Left atrial size was mildly dilated.   4. The mitral valve has been repaired/replaced. No evidence of mitral  valve regurgitation. There is a prosthetic annuloplasty ring present in  the mitral position. Procedure Date: 08/2019.   5. The aortic valve is tricuspid. Aortic valve regurgitation is not  visualized. Mild aortic valve sclerosis is present, with no evidence of  aortic valve stenosis.   6. The inferior vena cava is dilated in size with >50% respiratory  variability, suggesting right atrial pressure of 8 mmHg.       Neuro/Psych negative neurological ROS  negative psych ROS   GI/Hepatic negative GI ROS, Neg liver ROS,,,  Endo/Other  diabetes    Renal/GU Renal InsufficiencyRenal disease  negative genitourinary   Musculoskeletal   Abdominal Normal abdominal exam  (+)   Peds  Hematology negative hematology ROS (+)   Anesthesia Other Findings Per Cardiology note 7/24: Dizziness Dysautonomia This is a chronic issue since CABG surgery. She previously  saw EP and they felt dizziness is due to dysautonomia. Prior heart monitor was unremarkable. BP low today, but orthostatics negative. I will decrease Toprol to 25mg  daily. Recommended abdominal binder and compression socks for symptoms.    Chest pain Nonobstructive CAD Cardiac cath prior to MV repair showed moderate CAD. She reports sharp chest pain once a week, low suspicion for cardiac etiology. I will give her SL NTG to use as needed. We discuissed noninvasive cardiac testing, but she would like to wait at this time. Continue Toprol and Crestor.    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 Echo 02/2022 showed LVEF 55-60%, no WMA, mild LVH, G1DD.Continue lasix 20mg  daily, Losartan 25mg  daily, Toprol 25mg  daily and potassium.   pSVT H/o post-op Afib Patient was previously on warfarin and amiodarone. No further episodes of Afib. Continue Toprol for rate control.    Past Medical History: No date: Diabetes mellitus     Comment:  Previously diet-controlled No date: Endometriosis No date: Family history of adverse reaction to anesthesia     Comment:  mother used to have vomiting after  04/15/2020: Hepatic steatosis No date: Hypercholesteremia No date: Hypertension 06/03/2020: Insomnia No date: Mitral Valve Prolapse w/ Mitral regurgitation     Comment:  a. 10/2009 Echo: Mod MVP, mild MR; b. 07/2019 Echo: EF               60-65%, Gr2 DD, Mod MR; c. 07/2019 TEE: EF 60-65%. No               rwma. Mod dil LA. Sev  MR w/ sev bileaflet prolapse. Mild               TR; d. 08/2019 s/p minimally invasive MV repair. No date: Non-obstructive CAD (coronary artery disease)     Comment:  a. 09/2006 Cath: LAD 30, RCA 25; b.07/2011 Cath: LM Ca2+,              30-47mLAD, RCA 30-40p, EF 55-65%, 2+ MR; c. 07/2019 Cath:              LM 78m/d, LAD 16m, RI nl, LCX 36m, RCA 45p, 9m. No date: Post-op atrial fibrillation (HCC)     Comment:  a. 08/2019 following MV  repair-->Amio/warfarin initiated.              Initially prolonged QT w/ IV amio-->converted to sinus. No date: RLS (restless legs syndrome) 08/30/2019: S/P minimally invasive mitral valve repair     Comment:  a. 08/2019 s/p Complex valvuloplasty including artificial              Goretex neochord placement x12 with plication of               posterior leaflet and 40 mm Sorin Memo 4D ring               annuloplasty via right mini thoracotomy approach. No date: UTI (urinary tract infection)  Past Surgical History: No date: ABDOMINAL HYSTERECTOMY     Comment:  tah/bso No date: APPENDECTOMY 08/09/2019: BUBBLE STUDY     Comment:  Procedure: BUBBLE STUDY;  Surgeon: Sande Rives, MD;  Location: [facility] ENDOSCOPY;  Service:               Cardiology;; No date: CHOLECYSTECTOMY No date: KNEE SURGERY No date: LAPAROSCOPY 08/30/2019: MITRAL VALVE REPAIR; Right     Comment:  Procedure: MINIMALLY INVASIVE MITRAL VALVE REPAIR (MVR)               USING MEMO 4D SIZE 40;  Surgeon: Purcell Nails, MD;                Location: [facility] OR;  Service: Open Heart Surgery;                Laterality: Right; 08/09/2019: RIGHT/LEFT HEART CATH AND CORONARY ANGIOGRAPHY; N/A     Comment:  Procedure: RIGHT/LEFT HEART CATH AND CORONARY               ANGIOGRAPHY;  Surgeon: Yvonne Kendall, MD;  Location:               [facility] INVASIVE CV LAB;  Service: Cardiovascular;                Laterality: N/A; 08/09/2019: TEE WITHOUT CARDIOVERSION; N/A     Comment:  Procedure: TRANSESOPHAGEAL ECHOCARDIOGRAM (TEE);                Surgeon: Sande Rives, MD;  Location: [facility]               ENDOSCOPY;  Service: Cardiology;  Laterality: N/A; 08/30/2019: TEE WITHOUT CARDIOVERSION; N/A     Comment:  Procedure: TRANSESOPHAGEAL ECHOCARDIOGRAM (TEE);                Surgeon: Purcell Nails, MD;  Location: [facility] OR;                Service: Open Heart Surgery;  Laterality: N/A; No date:  US ECHOCARDIOGRAPHY     Comment:  EF  55-60%     Reproductive/Obstetrics negative OB ROS                             Anesthesia Physical Anesthesia Plan  ASA: 3  Anesthesia Plan: General   Post-op Pain Management: Minimal or no pain anticipated   Induction: Intravenous  PONV Risk Score and Plan: Propofol infusion and TIVA  Airway Management Planned: Natural Airway  Additional Equipment:   Intra-op Plan:   Post-operative Plan:   Informed Consent: I have reviewed the patients History and Physical, chart, labs and discussed the procedure including the risks, benefits and alternatives for the proposed anesthesia with the patient or authorized representative who has indicated his/her understanding and acceptance.     Dental Advisory Given  Plan Discussed with: CRNA and Surgeon  Anesthesia Plan Comments:         Anesthesia Quick Evaluation

## 2023-06-22 NOTE — Anesthesia Postprocedure Evaluation (Signed)
Anesthesia Post Note  Patient: Bonnie Ryan  Procedure(s) Performed: COLONOSCOPY WITH PROPOFOL POLYPECTOMY  Patient location during evaluation: Endoscopy Anesthesia Type: General Level of consciousness: awake and alert Pain management: pain level controlled Vital Signs Assessment: post-procedure vital signs reviewed and stable Respiratory status: spontaneous breathing, nonlabored ventilation and respiratory function stable Cardiovascular status: blood pressure returned to baseline and stable Postop Assessment: no apparent nausea or vomiting Anesthetic complications: no   No notable events documented.   Last Vitals:  Vitals:   06/22/23 1308 06/22/23 1328  BP: (!) 81/49 101/64  Pulse: 73   Resp: 13   Temp: (!) 36.3 C   SpO2: 100%     Last Pain:  Vitals:   06/22/23 1328  TempSrc:   PainSc: 0-No pain                 [name]

## 2023-06-22 NOTE — H&P (Signed)
Outpatient short stay form Pre-procedure 06/22/2023 12:23 PM Manila Rommel K. Norma Fredrickson, M.D.  Primary Physician: Enid Baas, M.D.  Reason for visit:  Personal history of colon polyps   Ms. Saraceni reports having a chronic history of constipation, she generally has a bowel movement about once a week after taking Ex-Lax. She reports that if she does not take Ex-Lax she generally does not have a bowel movement. She denies any rectal bleeding or melena. Feels she has always had constipation but may be slightly worse recently. She denies any rectal bleeding or melena. He has some mild abdominal pain when has multiple days between stools but overall feels symptoms are fairly minimal.  She denies any GERD, nausea, vomiting, epigastric pain, dysphagia.  History of present illness:      Current Facility-Administered Medications:    0.9 %  sodium chloride infusion, , Intravenous, Continuous, Amaranta Mehl, Boykin Nearing, MD, Last Rate: 20 mL/hr at 06/22/23 1216, New Bag at 06/22/23 1216  Medications Prior to Admission  Medication Sig Dispense Refill Last Dose   losartan (COZAAR) 25 MG tablet TAKE 1 TABLET BY MOUTH EVERY DAY 90 tablet 0 06/22/2023 at 0600   metoprolol succinate (TOPROL-XL) 25 MG 24 hr tablet Take 1 tablet (25 mg total) by mouth daily. Take with or immediately following a meal. 90 tablet 3 06/22/2023 at 0600   MOUNJARO 12.5 MG/0.5ML Pen Inject 12.5 mg into the skin once a week.   06/13/2023   fenofibrate (TRICOR) 145 MG tablet Take 145 mg by mouth daily.      furosemide (LASIX) 40 MG tablet Take 0.5 tablet (20 mg) by mouth once daily as needed 90 tablet 1    Insulin Pen Needle (PEN NEEDLES) 31G X 6 MM MISC Use to inject Victoza (Patient not taking: Reported on 02/25/2023) 100 each 3    nitroGLYCERIN (NITROSTAT) 0.4 MG SL tablet Place 1 tablet (0.4 mg total) under the tongue every 5 (five) minutes as needed for chest pain. 25 tablet 3    ONETOUCH VERIO test strip USE AS INSTRUCTED USE TO TEST BLOOD  GLUCOSE DAILY. DX E11.9 100 strip 3    potassium chloride SA (KLOR-CON M20) 20 MEQ tablet TAKE 1 TABLET BY MOUTH EVERY DAY 90 tablet 2    rosuvastatin (CRESTOR) 20 MG tablet Take 20 mg by mouth daily.        Allergies  Allergen Reactions   Penicillins Other (See Comments)    Convulsions  Did it involve swelling of the face/tongue/throat, SOB, or low BP? No Did it involve sudden or severe rash/hives, skin peeling, or any reaction on the inside of your mouth or nose? No Did you need to seek medical attention at a hospital or doctor's office? No When did it last happen?      Childhood allergy If all above answers are "NO", may proceed with cephalosporin use.      Past Medical History:  Diagnosis Date   Chronic kidney disease    Diabetes mellitus    Previously diet-controlled   Endometriosis    Family history of adverse reaction to anesthesia    mother used to have vomiting after    Hepatic steatosis 04/15/2020   Hypercholesteremia    Hypertension    Insomnia 06/03/2020   Mitral Valve Prolapse w/ Mitral regurgitation    a. 10/2009 Echo: Mod MVP, mild MR; b. 07/2019 Echo: EF 60-65%, Gr2 DD, Mod MR; c. 07/2019 TEE: EF 60-65%. No rwma. Mod dil LA. Sev MR w/ sev bileaflet prolapse. Mild TR;  d. 08/2019 s/p minimally invasive MV repair.   Non-obstructive CAD (coronary artery disease)    a. 09/2006 Cath: LAD 30, RCA 25; b.07/2011 Cath: LM Ca2+, 30-40mLAD, RCA 30-40p, EF 55-65%, 2+ MR; c. 07/2019 Cath: LM 43m/d, LAD 29m, RI nl, LCX 66m, RCA 45p, 69m.   Post-op atrial fibrillation (HCC)    a. 08/2019 following MV repair-->Amio/warfarin initiated.  Initially prolonged QT w/ IV amio-->converted to sinus.   RLS (restless legs syndrome)    S/P minimally invasive mitral valve repair 08/30/2019   a. 08/2019 s/p Complex valvuloplasty including artificial Goretex neochord placement x12 with plication of posterior leaflet and 40 mm Sorin Memo 4D ring annuloplasty via right mini thoracotomy approach.    UTI (urinary tract infection)     Review of systems:  Otherwise negative.    Physical Exam  Gen: Alert, oriented. Appears stated age.  HEENT: [place]/AT. PERRLA. Lungs: CTA, no wheezes. CV: RR nl S1, S2. Abd: soft, benign, no masses. BS+ Ext: No edema. Pulses 2+    Planned procedures: Proceed with colonoscopy. The patient understands the nature of the planned procedure, indications, risks, alternatives and potential complications including but not limited to bleeding, infection, perforation, damage to internal organs and possible oversedation/side effects from anesthesia. The patient agrees and gives consent to proceed.  Please refer to procedure notes for findings, recommendations and patient disposition/instructions.     Nyeema Want K. Norma Fredrickson, M.D. Gastroenterology 06/22/2023  12:23 PM

## 2023-06-22 NOTE — Transfer of Care (Signed)
Immediate Anesthesia Transfer of Care Note  Patient: Bonnie Ryan  Procedure(s) Performed: COLONOSCOPY WITH PROPOFOL POLYPECTOMY  Patient Location: PACU and Endoscopy Unit  Anesthesia Type:General  Level of Consciousness: awake  Airway & Oxygen Therapy: Patient Spontanous Breathing  Post-op Assessment: Report given to RN and Post -op Vital signs reviewed and stable  Post vital signs: Reviewed and stable  Last Vitals:  Vitals Value Taken Time  BP 81/49 06/22/23 1309  Temp    Pulse 72 06/22/23 1308  Resp 19 06/22/23 1309  SpO2 100 % 06/22/23 1308  Vitals shown include unfiled device data.  Last Pain:  Vitals:   06/22/23 1155  TempSrc: Temporal  PainSc: 0-No pain         Complications: No notable events documented.

## 2023-06-22 NOTE — Op Note (Signed)
[facility] Gastroenterology Patient Name: Bonnie Ryan Procedure Date: 06/22/2023 12:41 PM MRN: [id number] Account #: [id number] Date of Birth: Oct 23, 1955 Admit Type: Outpatient Age: 67 Room: [facility] ENDO ROOM 2 Gender: Female Note Status: Finalized Instrument Name: Nelda Marseille [id number] Procedure:             Colonoscopy Indications:           Surveillance: Personal history of colonic polyps                         (unknown histology) on last colonoscopy more than 5                         years ago Providers:             Royce Macadamia K. Norma Fredrickson MD, MD Referring MD:          Enid Baas, MD (Referring MD) Medicines:             Propofol per Anesthesia Complications:         No immediate complications. Estimated blood loss: None. Procedure:             Pre-Anesthesia Assessment:                        - The risks and benefits of the procedure and the                         sedation options and risks were discussed with the                         patient. All questions were answered and informed                         consent was obtained.                        - Patient identification and proposed procedure were                         verified prior to the procedure by the nurse. The                         procedure was verified in the procedure room.                        - ASA Grade Assessment: III - A patient with severe                         systemic disease.                        - After reviewing the risks and benefits, the patient                         was deemed in satisfactory condition to undergo the                         procedure.  After obtaining informed consent, the colonoscope was                         passed under direct vision. Throughout the procedure,                         the patient's blood pressure, pulse, and oxygen                         saturations were monitored continuously. The                          Colonoscope was introduced through the anus and                         advanced to the the cecum, identified by appendiceal                         orifice and ileocecal valve. The colonoscopy was                         performed without difficulty. The patient tolerated                         the procedure well. The quality of the bowel                         preparation was good. The ileocecal valve, appendiceal                         orifice, and rectum were photographed. Findings:      The perianal and digital rectal examinations were normal. Pertinent       negatives include normal sphincter tone and no palpable rectal lesions.      Non-bleeding internal hemorrhoids were found during retroflexion. The       hemorrhoids were Grade I (internal hemorrhoids that do not prolapse).      Many medium-mouthed and small-mouthed diverticula were found in the       sigmoid colon.      Two sessile polyps were found in the rectum. The polyps were 3 to 4 mm       in size. These polyps were removed with a cold snare. Resection and       retrieval were complete.      A 10 mm polyp was found in the descending colon. The polyp was sessile.       Area was successfully injected with 2 mL saline for a lift polypectomy.       Estimated blood loss: none. The polyp was removed with a cold snare.       Resection and retrieval were complete.      The exam was otherwise without abnormality. Impression:            - Non-bleeding internal hemorrhoids.                        - Diverticulosis in the sigmoid colon.                        - Two 3 to 4 mm polyps in the rectum, removed  with a                         cold snare. Resected and retrieved.                        - One 10 mm polyp in the descending colon, removed                         with a cold snare. Resected and retrieved. Injected.                        - The examination was otherwise normal. Recommendation:        - Patient has a  contact number available for                         emergencies. The signs and symptoms of potential                         delayed complications were discussed with the patient.                         Return to normal activities tomorrow. Written                         discharge instructions were provided to the patient.                        - Resume previous diet.                        - Continue present medications.                        - Repeat colonoscopy is recommended for surveillance.                         The colonoscopy date will be determined after                         pathology results from today's exam become available                         for review.                        - Return to GI office PRN.                        - The findings and recommendations were discussed with                         the patient. Procedure Code(s):     --- Professional ---                        [phone], Colonoscopy, flexible; with removal of                         tumor(s), polyp(s), or other lesion(s) by snare  technique                        [id number], Colonoscopy, flexible; with directed submucosal                         injection(s), any substance Diagnosis Code(s):     --- Professional ---                        K57.30, Diverticulosis of large intestine without                         perforation or abscess without bleeding                        D12.4, Benign neoplasm of descending colon                        D12.8, Benign neoplasm of rectum                        K64.0, First degree hemorrhoids                        Z86.010, Personal history of colonic polyps CPT copyright 2022 [employer]. All rights reserved. The codes documented in this report are preliminary and upon coder review may  be revised to meet current compliance requirements. Stanton Kidney MD, MD 06/22/2023 1:08:52 PM This report has been signed  electronically. Number of Addenda: 0 Note Initiated On: 06/22/2023 12:41 PM Scope Withdrawal Time: 0 hours 7 minutes 47 seconds  Total Procedure Duration: 0 hours 13 minutes 32 seconds  Estimated Blood Loss:  Estimated blood loss: none.      [facility]

## 2023-06-23 ENCOUNTER — Encounter: Payer: Self-pay | Admitting: Internal Medicine

## 2023-06-23 LAB — SURGICAL PATHOLOGY

## 2024-01-08 ENCOUNTER — Other Ambulatory Visit: Payer: Self-pay

## 2024-01-08 ENCOUNTER — Emergency Department

## 2024-01-08 ENCOUNTER — Emergency Department
Admission: EM | Admit: 2024-01-08 | Discharge: 2024-01-08 | Disposition: A | Discharge status: Home / Self Care | Attending: Emergency Medicine | Admitting: Emergency Medicine

## 2024-01-08 DIAGNOSIS — I110 Hypertensive heart disease with heart failure: Secondary | ICD-10-CM | POA: Diagnosis not present

## 2024-01-08 DIAGNOSIS — S0990XA Unspecified injury of head, initial encounter: Secondary | ICD-10-CM | POA: Diagnosis present

## 2024-01-08 DIAGNOSIS — W1809XA Striking against other object with subsequent fall, initial encounter: Secondary | ICD-10-CM | POA: Diagnosis not present

## 2024-01-08 DIAGNOSIS — S0101XA Laceration without foreign body of scalp, initial encounter: Secondary | ICD-10-CM | POA: Insufficient documentation

## 2024-01-08 DIAGNOSIS — I509 Heart failure, unspecified: Secondary | ICD-10-CM | POA: Diagnosis not present

## 2024-01-08 DIAGNOSIS — R55 Syncope and collapse: Secondary | ICD-10-CM | POA: Insufficient documentation

## 2024-01-08 LAB — CBC
HCT: 34.7 % — ABNORMAL LOW (ref 36.0–46.0)
Hemoglobin: 11.6 g/dL — ABNORMAL LOW (ref 12.0–15.0)
MCH: 31 pg (ref 26.0–34.0)
MCHC: 33.4 g/dL (ref 30.0–36.0)
MCV: 92.8 fL (ref 80.0–100.0)
Platelets: 139 10*3/uL — ABNORMAL LOW (ref 150–400)
RBC: 3.74 MIL/uL — ABNORMAL LOW (ref 3.87–5.11)
RDW: 13.7 % (ref 11.5–15.5)
WBC: 6.1 10*3/uL (ref 4.0–10.5)
nRBC: 0 % (ref 0.0–0.2)

## 2024-01-08 LAB — URINALYSIS, ROUTINE W REFLEX MICROSCOPIC
Bilirubin Urine: NEGATIVE
Glucose, UA: NEGATIVE mg/dL
Hgb urine dipstick: NEGATIVE
Ketones, ur: NEGATIVE mg/dL
Nitrite: NEGATIVE
Protein, ur: NEGATIVE mg/dL
Specific Gravity, Urine: 1.008 (ref 1.005–1.030)
WBC, UA: >50 WBC/hpf (ref 0–5)
pH: 6 (ref 5.0–8.0)

## 2024-01-08 LAB — COMPREHENSIVE METABOLIC PANEL WITH GFR
ALT: 20 U/L (ref 0–44)
AST: 30 U/L (ref 15–41)
Albumin: 4.5 g/dL (ref 3.5–5.0)
Alkaline Phosphatase: 35 U/L — ABNORMAL LOW (ref 38–126)
Anion gap: 10 (ref 5–15)
BUN: 39 mg/dL — ABNORMAL HIGH (ref 8–23)
CO2: 25 mmol/L (ref 22–32)
Calcium: 8.9 mg/dL (ref 8.9–10.3)
Chloride: 102 mmol/L (ref 98–111)
Creatinine, Ser: 1.9 mg/dL — ABNORMAL HIGH (ref 0.44–1.00)
GFR, Estimated: 29 mL/min — ABNORMAL LOW (ref >60)
Glucose, Bld: 80 mg/dL (ref 70–99)
Potassium: 3.7 mmol/L (ref 3.5–5.1)
Sodium: 137 mmol/L (ref 135–145)
Total Bilirubin: 0.9 mg/dL (ref 0.0–1.2)
Total Protein: 7.1 g/dL (ref 6.5–8.1)

## 2024-01-08 LAB — D-DIMER, QUANTITATIVE: D-Dimer, Quant: 0.31 ug{FEU}/mL (ref 0.00–0.50)

## 2024-01-08 LAB — TROPONIN I (HIGH SENSITIVITY): Troponin I (High Sensitivity): 5 ng/L (ref <18)

## 2024-01-08 MED ORDER — LIDOCAINE HCL (PF) 1 % IJ SOLN
5.0000 mL | Freq: Once | INTRAMUSCULAR | Status: AC
  Administered 2024-01-08: 5 mL
  Filled 2024-01-08: qty 5

## 2024-01-08 MED ORDER — SODIUM CHLORIDE 0.9 % IV BOLUS
1000.0000 mL | Freq: Once | INTRAVENOUS | Status: AC
  Administered 2024-01-08: 1000 mL via INTRAVENOUS

## 2024-01-08 MED ORDER — CIPROFLOXACIN HCL 500 MG PO TABS
500.0000 mg | ORAL_TABLET | ORAL | Status: AC
  Administered 2024-01-08: 500 mg via ORAL
  Filled 2024-01-08: qty 1

## 2024-01-08 MED ORDER — CIPROFLOXACIN HCL 500 MG PO TABS: 500.0000 mg | ORAL_TABLET | Freq: Every day | ORAL | 0 refills | Status: DC

## 2024-01-08 NOTE — ED Provider Notes (Signed)
 Patient has had been hydrated, doing well.  Review of labs some suspicion for potential urinary tract infection.  Reports severe energy penicillin.  Will start on ciprofloxacin .  Discussed side effects, including potential risk to ligamentous injury tendinous injuries, etc.  Patient agreeable.  Return precautions and treatment recommendations and follow-up discussed with the patient who is agreeable with the plan.    Iver Marker, MD 01/09/24 0040

## 2024-01-08 NOTE — ED Provider Notes (Signed)
 [facility] Provider Note    Event Date/Time   First MD Initiated Contact with Patient 01/08/24 1342     (approximate)   History   Loss of Consciousness   HPI  Bonnie Ryan is a 68 y.o. female past medical history significant for heart failure, paroxysmal atrial fibrillation and mitral valve prolapse status post repair procedure not on anticoagulation, hypertension, presents to the emergency department following an episode of loss of consciousness.  Patient states that she has had multiple episodes of feeling dizzy and passing out that has been ongoing and states that she has had a significant workup as an outpatient.  States that yesterday she was walking to the bathroom and then passed out.  Does not recall the events and states she did not feel dizzy prior to her fall.  States that she hit her back of her head and had a laceration.  Today had ongoing mild confusion and some dizziness, was told by her son who is an EMT that she needed to come into the emergency department for further evaluation.  Denies any new medications or change in medications.  Not on anticoagulation.  Denies chest pain or shortness of breath.  Denies dysuria, urinary urgency or frequency.  Tetanus is up-to-date.  Patient states that she often times does not eat very much and sometimes she will only eat 1000 cal in a week.  Does endorse significant amount of water use.     Physical Exam   Triage Vital Signs: ED Triage Vitals  Encounter Vitals Group     BP 01/08/24 1347 136/86     Systolic BP Percentile --      Diastolic BP Percentile --      Pulse Rate 01/08/24 1347 (!) 105     Resp 01/08/24 1347 18     Temp 01/08/24 1347 97.8 F (36.6 C)     Temp Source 01/08/24 1347 Oral     SpO2 01/08/24 1347 100 %     Weight 01/08/24 1344 138 lb (62.6 kg)     Height 01/08/24 1344 5\' 8"  (1.727 m)     Head Circumference --      Peak Flow --      Pain Score 01/08/24 1343 4     Pain Loc --       Pain Education --      Exclude from Growth Chart --     Most recent vital signs: Vitals:   01/08/24 1347 01/08/24 1445  BP: 136/86 128/81  Pulse: (!) 105 (!) 103  Resp: 18 11  Temp: 97.8 F (36.6 C)   SpO2: 100% 99%    Physical Exam Constitutional:      Appearance: She is well-developed.  HENT:     Head:     Comments: 2 cm laceration to the occipital scalp that is hemostatic Eyes:     Conjunctiva/sclera: Conjunctivae normal.  Cardiovascular:     Rate and Rhythm: Regular rhythm. Tachycardia present.  Pulmonary:     Effort: No respiratory distress.  Abdominal:     General: There is no distension.  Musculoskeletal:        General: Normal range of motion.     Cervical back: Normal range of motion.  Skin:    General: Skin is warm.  Neurological:     Mental Status: She is alert. Mental status is at baseline.     IMPRESSION / MDM / ASSESSMENT AND PLAN / ED COURSE  I reviewed the triage  vital signs and the nursing notes.  Differential diagnosis including intracranial hemorrhage, postconcussive syndrome, orthostatic hypotension, ACS, anemia, dehydration  EKG  I, Viviano Ground, the attending physician, personally viewed and interpreted this ECG.  EKG showed sinus tachycardia with a heart rate of 105.  QTc mildly prolonged at 492.  No significant ST elevation.  Mild ST depression isolated to aVF.  No tachycardic or bradycardic dysrhythmias while on cardiac telemetry.  RADIOLOGY I independently reviewed imaging, my interpretation of imaging: CT scan of the head with no signs of intracranial hemorrhage.  CT scan of the cervical spine was read as no acute findings.  LABS (all labs ordered are listed, but only abnormal results are displayed) Labs interpreted as -    Labs Reviewed  COMPREHENSIVE METABOLIC PANEL WITH GFR - Abnormal; Notable for the following components:      Result Value   BUN 39 (*)    Creatinine, Ser 1.90 (*)    Alkaline Phosphatase 35 (*)    GFR,  Estimated 29 (*)    All other components within normal limits  CBC - Abnormal; Notable for the following components:   RBC 3.74 (*)    Hemoglobin 11.6 (*)    HCT 34.7 (*)    Platelets 139 (*)    All other components within normal limits  URINALYSIS, ROUTINE W REFLEX MICROSCOPIC - Abnormal; Notable for the following components:   Color, Urine YELLOW (*)    APPearance HAZY (*)    Leukocytes,Ua LARGE (*)    Bacteria, UA RARE (*)    All other components within normal limits  URINE CULTURE  D-DIMER, QUANTITATIVE  CBG MONITORING, ED  TROPONIN I (HIGH SENSITIVITY)     MDM    No significant leukocytosis.  Patient has anemia but no signs of acute blood loss.  Hemoglobin is stable at 11.6.  Low platelets at 139 which appears to be on prior outside labs.  Creatinine elevated at 1.9 mildly elevated from her baseline of 1.7 on outside labs.  Large leukocytes and WBCs but rare bacteria.  No signs or symptoms concerning for urinary tract infection at this time.  Will add on a urine culture.  Orthostatic blood pressures are negative.  Low suspicion for ACS and troponin is negative.  Given her tachycardia and syncopal episode, does endorse some mild shortness of breath we will add on a D-dimer.  Laceration was repaired at bedside.  Able to ambulate in the emergency department without any difficulties.  Care transferred to incoming provider with D-dimer currently pending.  If D-dimer is negative plan to discharge home with outpatient follow-up.   PROCEDURES:  Critical Care performed: No  .Laceration Repair  Date/Time: 01/08/2024 4:03 PM  Performed by: Viviano Ground, MD Authorized by: Viviano Ground, MD   Consent:    Consent obtained:  Verbal   Consent given by:  Patient   Risks, benefits, and alternatives were discussed: yes     Risks discussed:  Pain, nerve damage, poor wound healing, poor cosmetic result, vascular damage, tendon damage, infection and need for additional repair    Alternatives discussed:  Delayed treatment and no treatment Universal protocol:    Procedure explained and questions answered to patient or proxy's satisfaction: yes     Relevant documents present and verified: yes     Test results available: yes     Imaging studies available: yes     Required blood products, implants, devices, and special equipment available: yes     Patient identity confirmed:  Verbally with patient Anesthesia:    Anesthesia method:  Local infiltration   Local anesthetic:  Lidocaine  1% w/o epi Laceration details:    Length (cm):  3 Pre-procedure details:    Preparation:  Patient was prepped and draped in usual sterile fashion Treatment:    Amount of cleaning:  Standard Skin repair:    Repair method:  Staples   Number of staples:  4 Approximation:    Approximation:  Close Repair type:    Repair type:  Simple Post-procedure details:    Dressing:  Sterile dressing   Procedure completion:  Tolerated well, no immediate complications   Patient's presentation is most consistent with acute presentation with potential threat to life or bodily function.   MEDICATIONS ORDERED IN ED: Medications  lidocaine  (PF) (XYLOCAINE ) 1 % injection 5 mL (5 mLs Other Given 01/08/24 1418)  sodium chloride  0.9 % bolus 1,000 mL (1,000 mLs Intravenous New Bag/Given 01/08/24 1503)    FINAL CLINICAL IMPRESSION(S) / ED DIAGNOSES   Final diagnoses:  Syncope and collapse  Laceration of scalp, initial encounter     Rx / DC Orders   ED Discharge Orders     None        Note:  This document was prepared using Dragon voice recognition software and may include unintentional dictation errors.   Viviano Ground, MD 01/08/24 [id number]

## 2024-01-08 NOTE — ED Triage Notes (Addendum)
 Pt sent from [facility] clinic for 2 episodes of unwitnessed episodes of syncope, pt states she normally is dizzy but endorses she didn't feel that yesterday but "just fell out'. Pt has HX of valve replacement. Pt did notice blood to back of her bed. Pt denies blood thinner use. NAD noted. Pt does have a lac to the back of her head.

## 2024-01-10 LAB — URINE CULTURE: Culture: >=100000 — AB

## 2024-03-16 ENCOUNTER — Other Ambulatory Visit: Payer: Self-pay | Admitting: Internal Medicine

## 2024-03-16 DIAGNOSIS — R55 Syncope and collapse: Secondary | ICD-10-CM

## 2024-03-19 ENCOUNTER — Ambulatory Visit
Admission: RE | Admit: 2024-03-19 | Discharge: 2024-03-19 | Disposition: A | Discharge status: Home / Self Care | Source: Ambulatory Visit | Attending: Internal Medicine | Admitting: Internal Medicine

## 2024-03-19 DIAGNOSIS — R55 Syncope and collapse: Secondary | ICD-10-CM | POA: Insufficient documentation

## 2024-03-19 MED ORDER — GADOBUTROL 1 MMOL/ML IV SOLN
6.0000 mL | Freq: Once | INTRAVENOUS | Status: AC | PRN
  Administered 2024-03-19: 6 mL via INTRAVENOUS

## 2024-03-20 ENCOUNTER — Other Ambulatory Visit

## 2024-03-22 ENCOUNTER — Ambulatory Visit

## 2024-03-22 ENCOUNTER — Encounter: Payer: Self-pay | Admitting: Medical

## 2024-03-22 ENCOUNTER — Ambulatory Visit: Attending: Medical | Admitting: Medical

## 2024-03-22 VITALS — BP 92/66 | HR 106 | Ht 67.0 in | Wt 160.0 lb

## 2024-03-22 DIAGNOSIS — Z8679 Personal history of other diseases of the circulatory system: Secondary | ICD-10-CM | POA: Diagnosis not present

## 2024-03-22 DIAGNOSIS — I25118 Atherosclerotic heart disease of native coronary artery with other forms of angina pectoris: Secondary | ICD-10-CM | POA: Diagnosis not present

## 2024-03-22 DIAGNOSIS — R55 Syncope and collapse: Secondary | ICD-10-CM | POA: Diagnosis not present

## 2024-03-22 DIAGNOSIS — I5022 Chronic systolic (congestive) heart failure: Secondary | ICD-10-CM | POA: Diagnosis not present

## 2024-03-22 DIAGNOSIS — I471 Supraventricular tachycardia, unspecified: Secondary | ICD-10-CM

## 2024-03-22 DIAGNOSIS — I4719 Other supraventricular tachycardia: Secondary | ICD-10-CM

## 2024-03-22 MED ORDER — METOPROLOL SUCCINATE ER 25 MG PO TB24: 25.0000 mg | ORAL_TABLET | Freq: Two times a day (BID) | ORAL | 3 refills | Status: AC

## 2024-03-22 NOTE — Progress Notes (Unsigned)
 Cardiology Office Note   Date:  03/23/2024  ID:  Bonnie Ryan, Bonnie Ryan 1956/02/14, MRN [id number] PCP: Sherial Bail, MD  [facility] Providers Cardiologist:  Lonni Hanson, MD Electrophysiologist:  OLE ONEIDA HOLTS, MD     History of Present Illness Bonnie Ryan is a 68 y.o. 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   Mitral valve prolapse diagnosed >10 years ago. Prior evaluation with normal LVEf, moderate MR, and nonobstructive CAD by cath 2012. Established with Dr. Hanson early 2020 noting decline in functional capacity. Echocardiogram 06/2019 with normal LVEF and moderate to severe MR. TEE with severe bileaflet MVP and severe MR. Diagnostic catheterization with nonobstructive CAD. Referred to CT surgery and underwent successful minimally invasive mitral valve repair on August 30, 2019. Postoperatively she had atrial fibrillation treated with IV amiodarone . Converted to SR and amiodarone  was discontinued due to prolonged QT. After discussion with EP, started Amiodarone  200mg  daily. Follow up with A. Fib clinic 09/11/19 she was in atrial fib with fatigue. Amiodarone  increased to 200mg  BID and then reduced to daily one week later as she was in SR with QTc 536. On 09/27/19 her Amiodarone  was reduced to 100mg  daily due to QTc 520. She was in the office 10/19/19 for echocardiogram 10/19/2019 and noted to be symptomatic by the technician.  As such, she was transitioned into an office visit and noted worsening she is shortness of breath and dyspnea on exertion and lightheadedness within the last several weeks.  Orthostatic blood pressures were normal.  Her losartan  was reduced due to low normal blood pressure with systolic 100-110.   Seen by Dr. Hanson on 10/24/19. Her echo was read as 30 to 35% on his personal review of images  estimated closer to 45%.  Noted to not be unexpected have some decline in LVEF following treatment of severe MR.  Her furosemide  was increased from 20 to 40 mg daily based on weight and edema was continued as well as her losartan  25 mg daily.  Her metoprolol  tartrate was transitioned to metoprolol  succinate.  Her amiodarone  was discontinued as it was concerning to potentially be contributing to her symptoms. Seen in follow up and she was maintaining NSR. Echo March 2022 showed normal LVEF, G2DD and normally functioning mitral valve.  Patient saw EP 04/28/2022 reporting lightheadedness and dizziness, suspected related to dysautonomia.  She was continued on medical therapy.   She was last seen 02/2023 reporting persistent dizziness.  BP was low and Toprol  was decreased.  Echocardiogram was ordered. Echo 02/2023 showed EF of 55 to 60%, mild LVH, grade 1 diastolic dysfunction, normally functioning mitral valve with mild regurg.  The patient went to the ER 01/08/24 with LOC. Labs showed Scr 1.7, BUN 21, hemoglobin stable at 11.6, platelets 139 which was stable.  Orthostatics were negative.  D-dimer negative.  EKG showed suspected atrial tachycardia with a HR of 105bpm. Patient was given IV fluids.  Suspicion for UTI and she was started on Cipro .  Today, the patient reports 3 instances of passing out. She said she fell/lost consciousness suddenly. She did sustain head injury on the last fall.  She reports generalized fatigue.  No chest pain or shortness of breath.  Today, orthostatics negative.  EKG appears to show atrial tachycardia with a heart rate of 105 bpm.  This is similar to  EKG in May with the same heart rate.  Studies Reviewed EKG Interpretation Date/Time:  Thursday March 22 2024 14:15:37 EDT Ventricular Rate:  106 PR Interval:  222 QRS Duration:  90 QT Interval:  334 QTC Calculation: 443 R Axis:   27  Text Interpretation: Atrial tachycardia with 1st degree A-V block Low voltage QRS Nonspecific ST  and T wave abnormality When compared with ECG of 08-Jan-2024 13:47, PREVIOUS ECG IS PRESENT Confirmed by Franchester, Gerica Koble ([id number]) on 03/23/2024 8:29:21 AM    Echo 2024  1. Left ventricular ejection fraction, by estimation, is 55 to 60%. The  left ventricle has normal function. The left ventricle has no regional  wall motion abnormalities. There is mild left ventricular hypertrophy.  Left ventricular diastolic parameters  are consistent with Grade I diastolic dysfunction (impaired relaxation).   2. Right ventricular systolic function is normal. The right ventricular  size is normal. There is normal pulmonary artery systolic pressure. The  estimated right ventricular systolic pressure is 22.6 mmHg.   3. Left atrial size was mildly dilated.   4. The mitral valve has been repaired/replaced. Mild mitral valve  regurgitation. No evidence of mitral stenosis.   5. The aortic valve is tricuspid. Aortic valve regurgitation is not  visualized. No aortic stenosis is present.   6. The inferior vena cava is normal in size with greater than 50%  respiratory variability, suggesting right atrial pressure of 3 mmHg.   Echo 2023 1. Left ventricular ejection fraction, by estimation, is 55 to 60%. The  left ventricle has normal function. The left ventricle has no regional  wall motion abnormalities. There is mild left ventricular hypertrophy.  Left ventricular diastolic parameters  are consistent with Grade I diastolic dysfunction (impaired relaxation).   2. Right ventricular systolic function is normal. The right ventricular  size is normal. There is normal pulmonary artery systolic pressure. The  estimated right ventricular systolic pressure is 24.9 mmHg.   3. Left atrial size was moderately dilated.   4. The mitral valve is normal in structure. No evidence of mitral valve  regurgitation. No evidence of mitral stenosis.   5. The aortic valve is tricuspid. Aortic valve regurgitation is not  visualized.  Aortic valve sclerosis is present, with no evidence of aortic  valve stenosis.   6. There is borderline dilatation of the ascending aorta, measuring 37  mm.   7. The inferior vena cava is normal in size with greater than 50%  respiratory variability, suggesting right atrial pressure of 3 mmHg.    Echo 10/2020  1. Left ventricular ejection fraction, by estimation, is 55 to 60%. The  left ventricle has normal function. The left ventricle has no regional  wall motion abnormalities. Left ventricular diastolic parameters are  consistent with Grade II diastolic  dysfunction (pseudonormalization).   2. Right ventricular systolic function is normal. The right ventricular  size is normal.   3. Left atrial size was mildly dilated.   4. The mitral valve has been repaired/replaced. No evidence of mitral  valve regurgitation. There is a prosthetic annuloplasty ring present in  the mitral position. Procedure Date: 08/2019.   5. The aortic valve is tricuspid. Aortic valve regurgitation is not  visualized. Mild aortic valve sclerosis is present, with no evidence of  aortic valve stenosis.   6. The inferior vena cava is dilated in size with >50% respiratory  variability, suggesting right atrial pressure of 8 mmHg.    Heart monitor 10/2020 The patient was monitored for 14  days. The predominant rhythm was sinus with an average rate of 84 bpm (range 67-111 bpm in sinus). There were rare PAC's and occasional PVC's (burden 1.8%). There were 5 episodes of nonsustained ventricular tachycardia lasting up to 5 beats with a maximum rate of 184 bpm. 178 atrial runs occurred, lasting up to 48:20 minutes with a maximum rate of 122 bpm. No prolong pause or atrial fibrillation was observed. Patient triggered events correspond to sinus rhythm and PVC's.   Predominantly sinus rhythm with multiple runs of SVT (lasting up to 48 minutes), as well as occasional PVC's, rare PAC's, and rare episodes of NSVT.   Echo  09/2019  1. Left ventricular ejection fraction, by estimation, is 30 to 35%. The  left ventricle has moderately decreased function. The left ventricle  demonstrates global hypokinesis, unable to exclude regional wall motion  abnormality. There is mild left  ventricular hypertrophy. Left ventricular diastolic parameters are  consistent with Grade I diastolic dysfunction (impaired relaxation).   2. Right ventricular systolic function is normal. The right ventricular  size is normal. There is normal pulmonary artery systolic pressure. The  estimated right ventricular systolic pressure is 28.8 mmHg.   3. s/p mitral valve annuloplasty 08/30/2019, The mitral valve is not well  visualized, No evidence of mitral valve regurgitation. No evidence of  mitral stenosis.   4. Challenging image quality, definity  used.   5. Compared to echo dated 08/03/2019, EF has decreased from 60%. mitral  valve regurgitation has resolved.    R/L heart cath 07/2019 Conclusions: Mild to moderate, non-obstructive coronary artery disease. Normal left and right heart filling pressure. Normal Fick cardiac output/index.   Recommendations: Decrease furosemide  to 20 mg daily as needed for edema/weight gain.  Additional potassium supplementation given prior to discharge home today. Proceed with cardiac surgery evaluation for mitral valve intervention. Medical therapy and risk factor modification to prevent progression of coronary artery disease.   Lonni Hanson, MD [facility]        Physical Exam VS:  BP 92/66 (BP Location: Left Arm, Patient Position: Sitting, Cuff Size: Normal)   Pulse (!) 106   Ht 5' 7 (1.702 m)   Wt 160 lb (72.6 kg)   LMP  (LMP Unknown)   SpO2 97%   BMI 25.06 kg/m   Orthostatic VS for the past 24 hrs (Last 3 readings):  BP- Lying Pulse- Lying BP- Sitting Pulse- Sitting BP- Standing at 0 minutes Pulse- Standing at 0 minutes BP- Standing at 3 minutes Pulse- Standing at 3 minutes  03/22/24  1416 90/64 106 (!) 86/60 106 (!) 78/60 104 (!) 80/60 106      Wt Readings from Last 3 Encounters:  03/22/24 160 lb (72.6 kg)  01/08/24 138 lb (62.6 kg)  06/22/23 157 lb (71.2 kg)    GEN: Well nourished, well developed in no acute distress NECK: No JVD; No carotid bruits CARDIAC: RR, tachycardia, no murmurs, rubs, gallops RESPIRATORY:  Clear to auscultation without rales, wheezing or rhonchi  ABDOMEN: Soft, non-tender, non-distended EXTREMITIES:  No edema; No deformity   ASSESSMENT AND PLAN  Atrial tachycardia Syncope Reports 3 syncopal episodes in the last 3 months with persistent dizziness.  EKG from ER visit in May and today appears to show atrial tachycardia with HR 105bpm. BP is low, but orthostatics are negative. I will check a Mag level. Stop losartan  and increase Toprol  to 25mg  BID. I will order a 2 week heart monitor and send to EP. Prior heart monitor showed 148 runs  of atrial tachcyardia, longest 48 minutes.  I will update an echocardiogram.  H/o pSVT and post-op Afib Patient was previously on warfarin and amiodarone .  No further episodes.  Continue Toprol .  Nonobstructive CAD Cardiac cath prior to mitral valve replacement showed moderate CAD.  She denies significant chest pain.  Continue Toprol  and Crestor .  HFrEF due to valvular disease with normalization of EF Echo in 2024 showed EF of 55 to 60%, grade 1 diastolic dysfunction, mild LVH, mild MR.  The patient is euvolemic on exam.  Continue Lasix , losartan , and Toprol .  Update echo as above.     Dispo: follow-up with EP in 1 month and general cards in 6 months  Signed, Chinedum Vanhouten VEAR Fishman, PA-C

## 2024-03-22 NOTE — Patient Instructions (Signed)
 Medication Instructions:  Your physician recommends the following medication changes.  STOP TAKING: Losartan    INCREASE: Metoprolol  to 25 mg by mouth twice a day   *If you need a refill on your cardiac medications before your next appointment, please call your pharmacy*  Lab Work: Your provider would like for you to have following labs drawn today Mg.     Testing/Procedures: Your physician has requested that you have an echocardiogram. Echocardiography is a painless test that uses sound waves to create images of your heart. It provides your doctor with information about the size and shape of your heart and how well your heart's chambers and valves are working.   You may receive an ultrasound enhancing agent through an IV if needed to better visualize your heart during the echo. This procedure takes approximately one hour.  There are no restrictions for this procedure.  This will take place at [address] [facility] [address], [place] [address]  Please note: We ask at that you not bring children with you during ultrasound (echo/ vascular) testing. Due to room size and safety concerns, children are not allowed in the ultrasound rooms during exams. Our front office staff cannot provide observation of children in our lobby area while testing is being conducted. An adult accompanying a patient to their appointment will only be allowed in the ultrasound room at the discretion of the ultrasound technician under special circumstances. We apologize for any inconvenience.   Your physician has recommended that you wear a 14 day Zio monitor.   This monitor is a medical device that records the heart's electrical activity. Doctors most often use these monitors to diagnose arrhythmias. Arrhythmias are problems with the speed or rhythm of the heartbeat. The monitor is a small device applied to your chest. You can wear one while you do your normal daily activities. While wearing this  monitor if you have any symptoms to push the button and record what you felt. Once you have worn this monitor for the period of time provider prescribed (Usually 14 days), you will return the monitor device in the postage paid box. Once it is returned they will download the data collected and provide us  with a report which the provider will then review and we will call you with those results. Important tips:  Avoid showering during the first 24 hours of wearing the monitor. Avoid excessive sweating to help maximize wear time. Do not submerge the device, no hot tubs, and no swimming pools. Keep any lotions or oils away from the patch. After 24 hours you may shower with the patch on. Take brief showers with your back facing the shower head.  Do not remove patch once it has been placed because that will interrupt data and decrease adhesive wear time. Push the button when you have any symptoms and write down what you were feeling. Once you have completed wearing your monitor, remove and place into box which has postage paid and place in your outgoing mailbox.  If for some reason you have misplaced your box then call our office and we can provide another box and/or mail it off for you.     Follow-Up: At [facility], you and your health needs are our priority.  As part of our continuing mission to provide you with exceptional heart care, our providers are all part of one team.  This team includes your primary Cardiologist (physician) and Advanced Practice Providers or APPs (Physician Assistants and Nurse Practitioners) who  all work together to provide you with the care you need, when you need it.  Your next appointment:   6 month(s)  Provider:   Cadence Franchester, PA-C    Your physician recommends that you schedule a follow-up appointment with EP in 2 weeks

## 2024-03-23 ENCOUNTER — Ambulatory Visit: Admitting: Medical

## 2024-03-23 LAB — MAGNESIUM: Magnesium: 2.2 mg/dL (ref 1.6–2.3)

## 2024-03-26 ENCOUNTER — Ambulatory Visit: Payer: Self-pay | Admitting: Medical

## 2024-04-11 ENCOUNTER — Ambulatory Visit: Admitting: Cardiology

## 2024-04-11 ENCOUNTER — Other Ambulatory Visit

## 2024-04-27 ENCOUNTER — Ambulatory Visit: Attending: Medical

## 2024-04-27 DIAGNOSIS — R55 Syncope and collapse: Secondary | ICD-10-CM

## 2024-04-27 DIAGNOSIS — I4719 Other supraventricular tachycardia: Secondary | ICD-10-CM | POA: Diagnosis not present

## 2024-04-27 LAB — ECHOCARDIOGRAM COMPLETE
AR max vel: 2.63 cm2
AV Area VTI: 2.43 cm2
AV Area mean vel: 2.56 cm2
AV Mean grad: 3 mmHg
AV Peak grad: 4.9 mmHg
Ao pk vel: 1.11 m/s
Area-P 1/2: 3.66 cm2
MV VTI: 3.14 cm2
S' Lateral: 4.1 cm

## 2024-04-27 MED ORDER — PERFLUTREN LIPID MICROSPHERE
1.0000 mL | INTRAVENOUS | Status: AC | PRN
  Administered 2024-04-27: 4 mL via INTRAVENOUS

## 2024-05-09 ENCOUNTER — Encounter: Payer: Self-pay | Admitting: Cardiology

## 2024-05-09 ENCOUNTER — Other Ambulatory Visit: Payer: Self-pay

## 2024-05-09 ENCOUNTER — Ambulatory Visit: Attending: Cardiology | Admitting: Cardiology

## 2024-05-09 VITALS — BP 100/60 | HR 103 | Ht 67.0 in | Wt 160.5 lb

## 2024-05-09 DIAGNOSIS — I4719 Other supraventricular tachycardia: Secondary | ICD-10-CM

## 2024-05-09 DIAGNOSIS — R000 Tachycardia, unspecified: Secondary | ICD-10-CM

## 2024-05-09 MED ORDER — LOSARTAN POTASSIUM 25 MG PO TABS: 25.0000 mg | ORAL_TABLET | Freq: Every day | ORAL | 3 refills | Status: AC

## 2024-05-09 NOTE — Progress Notes (Signed)
  Electrophysiology Office Follow up Visit Note:    Date:  05/09/2024   ID:  Bonnie Ryan, DOB 02/08/1956, MRN [id number]  PCP:  Sherial Bail, MD  [facility] Cardiologist:  Lonni Hanson, MD  [facility] Electrophysiologist:  OLE ONEIDA HOLTS, MD    Interval History:     Bonnie Ryan is a 68 y.o. female who presents for a follow up visit.   I last saw the patient April 28, 2022 for history of chronic systolic heart failure, mitral valve repair, lightheadedness. She last saw Bonnie Ryan in clinic March 22, 2024.  That appointment was after an ER visit on Jan 08, 2024 for loss of consciousness.  During that ER visit and ECG showed tachycardia at 105 bpm.  This was thought to be secondary to a urinary tract infection for which she was treated with ciprofloxacin .        Past medical, surgical, social and family history were reviewed.  ROS:   Please see the history of present illness.    All other systems reviewed and are negative.  EKGs/Labs/Other Studies Reviewed:    The following studies were reviewed today:  April 27, 2024 ZIO monitor personally reviewed Heart rate 70-1 88, average 101 Frequent PVCs, 6.4  April 27, 2024 echo EF 50-55 RV mildly reduced Trivial MR       Physical Exam:    VS:  BP 100/60 (BP Location: Left Arm, Patient Position: Sitting, Cuff Size: Normal)   Pulse (!) 103   Ht 5' 7 (1.702 m)   Wt 160 lb 8 oz (72.8 kg)   LMP  (LMP Unknown)   SpO2 96%   BMI 25.14 kg/m     Wt Readings from Last 3 Encounters:  05/09/24 160 lb 8 oz (72.8 kg)  03/22/24 160 lb (72.6 kg)  01/08/24 138 lb (62.6 kg)     GEN: no distress CARD: RRR, No MRG RESP: No IWOB. CTAB.      ASSESSMENT:    1. Atrial tachycardia (HCC)   2. Sinus tachycardia    PLAN:    In order of problems listed above:  #Sinus tachycardia #Palpitations I do not think her sinus tachycardia is causing her lightheadedness. In the past I have thoguht her  symptoms could be related to dysautonomia and I still think this may be the case.  It could be an ectopic atrial rhythm.  Unfortunately she would not tolerate additional nodal blockade given her relative hypotension.   I am going to check a TSH and FT4 to confirm this is not contributing. Continue metoprolol  twice daily as prescribed.  No additional EP testing is indicated at this time.  #History of mitral valve repair Hemodynamically stable.  Follow-up with general cardiology as scheduled.    Signed, OLE HOLTS, MD, [facility], [facility] 05/09/2024 2:44 PM    Electrophysiology [facility]

## 2024-05-09 NOTE — Patient Instructions (Addendum)
 Medication Instructions:  Your physician recommends that you continue on your current medications as directed. Please refer to the Current Medication list given to you today.  *If you need a refill on your cardiac medications before your next appointment, please call your pharmacy*  Labs:  TODAY: TSH and T4  Follow-Up: At [facility], you and your health needs are our priority.  As part of our continuing mission to provide you with exceptional heart care, our providers are all part of one team.  This team includes your primary Cardiologist (physician) and Advanced Practice Providers or APPs (Physician Assistants and Nurse Practitioners) who all work together to provide you with the care you need, when you need it.  Your next appointment:   1 year  Provider:   You will see one of the following Advanced Practice Providers on your designated Care Team:   Charlies Arthur, [place] Ozell Jodie Passey, PA-C Suzann Riddle, NP Daphne Barrack, NP Artist Pouch, PA-C

## 2024-05-10 LAB — T4, FREE: Free T4: 1.17 ng/dL (ref 0.82–1.77)

## 2024-05-10 LAB — TSH: TSH: 1.03 u[IU]/mL (ref 0.450–4.500)

## 2024-10-19 ENCOUNTER — Ambulatory Visit: Admitting: Medical
